# Patient Record
Sex: Male | Born: 1965 | Race: Black or African American | Hispanic: No | Marital: Married | Smoking: Never smoker
Health system: Southern US, Community
[De-identification: ages and names within clinical notes are randomized; demographics above are authoritative.]

## PROBLEM LIST (undated history)

## (undated) DIAGNOSIS — R0602 Shortness of breath: Secondary | ICD-10-CM

## (undated) DIAGNOSIS — I4430 Unspecified atrioventricular block: Secondary | ICD-10-CM

## (undated) DIAGNOSIS — M1990 Unspecified osteoarthritis, unspecified site: Secondary | ICD-10-CM

## (undated) DIAGNOSIS — Z87442 Personal history of urinary calculi: Secondary | ICD-10-CM

## (undated) DIAGNOSIS — G4733 Obstructive sleep apnea (adult) (pediatric): Secondary | ICD-10-CM

## (undated) DIAGNOSIS — K219 Gastro-esophageal reflux disease without esophagitis: Secondary | ICD-10-CM

## (undated) DIAGNOSIS — Z9989 Dependence on other enabling machines and devices: Secondary | ICD-10-CM

## (undated) DIAGNOSIS — I480 Paroxysmal atrial fibrillation: Secondary | ICD-10-CM

## (undated) DIAGNOSIS — E119 Type 2 diabetes mellitus without complications: Secondary | ICD-10-CM

## (undated) DIAGNOSIS — I10 Essential (primary) hypertension: Secondary | ICD-10-CM

## (undated) DIAGNOSIS — R079 Chest pain, unspecified: Secondary | ICD-10-CM

## (undated) DIAGNOSIS — Z8739 Personal history of other diseases of the musculoskeletal system and connective tissue: Secondary | ICD-10-CM

## (undated) DIAGNOSIS — N529 Male erectile dysfunction, unspecified: Secondary | ICD-10-CM

## (undated) DIAGNOSIS — I2510 Atherosclerotic heart disease of native coronary artery without angina pectoris: Secondary | ICD-10-CM

## (undated) DIAGNOSIS — E669 Obesity, unspecified: Secondary | ICD-10-CM

## (undated) DIAGNOSIS — E785 Hyperlipidemia, unspecified: Secondary | ICD-10-CM

## (undated) DIAGNOSIS — I214 Non-ST elevation (NSTEMI) myocardial infarction: Secondary | ICD-10-CM

## (undated) HISTORY — DX: Male erectile dysfunction, unspecified: N52.9

## (undated) HISTORY — PX: INGUINAL HERNIA REPAIR: SUR1180

## (undated) HISTORY — DX: Type 2 diabetes mellitus without complications: E11.9

## (undated) HISTORY — DX: Atherosclerotic heart disease of native coronary artery without angina pectoris: I25.10

## (undated) HISTORY — DX: Hyperlipidemia, unspecified: E78.5

## (undated) HISTORY — DX: Unspecified atrioventricular block: I44.30

## (undated) HISTORY — DX: Non-ST elevation (NSTEMI) myocardial infarction: I21.4

## (undated) HISTORY — DX: Chest pain, unspecified: R07.9

## (undated) HISTORY — PX: KNEE ARTHROSCOPY: SHX127

## (undated) HISTORY — DX: Essential (primary) hypertension: I10

## (undated) HISTORY — DX: Obesity, unspecified: E66.9

---
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
## 1998-10-28 ENCOUNTER — Emergency Department (HOSPITAL_COMMUNITY): Admission: EM | Admit: 1998-10-28 | Discharge: 1998-10-28 | Payer: Self-pay | Admitting: Emergency Medicine

## 1998-10-28 ENCOUNTER — Encounter: Payer: Self-pay | Admitting: Emergency Medicine

## 1998-10-31 ENCOUNTER — Ambulatory Visit (HOSPITAL_COMMUNITY): Admission: RE | Admit: 1998-10-31 | Discharge: 1998-10-31 | Payer: Self-pay | Admitting: Emergency Medicine

## 1998-10-31 ENCOUNTER — Encounter: Payer: Self-pay | Admitting: Emergency Medicine

## 1998-12-03 ENCOUNTER — Encounter: Payer: Self-pay | Admitting: Family Medicine

## 1998-12-03 ENCOUNTER — Ambulatory Visit (HOSPITAL_COMMUNITY): Admission: RE | Admit: 1998-12-03 | Discharge: 1998-12-03 | Payer: Self-pay | Admitting: Family Medicine

## 2000-04-13 ENCOUNTER — Encounter: Payer: Self-pay | Admitting: Family Medicine

## 2000-04-13 ENCOUNTER — Ambulatory Visit (HOSPITAL_COMMUNITY): Admission: RE | Admit: 2000-04-13 | Discharge: 2000-04-13 | Payer: Self-pay | Admitting: Family Medicine

## 2001-05-12 ENCOUNTER — Observation Stay (HOSPITAL_COMMUNITY): Admission: EM | Admit: 2001-05-12 | Discharge: 2001-05-13 | Payer: Self-pay | Admitting: Emergency Medicine

## 2001-05-12 ENCOUNTER — Encounter: Payer: Self-pay | Admitting: Emergency Medicine

## 2001-07-01 ENCOUNTER — Emergency Department (HOSPITAL_COMMUNITY): Admission: EM | Admit: 2001-07-01 | Discharge: 2001-07-01 | Payer: Self-pay | Admitting: *Deleted

## 2003-01-16 ENCOUNTER — Ambulatory Visit (HOSPITAL_BASED_OUTPATIENT_CLINIC_OR_DEPARTMENT_OTHER): Admission: RE | Admit: 2003-01-16 | Discharge: 2003-01-16 | Payer: Self-pay | Admitting: Pulmonary Disease

## 2003-10-27 ENCOUNTER — Ambulatory Visit (HOSPITAL_COMMUNITY): Admission: RE | Admit: 2003-10-27 | Discharge: 2003-10-27 | Payer: Self-pay | Admitting: Family Medicine

## 2003-12-23 ENCOUNTER — Observation Stay (HOSPITAL_COMMUNITY): Admission: RE | Admit: 2003-12-23 | Discharge: 2003-12-24 | Payer: Self-pay | Admitting: Orthopedic Surgery

## 2004-10-28 ENCOUNTER — Ambulatory Visit: Payer: Self-pay | Admitting: Family Medicine

## 2004-10-28 ENCOUNTER — Ambulatory Visit: Payer: Self-pay | Admitting: Cardiology

## 2004-10-29 ENCOUNTER — Inpatient Hospital Stay (HOSPITAL_COMMUNITY): Admission: EM | Admit: 2004-10-29 | Discharge: 2004-10-29 | Payer: Self-pay | Admitting: Emergency Medicine

## 2004-11-01 ENCOUNTER — Ambulatory Visit: Payer: Self-pay | Admitting: Cardiology

## 2004-11-10 ENCOUNTER — Ambulatory Visit: Payer: Self-pay | Admitting: Internal Medicine

## 2004-11-15 ENCOUNTER — Ambulatory Visit: Payer: Self-pay

## 2004-11-15 ENCOUNTER — Ambulatory Visit: Payer: Self-pay | Admitting: Cardiology

## 2004-11-17 ENCOUNTER — Ambulatory Visit: Payer: Self-pay | Admitting: Internal Medicine

## 2004-11-24 ENCOUNTER — Ambulatory Visit: Payer: Self-pay | Admitting: Internal Medicine

## 2004-11-29 ENCOUNTER — Ambulatory Visit: Payer: Self-pay | Admitting: Cardiology

## 2004-12-05 ENCOUNTER — Ambulatory Visit: Payer: Self-pay | Admitting: Cardiology

## 2005-02-15 ENCOUNTER — Ambulatory Visit: Payer: Self-pay | Admitting: Cardiology

## 2005-03-09 ENCOUNTER — Ambulatory Visit: Payer: Self-pay | Admitting: Internal Medicine

## 2005-05-09 ENCOUNTER — Ambulatory Visit: Payer: Self-pay | Admitting: Family Medicine

## 2005-07-13 ENCOUNTER — Ambulatory Visit: Payer: Self-pay | Admitting: Family Medicine

## 2005-08-15 ENCOUNTER — Ambulatory Visit: Payer: Self-pay | Admitting: Cardiology

## 2005-09-03 ENCOUNTER — Emergency Department (HOSPITAL_COMMUNITY): Admission: EM | Admit: 2005-09-03 | Discharge: 2005-09-03 | Payer: Self-pay | Admitting: Family Medicine

## 2005-10-03 ENCOUNTER — Ambulatory Visit: Payer: Self-pay | Admitting: Family Medicine

## 2006-10-17 ENCOUNTER — Ambulatory Visit: Payer: Self-pay | Admitting: Family Medicine

## 2006-10-17 DIAGNOSIS — E785 Hyperlipidemia, unspecified: Secondary | ICD-10-CM

## 2006-10-17 DIAGNOSIS — E119 Type 2 diabetes mellitus without complications: Secondary | ICD-10-CM

## 2006-10-17 DIAGNOSIS — I10 Essential (primary) hypertension: Secondary | ICD-10-CM

## 2006-10-17 DIAGNOSIS — E669 Obesity, unspecified: Secondary | ICD-10-CM

## 2006-10-17 HISTORY — DX: Essential (primary) hypertension: I10

## 2006-10-17 HISTORY — DX: Obesity, unspecified: E66.9

## 2006-10-17 HISTORY — DX: Hyperlipidemia, unspecified: E78.5

## 2006-10-17 HISTORY — DX: Type 2 diabetes mellitus without complications: E11.9

## 2006-10-17 LAB — CONVERTED CEMR LAB
ALT: 24 units/L (ref 0–53)
AST: 22 units/L (ref 0–37)
Albumin: 4.2 g/dL (ref 3.5–5.2)
Alkaline Phosphatase: 67 units/L (ref 39–117)
BUN: 13 mg/dL (ref 6–23)
Basophils Absolute: 0 10*3/uL (ref 0.0–0.1)
Basophils Relative: 0.5 % (ref 0.0–1.0)
Bilirubin, Direct: 0.1 mg/dL (ref 0.0–0.3)
CO2: 31 meq/L (ref 19–32)
Calcium: 9.2 mg/dL (ref 8.4–10.5)
Chloride: 102 meq/L (ref 96–112)
Cholesterol: 182 mg/dL (ref 0–200)
Creatinine, Ser: 0.8 mg/dL (ref 0.4–1.5)
Eosinophils Absolute: 0.3 10*3/uL (ref 0.0–0.6)
Eosinophils Relative: 4.5 % (ref 0.0–5.0)
GFR calc Af Amer: 138 mL/min
GFR calc non Af Amer: 114 mL/min
Glucose, Bld: 138 mg/dL — ABNORMAL HIGH (ref 70–99)
HCT: 40.5 % (ref 39.0–52.0)
HDL: 33.8 mg/dL — ABNORMAL LOW (ref >39.0)
Hemoglobin: 13.7 g/dL (ref 13.0–17.0)
Hgb A1c MFr Bld: 7.3 % — ABNORMAL HIGH (ref 4.6–6.0)
LDL Cholesterol: 125 mg/dL — ABNORMAL HIGH (ref 0–99)
Lymphocytes Relative: 38 % (ref 12.0–46.0)
MCHC: 33.8 g/dL (ref 30.0–36.0)
MCV: 80.2 fL (ref 78.0–100.0)
Monocytes Absolute: 0.7 10*3/uL (ref 0.2–0.7)
Monocytes Relative: 10.2 % (ref 3.0–11.0)
Neutro Abs: 3.1 10*3/uL (ref 1.4–7.7)
Neutrophils Relative %: 46.8 % (ref 43.0–77.0)
Platelets: 272 10*3/uL (ref 150–400)
Potassium: 3.7 meq/L (ref 3.5–5.1)
RBC: 5.05 M/uL (ref 4.22–5.81)
RDW: 14.3 % (ref 11.5–14.6)
Sodium: 140 meq/L (ref 135–145)
TSH: 1.25 microintl units/mL (ref 0.35–5.50)
Total Bilirubin: 0.6 mg/dL (ref 0.3–1.2)
Total CHOL/HDL Ratio: 5.4
Total Protein: 7.4 g/dL (ref 6.0–8.3)
Triglycerides: 117 mg/dL (ref 0–149)
VLDL: 23 mg/dL (ref 0–40)
WBC: 6.6 10*3/uL (ref 4.5–10.5)

## 2006-11-27 ENCOUNTER — Ambulatory Visit: Payer: Self-pay | Admitting: Family Medicine

## 2007-07-25 ENCOUNTER — Emergency Department (HOSPITAL_COMMUNITY): Admission: EM | Admit: 2007-07-25 | Discharge: 2007-07-25 | Payer: Self-pay | Admitting: Emergency Medicine

## 2007-11-04 ENCOUNTER — Telehealth: Payer: Self-pay | Admitting: Family Medicine

## 2007-11-26 ENCOUNTER — Ambulatory Visit: Payer: Self-pay | Admitting: Family Medicine

## 2007-11-26 LAB — CONVERTED CEMR LAB
ALT: 29 units/L (ref 0–53)
AST: 23 units/L (ref 0–37)
Albumin: 4.1 g/dL (ref 3.5–5.2)
Alkaline Phosphatase: 65 units/L (ref 39–117)
BUN: 10 mg/dL (ref 6–23)
Basophils Absolute: 0.1 10*3/uL (ref 0.0–0.1)
Basophils Relative: 1.4 % (ref 0.0–3.0)
Bilirubin, Direct: 0.1 mg/dL (ref 0.0–0.3)
CO2: 31 meq/L (ref 19–32)
Calcium: 9 mg/dL (ref 8.4–10.5)
Chloride: 106 meq/L (ref 96–112)
Creatinine, Ser: 0.7 mg/dL (ref 0.4–1.5)
Eosinophils Absolute: 0.2 10*3/uL (ref 0.0–0.7)
Eosinophils Relative: 4 % (ref 0.0–5.0)
GFR calc Af Amer: 161 mL/min
GFR calc non Af Amer: 133 mL/min
Glucose, Bld: 153 mg/dL — ABNORMAL HIGH (ref 70–99)
HCT: 39 % (ref 39.0–52.0)
Hemoglobin: 13.3 g/dL (ref 13.0–17.0)
Hgb A1c MFr Bld: 8.8 % — ABNORMAL HIGH (ref 4.6–6.0)
Lymphocytes Relative: 31.4 % (ref 12.0–46.0)
MCHC: 34.1 g/dL (ref 30.0–36.0)
MCV: 82.2 fL (ref 78.0–100.0)
Monocytes Absolute: 0.5 10*3/uL (ref 0.1–1.0)
Monocytes Relative: 8.2 % (ref 3.0–12.0)
Neutro Abs: 3.1 10*3/uL (ref 1.4–7.7)
Neutrophils Relative %: 55 % (ref 43.0–77.0)
Platelets: 280 10*3/uL (ref 150–400)
Potassium: 3.5 meq/L (ref 3.5–5.1)
RBC: 4.74 M/uL (ref 4.22–5.81)
RDW: 14 % (ref 11.5–14.6)
Sodium: 142 meq/L (ref 135–145)
TSH: 1.39 microintl units/mL (ref 0.35–5.50)
Total Bilirubin: 0.7 mg/dL (ref 0.3–1.2)
Total Protein: 7.3 g/dL (ref 6.0–8.3)
WBC: 5.7 10*3/uL (ref 4.5–10.5)

## 2007-11-29 ENCOUNTER — Telehealth: Payer: Self-pay | Admitting: Family Medicine

## 2007-12-06 ENCOUNTER — Ambulatory Visit: Payer: Self-pay | Admitting: Family Medicine

## 2008-01-03 ENCOUNTER — Ambulatory Visit: Payer: Self-pay | Admitting: Family Medicine

## 2008-01-03 LAB — CONVERTED CEMR LAB
BUN: 16 mg/dL (ref 6–23)
CO2: 31 meq/L (ref 19–32)
Calcium: 9.1 mg/dL (ref 8.4–10.5)
Chloride: 104 meq/L (ref 96–112)
Creatinine, Ser: 0.9 mg/dL (ref 0.4–1.5)
Creatinine,U: 61.5 mg/dL
GFR calc Af Amer: 120 mL/min
GFR calc non Af Amer: 99 mL/min
Glucose, Bld: 111 mg/dL — ABNORMAL HIGH (ref 70–99)
Hgb A1c MFr Bld: 8.2 % — ABNORMAL HIGH (ref 4.6–6.0)
Microalb Creat Ratio: 27.6 mg/g (ref 0.0–30.0)
Microalb, Ur: 1.7 mg/dL (ref 0.0–1.9)
Potassium: 3.8 meq/L (ref 3.5–5.1)
Sodium: 143 meq/L (ref 135–145)

## 2008-01-07 ENCOUNTER — Telehealth: Payer: Self-pay | Admitting: *Deleted

## 2008-01-13 ENCOUNTER — Telehealth: Payer: Self-pay | Admitting: Family Medicine

## 2008-02-10 ENCOUNTER — Telehealth: Payer: Self-pay | Admitting: Family Medicine

## 2008-03-12 DIAGNOSIS — R071 Chest pain on breathing: Secondary | ICD-10-CM | POA: Insufficient documentation

## 2008-03-26 ENCOUNTER — Ambulatory Visit: Payer: Self-pay | Admitting: Family Medicine

## 2008-03-26 LAB — CONVERTED CEMR LAB: Hgb A1c MFr Bld: 7.5 % — ABNORMAL HIGH (ref 4.6–6.0)

## 2008-03-27 ENCOUNTER — Telehealth: Payer: Self-pay | Admitting: Family Medicine

## 2008-04-01 DIAGNOSIS — R197 Diarrhea, unspecified: Secondary | ICD-10-CM | POA: Insufficient documentation

## 2008-04-07 ENCOUNTER — Ambulatory Visit: Payer: Self-pay | Admitting: Family Medicine

## 2008-04-13 ENCOUNTER — Telehealth: Payer: Self-pay | Admitting: Family Medicine

## 2008-05-11 ENCOUNTER — Telehealth: Payer: Self-pay | Admitting: Family Medicine

## 2008-08-05 ENCOUNTER — Telehealth: Payer: Self-pay | Admitting: Family Medicine

## 2008-10-05 ENCOUNTER — Encounter: Payer: Self-pay | Admitting: Family Medicine

## 2008-12-13 LAB — HM DIABETES FOOT EXAM: HM Diabetic Foot Exam: NORMAL

## 2008-12-17 ENCOUNTER — Ambulatory Visit: Payer: Self-pay | Admitting: Family Medicine

## 2008-12-17 LAB — CONVERTED CEMR LAB
ALT: 21 units/L (ref 0–53)
AST: 22 units/L (ref 0–37)
Albumin: 4.4 g/dL (ref 3.5–5.2)
Alkaline Phosphatase: 58 units/L (ref 39–117)
BUN: 13 mg/dL (ref 6–23)
Basophils Absolute: 0.1 10*3/uL (ref 0.0–0.1)
Basophils Relative: 1.1 % (ref 0.0–3.0)
Bilirubin, Direct: 0.1 mg/dL (ref 0.0–0.3)
CO2: 22 meq/L (ref 19–32)
Calcium: 9.5 mg/dL (ref 8.4–10.5)
Chloride: 100 meq/L (ref 96–112)
Cholesterol: 161 mg/dL (ref 0–200)
Creatinine, Ser: 0.9 mg/dL (ref 0.4–1.5)
Creatinine,U: 318.1 mg/dL
Eosinophils Absolute: 0.3 10*3/uL (ref 0.0–0.7)
Eosinophils Relative: 5.8 % — ABNORMAL HIGH (ref 0.0–5.0)
GFR calc non Af Amer: 119.07 mL/min (ref >60)
Glucose, Bld: 87 mg/dL (ref 70–99)
HCT: 40.9 % (ref 39.0–52.0)
HDL: 28.9 mg/dL — ABNORMAL LOW (ref >39.00)
Hemoglobin: 13.8 g/dL (ref 13.0–17.0)
Hgb A1c MFr Bld: 6.7 % — ABNORMAL HIGH (ref 4.6–6.5)
LDL Cholesterol: 108 mg/dL — ABNORMAL HIGH (ref 0–99)
Lymphocytes Relative: 35.6 % (ref 12.0–46.0)
Lymphs Abs: 2 10*3/uL (ref 0.7–4.0)
MCHC: 33.7 g/dL (ref 30.0–36.0)
MCV: 83.3 fL (ref 78.0–100.0)
Microalb Creat Ratio: 18.2 mg/g (ref 0.0–30.0)
Microalb, Ur: 5.8 mg/dL — ABNORMAL HIGH (ref 0.0–1.9)
Monocytes Absolute: 0.5 10*3/uL (ref 0.1–1.0)
Monocytes Relative: 9.5 % (ref 3.0–12.0)
Neutro Abs: 2.6 10*3/uL (ref 1.4–7.7)
Neutrophils Relative %: 48 % (ref 43.0–77.0)
Platelets: 275 10*3/uL (ref 150.0–400.0)
Potassium: 4.5 meq/L (ref 3.5–5.1)
RBC: 4.91 M/uL (ref 4.22–5.81)
RDW: 14 % (ref 11.5–14.6)
Sodium: 137 meq/L (ref 135–145)
TSH: 1.18 microintl units/mL (ref 0.35–5.50)
Total Bilirubin: 0.5 mg/dL (ref 0.3–1.2)
Total CHOL/HDL Ratio: 6
Total Protein: 7 g/dL (ref 6.0–8.3)
Triglycerides: 123 mg/dL (ref 0.0–149.0)
VLDL: 24.6 mg/dL (ref 0.0–40.0)
WBC: 5.5 10*3/uL (ref 4.5–10.5)

## 2008-12-23 ENCOUNTER — Telehealth: Payer: Self-pay | Admitting: Family Medicine

## 2009-01-21 DIAGNOSIS — R059 Cough, unspecified: Secondary | ICD-10-CM | POA: Insufficient documentation

## 2009-01-21 DIAGNOSIS — R05 Cough: Secondary | ICD-10-CM

## 2009-03-04 ENCOUNTER — Emergency Department (HOSPITAL_COMMUNITY): Admission: EM | Admit: 2009-03-04 | Discharge: 2009-03-04 | Payer: Self-pay | Admitting: Emergency Medicine

## 2009-03-10 ENCOUNTER — Telehealth: Payer: Self-pay | Admitting: Family Medicine

## 2009-10-11 ENCOUNTER — Ambulatory Visit: Payer: Self-pay | Admitting: Family Medicine

## 2009-10-11 DIAGNOSIS — J309 Allergic rhinitis, unspecified: Secondary | ICD-10-CM | POA: Insufficient documentation

## 2009-10-11 DIAGNOSIS — J019 Acute sinusitis, unspecified: Secondary | ICD-10-CM | POA: Insufficient documentation

## 2009-12-08 ENCOUNTER — Ambulatory Visit: Payer: Self-pay | Admitting: Family Medicine

## 2009-12-08 LAB — CONVERTED CEMR LAB
ALT: 20 units/L (ref 0–53)
AST: 20 units/L (ref 0–37)
Albumin: 4.1 g/dL (ref 3.5–5.2)
Alkaline Phosphatase: 57 units/L (ref 39–117)
BUN: 15 mg/dL (ref 6–23)
Basophils Absolute: 0 10*3/uL (ref 0.0–0.1)
Basophils Relative: 0.7 % (ref 0.0–3.0)
Bilirubin Urine: NEGATIVE
Bilirubin, Direct: 0.1 mg/dL (ref 0.0–0.3)
CO2: 28 meq/L (ref 19–32)
Calcium: 9.2 mg/dL (ref 8.4–10.5)
Chloride: 105 meq/L (ref 96–112)
Cholesterol: 183 mg/dL (ref 0–200)
Creatinine, Ser: 0.8 mg/dL (ref 0.4–1.5)
Creatinine,U: 193.2 mg/dL
Eosinophils Absolute: 0.3 10*3/uL (ref 0.0–0.7)
Eosinophils Relative: 5.9 % — ABNORMAL HIGH (ref 0.0–5.0)
GFR calc non Af Amer: 130.13 mL/min (ref >60)
Glucose, Bld: 109 mg/dL — ABNORMAL HIGH (ref 70–99)
Glucose, Urine, Semiquant: NEGATIVE
HCT: 41 % (ref 39.0–52.0)
HDL: 39.3 mg/dL (ref >39.00)
Hemoglobin: 13.7 g/dL (ref 13.0–17.0)
Hgb A1c MFr Bld: 6.6 % — ABNORMAL HIGH (ref 4.6–6.5)
Ketones, urine, test strip: NEGATIVE
LDL Cholesterol: 130 mg/dL — ABNORMAL HIGH (ref 0–99)
Lymphocytes Relative: 37.8 % (ref 12.0–46.0)
Lymphs Abs: 2 10*3/uL (ref 0.7–4.0)
MCHC: 33.5 g/dL (ref 30.0–36.0)
MCV: 82.5 fL (ref 78.0–100.0)
Microalb Creat Ratio: 1 mg/g (ref 0.0–30.0)
Microalb, Ur: 1.9 mg/dL (ref 0.0–1.9)
Monocytes Absolute: 0.5 10*3/uL (ref 0.1–1.0)
Monocytes Relative: 10.2 % (ref 3.0–12.0)
Neutro Abs: 2.4 10*3/uL (ref 1.4–7.7)
Neutrophils Relative %: 45.4 % (ref 43.0–77.0)
Nitrite: NEGATIVE
PSA: 0.19 ng/mL (ref 0.10–4.00)
Platelets: 276 10*3/uL (ref 150.0–400.0)
Potassium: 4.2 meq/L (ref 3.5–5.1)
Protein, U semiquant: NEGATIVE
RBC: 4.97 M/uL (ref 4.22–5.81)
RDW: 15 % — ABNORMAL HIGH (ref 11.5–14.6)
Sodium: 140 meq/L (ref 135–145)
Specific Gravity, Urine: 1.02
TSH: 1.53 microintl units/mL (ref 0.35–5.50)
Total Bilirubin: 0.3 mg/dL (ref 0.3–1.2)
Total CHOL/HDL Ratio: 5
Total Protein: 7.4 g/dL (ref 6.0–8.3)
Triglycerides: 69 mg/dL (ref 0.0–149.0)
Urobilinogen, UA: 0.2
VLDL: 13.8 mg/dL (ref 0.0–40.0)
WBC Urine, dipstick: NEGATIVE
WBC: 5.3 10*3/uL (ref 4.5–10.5)
pH: 7

## 2009-12-11 LAB — HM DIABETES EYE EXAM

## 2009-12-13 ENCOUNTER — Ambulatory Visit: Payer: Self-pay | Admitting: Family Medicine

## 2009-12-13 ENCOUNTER — Encounter: Payer: Self-pay | Admitting: Family Medicine

## 2009-12-27 ENCOUNTER — Ambulatory Visit: Payer: Self-pay | Admitting: Family Medicine

## 2009-12-27 DIAGNOSIS — M25569 Pain in unspecified knee: Secondary | ICD-10-CM

## 2010-03-19 ENCOUNTER — Ambulatory Visit
Admission: RE | Admit: 2010-03-19 | Discharge: 2010-03-19 | Payer: Self-pay | Source: Home / Self Care | Attending: Family Medicine | Admitting: Family Medicine

## 2010-04-12 NOTE — Progress Notes (Signed)
  Phone Note Outgoing Call   Call placed by: jat Call placed to: Patient Summary of Call: called Raymond Lutz advised in his hemoglobin A1c is coming down, but not normal yet.  Continue the intensive diet, exercise program return in 3 months for nonfasting hemoglobin A1C Initial call taken by: Roderick Pee MD,  March 27, 2008 5:36 PM

## 2010-04-12 NOTE — Progress Notes (Signed)
Summary: simvastatin  Phone Note Refill Request   Refills Requested: Medication #1:  SIMVASTATIN 80 MG  TABS one daily Initial call taken by: Kern Reap CMA,  Aug 05, 2008 12:04 PM      Prescriptions: SIMVASTATIN 80 MG  TABS (SIMVASTATIN) one daily  #100 x 1   Entered by:   Kern Reap CMA   Authorized by:   Roderick Pee MD   Signed by:   Kern Reap CMA on 08/05/2008   Method used:   Electronically to        [facility]  [phone]* (retail)       [address]       [place], [place]  [address]       Ph: [phone] or [phone]       Fax: [phone]   RxID:   [id number]

## 2010-04-12 NOTE — Progress Notes (Signed)
Summary: diltiazem refill  Phone Note Refill Request Message from:  Fax from Pharmacy  Refills Requested: Medication #1:  DILTIAZEM HCL ER BEADS 420 MG  CP24 Take one by mouth daily Initial call taken by: Kern Reap CMA,  February 10, 2008 2:11 PM      Prescriptions: DILTIAZEM HCL ER BEADS 420 MG  CP24 (DILTIAZEM HCL ER BEADS) Take one by mouth daily  #100 x 0   Entered by:   Kern Reap CMA   Authorized by:   Roderick Pee MD   Signed by:   Kern Reap CMA on 02/10/2008   Method used:   Electronically to        [facility]  [phone]* (retail)       [address]       [place], [place]  [address]       Ph: [phone] or [phone]       Fax: [phone]   RxID:   [id number]

## 2010-04-12 NOTE — Assessment & Plan Note (Signed)
Summary: KNOT IN CHEST/JLS   Vital Signs:  Patient Profile:   45 Years Old Male Height:     74 inches (187.96 cm) Temp:     98.2 degrees F oral Pulse rate:   66 / minute Pulse rhythm:   regular BP sitting:   160 / 88  (left arm) Cuff size:   large  Vitals Entered By: Kern Reap CMA (March 26, 2008 2:36 PM) Weight > 350 lbs. Yes                 Chief Complaint:  lump on chest.  History of Present Illness: Raymond Lutz is a 45 year old male, who comes in today for evaluation of tenderness in his xiphoid process.  Two weeks ago he noticed some tenderness there no trauma.  Is otherwise been well.  He reports his weight is now under 400 pounds.  He continues to follow his diet and exercise program.  Blood pressure from 137/80.  Blood sugar fasting 115    Prior Medication List:  FUROSEMIDE 40 MG  TABS (FUROSEMIDE) 2 by mouth qam SIMVASTATIN 80 MG  TABS (SIMVASTATIN) one daily POTASSIUM CHLORIDE CRYS CR 20 MEQ  TBCR (POTASSIUM CHLORIDE CRYS CR) 2 by mouth qam DILTIAZEM HCL ER BEADS 420 MG  CP24 (DILTIAZEM HCL ER BEADS) Take one by mouth daily CIALIS 20 MG  TABS (TADALAFIL) prn VENTOLIN HFA 108 (90 BASE) MCG/ACT  AERS (ALBUTEROL SULFATE) 2 ps three times a day prn GLUCOPHAGE 500 MG TABS (METFORMIN HCL) Take 1 tablet by mouth two times a day ACCU-CHEK COMPACT TEST DRUM  STRP (GLUCOSE BLOOD) test 3 times a week   Current Allergies: No known allergies   Past Medical History:    Reviewed history from 01/03/2008 and no changes required:       Diabetes mellitus, type II       Hyperlipidemia       Hypertension       ASTHMA, CHILDHOOD (ICD-493.00)       OBESITY (ICD-278.00)       Peripheral vascular disease       ED   Social History:    Reviewed history from 12/06/2007 and no changes required:       Occupation:       Married       Never Smoked       Alcohol use-no       Drug use-no       Regular exercise-yes    Review of Systems      See HPI   Physical  Exam  General:     Well-developed,well-nourished,in no acute distress; alert,appropriate and cooperative throughout examination Chest Wall:     tender xiphoid process    Impression & Recommendations:  Problem # 1:  CHEST WALL PAIN, ACUTE (JAS-505.39) Assessment: New  Complete Medication List: 1)  Furosemide 40 Mg Tabs (Furosemide) .... 2 by mouth qam 2)  Simvastatin 80 Mg Tabs (Simvastatin) .... One daily 3)  Potassium Chloride Crys Cr 20 Meq Tbcr (Potassium chloride crys cr) .... 2 by mouth qam 4)  Diltiazem Hcl Er Beads 420 Mg Cp24 (Diltiazem hcl er beads) .... Take one by mouth daily 5)  Cialis 20 Mg Tabs (Tadalafil) .... Prn 6)  Ventolin Hfa 108 (90 Base) Mcg/act Aers (Albuterol sulfate) .... 2 ps three times a day prn 7)  Glucophage 500 Mg Tabs (Metformin hcl) .... Take 1 tablet by mouth two times a day 8)  Accu-chek Compact Test Drum Strp (Glucose blood) .... Test  3 times a week  Other Orders: Venipuncture ([address]) TLB-A1C / Hgb A1C (Glycohemoglobin) (83036-A1C)   Patient Instructions: 1)  take 600 mg of Motrin up to 3 times a day with food as needed for the chest wall pain.  Return p.r.n.

## 2010-04-12 NOTE — Assessment & Plan Note (Signed)
Summary: cpx//ccm   Vital Signs:  Patient profile:   45 year old male Height:      72 inches Weight:      365 pounds BMI:     49.68 Temp:     98.4 degrees F oral BP sitting:   130 / 84  (left arm) Cuff size:   large  Vitals Entered By: Kern Reap CMA Duncan Dull) (December 13, 2009 2:36 PM)  CC: cpx Is Patient Diabetic? Yes Did you bring your meter with you today? No Pain Assessment Patient in pain? no        CC:  cpx.  History of Present Illness: Raymond Lutz is a 45 year old, married male, nonsmoker, who comes in today for evaluation of hypertension, hyperlipidemia, diabetes, occasional asthma, and erectile dysfunction.  His hypertension is treated with Cardizem 420 mg daily, Lasix, 80 mg q.a.m., BP 130/84.  His hyperlipidemia, history of Lipitor 20 mg nightly, Lopressor ago.  His diabetes is to use Glucophage 500 mg b.i.d. blood sugar 109.  A1c6 .6%.  His erectile dysfunction is treated with Cialis 20 mg p.r.n. with good results.  He only used an occasional Ventolin as needed and he takes a potassium supplement two tabs daily potassium level normal 4.2.  He gets routine eye care, dental care, tetanus, 2004, Pneumovax 2005.  Pneumothorax in seasonal flu shot today.  He is not an ACE inhibitor because he developed a cough  Allergies: No Known Drug Allergies  Past History:  Past medical, surgical, family and social histories (including risk factors) reviewed, and no changes noted (except as noted below).  Past Medical History: Reviewed history from 01/03/2008 and no changes required. Diabetes mellitus, type II Hyperlipidemia Hypertension ASTHMA, CHILDHOOD (ICD-493.00) OBESITY (ICD-278.00) Peripheral vascular disease ED  Past Surgical History: Reviewed history from 10/17/2006 and no changes required. Hernia Repair  Family History: Reviewed history from 12/06/2007 and no changes required. Family History Diabetes 1st degree relative Family History  Hypertension  Social History: Reviewed history from 12/06/2007 and no changes required. Occupation: Married Never Smoked Alcohol use-no Drug use-no Regular exercise-yes  Review of Systems      See HPI  Physical Exam  General:  Well-developed,well-nourished,in no acute distress; alert,appropriate and cooperative throughout examination Head:  Normocephalic and atraumatic without obvious abnormalities. No apparent alopecia or balding. Eyes:  exam normal, except for bilateral cataracts Ears:  External ear exam shows no significant lesions or deformities.  Otoscopic examination reveals clear canals, tympanic membranes are intact bilaterally without bulging, retraction, inflammation or discharge. Hearing is grossly normal bilaterally. Nose:  External nasal examination shows no deformity or inflammation. Nasal mucosa are pink and moist without lesions or exudates. Mouth:  Oral mucosa and oropharynx without lesions or exudates.  Teeth in good repair. Neck:  No deformities, masses, or tenderness noted. Chest Wall:  No deformities, masses, tenderness or gynecomastia noted. Breasts:  No masses or gynecomastia noted Lungs:  Normal respiratory effort, chest expands symmetrically. Lungs are clear to auscultation, no crackles or wheezes. Heart:  Normal rate and regular rhythm. S1 and S2 normal without gallop, murmur, click, rub or other extra sounds. Msk:  No deformity or scoliosis noted of thoracic or lumbar spine.   Pulses:  R and L carotid,radial,femoral,dorsalis pedis and posterior tibial pulses are full and equal bilaterally Extremities:  No clubbing, cyanosis, edema, or deformity noted with normal full range of motion of all joints.   Neurologic:  No cranial nerve deficits noted. Station and gait are normal. Plantar reflexes are down-going bilaterally.  DTRs are symmetrical throughout. Sensory, motor and coordinative functions appear intact. Skin:  Intact without suspicious lesions or  rashes Cervical Nodes:  No lymphadenopathy noted Axillary Nodes:  No palpable lymphadenopathy Inguinal Nodes:  No significant adenopathy Psych:  Cognition and judgment appear intact. Alert and cooperative with normal attention span and concentration. No apparent delusions, illusions, hallucinations  Diabetes Management Exam:    Foot Exam (with socks and/or shoes not present):       Sensory-Pinprick/Light touch:          Left medial foot (L-4): normal          Left dorsal foot (L-5): normal          Left lateral foot (S-1): normal          Right medial foot (L-4): normal          Right dorsal foot (L-5): normal          Right lateral foot (S-1): normal       Sensory-Monofilament:          Left foot: normal          Right foot: normal       Inspection:          Left foot: normal          Right foot: normal       Nails:          Left foot: normal          Right foot: normal    Eye Exam:       Eye Exam done elsewhere          Date: 12/11/2008          Results: normal          Done by: [facility]   Impression & Recommendations:  Problem # 1:  OBESITY (ICD-278.00) Assessment Unchanged  Orders: Prescription Created Electronically [phone])  Problem # 2:  HYPERTENSION (ICD-401.9) Assessment: Improved  His updated medication list for this problem includes:    Furosemide 40 Mg Tabs (Furosemide) .Marland Kitchen... 2 by mouth qam    Diltiazem Hcl Er Beads 420 Mg Cp24 (Diltiazem hcl er beads) .Marland Kitchen... Take one by mouth daily  Orders: Prescription Created Electronically [phone]) EKG w/ Interpretation (93000)  Problem # 3:  HYPERLIPIDEMIA (ICD-272.4) Assessment: Improved  His updated medication list for this problem includes:    Lipitor 20 Mg Tabs (Atorvastatin calcium) .Marland Kitchen... 1 tab @ bedtime  Orders: Prescription Created Electronically [id number]) EKG w/ Interpretation ([address])  Problem # 4:  DIABETES MELLITUS, TYPE II (ICD-250.00) Assessment: Improved  His updated medication list for  this problem includes:    Glucophage 500 Mg Tabs (Metformin hcl) .Marland Kitchen... Take 1 tablet by mouth two times a day  Orders: Prescription Created Electronically [phone])  Problem # 5:  Preventive Health Care (ICD-V70.0) Assessment: Unchanged  Complete Medication List: 1)  Furosemide 40 Mg Tabs (Furosemide) .... 2 by mouth qam 2)  Potassium Chloride Crys Cr 20 Meq Tbcr (Potassium chloride crys cr) .... 2 by mouth qam 3)  Diltiazem Hcl Er Beads 420 Mg Cp24 (Diltiazem hcl er beads) .... Take one by mouth daily 4)  Cialis 20 Mg Tabs (Tadalafil) .... Prn 5)  Ventolin Hfa 108 (90 Base) Mcg/act Aers (Albuterol sulfate) .... 2 ps three times a day prn 6)  Glucophage 500 Mg Tabs (Metformin hcl) .... Take 1 tablet by mouth two times a day 7)  Accu-chek Compact Test Drum Strp (Glucose blood) .Marland KitchenMarland KitchenMarland Kitchen  Test 3 times a week 8)  Hydromet 5-1.5 Mg/38ml Syrp (Hydrocodone-homatropine) .Marland Kitchen.. 1 or 2 tsp at bedtime as needed 9)  Lipitor 20 Mg Tabs (Atorvastatin calcium) .Marland Kitchen.. 1 tab @ bedtime  Other Orders: Flu Vaccine 64yrs + (14782) Admin 1st Vaccine (95621) Pneumococcal Vaccine (30865) Admin of Any Addtl Vaccine (78469)  Patient Instructions: 1)  see Dr. Mia Creek, ophthalmologist for your next exam. 2)  It is important that you exercise regularly at least 20 minutes 5 times a week. If you develop chest pain, have severe difficulty breathing, or feel very tired , stop exercising immediately and seek medical attention. 3)  You need to lose weight. Consider a lower calorie diet and regular exercise.  4)  Take an Aspirin every day. 5)  Check your blood sugars regularly. If your readings are usually above : or below 70 you should contact our office. 6)  It is important that your Diabetic A1c level is checked every 3 months. 7)  See your eye doctor yearly to check for diabetic eye damage. 8)  Check your feet each night for sore areas, calluses or signs of infection. 9)  Check your Blood Pressure regularly. If it is  above: you should make an appointment. 10)  Please schedule a follow-up appointment in 6 months.,250.00 11)  BMP prior to visit, ICD-9: 12)  HbgA1C prior to visit, ICD-9: Prescriptions: LIPITOR 20 MG TABS (ATORVASTATIN CALCIUM) 1 tab @ bedtime  #100 x 3   Entered and Authorized by:   Roderick Pee MD   Signed by:   Roderick Pee MD on 12/13/2009   Method used:   Electronically to        [facility]  [phone]* (retail)       [address]       [place], [place]  [address]       Ph: [phone] or [phone]       Fax: [phone]   RxID:   [id number] ACCU-CHEK COMPACT TEST DRUM  STRP (GLUCOSE BLOOD) test 3 times a week  #90 x 3   Entered and Authorized by:   Roderick Pee MD   Signed by:   Roderick Pee MD on 12/13/2009   Method used:   Electronically to        [facility]  [phone]* (retail)       [address]       [place], [place]  [address]       Ph: [phone] or [phone]       Fax: [phone]   RxID:   [id number] GLUCOPHAGE 500 MG TABS (METFORMIN HCL) Take 1 tablet by mouth two times a day  #200 Each x 3   Entered and Authorized by:   Roderick Pee MD   Signed by:   Roderick Pee MD on 12/13/2009   Method used:   Electronically to        [facility]  [phone]* (retail)       [address]       [place], [place]  [address]       Ph: [phone] or [phone]       Fax: [phone]   RxID:   [id number] VENTOLIN HFA 108 (90 BASE) MCG/ACT  AERS (ALBUTEROL SULFATE) 2 ps three times a day prn  #1 x 1  Entered and Authorized by:   Roderick Pee MD   Signed by:   Roderick Pee MD on 12/13/2009   Method used:   Electronically to        [facility]  [phone]* (retail)       [address]       [place], [place]  [address]       Ph: [phone] or [phone]       Fax: [phone]   RxID:    [id number] CIALIS 20 MG  TABS (TADALAFIL) prn  #6 Each x 11   Entered and Authorized by:   Roderick Pee MD   Signed by:   Roderick Pee MD on 12/13/2009   Method used:   Electronically to        [facility]  [phone]* (retail)       [address]       [place], [place]  [address]       Ph: [phone] or [phone]       Fax: [phone]   RxID:   [id number] DILTIAZEM HCL ER BEADS 420 MG  CP24 (DILTIAZEM HCL ER BEADS) Take one by mouth daily  #100 x 3   Entered and Authorized by:   Roderick Pee MD   Signed by:   Roderick Pee MD on 12/13/2009   Method used:   Electronically to        [facility]  [phone]* (retail)       [address]       [place], [place]  [address]       Ph: [phone] or [phone]       Fax: [phone]   RxID:   [id number] POTASSIUM CHLORIDE CRYS CR 20 MEQ  TBCR (POTASSIUM CHLORIDE CRYS CR) 2 by mouth qam  #200 Each x 3   Entered and Authorized by:   Roderick Pee MD   Signed by:   Roderick Pee MD on 12/13/2009   Method used:   Electronically to        [facility]  [phone]* (retail)       [address]       [place], [place]  [address]       Ph: [phone] or [phone]       Fax: [phone]   RxID:   [id number] FUROSEMIDE 40 MG  TABS (FUROSEMIDE) 2 by mouth qam  #200 Each x 3   Entered and Authorized by:   Roderick Pee MD   Signed by:   Roderick Pee MD on 12/13/2009   Method used:   Electronically to        [facility]  [phone]* (retail)       [address]       [place], [place]  [address]       Ph: [phone] or [phone]       Fax: [phone]   RxID:   [id number]    Immunizations Administered:  Influenza Vaccine # 1:    Vaccine Type: Fluvax 3+    Site: left deltoid    Mfr: Merck    Dose: 0.5 ml    Route: IM    Given by: Kern Reap CMA  (  AAMA)    Exp. Date: 09/10/2010    Lot #: [id number]    VIS given: 10/05/09 version given December 13, 2009.    Physician counseled: yes  Pneumonia Vaccine:    Vaccine Type: Pneumovax    Site: right deltoid    Mfr: Merck    Dose: 0.5 ml    Route: IM    Given by: Kern Reap CMA (AAMA)    Exp. Date: 05/30/2011    Lot #: [id number]    Physician counseled: yes

## 2010-04-12 NOTE — Assessment & Plan Note (Signed)
Summary: knee pain/njr   Vital Signs:  Patient profile:   45 year old male Temp:     98.0 degrees F oral BP sitting:   130 / 80  (left arm) Cuff size:   large  Vitals Entered By: Kern Reap CMA Duncan Dull) (December 27, 2009 10:22 AM) CC: left knee pain Is Patient Diabetic? Yes Did you bring your meter with you today? No Pain Assessment Patient in pain? yes     Location: knee Intensity: 5 Type: sharp Onset of pain  Sudden   CC:  left knee pain.  History of Present Illness: Raymond Lutz is a 45 y/o male  whoslipped at work last thursday........11/13.......and twisted his left knee.It was  sore,but the next day was extremely swollen too.  now it is very swollen and painful.  He cannot bear weight.  I made an appointment at 9 a.m. Tuesday morning to see Dr. Madelon Lips,  Allergies: No Known Drug Allergies  Past History:  Past medical, surgical, family and social histories (including risk factors) reviewed for relevance to current acute and chronic problems.  Past Medical History: Reviewed history from 01/03/2008 and no changes required. Diabetes mellitus, type II Hyperlipidemia Hypertension ASTHMA, CHILDHOOD (ICD-493.00) OBESITY (ICD-278.00) Peripheral vascular disease ED  Past Surgical History: Reviewed history from 10/17/2006 and no changes required. Hernia Repair  Family History: Reviewed history from 12/06/2007 and no changes required. Family History Diabetes 1st degree relative Family History Hypertension  Social History: Reviewed history from 12/06/2007 and no changes required. Occupation: Married Never Smoked Alcohol use-no Drug use-no Regular exercise-yes  Review of Systems      See HPI  Physical Exam  General:  Well-developed,well-nourished,in no acute distress; alert,appropriate and cooperative throughout examination Msk:  the left knee is swollen with tenderness medially and laterally.   Impression & Recommendations:  Problem # 1:  KNEE PAIN,  LEFT, ACUTE (ICD-719.46) Assessment New  His updated medication list for this problem includes:    Vicodin Es 7.5-750 Mg Tabs (Hydrocodone-acetaminophen) .Marland Kitchen... Take 1 tablet by mouth four times a day as needed pain  Complete Medication List: 1)  Potassium Chloride Crys Cr 20 Meq Tbcr (Potassium chloride crys cr) .... 2 by mouth qam 2)  Diltiazem Hcl Er Beads 420 Mg Cp24 (Diltiazem hcl er beads) .... Take one by mouth daily 3)  Cialis 20 Mg Tabs (Tadalafil) .... Prn 4)  Ventolin Hfa 108 (90 Base) Mcg/act Aers (Albuterol sulfate) .... 2 ps three times a day prn 5)  Glucophage 500 Mg Tabs (Metformin hcl) .... Take 1 tablet by mouth two times a day 6)  Accu-chek Compact Test Drum Strp (Glucose blood) .... Test 3 times a week 7)  Lipitor 20 Mg Tabs (Atorvastatin calcium) .Marland Kitchen.. 1 tab @ bedtime 8)  Vicodin Es 7.5-750 Mg Tabs (Hydrocodone-acetaminophen) .... Take 1 tablet by mouth four times a day as needed pain  Patient Instructions: 1)  stay at bed rest at home today see Dr. Madelon Lips tomorrow morning at 9 a.m. at [facility] ortho on [address]. 2)  Call their  office today at [phone] give them are your insurance information Prescriptions: VICODIN ES 7.5-750 MG TABS (HYDROCODONE-ACETAMINOPHEN) Take 1 tablet by mouth four times a day as needed pain  #20 x 0   Entered and Authorized by:   Roderick Pee MD   Signed by:   Roderick Pee MD on 12/27/2009   Method used:   Print then Give to Patient   RxID:   [id number]    Orders Added:  1)  Est. Patient Level III [91478]

## 2010-04-12 NOTE — Assessment & Plan Note (Signed)
Summary: upset stomach/mhf   Vital Signs:  Patient Profile:   45 Years Old Male Height:     74 inches (187.96 cm) Temp:     98.7 degrees F oral BP sitting:   140 / 78  (left arm) Cuff size:   large  Vitals Entered By: Kern Reap CMA (April 07, 2008 11:26 AM) Weight > 350 lbs. Yes                 Chief Complaint:  gi upset .  History of Present Illness: Raymond Lutz is a 45 year old male, who comes in today with a 6-day history of nausea and diarrhea.  His symptoms started last Wednesday January the 20th with fever, chills, nausea, and diarrhea.  He is having occasional vomiting.  Today he feels much better.  He only had one loose bowel movement today.  No fever, chills, or vomiting.  He still feels very nauseated.  He states his daughter had the same type viral syndrome.  That lasted for a couple days.  His blood sugar would today was 113.  Blood pressure 140/78.  He continued taking his other medications.    Prior Medication List:  FUROSEMIDE 40 MG  TABS (FUROSEMIDE) 2 by mouth qam SIMVASTATIN 80 MG  TABS (SIMVASTATIN) one daily POTASSIUM CHLORIDE CRYS CR 20 MEQ  TBCR (POTASSIUM CHLORIDE CRYS CR) 2 by mouth qam DILTIAZEM HCL ER BEADS 420 MG  CP24 (DILTIAZEM HCL ER BEADS) Take one by mouth daily CIALIS 20 MG  TABS (TADALAFIL) prn VENTOLIN HFA 108 (90 BASE) MCG/ACT  AERS (ALBUTEROL SULFATE) 2 ps three times a day prn GLUCOPHAGE 500 MG TABS (METFORMIN HCL) Take 1 tablet by mouth two times a day ACCU-CHEK COMPACT TEST DRUM  STRP (GLUCOSE BLOOD) test 3 times a week   Current Allergies: No known allergies   Past Medical History:    Reviewed history from 01/03/2008 and no changes required:       Diabetes mellitus, type II       Hyperlipidemia       Hypertension       ASTHMA, CHILDHOOD (ICD-493.00)       OBESITY (ICD-278.00)       Peripheral vascular disease       ED   Social History:    Reviewed history from 12/06/2007 and no changes required:       Occupation:       Married       Never Smoked       Alcohol use-no       Drug use-no       Regular exercise-yes    Review of Systems      See HPI   Physical Exam  General:     Well-developed,well-nourished,in no acute distress; alert,appropriate and cooperative throughout examination Abdomen:     the abdomen is very obese.  Bowel sounds are normal.  There is diffuse abdominal tenderness.  No rebound    Impression & Recommendations:  Problem # 1:  DIARRHEA (ICD-787.91) Assessment: New  Complete Medication List: 1)  Furosemide 40 Mg Tabs (Furosemide) .... 2 by mouth qam 2)  Simvastatin 80 Mg Tabs (Simvastatin) .... One daily 3)  Potassium Chloride Crys Cr 20 Meq Tbcr (Potassium chloride crys cr) .... 2 by mouth qam 4)  Diltiazem Hcl Er Beads 420 Mg Cp24 (Diltiazem hcl er beads) .... Take one by mouth daily 5)  Cialis 20 Mg Tabs (Tadalafil) .... Prn 6)  Ventolin Hfa 108 (90 Base) Mcg/act Aers (Albuterol sulfate) .Marland KitchenMarland KitchenMarland Kitchen  2 ps three times a day prn 7)  Glucophage 500 Mg Tabs (Metformin hcl) .... Take 1 tablet by mouth two times a day 8)  Accu-chek Compact Test Drum Strp (Glucose blood) .... Test 3 times a week 9)  Promethazine Hcl 12.5 Mg Tabs (Promethazine hcl) .... Take 1 tablet by mouth three times a day as needed   Patient Instructions: 1)  stay on clear liquids if you have a soda assured.  Sugar-free.  Hold the Lasix, Zocor, potassium until the diarrhea has stopped for 3 or 4 days.  Then resume those medications.  Continue the doubt ties and and Glucophage.  Return to work on Thursday   Prescriptions: PROMETHAZINE HCL 12.5 MG TABS (PROMETHAZINE HCL) Take 1 tablet by mouth three times a day as needed  #20 x 1   Entered and Authorized by:   Roderick Pee MD   Signed by:   Roderick Pee MD on 04/07/2008   Method used:   Electronically to        [facility]  [phone]* (retail)       [address]       [place], [place]  [address]       Ph:  [phone] or [phone]       Fax: [phone]   RxID:   [id number]

## 2010-04-12 NOTE — Progress Notes (Signed)
Summary: lab results  Phone Note Outgoing Call   Call placed by: Kern Reap CMA,  January 07, 2008 12:33 PM Details for Reason: lab results Summary of Call: left message on machine for pt to return call.  patient's a1c was 8.8 and now it is 8.3. the goal is to be below 7.  please continue treatment and follow up in 3 months  Follow-up for Phone Call        patient is aware Follow-up by: Kern Reap CMA,  January 07, 2008 12:43 PM

## 2010-04-12 NOTE — Assessment & Plan Note (Signed)
Summary: sinuses--ok per rachel//ccm   Vital Signs:  Patient profile:   45 year old male Height:      70 inches Weight:      360 pounds BMI:     51.84 Temp:     98.3 degrees F oral BP sitting:   140 / 90  (left arm) Cuff size:   large  Vitals Entered By: Kern Reap CMA Duncan Dull) (October 11, 2009 3:30 PM)  Contraindications/Deferment of Procedures/Staging:    Test/Procedure: Weight Refused    Reason for deferment: patient declined-cannot calculate BMI   History of Present Illness: Raymond Lutz is a 45 year old, married male, nonsmoker, who comes in today with a 6-day history of a flare of his allergic rhinitis.  He said, sneezing, runny nose, head congestion, postnasal drip.  No cough for the last 6 days.  He does not recall specific trigger pain usually is allergy problems.  Only bother him in the spring.  Also, he is on simvastatin 80 mg daily that was pulled.  Will change to Lipitor.  He also takes Cialis 20 mg p.r.n. for ED wants to discuss other options.  Blood sugar normal 1032 days ago.  Last physical exam was in October a year ago,  He's lost weight he stented 360  Allergies: No Known Drug Allergies  Past History:  Past medical, surgical, family and social histories (including risk factors) reviewed for relevance to current acute and chronic problems.  Past Medical History: Reviewed history from 01/03/2008 and no changes required. Diabetes mellitus, type II Hyperlipidemia Hypertension ASTHMA, CHILDHOOD (ICD-493.00) OBESITY (ICD-278.00) Peripheral vascular disease ED  Past Surgical History: Reviewed history from 10/17/2006 and no changes required. Hernia Repair  Family History: Reviewed history from 12/06/2007 and no changes required. Family History Diabetes 1st degree relative Family History Hypertension  Social History: Reviewed history from 12/06/2007 and no changes required. Occupation: Married Never Smoked Alcohol use-no Drug use-no Regular  exercise-yes  Review of Systems      See HPI  Physical Exam  General:  Well-developed,well-nourished,in no acute distress; alert,appropriate and cooperative throughout examination Head:  Normocephalic and atraumatic without obvious abnormalities. No apparent alopecia or balding. Eyes:  No corneal or conjunctival inflammation noted. EOMI. Perrla. Funduscopic exam benign, without hemorrhages, exudates or papilledema. Vision grossly normal. Ears:  External ear exam shows no significant lesions or deformities.  Otoscopic examination reveals clear canals, tympanic membranes are intact bilaterally without bulging, retraction, inflammation or discharge. Hearing is grossly normal bilaterally. Nose:  External nasal examination shows no deformity or inflammation. Nasal mucosa are pink and moist without lesions or exudates. Mouth:  Oral mucosa and oropharynx without lesions or exudates.  Teeth in good repair. Neck:  No deformities, masses, or tenderness noted. Lungs:  Normal respiratory effort, chest expands symmetrically. Lungs are clear to auscultation, no crackles or wheezes.   Problems:  Medical Problems Added: 1)  Dx of Rhinitis  (ICD-477.9) 2)  Dx of Sinusitis- Acute-nos  (ICD-461.9)  Impression & Recommendations:  Problem # 1:  HYPERLIPIDEMIA (ICD-272.4) Assessment Unchanged  The following medications were removed from the medication list:    Simvastatin 80 Mg Tabs (Simvastatin) ..... One daily His updated medication list for this problem includes:    Lipitor 20 Mg Tabs (Atorvastatin calcium) .Marland Kitchen... 1 tab @ bedtime  Problem # 2:  RHINITIS (ICD-477.9) Assessment: New  Orders: Prescription Created Electronically [id number]  Complete Medication List: 1)  Furosemide 40 Mg Tabs (Furosemide) .... 2 by mouth qam 2)  Potassium Chloride Crys Cr 20 Meq  Tbcr (Potassium chloride crys cr) .... 2 by mouth qam 3)  Diltiazem Hcl Er Beads 420 Mg Cp24 (Diltiazem hcl er beads) .... Take one by mouth  daily 4)  Cialis 20 Mg Tabs (Tadalafil) .... Prn 5)  Ventolin Hfa 108 (90 Base) Mcg/act Aers (Albuterol sulfate) .... 2 ps three times a day prn 6)  Glucophage 500 Mg Tabs (Metformin hcl) .... Take 1 tablet by mouth two times a day 7)  Accu-chek Compact Test Drum Strp (Glucose blood) .... Test 3 times a week 8)  Hydromet 5-1.5 Mg/2ml Syrp (Hydrocodone-homatropine) .Marland Kitchen.. 1 or 2 tsp at bedtime as needed 9)  Lipitor 20 Mg Tabs (Atorvastatin calcium) .Marland Kitchen.. 1 tab @ bedtime  Patient Instructions: 1)  stop the Zocor, and began the Lipitor at 20 mg nightly 2)  Take plain Claritin in the morning daily for your allergy symptoms. 3)  Set up a time, the first week in October for a complete exam 4)  BMP prior to visit, ICD-9:...........250.00 5)  Hepatic Panel prior to visit, ICD-9: 6)  Lipid Panel prior to visit, ICD-9: 7)  TSH prior to visit, ICD-9: 8)  CBC w/ Diff prior to visit, ICD-9: 9)  Urine-dip prior to visit, ICD-9: 10)  PSA prior to visit, ICD-9: 11)  HbgA1C prior to visit, ICD-9: 12)  Urine Microalbumin prior to visit, ICD-9: Prescriptions: CIALIS 20 MG  TABS (TADALAFIL) prn  #6 Each x 11   Entered and Authorized by:   Roderick Pee MD   Signed by:   Roderick Pee MD on 10/11/2009   Method used:   Electronically to        [facility]  [phone]* (retail)       [address]       [place], [place]  [address]       Ph: [phone] or [phone]       Fax: [phone]   RxID:   [id number] LIPITOR 20 MG TABS (ATORVASTATIN CALCIUM) 1 tab @ bedtime  #100 x 1   Entered and Authorized by:   Roderick Pee MD   Signed by:   Roderick Pee MD on 10/11/2009   Method used:   Electronically to        [facility]  [phone]* (retail)       [address]       [place], [place]  [address]       Ph: [phone] or [phone]       Fax: [phone]   RxID:   [id number]

## 2010-04-12 NOTE — Progress Notes (Signed)
Summary: simvastatin  Phone Note Refill Request Message from:  Fax from Pharmacy on April 13, 2008 1:38 PM  Refills Requested: Medication #1:  SIMVASTATIN 80 MG  TABS one daily Initial call taken by: Kern Reap CMA,  April 13, 2008 1:38 PM      Prescriptions: SIMVASTATIN 80 MG  TABS (SIMVASTATIN) one daily  #100 x 0   Entered by:   Kern Reap CMA   Authorized by:   Roderick Pee MD   Signed by:   Kern Reap CMA on 04/13/2008   Method used:   Electronically to        [facility]  [phone]* (retail)       [address]       [place], [place]  [address]       Ph: [phone] or [phone]       Fax: [phone]   RxID:   [id number]

## 2010-04-12 NOTE — Progress Notes (Signed)
Summary: refill  Phone Note Call from Patient Call back at Home Phone [phone]   Caller: pt live Call For: Raymond Lutz Summary of Call: please call in refills for simvastatin 80 mg diltiazem hcl er 420 mg furosemide 40 mg potassium chloride 20 meg His CPX is scheduled in October  Initial call taken by: Roselle Locus,  November 04, 2007 12:29 PM  Follow-up for Phone Call        rx sent Follow-up by: Kern Reap CMA,  November 04, 2007 12:33 PM      Prescriptions: FUROSEMIDE 40 MG  TABS (FUROSEMIDE) Take one by mouth every morning  #100 x 0   Entered by:   Kern Reap CMA   Authorized by:   Roderick Pee MD   Signed by:   Kern Reap CMA on 11/04/2007   Method used:   Electronically to        [facility]  [phone]* (retail)       [address]       [place], [place]  [address]       Ph: [phone] or [phone]       Fax: [phone]   RxID:   [id number] DILTIAZEM HCL ER BEADS 420 MG  CP24 (DILTIAZEM HCL ER BEADS) Take one by mouth daily  #100 x 0   Entered by:   Kern Reap CMA   Authorized by:   Roderick Pee MD   Signed by:   Kern Reap CMA on 11/04/2007   Method used:   Electronically to        [facility]  [phone]* (retail)       [address]       [place], [place]  [address]       Ph: [phone] or [phone]       Fax: [phone]   RxID:   [id number] POTASSIUM CHLORIDE CRYS CR 20 MEQ  TBCR (POTASSIUM CHLORIDE CRYS CR) Take one tablet by mouth daily  #100 x 0   Entered by:   Kern Reap CMA   Authorized by:   Roderick Pee MD   Signed by:   Kern Reap CMA on 11/04/2007   Method used:   Electronically to        [facility]  [phone]* (retail)       [address]       [place], [place]  [address]       Ph: [phone] or [phone]       Fax: [phone]   RxID:   [id number] SIMVASTATIN 80 MG  TABS (SIMVASTATIN)  one daily  #100 x 0   Entered by:   Kern Reap CMA   Authorized by:   Roderick Pee MD   Signed by:   Kern Reap CMA on 11/04/2007   Method used:   Electronically to        [facility]  [phone]* (retail)       [address]       [place], [place]  [address]       Ph: [phone] or [phone]       Fax: [phone]   RxID:   [id number]

## 2010-04-12 NOTE — Assessment & Plan Note (Signed)
Summary: stuffy head/chest congestion/sore throat/cjr   Vital Signs:  Patient profile:   45 year old male Height:      74 inches Weight:      370 pounds BMI:     47.68 Temp:     98.4 degrees F oral BP sitting:   130 / 90  (left arm) Cuff size:   large  Vitals Entered By: Kern Reap CMA Duncan Dull) (December 17, 2008 3:43 PM)  Reason for Visit head congestion  History of Present Illness: Raymond Lutz is a 45 year old male, who comes in today for evaluation of two problems.  For the past 5 days, head congestion, runny nose, and cough.  He's had no fever, chills, earache, sore throat, nausea, vomiting, or diarrhea.  He has had a history of underlying asthma.  He has Ventolin at home, but does not need to use it.  He also has underlying diabetes and is on metformin 500 mg b.i.d. fasting blood sugar today 101.  Last A1c7.5 done 9 months ago.  Head routine eye exam by his ophthalmologist in July, which was normal  Allergies (verified): No Known Drug Allergies  Past History:  Past medical, surgical, family and social histories (including risk factors) reviewed for relevance to current acute and chronic problems.  Past Medical History: Reviewed history from 01/03/2008 and no changes required. Diabetes mellitus, type II Hyperlipidemia Hypertension ASTHMA, CHILDHOOD (ICD-493.00) OBESITY (ICD-278.00) Peripheral vascular disease ED  Past Surgical History: Reviewed history from 10/17/2006 and no changes required. Hernia Repair  Family History: Reviewed history from 12/06/2007 and no changes required. Family History Diabetes 1st degree relative Family History Hypertension  Social History: Reviewed history from 12/06/2007 and no changes required. Occupation: Married Never Smoked Alcohol use-no Drug use-no Regular exercise-yes  Review of Systems      See HPI  Physical Exam  General:  Well-developed,well-nourished,in no acute distress; alert,appropriate and cooperative  throughout examination Head:  Normocephalic and atraumatic without obvious abnormalities. No apparent alopecia or balding. Eyes:  No corneal or conjunctival inflammation noted. EOMI. Perrla. Funduscopic exam benign, without hemorrhages, exudates or papilledema. Vision grossly normal. Ears:  External ear exam shows no significant lesions or deformities.  Otoscopic examination reveals clear canals, tympanic membranes are intact bilaterally without bulging, retraction, inflammation or discharge. Hearing is grossly normal bilaterally. Nose:  External nasal examination shows no deformity or inflammation. Nasal mucosa are pink and moist without lesions or exudates. Mouth:  Oral mucosa and oropharynx without lesions or exudates.  Teeth in good repair.  Diabetes Management Exam:    Foot Exam (with socks and/or shoes not present):       Sensory-Pinprick/Light touch:          Left medial foot (L-4): normal          Left dorsal foot (L-5): normal          Left lateral foot (S-1): normal       Sensory-Monofilament:          Left foot: normal       Inspection:          Left foot: normal       Nails:          Left foot: normal    Eye Exam:       Eye Exam done elsewhere          Date: 09/29/2008          Results: normal          Done by: Minda Meo  Complete Medication List: 1)  Furosemide 40 Mg Tabs (Furosemide) .... 2 by mouth qam 2)  Simvastatin 80 Mg Tabs (Simvastatin) .... One daily 3)  Potassium Chloride Crys Cr 20 Meq Tbcr (Potassium chloride crys cr) .... 2 by mouth qam 4)  Diltiazem Hcl Er Beads 420 Mg Cp24 (Diltiazem hcl er beads) .... Take one by mouth daily 5)  Cialis 20 Mg Tabs (Tadalafil) .... Prn 6)  Ventolin Hfa 108 (90 Base) Mcg/act Aers (Albuterol sulfate) .... 2 ps three times a day prn 7)  Glucophage 500 Mg Tabs (Metformin hcl) .... Take 1 tablet by mouth two times a day 8)  Accu-chek Compact Test Drum Strp (Glucose blood) .... Test 3 times a week 9)  Hydromet 5-1.5 Mg/57ml Syrp  (Hydrocodone-homatropine) .Marland Kitchen.. 1 or 2 tsp at bedtime as needed  Other Orders: Venipuncture (38756) TLB-Lipid Panel (80061-LIPID) TLB-BMP (Basic Metabolic Panel-BMET) (80048-METABOL) TLB-CBC Platelet - w/Differential (85025-CBCD) TLB-Hepatic/Liver Function Pnl (80076-HEPATIC) TLB-TSH (Thyroid Stimulating Hormone) (84443-TSH) TLB-A1C / Hgb A1C (Glycohemoglobin) (83036-A1C) TLB-Microalbumin/Creat Ratio, Urine (82043-MALB)  Patient Instructions: 1)  Get plenty of rest, drink lots of clear liquids, and use Tylenol or Ibuprofen for fever and comfort. Return in 7-10 days if you're not better:sooner if you're feeling worse. 2)  Take 650-1000mg  of Tylenol every 4-6 hours as needed for relief of pain or comfort of fever AVOID taking more than 4000mg   in a 24 hour period (can cause liver damage in higher doses). 3)  you may take Hytrin at one or 2 teaspoons at bedtime as needed for nighttime cough. 4)  I will call you when I get your lab work back Prescriptions: HYDROMET 5-1.5 MG/5ML SYRP (HYDROCODONE-HOMATROPINE) 1 or 2 tsp at bedtime as needed  #8oz x 1   Entered and Authorized by:   Roderick Pee MD   Signed by:   Roderick Pee MD on 12/17/2008   Method used:   Print then Give to Patient   RxID:   [id number]   Appended Document: stuffy head/chest congestion/sore throat/cjr     Allergies: No Known Drug Allergies   Impression & Recommendations:  Problem # 1:  DIABETES MELLITUS, TYPE II (ICD-250.00) Assessment Deteriorated  His updated medication list for this problem includes:    Glucophage 500 Mg Tabs (Metformin hcl) .Marland Kitchen... Take 1 tablet by mouth two times a day  Problem # 2:  COUGH (ICD-786.2) Assessment: New  Complete Medication List: 1)  Furosemide 40 Mg Tabs (Furosemide) .... 2 by mouth qam 2)  Simvastatin 80 Mg Tabs (Simvastatin) .... One daily 3)  Potassium Chloride Crys Cr 20 Meq Tbcr (Potassium chloride crys cr) .... 2 by mouth qam 4)  Diltiazem Hcl Er Beads 420  Mg Cp24 (Diltiazem hcl er beads) .... Take one by mouth daily 5)  Cialis 20 Mg Tabs (Tadalafil) .... Prn 6)  Ventolin Hfa 108 (90 Base) Mcg/act Aers (Albuterol sulfate) .... 2 ps three times a day prn 7)  Glucophage 500 Mg Tabs (Metformin hcl) .... Take 1 tablet by mouth two times a day 8)  Accu-chek Compact Test Drum Strp (Glucose blood) .... Test 3 times a week 9)  Hydromet 5-1.5 Mg/51ml Syrp (Hydrocodone-homatropine) .Marland Kitchen.. 1 or 2 tsp at bedtime as needed  Patient Instructions: 1)  Get plenty of rest, drink lots of clear liquids, and use Tylenol or Ibuprofen for fever and comfort. Return in 7-10 days if you're not better:sooner if you're feeling worse. 2)  Take 650-1000mg  of Tylenol every 4-6 hours as needed for relief of  pain or comfort of fever AVOID taking more than 4000mg   in a 24 hour period (can cause liver damage in higher doses). 3)  continue the treatment program for your diabetes follow-up as outlined

## 2010-04-12 NOTE — Assessment & Plan Note (Signed)
Summary: blood pressure/mhf   Vital Signs:  Patient Profile:   45 Years Old Male Height:     74 inches (187.96 cm) Temp:     98.4 degrees F oral Pulse rate:   68 / minute Pulse rhythm:   regular BP sitting:   150 / 100  (right arm) Cuff size:   large  Vitals Entered By: Kern Reap CMA (November 26, 2007 12:17 PM) Weight > 350 lbs. Yes                 Chief Complaint:  leg swelling and tired.  History of Present Illness: Raymond Lutz is a 45 year old male with a long-term problem with morbid obesity.  Weight not able to be obtained on office scales, who comes in today with a one to two week history of fatigue and swelling of his legs.  He states is compliant with 40 mg of Lasix a day every feed and swelling more than normal.  He also feels tired, and no energy.  He said no fever or aches, sore throat, cough, nausea, vomiting, diarrhea.  He, states he's otherwise been well.  He states his blood pressure home runs 130/80.    Prior Medication List:  FUROSEMIDE 40 MG  TABS (FUROSEMIDE) Take one by mouth every morning SIMVASTATIN 80 MG  TABS (SIMVASTATIN) one daily POTASSIUM CHLORIDE CRYS CR 20 MEQ  TBCR (POTASSIUM CHLORIDE CRYS CR) Take one tablet by mouth daily DILTIAZEM HCL ER BEADS 420 MG  CP24 (DILTIAZEM HCL ER BEADS) Take one by mouth daily CIALIS 20 MG  TABS (TADALAFIL) prn VENTOLIN HFA 108 (90 BASE) MCG/ACT  AERS (ALBUTEROL SULFATE) 2 ps three times a day prn   Current Allergies: No known allergies   Past Medical History:    Reviewed history from 10/17/2006 and no changes required:       Diabetes mellitus, type II       Hyperlipidemia       Hypertension       ASTHMA, CHILDHOOD (ICD-493.00)       OBESITY (ICD-278.00)       Peripheral vascular disease     Review of Systems      See HPI   Physical Exam  General:     Well-developed,well-nourished,in no acute distress; alert,appropriate and cooperative throughout examination Head:     Normocephalic and  atraumatic without obvious abnormalities. No apparent alopecia or balding. Eyes:     No corneal or conjunctival inflammation noted. EOMI. Perrla. Funduscopic exam benign, without hemorrhages, exudates or papilledema. Vision grossly normal. Ears:     External ear exam shows no significant lesions or deformities.  Otoscopic examination reveals clear canals, tympanic membranes are intact bilaterally without bulging, retraction, inflammation or discharge. Hearing is grossly normal bilaterally. Nose:     External nasal examination shows no deformity or inflammation. Nasal mucosa are pink and moist without lesions or exudates. Mouth:     Oral mucosa and oropharynx without lesions or exudates.  Teeth in good repair. Neck:     No deformities, masses, or tenderness noted. Lungs:     Normal respiratory effort, chest expands symmetrically. Lungs are clear to auscultation, no crackles or wheezes. Heart:     Normal rate and regular rhythm. S1 and S2 normal without gallop, murmur, click, rub or other extra sounds. Extremities:     1+ left pedal edema and 1+ right pedal edema.      Impression & Recommendations:  Problem # 1:  PERIPHERAL VASCULAR DISEASE (ICD-443.9) Assessment: Deteriorated  Orders: Venipuncture ([address]) TLB-BMP (Basic Metabolic Panel-BMET) (80048-METABOL) TLB-CBC Platelet - w/Differential (85025-CBCD) TLB-Hepatic/Liver Function Pnl (80076-HEPATIC) TLB-TSH (Thyroid Stimulating Hormone) (84443-TSH) TLB-A1C / Hgb A1C (Glycohemoglobin) (83036-A1C)   Problem # 2:  OBESITY (ICD-278.00) Assessment: Unchanged  Orders: Venipuncture ([address]) TLB-BMP (Basic Metabolic Panel-BMET) (80048-METABOL) TLB-CBC Platelet - w/Differential (85025-CBCD) TLB-Hepatic/Liver Function Pnl (80076-HEPATIC) TLB-TSH (Thyroid Stimulating Hormone) (84443-TSH) TLB-A1C / Hgb A1C (Glycohemoglobin) (83036-A1C)   Complete Medication List: 1)  Furosemide 40 Mg Tabs (Furosemide) .... 2 by mouth qam 2)   Simvastatin 80 Mg Tabs (Simvastatin) .... One daily 3)  Potassium Chloride Crys Cr 20 Meq Tbcr (Potassium chloride crys cr) .... 2 by mouth qam 4)  Diltiazem Hcl Er Beads 420 Mg Cp24 (Diltiazem hcl er beads) .... Take one by mouth daily 5)  Cialis 20 Mg Tabs (Tadalafil) .... Prn 6)  Ventolin Hfa 108 (90 Base) Mcg/act Aers (Albuterol sulfate) .... 2 ps three times a day prn   Patient Instructions: 1)  take 40 mg of Lasix twice a day, take two, potassium tablets daily, [place] [facility].  We will get some lab work today and I will call you to discuss the results   Prescriptions: FUROSEMIDE 40 MG  TABS (FUROSEMIDE) 2 by mouth qam  #200 x 3   Entered and Authorized by:   Roderick Pee MD   Signed by:   Roderick Pee MD on 11/26/2007   Method used:   Electronically to        [facility]  [phone]* (retail)       [address]       [place], [place]  [address]       Ph: [phone] or [phone]       Fax: [phone]   RxID:   [id number] POTASSIUM CHLORIDE CRYS CR 20 MEQ  TBCR (POTASSIUM CHLORIDE CRYS CR) 2 by mouth qam  #200 x 3   Entered and Authorized by:   Roderick Pee MD   Signed by:   Roderick Pee MD on 11/26/2007   Method used:   Electronically to        [facility]  [phone]* (retail)       [address]       [place], [place]  [address]       Ph: [phone] or [phone]       Fax: [phone]   RxID:   [id number]  ]

## 2010-04-12 NOTE — Assessment & Plan Note (Signed)
   Diabetes Management History:      He says that he is exercising.    Allergies: No Known Drug Allergies  Diabetes Management Exam:    Eye Exam:       Eye Exam done elsewhere          Date: 09/11/2008          Results: normal          Done by: [facility]   Complete Medication List: 1)  Furosemide 40 Mg Tabs (Furosemide) .... 2 by mouth qam 2)  Simvastatin 80 Mg Tabs (Simvastatin) .... One daily 3)  Potassium Chloride Crys Cr 20 Meq Tbcr (Potassium chloride crys cr) .... 2 by mouth qam 4)  Diltiazem Hcl Er Beads 420 Mg Cp24 (Diltiazem hcl er beads) .... Take one by mouth daily 5)  Cialis 20 Mg Tabs (Tadalafil) .... Prn 6)  Ventolin Hfa 108 (90 Base) Mcg/act Aers (Albuterol sulfate) .... 2 ps three times a day prn 7)  Glucophage 500 Mg Tabs (Metformin hcl) .... Take 1 tablet by mouth two times a day 8)  Accu-chek Compact Test Drum Strp (Glucose blood) .... Test 3 times a week 9)  Promethazine Hcl 12.5 Mg Tabs (Promethazine hcl) .... Take 1 tablet by mouth three times a day as needed

## 2010-04-12 NOTE — Assessment & Plan Note (Signed)
Summary: fu per pt/njr   Vital Signs:  Patient Profile:   45 Years Old Male Height:     74 inches (187.96 cm) Temp:     98.7 degrees F oral Pulse rate:   68 / minute Pulse rhythm:   regular BP sitting:   134 / 84  (left arm) Cuff size:   large  Vitals Entered By: Kern Reap CMA (December 06, 2007 9:10 AM) Weight > 350 lbs. Yes                 Chief Complaint:  follow up with lab results.  History of Present Illness: Raymond Lutz is a 45 year old male with underlying hypertension, obesity, hyperlipidemia, and now, diabetes, who comes in today for evaluation.  His fasting blood sugar is in the 150 range.  Here and at home.  His been using his wife's glucometer.  His hemoglobin A1c is 8.8%, which represents a marked elevation of blood sugar in the 250 to 350 range.  He [place] himself [place] the [facility] market on [place] the 15th and weighed 404 pounds.  Since that, time.  He's been following a diet is not sure if he lost weight.  We discussed various options, including diet, exercise, weight loss, and surgical intervention.  However, patient declines to have consult would like to try the diet and exercise program.  First.    Prior Medication List:  FUROSEMIDE 40 MG  TABS (FUROSEMIDE) 2 by mouth qam SIMVASTATIN 80 MG  TABS (SIMVASTATIN) one daily POTASSIUM CHLORIDE CRYS CR 20 MEQ  TBCR (POTASSIUM CHLORIDE CRYS CR) 2 by mouth qam DILTIAZEM HCL ER BEADS 420 MG  CP24 (DILTIAZEM HCL ER BEADS) Take one by mouth daily CIALIS 20 MG  TABS (TADALAFIL) prn VENTOLIN HFA 108 (90 BASE) MCG/ACT  AERS (ALBUTEROL SULFATE) 2 ps three times a day prn   Current Allergies: No known allergies   Past Medical History:    Reviewed history from 11/26/2007 and no changes required:       Diabetes mellitus, type II       Hyperlipidemia       Hypertension       ASTHMA, CHILDHOOD (ICD-493.00)       OBESITY (ICD-278.00)       Peripheral vascular disease   Family History:    Reviewed history and no  changes required:       Family History Diabetes 1st degree relative       Family History Hypertension  Social History:    Reviewed history and no changes required:       Occupation:       Married       Never Smoked       Alcohol use-no       Drug use-no       Regular exercise-yes   Risk Factors:  Tobacco use:  never Drug use:  no Alcohol use:  no Exercise:  yes   Review of Systems      See HPI   Physical Exam  General:     Well-developed,well-nourished,in no acute distress; alert,appropriate and cooperative throughout examination    Impression & Recommendations:  Problem # 1:  DIABETES MELLITUS, TYPE II (ICD-250.00) Assessment: Deteriorated  His updated medication list for this problem includes:    Glucophage 500 Mg Tabs (Metformin hcl) .Marland Kitchen... Take 1 tablet by mouth two times a day Flu Vaccine Consent Questions     Do you have a history of severe allergic reactions to this vaccine? no  Any prior history of allergic reactions to egg and/or gelatin? no    Do you have a sensitivity to the preservative Thimersol? no    Do you have a past history of Guillan-Barre Syndrome? no    Do you currently have an acute febrile illness? no    Have you ever had a severe reaction to latex? no    Vaccine information given and explained to patient? yes    Are you currently pregnant? no    Lot Number:[id number]   Site Given  right  Deltoid IM   Complete Medication List: 1)  Furosemide 40 Mg Tabs (Furosemide) .... 2 by mouth qam 2)  Simvastatin 80 Mg Tabs (Simvastatin) .... One daily 3)  Potassium Chloride Crys Cr 20 Meq Tbcr (Potassium chloride crys cr) .... 2 by mouth qam 4)  Diltiazem Hcl Er Beads 420 Mg Cp24 (Diltiazem hcl er beads) .... Take one by mouth daily 5)  Cialis 20 Mg Tabs (Tadalafil) .... Prn 6)  Ventolin Hfa 108 (90 Base) Mcg/act Aers (Albuterol sulfate) .... 2 ps three times a day prn 7)  Glucophage 500 Mg Tabs (Metformin hcl) .... Take 1 tablet by mouth two  times a day  Other Orders: Admin 1st Vaccine (38756) Flu Vaccine 57yrs + ([address])   Patient Instructions: 1)  continue to limit your caloric intake.  Drinks 50 ounces of water daily.  Walk 20 minutes a day.  Begin Glucophage 500 mg twice a day, weigh yourself weekly at the fresh market, check a fasting blood sugar, Monday, Wednesday, Friday.  Return in 4 weeks for follow-up   Prescriptions: GLUCOPHAGE 500 MG TABS (METFORMIN HCL) Take 1 tablet by mouth two times a day  #200 x 3   Entered and Authorized by:   Roderick Pee MD   Signed by:   Roderick Pee MD on 12/06/2007   Method used:   Electronically to        [facility]  [phone]* (retail)       [address]       [place], [place]  [address]       Ph: [phone] or [phone]       Fax: [phone]   RxID:   [id number]  ]

## 2010-04-12 NOTE — Assessment & Plan Note (Signed)
Summary: RENEW MEDS/CCM   Vital Signs:  Patient Profile:   45 Years Old Male Temp:     98.4 degrees F oral Pulse rate:   82 / minute Pulse rhythm:   regular Resp:     16 per minute BP sitting:   156 / 88  Vitals Entered By: Lynann Beaver CMA (October 17, 2006 9:18 AM)               Chief Complaint:  re check.  History of Present Illness: Raymond Lutz is here for evaluation of 3 problems.  Number one his hypertension.  He is on diltiazem 420 mg a day, along with potassium and Lasix 40 mg a day.  His potassium supplements, K-Dur, 20 mEq.  He says his blood pressure home is running 140/75.  He checks it daily.  Second issue is his weight is up to 395.  The third issue is erectile dysfunction.  His last physical was in July of 07.  At that time.  He also had glucose intolerance with mild blood sugar elevation, but an A1c is 6.4.  Acute Visit History:      He denies abdominal pain, chest pain, constipation, cough, diarrhea, earache, eye symptoms, fever, genitourinary symptoms, headache, musculoskeletal symptoms, nasal discharge, nausea, rash, sinus problems, sore throat, and vomiting.         Current Allergies: No known allergies   Past Medical History:    Reviewed history from 10/17/2006 and no changes required:       Diabetes mellitus, type II       Hyperlipidemia       Hypertension       ASTHMA, CHILDHOOD (ICD-493.00)       OBESITY (ICD-278.00)   Social History:    Reviewed history and no changes required:    Review of Systems      See HPI   Physical Exam  General:     Well-developed,well-nourished,in no acute distress; alert,appropriate and cooperative throughout examination    Impression & Recommendations:  Problem # 1:  HYPERTENSION (ICD-401.9) Assessment: Unchanged  His updated medication list for this problem includes:    Furosemide 40 Mg Tabs (Furosemide) .Marland Kitchen... Take one by mouth every morning    Diltiazem Hcl Er Beads 420 Mg Cp24 (Diltiazem hcl er beads)  .Marland Kitchen... Take one by mouth daily   Problem # 2:  OBESITY (ICD-278.00) Assessment: Unchanged  Complete Medication List: 1)  Furosemide 40 Mg Tabs (Furosemide) .... Take one by mouth every morning 2)  Simvastatin 80 Mg Tabs (Simvastatin) .... One daily 3)  Potassium Chloride Crys Cr 20 Meq Tbcr (Potassium chloride crys cr) .... Take one tablet by mouth daily 4)  Diltiazem Hcl Er Beads 420 Mg Cp24 (Diltiazem hcl er beads) .... Take one by mouth daily 5)  Cialis 20 Mg Tabs (Tadalafil) .... Prn  Other Orders: Venipuncture ([address]) TLB-BMP (Basic Metabolic Panel-BMET) (80048-METABOL) TLB-Lipid Panel (80061-LIPID) TLB-CBC Platelet - w/Differential (85025-CBCD) TLB-Hepatic/Liver Function Pnl (80076-HEPATIC) TLB-TSH (Thyroid Stimulating Hormone) (84443-TSH) TLB-A1C / Hgb A1C (Glycohemoglobin) (83036-A1C) UA Dipstick W/ Micro ([address])   Patient Instructions: 1)  Raymond Lutz was advised to make an attempt to die and exercise and weight loss.  Will renew his medicines at the [facility] on bowel ground.  Will get basic labs today and see him back in September for a physical exam. 2)  Please schedule a follow-up appointment in 1 month. 3)  It is important that you exercise regularly at least 20 minutes 5 times a week. If you  develop chest pain, have severe difficulty breathing, or feel very tired , stop exercising immediately and seek medical attention. 4)  Take an Aspirin every day.    Prescriptions: CIALIS 20 MG  TABS (TADALAFIL) prn  #6 x 11   Entered and Authorized by:   Roderick Pee MD   Signed by:   Roderick Pee MD on 10/17/2006   Method used:   Electronically sent to ...       [facility] [place]       [address]       [place], [place]  [address]       Ph: [phone] or [phone]       Fax: [phone]   RxID:   [id number] DILTIAZEM HCL ER BEADS 420 MG  CP24 (DILTIAZEM HCL ER BEADS) Take one by mouth daily  #100 x 3   Entered and  Authorized by:   Roderick Pee MD   Signed by:   Roderick Pee MD on 10/17/2006   Method used:   Electronically sent to ...       [facility] [place]*       [address]       [place], [place]  [address]       Ph: [phone] or [phone]       Fax: [phone]   RxID:   [id number] POTASSIUM CHLORIDE CRYS CR 20 MEQ  TBCR (POTASSIUM CHLORIDE CRYS CR) Take one tablet by mouth daily  #100 x 3   Entered and Authorized by:   Roderick Pee MD   Signed by:   Roderick Pee MD on 10/17/2006   Method used:   Electronically sent to ...       [facility] [place]*       [address]       [place], [place]  [address]       Ph: [phone] or [phone]       Fax: [phone]   RxID:   [id number] SIMVASTATIN 80 MG  TABS (SIMVASTATIN) one daily  #100 x 3   Entered and Authorized by:   Roderick Pee MD   Signed by:   Roderick Pee MD on 10/17/2006   Method used:   Electronically sent to ...       [facility] [place]*       [address]       [place], [place]  [address]       Ph: [phone] or [phone]       Fax: [phone]   RxID:   [id number] FUROSEMIDE 40 MG  TABS (FUROSEMIDE) Take one by mouth every morning  #100 x 3   Entered and Authorized by:   Roderick Pee MD   Signed by:   Roderick Pee MD on 10/17/2006   Method used:   Electronically sent to ...       [facility] [place]*       [address]       [place], [place]  [address]       Ph: [phone] or [phone]       Fax: [phone]   RxID:   [id number]        Appended Document: Orders Update  Clinical Lists Changes  Orders: Added new Test order of TLB-Lipid Panel (80061-LIPID) - Signed Added new Test order of TLB-BMP (Basic Metabolic Panel-BMET) (80048-METABOL) - Signed Added new Test order of TLB-CBC Platelet -  w/Differential (85025-CBCD) - Signed Added new Test order of TLB-Hepatic/Liver Function Pnl (80076-HEPATIC) - Signed Added new Test order of TLB-TSH (Thyroid Stimulating Hormone) (84443-TSH) - Signed Added new Test order of TLB-A1C / Hgb A1C (Glycohemoglobin) (83036-A1C) - Signed Observations: Added new observation of COMMENTS: ..................................................................Marland KitchenWynona Canes, CMA  October 17, 2006 11:18 AM  (10/17/2006 10:02) Added new observation of PH URINE: 7.0  (10/17/2006 10:02) Added new observation of SPEC GR URIN: 1.015  (10/17/2006 10:02) Added new observation of APPEARANCE U: Clear  (10/17/2006 10:02) Added new observation of UA COLOR: yellow  (10/17/2006 10:02) Added new observation of WBC DIPSTK U: negative  (10/17/2006 10:02) Added new observation of NITRITE URN: negative  (10/17/2006 10:02) Added new observation of UROBILINOGEN: 0.2  (10/17/2006 10:02) Added new observation of PROTEIN, URN: negative  (10/17/2006 10:02) Added new observation of BLOOD UR DIP: 1+  (10/17/2006 10:02) Added new observation of KETONES URN: negative  (10/17/2006 10:02) Added new observation of BILIRUBIN UR: negative  (10/17/2006 10:02) Added new observation of GLUCOSE, URN: negative  (10/17/2006 10:02)     Laboratory Results   Urine Tests   Date/Time Reported: October 17, 2006 11:18 AM   Routine Urinalysis   Color: yellow Appearance: Clear Glucose: negative   (Normal Range: Negative) Bilirubin: negative   (Normal Range: Negative) Ketone: negative   (Normal Range: Negative) Spec. Gravity: 1.015   (Normal Range: 1.003-1.035) Blood: 1+   (Normal Range: Negative) pH: 7.0   (Normal Range: 5.0-8.0) Protein: negative   (Normal Range: Negative) Urobilinogen: 0.2   (Normal Range: 0-1) Nitrite: negative   (Normal Range: Negative) Leukocyte Esterace: negative   (Normal Range: Negative)    Comments:  ..................................................................Marland KitchenWynona Canes, CMA  October 17, 2006 11:18 AM

## 2010-04-12 NOTE — Progress Notes (Signed)
Summary: simvastatin   Phone Note Refill Request Message from:  Fax from Pharmacy on March 10, 2009 3:16 PM  Refills Requested: Medication #1:  SIMVASTATIN 80 MG  TABS one daily Initial call taken by: Kern Reap CMA Duncan Dull),  March 10, 2009 3:16 PM    Prescriptions: SIMVASTATIN 80 MG  TABS (SIMVASTATIN) one daily  #100 x 1   Entered by:   Kern Reap CMA (AAMA)   Authorized by:   Roderick Pee MD   Signed by:   Kern Reap CMA (AAMA) on 03/10/2009   Method used:   Electronically to        [facility]  [phone]* (retail)       [address]       [place], [place]  [address]       Ph: [phone] or [phone]       Fax: [phone]   RxID:   [id number]

## 2010-04-12 NOTE — Letter (Signed)
Summary: Out of Work  Adult nurse at [employer]  [address]   [place], [place] [address]   Phone: [phone]  Fax: [phone]    April 07, 2008   Employee:  RUDELL ORTMAN    To Whom It May Concern:   For Medical reasons, please excuse the above named employee from work for the following dates:  Start:   April 07, 2008  End:    If you need additional information, please feel free to contact our office.         Sincerely,    Kelle Darting, MD

## 2010-04-12 NOTE — Progress Notes (Signed)
Summary: diltiazem refill  Phone Note Refill Request Message from:  Fax from Pharmacy on May 11, 2008 11:33 AM  Refills Requested: Medication #1:  DILTIAZEM HCL ER BEADS 420 MG  CP24 Take one by mouth daily Initial call taken by: Kern Reap CMA,  May 11, 2008 11:33 AM      Prescriptions: DILTIAZEM HCL ER BEADS 420 MG  CP24 (DILTIAZEM HCL ER BEADS) Take one by mouth daily  #90 x 1   Entered by:   Kern Reap CMA   Authorized by:   Roderick Pee MD   Signed by:   Kern Reap CMA on 05/11/2008   Method used:   Electronically to        [facility]  [phone]* (retail)       [address]       [place], [place]  [address]       Ph: [phone] or [phone]       Fax: [phone]   RxID:   [id number]

## 2010-04-12 NOTE — Progress Notes (Signed)
Summary: pt req lab results  Phone Note Call from Patient Call back at [phone] cell   Caller: Patient Summary of Call: Pt called req lab results.  Initial call taken by: Lucy Antigua,  December 23, 2008 12:14 PM  Follow-up for Phone Call        labs ok......Marland Kitchenwillrev. when he comes in for cpx. Follow-up by: Roderick Pee MD,  December 24, 2008 8:17 AM  Additional Follow-up for Phone Call Additional follow up Details #1::        left message on machine for patient  Additional Follow-up by: Kern Reap CMA Duncan Dull),  December 24, 2008 5:12 PM

## 2010-04-12 NOTE — Assessment & Plan Note (Signed)
Summary: 1 month rov/njr   Vital Signs:  Patient Profile:   45 Years Old Male Height:     74 inches (187.96 cm) Temp:     98.5 degrees F oral Pulse rate:   64 / minute Pulse rhythm:   regular BP sitting:   140 / 96  (left arm) Cuff size:   large  Vitals Entered By: Kern Reap CMA (January 03, 2008 10:33 AM) Weight > 350 lbs. Yes                 Chief Complaint:  follow up  blood pressure.  History of Present Illness: Laval is a 45 year old male, who comes in today for evaluation of hypertension, diabetes, obesity, and erectile dysfunction.  His fasting blood sugars are all now 100.  He takes Glucophage 500 mg b.i.d.  Blood pressure at home 130/80.  He is compliant with his medication.  His weight is down to 394 a month ago.  He was 405.  He continues to follow his diet and exercise program.  He was to discuss other options for ED besides cialis    Prior Medication List:  FUROSEMIDE 40 MG  TABS (FUROSEMIDE) 2 by mouth qam SIMVASTATIN 80 MG  TABS (SIMVASTATIN) one daily POTASSIUM CHLORIDE CRYS CR 20 MEQ  TBCR (POTASSIUM CHLORIDE CRYS CR) 2 by mouth qam DILTIAZEM HCL ER BEADS 420 MG  CP24 (DILTIAZEM HCL ER BEADS) Take one by mouth daily CIALIS 20 MG  TABS (TADALAFIL) prn VENTOLIN HFA 108 (90 BASE) MCG/ACT  AERS (ALBUTEROL SULFATE) 2 ps three times a day prn GLUCOPHAGE 500 MG TABS (METFORMIN HCL) Take 1 tablet by mouth two times a day   Current Allergies: No known allergies   Past Medical History:    Reviewed history from 11/26/2007 and no changes required:       Diabetes mellitus, type II       Hyperlipidemia       Hypertension       ASTHMA, CHILDHOOD (ICD-493.00)       OBESITY (ICD-278.00)       Peripheral vascular disease       ED   Social History:    Reviewed history from 12/06/2007 and no changes required:       Occupation:       Married       Never Smoked       Alcohol use-no       Drug use-no       Regular exercise-yes    Review of  Systems      See HPI   Physical Exam  General:     Well-developed,well-nourished,in no acute distress; alert,appropriate and cooperative throughout examination    Impression & Recommendations:  Problem # 1:  OBESITY (ICD-278.00) Assessment: Improved  Problem # 2:  HYPERTENSION (ICD-401.9) Assessment: Improved  His updated medication list for this problem includes:    Furosemide 40 Mg Tabs (Furosemide) .Marland Kitchen... 2 by mouth qam    Diltiazem Hcl Er Beads 420 Mg Cp24 (Diltiazem hcl er beads) .Marland Kitchen... Take one by mouth daily   Problem # 3:  DIABETES MELLITUS, TYPE II (ICD-250.00) Assessment: Improved  His updated medication list for this problem includes:    Glucophage 500 Mg Tabs (Metformin hcl) .Marland Kitchen... Take 1 tablet by mouth two times a day  Orders: Venipuncture (14782) TLB-BMP (Basic Metabolic Panel-BMET) (80048-METABOL) TLB-A1C / Hgb A1C (Glycohemoglobin) (83036-A1C) TLB-Microalbumin/Creat Ratio, Urine (82043-MALB)   Complete Medication List: 1)  Furosemide 40 Mg Tabs (Furosemide) .Marland KitchenMarland KitchenMarland Kitchen  2 by mouth qam 2)  Simvastatin 80 Mg Tabs (Simvastatin) .... One daily 3)  Potassium Chloride Crys Cr 20 Meq Tbcr (Potassium chloride crys cr) .... 2 by mouth qam 4)  Diltiazem Hcl Er Beads 420 Mg Cp24 (Diltiazem hcl er beads) .... Take one by mouth daily 5)  Cialis 20 Mg Tabs (Tadalafil) .... Prn 6)  Ventolin Hfa 108 (90 Base) Mcg/act Aers (Albuterol sulfate) .... 2 ps three times a day prn 7)  Glucophage 500 Mg Tabs (Metformin hcl) .... Take 1 tablet by mouth two times a day   Patient Instructions: 1)   continue current program.  Return 3 months for follow-up   ]

## 2010-04-12 NOTE — Progress Notes (Signed)
  Phone Note Outgoing Call   Call placed by: jat Call placed to: Patient Summary of Call: call patient blood sugar elevated A1c over 8, consistent with 8 average blood sugar of over 300.  Recommend follow-up office visit to discuss treatment of new diabetes Initial call taken by: Roderick Pee MD,  November 29, 2007 8:35 AM

## 2010-04-12 NOTE — Assessment & Plan Note (Signed)
Summary: moved pt to 11:30 due to double booking//db   Vital Signs:  Patient Profile:   45 Years Old Male Height:     74 inches (187.96 cm) Temp:     98.3 degrees F (36.83 degrees C) oral BP sitting:   130 / 70  (left arm)  Pt. in pain?   no  Vitals Entered By: Arcola Jansky, RN (November 27, 2006 11:58 AM) Weight > 350 lbs. Yes                  Chief Complaint:  cpx , labs done, and denies problems.  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  He also has erectile dysfunction.  He is on his way to [place] to do some volunteer work.  He did well and got his weight below 4 and apparently stuck around 385 .  We reviewed his past medical history of systems, social history, family history vaccination history at all.  Has been unchanged.  His big issue of course is again motivated to get the weight off, which affects all his other problems.  Current Allergies (reviewed today): No known allergies   Past Medical History:    Reviewed history from 10/17/2006 and no changes required:       Diabetes mellitus, type II       Hyperlipidemia       Hypertension       ASTHMA, CHILDHOOD (ICD-493.00)       OBESITY (ICD-278.00)   Family History:    Reviewed history and no changes required:  Social History:    Reviewed history and no changes required:   Risk Factors:  Alcohol use:  no Exercise:  no   Review of Systems      See HPI   Physical Exam  General:     Well-developed,well-nourished,in no acute distress; alert,appropriate and cooperative throughout examination obese Head:     Normocephalic and atraumatic without obvious abnormalities. No apparent alopecia or balding. Eyes:     No corneal or conjunctival inflammation noted. EOMI. Perrla. Funduscopic exam  benign, without hemorrhages, exudates or papilledema. Vision grossly normal. Ears:     External ear exam shows no significant lesions or deformities.  Otoscopic examination reveals clear canals, tympanic membranes are intact bilaterally without bulging, retraction, inflammation or discharge. Hearing is grossly normal bilaterally. Nose:     External nasal examination shows no deformity or inflammation. Nasal mucosa are pink and moist without lesions or exudates. Mouth:     Oral mucosa and oropharynx without lesions or exudates.  Teeth in good repair. Neck:     No deformities, masses, or tenderness noted. Chest Wall:     No deformities, masses, tenderness or gynecomastia noted. Lungs:     Normal respiratory effort, chest expands symmetrically. Lungs are clear to auscultation, no crackles or wheezes. Heart:     Normal rate and regular rhythm. S1 and S2 normal without gallop, murmur, click, rub or other extra sounds. Abdomen:     Bowel sounds positive,abdomen soft and non-tender without masses, organomegaly or hernias noted. Genitalia:     Testes bilaterally descended without nodularity, tenderness or masses. No scrotal masses or lesions. No penis lesions or urethral discharge. Msk:     No deformity or scoliosis noted of thoracic or lumbar spine.   Pulses:     R and  L carotid,radial,femoral,dorsalis pedis and posterior tibial pulses are full and equal bilaterally Extremities:     1+ left pedal edema and 1+ right pedal edema.   Neurologic:     No cranial nerve deficits noted. Station and gait are normal. Plantar reflexes are down-going bilaterally. DTRs are symmetrical throughout. Sensory, motor and coordinative functions appear intact. Skin:     Intact without suspicious lesions or rashes Cervical Nodes:     No lymphadenopathy noted Axillary Nodes:     No palpable lymphadenopathy Inguinal Nodes:     No significant adenopathy Psych:     Cognition and judgment appear intact. Alert and  cooperative with normal attention span and concentration. No apparent delusions, illusions, hallucinations    Impression & Recommendations:  Problem # 1:  HYPERTENSION (ICD-401.9) Assessment: Unchanged  His updated medication list for this problem includes:    Furosemide 40 Mg Tabs (Furosemide) .Marland Kitchen... Take one by mouth every morning    Diltiazem Hcl Er Beads 420 Mg Cp24 (Diltiazem hcl er beads) .Marland Kitchen... Take one by mouth daily  Orders: EKG w/ Interpretation (93000)   Problem # 2:  ASTHMA, CHILDHOOD (ICD-493.00) Assessment: Unchanged  His updated medication list for this problem includes:    Ventolin Hfa 108 (90 Base) Mcg/act Aers (Albuterol sulfate) .Marland Kitchen... 2 ps three times a day prn   Problem # 3:  OBESITY (ICD-278.00) Assessment: Unchanged  Problem # 4:  HYPERLIPIDEMIA (ICD-272.4) Assessment: Unchanged  His updated medication list for this problem includes:    Simvastatin 80 Mg Tabs (Simvastatin) ..... One daily   Problem # 5:  DIABETES MELLITUS, TYPE II (ICD-250.00) Assessment: Unchanged  Complete Medication List: 1)  Furosemide 40 Mg Tabs (Furosemide) .... Take one by mouth every morning 2)  Simvastatin 80 Mg Tabs (Simvastatin) .... One daily 3)  Potassium Chloride Crys Cr 20 Meq Tbcr (Potassium chloride crys cr) .... Take one tablet by mouth daily 4)  Diltiazem Hcl Er Beads 420 Mg Cp24 (Diltiazem hcl er beads) .... Take one by mouth daily 5)  Cialis 20 Mg Tabs (Tadalafil) .... Prn 6)  Ventolin Hfa 108 (90 Base) Mcg/act Aers (Albuterol sulfate) .... 2 ps three times a day prn   Patient Instructions: 1)  It is important that you exercise regularly at least 20 minutes 5 times a week. If you develop chest pain, have severe difficulty breathing, or feel very tired , stop exercising immediately and seek medical attention. 2)  You need to lose weight. Consider a lower calorie diet and regular exercise.  3)  Take an Aspirin every day. 4)  See your eye doctor yearly to check  for diabetic eye damage. 5)  Check your feet each night for sore areas, calluses or signs of infection. 6)  Check your Blood Pressure regularly. If it is above: you should make an appointment. 7)  I would also consider a consult with the general surgeon, Dr. Wenda Low.  He can sit or surgical treatments for obesity.    Prescriptions: CIALIS 20 MG  TABS (TADALAFIL) prn  #6 x 11   Entered and Authorized by:   Roderick Pee MD   Signed by:   Roderick Pee MD on 11/27/2006   Method used:   Electronically sent to ...       [facility]  [address]  [phone]       [address]       [place], [place]  [address]  Ph: [phone] or [phone]       Fax: [phone]   RxID:   [id number] DILTIAZEM HCL ER BEADS 420 MG  CP24 (DILTIAZEM HCL ER BEADS) Take one by mouth daily  #100 x 3   Entered and Authorized by:   Roderick Pee MD   Signed by:   Roderick Pee MD on 11/27/2006   Method used:   Electronically sent to ...       [facility]  [address]  [phone]       [address]       [place], [place]  [address]       Ph: [phone] or [phone]       Fax: [phone]   RxID:   [id number] POTASSIUM CHLORIDE CRYS CR 20 MEQ  TBCR (POTASSIUM CHLORIDE CRYS CR) Take one tablet by mouth daily  #100 x 3   Entered and Authorized by:   Roderick Pee MD   Signed by:   Roderick Pee MD on 11/27/2006   Method used:   Electronically sent to ...       [facility]  [address]  [phone]       [address]       [place], [place]  [address]       Ph: [phone] or [phone]       Fax: [phone]   RxID:   [id number] SIMVASTATIN 80 MG  TABS (SIMVASTATIN) one daily  #100 x 3   Entered and Authorized by:   Roderick Pee MD   Signed by:   Roderick Pee MD on 11/27/2006   Method used:   Electronically sent to ...       [facility]  [address]  [phone]       [address]       [place], [place]  [address]       Ph: [phone] or [phone]       Fax: [phone]   RxID:   [id number] FUROSEMIDE 40 MG  TABS (FUROSEMIDE) Take one by mouth every morning  #100 x 3   Entered and Authorized by:   Roderick Pee MD   Signed by:   Roderick Pee MD on 11/27/2006   Method used:   Electronically sent to ...       [facility]  [address]  [phone]       [address]       [place], [place]  [address]       Ph: [phone] or [phone]       Fax: [phone]   RxID:   [id number] VENTOLIN HFA 108 (90 BASE) MCG/ACT  AERS (ALBUTEROL SULFATE) 2 ps three times a day prn  #2 x 2   Entered and Authorized by:   Roderick Pee MD   Signed by:   Roderick Pee MD on 11/27/2006   Method used:   Electronically sent to ...       [facility]  [address]  [phone]       [address]       [place], [place]  [address]       Ph: [phone] or [phone]       Fax: [phone]  [phone]   RxID:   [id number]  ]

## 2010-04-12 NOTE — Progress Notes (Signed)
Summary: refill  Phone Note Call from Patient Call back at [phone]   Caller: pt live Call For: Tawanna Cooler Summary of Call: wants rx for glucose test strips.  He has accucheck compact machine.  [employer] [address]  Initial call taken by: Roselle Locus,  January 13, 2008 9:23 AM    New/Updated Medications: ACCU-CHEK COMPACT TEST DRUM  STRP (GLUCOSE BLOOD) test 3 times a week   Prescriptions: ACCU-CHEK COMPACT TEST DRUM  STRP (GLUCOSE BLOOD) test 3 times a week  #90 x 3   Entered by:   Kern Reap CMA   Authorized by:   Roderick Pee MD   Signed by:   Kern Reap CMA on 01/13/2008   Method used:   Electronically to        [facility]  [phone]* (retail)       [address]       [place], [place]  [address]       Ph: [phone] or [phone]       Fax: [phone]   RxID:   [id number]

## 2010-04-14 NOTE — Assessment & Plan Note (Signed)
Summary: eye issues//ccm   Vital Signs:  Patient profile:   45 year old male Height:      72 inches Weight:      407 pounds BMI:     55.40 O2 Sat:      97 % on Room air Temp:     98.0 degrees F oral Pulse rate:   86 / minute BP sitting:   160 / 106  (left arm) Cuff size:   large  Vitals Entered By: Payton Spark CMA (March 19, 2010 9:44 AM)  O2 Flow:  Room air CC: B eye drainage and irritation x 2 days   CC:  B eye drainage and irritation x 2 days.  History of Present Illness: 45 y/o m m ns w 3 days h/o of yellow d/c from r/l eyes.....kids are sick w same.....ros neg  Allergies: No Known Drug Allergies  Past History:  Past medical, surgical, family and social histories (including risk factors) reviewed for relevance to current acute and chronic problems.  Past Medical History: Reviewed history from 01/03/2008 and no changes required. Diabetes mellitus, type II Hyperlipidemia Hypertension ASTHMA, CHILDHOOD (ICD-493.00) OBESITY (ICD-278.00) Peripheral vascular disease ED  Past Surgical History: Reviewed history from 10/17/2006 and no changes required. Hernia Repair  Family History: Reviewed history from 12/06/2007 and no changes required. Family History Diabetes 1st degree relative Family History Hypertension  Social History: Reviewed history from 12/06/2007 and no changes required. Occupation: Married Never Smoked Alcohol use-no Drug use-no Regular exercise-yes  Review of Systems      See HPI  Physical Exam  General:  Well-developed,well-nourished,in no acute distress; alert,appropriate and cooperative throughout examination Head:  Normocephalic and atraumatic without obvious abnormalities. No apparent alopecia or balding. Eyes:  No corneal or conjunctival inflammation noted. EOMI. Perrla. Funduscopic exam benign, without hemorrhages, exudates or papilledema. Vision grossly normal. Ears:  External ear exam shows no significant lesions or  deformities.  Otoscopic examination reveals clear canals, tympanic membranes are intact bilaterally without bulging, retraction, inflammation or discharge. Hearing is grossly normal bilaterally. Nose:  External nasal examination shows no deformity or inflammation. Nasal mucosa are pink and moist without lesions or exudates. Mouth:  Oral mucosa and oropharynx without lesions or exudates.  Teeth in good repair.   Problems:  Medical Problems Added: 1)  Dx of Conjunctivitis  (ICD-372.30)  Impression & Recommendations:  Problem # 1:  CONJUNCTIVITIS (ICD-372.30) Assessment New  His updated medication list for this problem includes:    Genoptic 0.3 % Soln (Gentamicin sulfate) ..... One drop each eye 3 x day until clear  Complete Medication List: 1)  Potassium Chloride Crys Cr 20 Meq Tbcr (Potassium chloride crys cr) .... 2 by mouth qam 2)  Diltiazem Hcl Er Beads 420 Mg Cp24 (Diltiazem hcl er beads) .... Take one by mouth daily 3)  Cialis 20 Mg Tabs (Tadalafil) .... Prn 4)  Ventolin Hfa 108 (90 Base) Mcg/act Aers (Albuterol sulfate) .... 2 ps three times a day prn 5)  Glucophage 500 Mg Tabs (Metformin hcl) .... Take 1 tablet by mouth two times a day 6)  Accu-chek Compact Test Drum Strp (Glucose blood) .... Test 3 times a week 7)  Lipitor 20 Mg Tabs (Atorvastatin calcium) .Marland Kitchen.. 1 tab @ bedtime 8)  Vicodin Es 7.5-750 Mg Tabs (Hydrocodone-acetaminophen) .... Take 1 tablet by mouth four times a day as needed pain 9)  Genoptic 0.3 % Soln (Gentamicin sulfate) .... One drop each eye 3 x day until clear  Patient Instructions: 1)  gentamycin eye drops.one drop 3 x days until clear Prescriptions: GENOPTIC 0.3 % SOLN (GENTAMICIN SULFATE) one drop each eye 3 x day until clear  #10 cc x 1   Entered and Authorized by:   Roderick Pee MD   Signed by:   Roderick Pee MD on 03/19/2010   Method used:   Electronically to        [facility]  [phone]* (retail)       [address]        [place], [place]  [address]       Ph: [phone] or [phone]       Fax: [phone]   RxID:   [id number]    Orders Added: 1)  Est. Patient Level III [36644]

## 2010-04-20 ENCOUNTER — Ambulatory Visit (INDEPENDENT_AMBULATORY_CARE_PROVIDER_SITE_OTHER): Payer: BC Managed Care – PPO | Admitting: Family Medicine

## 2010-04-20 ENCOUNTER — Encounter: Payer: Self-pay | Admitting: Family Medicine

## 2010-04-20 DIAGNOSIS — N451 Epididymitis: Secondary | ICD-10-CM

## 2010-04-20 DIAGNOSIS — N509 Disorder of male genital organs, unspecified: Secondary | ICD-10-CM

## 2010-04-20 DIAGNOSIS — IMO0002 Reserved for concepts with insufficient information to code with codable children: Secondary | ICD-10-CM

## 2010-04-20 LAB — POCT URINALYSIS DIPSTICK
Bilirubin, UA: NEGATIVE
Glucose, UA: NEGATIVE
Ketones, UA: NEGATIVE
Leukocytes, UA: NEGATIVE
Nitrite, UA: NEGATIVE
Protein, UA: NEGATIVE
Spec Grav, UA: 1.015
Urobilinogen, UA: 0.2
pH, UA: 7

## 2010-04-20 MED ORDER — DOXYCYCLINE HYCLATE 100 MG PO CAPS: 100.0000 mg | ORAL_CAPSULE | Freq: Two times a day (BID) | ORAL | Status: AC

## 2010-04-20 NOTE — Progress Notes (Signed)
  Subjective:    Patient ID: Raymond Lutz, male    DOB: 01-11-1966, 45 y.o.   MRN: [id number]  HPI Pt seen with onset last night of L inguinal pain radiating to  Left scrotal region. Patient relates quality pain is achy to sharp. Intermittent pain. Severe at times. He has not noted any fever, dysuria, rash or any visible swelling. Remote history of right inguinal hernia repair in childhood. Denies abdominal pain or back pain. No history of kidney stones. No gross hematuria.   Review of Systems     Objective:   Physical Exam     No fever or chills. Patient alert and in no distress  Chest clear to auscultation  Heart regular rhythm and rate with no murmur  abdomen soft and nontender  Genitourinary exam reveals tenderness left testicle epididymis but no swelling. Right testicle is nontender. No inguinal mass noted. Skin no rash.   Assessment & Plan:   probable left epididymitis  urine small blood otherwise negative Doxycycline 100 mg po bid for 10 days.  Follow up with Dr Tawanna Cooler and repeat UA then.

## 2010-04-20 NOTE — Patient Instructions (Signed)
Follow up with Dr Tawanna Cooler as scheduled and sooner as needed if pain not improving. Follow up sooner for any blood in urine, fever, or worsening pain

## 2010-06-06 ENCOUNTER — Other Ambulatory Visit (INDEPENDENT_AMBULATORY_CARE_PROVIDER_SITE_OTHER): Payer: BC Managed Care – PPO | Admitting: Family Medicine

## 2010-06-06 DIAGNOSIS — E119 Type 2 diabetes mellitus without complications: Secondary | ICD-10-CM

## 2010-06-06 LAB — BASIC METABOLIC PANEL
BUN: 15 mg/dL (ref 6–23)
CO2: 27 mEq/L (ref 19–32)
Calcium: 9.3 mg/dL (ref 8.4–10.5)
Chloride: 104 mEq/L (ref 96–112)
Creatinine, Ser: 0.7 mg/dL (ref 0.4–1.5)
GFR: 152.98 mL/min (ref >60.00)
Glucose, Bld: 122 mg/dL — ABNORMAL HIGH (ref 70–99)
Potassium: 4.4 mEq/L (ref 3.5–5.1)
Sodium: 138 mEq/L (ref 135–145)

## 2010-06-06 LAB — HEMOGLOBIN A1C: Hgb A1c MFr Bld: 7 % — ABNORMAL HIGH (ref 4.6–6.5)

## 2010-06-10 ENCOUNTER — Encounter: Payer: Self-pay | Admitting: Family Medicine

## 2010-06-13 ENCOUNTER — Encounter: Payer: Self-pay | Admitting: Family Medicine

## 2010-06-13 ENCOUNTER — Ambulatory Visit (INDEPENDENT_AMBULATORY_CARE_PROVIDER_SITE_OTHER): Payer: BC Managed Care – PPO | Admitting: Family Medicine

## 2010-06-13 DIAGNOSIS — I10 Essential (primary) hypertension: Secondary | ICD-10-CM

## 2010-06-13 DIAGNOSIS — E119 Type 2 diabetes mellitus without complications: Secondary | ICD-10-CM

## 2010-06-13 MED ORDER — METFORMIN HCL 1000 MG PO TABS: ORAL_TABLET | ORAL | Status: DC

## 2010-06-13 NOTE — Progress Notes (Signed)
  Subjective:    Patient ID: Raymond Lutz, male    DOB: 21-Apr-1965, 45 y.o.   MRN: [id number]  HPIPatrick is a 45 year old male, nonsmoker, who comes in today for follow-up of diabetes and hypertension.  Recently, his blood sugars when going up.  His fasting was 122, A1c7 .0%.  A1 C6 months ago, was 6.6.  He admits to not taking his medication as prescribed.  He is only taking thousand milligrams prior to breakfast and he drinks sodas and sweet tea every day.  We talked about the long-term negative impact of high blood sugar on his vascular tree, including heart attack, stroke, renal failure, et Karie Soda.    Review of Systems      General cardiovascular review of systems otherwise negative Objective:   Physical Exam    Well-developed well-nourished, male, obese, no acute distress    Assessment & Plan:  Diabetes type 2, under poor control,,,,,,,,,, diet exercise, weight loss.  No sodas, metformin 1000 mg in the morning, 500 prior to evening meal.  Follow-up A1c in two months.  Blood pressure also not under good control,,,,,,,,,, continue current blood pressure medication daily blood pressure and blood sugar checks.  Follow-up in two months

## 2010-06-13 NOTE — Patient Instructions (Signed)
Take 1000 mg of metformin prior to breakfast and 500 mg prior to evening meal.  Cut out all the sugar.  Walk 15 minutes daily.  Check a blood sugar and blood pressure daily.  Return in two months for follow-up,,,,,,,,,,, fasting lab work one week prior
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
## 2010-07-29 NOTE — Op Note (Signed)
Raymond Lutz, Raymond Lutz              ACCOUNT NO.:  [id number]   MEDICAL RECORD NO.:  [id number]          PATIENT TYPE:  AMB   LOCATION:  DAY                          FACILITY:  [facility]   PHYSICIAN:  Georges Lynch. Gioffre, M.D.DATE OF BIRTH:  1966-02-22   DATE OF PROCEDURE:  12/23/2003  DATE OF DISCHARGE:                                 OPERATIVE REPORT   PREOPERATIVE DIAGNOSES:  1.  Degenerative arthritis, left knee.  2.  Torn medial meniscus posterior horn, left knee.   POSTOPERATIVE DIAGNOSIS:  1.  Degenerative arthritis, left knee.  2.  Torn medial meniscus posterior horn, left knee.   PROCEDURE:  1.  Diagnostic laparoscopy, left knee.  2.  Partial medial meniscectomy, left knee.  3.  Synovectomy, medial joint space, left knee.   SURGEON:  Georges Lynch. Gioffre, M.D.   DESCRIPTION OF PROCEDURE:  Under general anesthesia, a routine orthopedic  prep and drape in the left lower extremity was carried out.  The patient had  1 g of IV Ancef.  Sterile prep and drape was carried out of his left knee.  Following this, a small incision was made in the suprapatellar pouch and  the flow cannula was inserted.  The knee was distended with saline.  Following that, another small incision was made in the lateral joint space  and the arthroscope was entered.  A complete diagnostic arthroscopy was  carried out.  I made another small incision in the medial joint side, and we  went ahead and inserted the shaver suction device and did a synovectomy in  the medial joint space and also excised a small bucket handle tear of the  posterior aspect of the medial meniscus.  We thoroughly irrigated out the  debris, and the remaining part of the knee was fine except for some  degenerative arthritic changes in the medial femoral condyle and also the  lateral tibial plateau.  The patient weighs approximately 400 pounds.  I  thoroughly irrigated out his knee, removed the fluid and closed all of the  incisions with 3-0  nylon suture.  I injected 35 mL of 0.5% Marcaine and  epinephrine in the knee joint and sterile Neosporin.  Bundle dressing was  applied.  I am going to keep him overnight because of his sleep apnea.  This  was recommended strongly by the anesthesiologist.      RAG/MEDQ  D:  12/23/2003  T:  12/23/2003  Job:  [id number]

## 2010-07-29 NOTE — H&P (Signed)
NAMEYANDIEL, BERGUM NO.:  [id number]   MEDICAL RECORD NO.:  [id number]          PATIENT TYPE:  EMS   LOCATION:  MAJO                         FACILITY:  [facility]   PHYSICIAN:  Thomas C. Wall, M.D.   DATE OF BIRTH:  12/18/1965   DATE OF ADMISSION:  10/28/2004  DATE OF DISCHARGE:                                HISTORY & PHYSICAL   CHIEF COMPLAINT:  Palpitations and chest discomfort.   HISTORY OF PRESENT ILLNESS:  Mr. Sako is a 45 year old married black male  originally from [place], [place], [place] with a long history  of hypertension and hyperlipidemia and obesity.   He presented, he woke up at 4 a.m. this morning with the above symptoms.  He  went to Dr. Nelida Meuse office, found to be in atrial fibrillation with a rapid  rate of 115-130 beats per minute.  There were no acute ST segment changes.   Here in the emergency room, he has been given IV Diltiazem by me 20 mg, then  followed by another 10 mg and then p.o. 360.  He slowed to about 80-90 and  had some sinus beats.  He is still however, staying in atrial fibrillation  the most of the time.  He is being admitted for anticoagulation and further  evaluation.   PAST MEDICAL HISTORY:  He has a history of hypertension, hyperlipidemia,  obesity.   He has no known drug allergies.   SOCIAL HISTORY:  He is married and his wife is here Quarry manager.  He has two  children, both here in the emergency room and very well behaved, I will add.  He is a Production designer, theatre/television/film at [employer].   He does not smoke or drink.   FAMILY HISTORY:  No premature history of coronary disease.  There is a  history of hypertension and hyperlipidemia.   REVIEW OF SYSTEMS:  There is no history of bleeding or ulcer disease.   The rest of his review of systems is negative.   PHYSICAL EXAMINATION:  VITAL SIGNS:  His blood pressure is 150/84, his pulse  is now 80 and irregular.  He is in atrial fibrillation.   Respiratory rate is  16.  O2 saturation is 98% on room air.  Temperature is 97.8.  GENERAL:  He is in no acute distress.  SKIN:  Warm and dry.  HEENT:  PERRLA.  Extraocular movements intact.  Sclerae are clear.  Dentition is normal.  NECK:  Carotid strokes are equal bilaterally without bruits.  No JVD.  Thyroid is not enlarged.  Trachea is midline.  HEART:  Reveals an irregular rate and rhythm without murmurs, rubs or  gallops.  ABDOMEN:  Good bowel sounds.  Organomegaly could not be assessed because of  morbid obesity.  EXTREMITIES:  No cyanosis, clubbing or edema.  Pulses are intact.  NEUROLOGICAL:  Exam is intact.   LABORATORY DATA:  At this point shows a potassium of 4.1, normal BUN and  creatinine, hemoglobin of 13.8.  Point of care markers are negative x1.   ASSESSMENT:  Paroxysmal atrial fibrillation, new onset with  a rapid  ventricular response and symptomatic.  He persists despite intravenous  Diltiazem and p.o. Diltiazem in the emergency room.  His underlying risk  factors are hypertension and his obesity.   PLAN:  1.  Admit for IV Cardizem and attempted conversion to sinus rhythm over the      next 24-48 hours.  2.  Anticoagulation with heparin and begin Coumadin per pharmacy.  3.  Continue other medications.  4.  Check TSH, PT and PTT.  5.  Chest x-ray for baseline.   If he converts to sinus rhythm over the weekend, he can be discharged home  with follow up in the office.  He will need a 2D echocardiogram at some  point.   I have discussed this with the patient and his wife and they understand and  agree with the plan.      Thomas C. Wall, M.D.  Electronically Signed     TCW/MEDQ  D:  10/28/2004  T:  10/29/2004  Job:  [id number]   cc:   Tinnie Gens A. Tawanna Cooler, M.D. [facility]  [address] [place]  [place] [address]

## 2010-07-29 NOTE — Discharge Summary (Signed)
NAMEJOSHAUA, EPPLE NO.:  [id number]   MEDICAL RECORD NO.:  [id number]           PATIENT TYPE:   LOCATION:  1831                         FACILITY:  [facility]   PHYSICIAN:  Dorian Pod, NP    DATE OF BIRTH:  1965/08/12   DATE OF ADMISSION:  10/28/2004  DATE OF DISCHARGE:  10/29/2004                                 DISCHARGE SUMMARY   FAMILY PHYSICIAN:  Tinnie Gens A. Tawanna Cooler, MD, [facility].   DISCHARGE DIAGNOSES:  1.  New onset paroxysmal atrial fibrillation with rapid ventricular      response.  2.  New initiation of anticoagulation therapy with Coumadin.   PAST MEDICAL HISTORY:  1.  Hypertension.  2.  Hyperlipidemia.  3.  Morbid obesity.   HISTORY OF PRESENT ILLNESS:  Mr. Raymond Lutz is a 45 year old, African-American  gentleman with a long history of hypertension, hyperlipidemia, and obesity,  who awoke around 4 a.m. on August 18th with palpitations and chest  discomfort.  He was initially found to be in atrial-fib with a rapid rate of  115 to 130 without acute ST-segment changes on EKG.  In the emergency room,  he was given IV Diltiazem 20 mg followed by another 10 mg, and then 360 mg  p.o.  The patient slowed to a heart rate of 80 to 90, however, continued to  stay in atrial fibrillation.  He was evaluated by Pharmacy for IV heparin  and a drip was initiated.  It was decided to observe patient and attempts to  return to normal sinus rhythm with chemical conversion.   EMERGENCY DEPARTMENT COURSE:  In the meantime, baseline lab work was  checked; initial CBC showing a white blood cell count of 6.2, hemoglobin  13.8, with hematocrit 41, platelet count 329,000.  Sodium 136, potassium  4.1, glucose 99, BUN 9 with a creatinine of 1.0.  Cardiac markers:  Troponin  less than 0.05 x2, baseline PT 14.3 with an INR of 1.1.  TSH pending.  Chest  x-ray showing no acute disease.  Lungs were clear.  Patient kept in  emergency room for observation and eventually converted to normal  sinus  rhythm on Diltiazem 360 mg p.o.  Dr. Angelina Sheriff in to see patient.   DISCHARGE DISPOSITION:  Patient to be discharged home with Diltiazem 360 mg  p.o. a day.  Zestril on hold.  Will monitor blood pressure outpatient.  We  will continue aspirin 325 mg daily, Lasix 40 mg daily, Zocor 40 mg daily, K-  Dur 20 mEq daily.  Patient has been given a prescription for his Lasix,  potassium, Cardizem, and Warfarin.  I have instructed him to take 5 mg of  Warfarin today and 5 mg on Sunday.  He will take 2.5 mg on Monday, and  hopefully our office will call him early Monday and schedule an initial  Coumadin Clinic appointment.  I have also left a message with our office to  call patient to schedule a followup appointment with Dr. Daleen Squibb in 1 to 2  weeks and also to schedule the patient for an echocardiogram in our office  next week.  Coumadin education will be initiated via  Shelby Dubin.  Patient has been instructed to hold his Zestril until blood  pressure reevaluated in the office.  Mr. Pinder is being discharged home  from the emergency room with discharge instructions as stated above  including medications, followup appointments, and dosing on his Coumadin.      Dorian Pod, NP     MB/MEDQ  D:  10/29/2004  T:  10/29/2004  Job:  [id number]   cc:   Tinnie Gens A. Tawanna Cooler, M.D. [facility]  [address] [place]  [place] [address]

## 2010-07-29 NOTE — Discharge Summary (Signed)
. [facility]  Patient:    Raymond Lutz, Raymond Lutz Visit Number: [id number] MRN: [id number]          Service Type: MED Location: [phone] Attending Physician:  Junious Silk Dictated by:   Rozell Searing, P.A. Admit Date:  05/12/2001 Discharge Date: 05/13/2001   CC:         Evette Georges, M.D. [facility]   Discharge Summary  PROCEDURE: Exercise stress Cardiolite, May 13, 3001.  REASON FOR ADMISSION: Please refer to dictated admission note.  LABORATORY DATA: CBC normal.  D-dimer less than 0.22.  Sodium 137, potassium 3.3, glucose 103, BUN 12, creatinine 1.0.  Cardiac enzymes - peak CPK 944/6.6 (0.7%), troponin I 0.01, less than 0.01.  Drug screen negative.  HOSPITAL COURSE: The patient was admitted for further evaluation of chest pain, which was felt to be atypical for ischemic heart disease.  Serial cardiac enzymes were negative for myocardial infarction.  The patient did have elevated total CPK, but normal MB fraction as well as troponin I markers.  He was thus referred for exercise stress Cardiolite testing.  The patient underwent routine treadmill testing as per Bruce protocol, achieving maximum heart rate of 160 (86% of PMI, 9.2 METS).  He had an exaggerated blood pressure response with peak blood pressure of 210/90, which returned to 160/68 at the end of the test.  He did report some chest tightness early during the procedure which developed into chest heaviness in recovery. However, no significant ST abnormalities were noted.  Subsequent perfusion imaging revealed no evidence of ischemia, with question of inferior wall attenuation, calculated ejection fraction 47%.  Given the decreased ejection fraction by Cardiolite imaging, recommendation is to have a follow-up outpatient 2D echocardiogram.  We will otherwise continue previous home medication regimen, with the addition of low-dose potassium supplement for the noted hypokalemia on  presentation.  DISCHARGE MEDICATIONS:  1. Enteric-coated aspirin 81 mg q.d.  2. Lasix 20 mg q.d.  3. Lisinopril 20 mg q.d.  4. K-Dur 10 mEq q.d.  5. Albuterol inhaler as directed.  FOLLOW-UP: The patient is scheduled for a 2D echocardiogram at [facility] on May 20, 2001 at 2 p.m.  He is scheduled to follow up with me on May 22, 2001 at 2 p.m.  He will also have a follow-up metabolic profile checked at that time.  He will then return to me for follow-up on May 22, 2001 at 3 p.m.  The patient is also instructed to schedule a follow-up appointment with his primary care physician, Dr. Kelle Darting, in the ensuing 1-2 weeks.  We will have him follow up with Dr. Jens Som on an as needed basis.  DISCHARGE DIAGNOSES:  1. Nonischemic chest pain.     a. Negative serial cardiac enzymes.     b. Nonischemic adequate exercise stress Cardiolite, May 13, 2001.  2. Mild hypokalemia.  3. Hypertension.  4. Asthma. Dictated by:   Rozell Searing, P.A. Attending Physician:  Junious Silk DD:  05/13/01 TD:  05/14/01 Job: [id number]

## 2010-08-15 ENCOUNTER — Other Ambulatory Visit (INDEPENDENT_AMBULATORY_CARE_PROVIDER_SITE_OTHER): Payer: BC Managed Care – PPO

## 2010-08-15 DIAGNOSIS — E119 Type 2 diabetes mellitus without complications: Secondary | ICD-10-CM

## 2010-08-15 LAB — BASIC METABOLIC PANEL
BUN: 12 mg/dL (ref 6–23)
CO2: 28 mEq/L (ref 19–32)
Calcium: 9.1 mg/dL (ref 8.4–10.5)
Chloride: 102 mEq/L (ref 96–112)
Creatinine, Ser: 0.8 mg/dL (ref 0.4–1.5)
GFR: 137.32 mL/min (ref >60.00)
Glucose, Bld: 136 mg/dL — ABNORMAL HIGH (ref 70–99)
Potassium: 4 mEq/L (ref 3.5–5.1)
Sodium: 139 mEq/L (ref 135–145)

## 2010-08-15 LAB — HEMOGLOBIN A1C: Hgb A1c MFr Bld: 7.1 % — ABNORMAL HIGH (ref 4.6–6.5)

## 2010-08-29 ENCOUNTER — Encounter: Payer: Self-pay | Admitting: Family Medicine

## 2010-08-29 ENCOUNTER — Ambulatory Visit (INDEPENDENT_AMBULATORY_CARE_PROVIDER_SITE_OTHER): Payer: BC Managed Care – PPO | Admitting: Family Medicine

## 2010-08-29 VITALS — BP 142/92 | Temp 98.1°F

## 2010-08-29 DIAGNOSIS — E119 Type 2 diabetes mellitus without complications: Secondary | ICD-10-CM

## 2010-08-29 MED ORDER — METFORMIN HCL 1000 MG PO TABS: ORAL_TABLET | ORAL | Status: DC

## 2010-08-29 NOTE — Patient Instructions (Signed)
Increasing metformin 1000 mg twice daily.  Walk 15 minutes daily.  Resume diabetic diet.  Follow-up in 3 months.  A1c1 week prior

## 2010-08-29 NOTE — Progress Notes (Signed)
  Subjective:    Patient ID: Raymond Lutz, male    DOB: Aug 03, 1965, 45 y.o.   MRN: [id number]  HPIPatrick is a delightful, 45 year old male, who comes in today for evaluation of two issues.  He has type 2 diabetes and is on metformin 1000 mg per to breakfast, 500 mg prior to his evening meal.  In the past.  His blood sugar has been fairly normal however, now his blood sugars are up in the 136 range and his hemoglobin A1c is now 7.1%.  His brother has early leukemia and he would like blood drawn to be evaluated as a potential donor    Review of Systems General and metabolic review of systems otherwise negative    Objective:   Physical Exam     nourished, male in no acute distress    Assessment & Plan:  Diabetes type II, not at goal,,,,,,,, increase metformin 1000 b.i.d., walk daily, follow diet.  Return in 3 months for follow-up.  A1c

## 2010-12-02 ENCOUNTER — Other Ambulatory Visit: Payer: BC Managed Care – PPO

## 2010-12-08 ENCOUNTER — Ambulatory Visit: Payer: BC Managed Care – PPO | Admitting: Family Medicine

## 2010-12-19 ENCOUNTER — Other Ambulatory Visit: Payer: Self-pay | Admitting: Family Medicine

## 2011-01-09 ENCOUNTER — Other Ambulatory Visit: Payer: Self-pay | Admitting: Family Medicine

## 2011-05-02 ENCOUNTER — Ambulatory Visit (INDEPENDENT_AMBULATORY_CARE_PROVIDER_SITE_OTHER): Payer: BC Managed Care – PPO | Admitting: Family Medicine

## 2011-05-02 ENCOUNTER — Other Ambulatory Visit: Payer: Self-pay

## 2011-05-02 ENCOUNTER — Inpatient Hospital Stay (HOSPITAL_COMMUNITY)
Admission: EM | Admit: 2011-05-02 | Discharge: 2011-05-04 | DRG: 124 | Disposition: A | Discharge status: Home / Self Care | Payer: BC Managed Care – PPO | Source: Ambulatory Visit | Attending: Internal Medicine | Admitting: Internal Medicine

## 2011-05-02 ENCOUNTER — Encounter (HOSPITAL_COMMUNITY): Payer: Self-pay

## 2011-05-02 ENCOUNTER — Emergency Department (HOSPITAL_COMMUNITY): Payer: BC Managed Care – PPO

## 2011-05-02 ENCOUNTER — Encounter: Payer: Self-pay | Admitting: Family Medicine

## 2011-05-02 DIAGNOSIS — Z833 Family history of diabetes mellitus: Secondary | ICD-10-CM

## 2011-05-02 DIAGNOSIS — Z79899 Other long term (current) drug therapy: Secondary | ICD-10-CM

## 2011-05-02 DIAGNOSIS — Z8249 Family history of ischemic heart disease and other diseases of the circulatory system: Secondary | ICD-10-CM

## 2011-05-02 DIAGNOSIS — I10 Essential (primary) hypertension: Secondary | ICD-10-CM

## 2011-05-02 DIAGNOSIS — I200 Unstable angina: Secondary | ICD-10-CM | POA: Diagnosis present

## 2011-05-02 DIAGNOSIS — E119 Type 2 diabetes mellitus without complications: Secondary | ICD-10-CM

## 2011-05-02 DIAGNOSIS — I739 Peripheral vascular disease, unspecified: Secondary | ICD-10-CM | POA: Diagnosis present

## 2011-05-02 DIAGNOSIS — I2510 Atherosclerotic heart disease of native coronary artery without angina pectoris: Secondary | ICD-10-CM

## 2011-05-02 DIAGNOSIS — G4733 Obstructive sleep apnea (adult) (pediatric): Secondary | ICD-10-CM | POA: Diagnosis present

## 2011-05-02 DIAGNOSIS — R079 Chest pain, unspecified: Secondary | ICD-10-CM

## 2011-05-02 DIAGNOSIS — K219 Gastro-esophageal reflux disease without esophagitis: Secondary | ICD-10-CM | POA: Diagnosis present

## 2011-05-02 DIAGNOSIS — Z7982 Long term (current) use of aspirin: Secondary | ICD-10-CM

## 2011-05-02 DIAGNOSIS — E785 Hyperlipidemia, unspecified: Secondary | ICD-10-CM | POA: Insufficient documentation

## 2011-05-02 DIAGNOSIS — Z801 Family history of malignant neoplasm of trachea, bronchus and lung: Secondary | ICD-10-CM

## 2011-05-02 DIAGNOSIS — I4430 Unspecified atrioventricular block: Secondary | ICD-10-CM | POA: Clinically undetermined

## 2011-05-02 DIAGNOSIS — R071 Chest pain on breathing: Secondary | ICD-10-CM

## 2011-05-02 DIAGNOSIS — I440 Atrioventricular block, first degree: Secondary | ICD-10-CM | POA: Diagnosis present

## 2011-05-02 HISTORY — DX: Paroxysmal atrial fibrillation: I48.0

## 2011-05-02 HISTORY — DX: Gastro-esophageal reflux disease without esophagitis: K21.9

## 2011-05-02 HISTORY — DX: Unspecified osteoarthritis, unspecified site: M19.90

## 2011-05-02 HISTORY — DX: Shortness of breath: R06.02

## 2011-05-02 LAB — CBC
HCT: 41.8 % (ref 39.0–52.0)
Hemoglobin: 14.3 g/dL (ref 13.0–17.0)
MCH: 27.8 pg (ref 26.0–34.0)
MCHC: 34.2 g/dL (ref 30.0–36.0)
MCV: 81.3 fL (ref 78.0–100.0)
Platelets: 317 10*3/uL (ref 150–400)
RBC: 5.14 MIL/uL (ref 4.22–5.81)
RDW: 14.4 % (ref 11.5–15.5)
WBC: 6.2 10*3/uL (ref 4.0–10.5)

## 2011-05-02 LAB — BASIC METABOLIC PANEL
BUN: 13 mg/dL (ref 6–23)
CO2: 27 mEq/L (ref 19–32)
Calcium: 10.1 mg/dL (ref 8.4–10.5)
Chloride: 103 mEq/L (ref 96–112)
Creatinine, Ser: 0.72 mg/dL (ref 0.50–1.35)
GFR calc Af Amer: >90 mL/min (ref >90)
GFR calc non Af Amer: >90 mL/min (ref >90)
Glucose, Bld: 119 mg/dL — ABNORMAL HIGH (ref 70–99)
Potassium: 3.7 mEq/L (ref 3.5–5.1)
Sodium: 140 mEq/L (ref 135–145)

## 2011-05-02 LAB — CARDIAC PANEL(CRET KIN+CKTOT+MB+TROPI)
CK, MB: 2.9 ng/mL (ref 0.3–4.0)
CK, MB: 2.6 ng/mL (ref 0.3–4.0)
Relative Index: 1 (ref 0.0–2.5)
Relative Index: 1.1 (ref 0.0–2.5)
Total CK: 278 U/L — ABNORMAL HIGH (ref 7–232)
Total CK: 227 U/L (ref 7–232)
Troponin I: <0.3 ng/mL (ref <0.30)
Troponin I: <0.3 ng/mL (ref <0.30)

## 2011-05-02 LAB — GLUCOSE, CAPILLARY: Glucose-Capillary: 134 mg/dL — ABNORMAL HIGH (ref 70–99)

## 2011-05-02 LAB — PROTIME-INR
INR: 1 (ref 0.00–1.49)
Prothrombin Time: 13.4 seconds (ref 11.6–15.2)

## 2011-05-02 LAB — MAGNESIUM: Magnesium: 1.9 mg/dL (ref 1.5–2.5)

## 2011-05-02 MED ORDER — ASPIRIN EC 81 MG PO TBEC
81.0000 mg | DELAYED_RELEASE_TABLET | Freq: Every day | ORAL | Status: DC
  Administered 2011-05-04: 81 mg via ORAL
  Filled 2011-05-02 (×2): qty 1

## 2011-05-02 MED ORDER — INSULIN ASPART 100 UNIT/ML ~~LOC~~ SOLN: 0.0000 [IU] | Freq: Every day | SUBCUTANEOUS | Status: DC

## 2011-05-02 MED ORDER — ASPIRIN 81 MG PO CHEW
324.0000 mg | CHEWABLE_TABLET | ORAL | Status: AC
  Administered 2011-05-03: 324 mg via ORAL
  Filled 2011-05-02: qty 4

## 2011-05-02 MED ORDER — ZOLPIDEM TARTRATE 5 MG PO TABS: 10.0000 mg | ORAL_TABLET | Freq: Every evening | ORAL | Status: DC | PRN

## 2011-05-02 MED ORDER — SODIUM CHLORIDE 0.9 % IJ SOLN
3.0000 mL | Freq: Two times a day (BID) | INTRAMUSCULAR | Status: DC
  Administered 2011-05-02 – 2011-05-04 (×3): 3 mL via INTRAVENOUS

## 2011-05-02 MED ORDER — SODIUM CHLORIDE 0.9 % IV SOLN: 250.0000 mL | INTRAVENOUS | Status: DC | PRN

## 2011-05-02 MED ORDER — HEPARIN SOD (PORCINE) IN D5W 100 UNIT/ML IV SOLN
2200.0000 [IU]/h | INTRAVENOUS | Status: DC
  Administered 2011-05-02: 1700 [IU]/h via INTRAVENOUS
  Administered 2011-05-03: 2200 [IU]/h via INTRAVENOUS
  Filled 2011-05-02 (×4): qty 250

## 2011-05-02 MED ORDER — ASPIRIN 81 MG PO CHEW
324.0000 mg | CHEWABLE_TABLET | ORAL | Status: AC
  Administered 2011-05-02: 324 mg via ORAL
  Filled 2011-05-02: qty 4

## 2011-05-02 MED ORDER — NITROGLYCERIN 2 % TD OINT
1.0000 [in_us] | TOPICAL_OINTMENT | Freq: Four times a day (QID) | TRANSDERMAL | Status: DC
  Administered 2011-05-02: 1 [in_us] via TOPICAL
  Filled 2011-05-02: qty 1

## 2011-05-02 MED ORDER — ALPRAZOLAM 0.25 MG PO TABS: 0.2500 mg | ORAL_TABLET | Freq: Two times a day (BID) | ORAL | Status: DC | PRN

## 2011-05-02 MED ORDER — NITROGLYCERIN 0.4 MG SL SUBL: 0.4000 mg | SUBLINGUAL_TABLET | SUBLINGUAL | Status: DC | PRN

## 2011-05-02 MED ORDER — INSULIN ASPART 100 UNIT/ML ~~LOC~~ SOLN
0.0000 [IU] | Freq: Three times a day (TID) | SUBCUTANEOUS | Status: DC
  Administered 2011-05-03: 2 [IU] via SUBCUTANEOUS
  Administered 2011-05-03 (×2): 3 [IU] via SUBCUTANEOUS
  Administered 2011-05-04: 2 [IU] via SUBCUTANEOUS
  Filled 2011-05-02: qty 3

## 2011-05-02 MED ORDER — ONDANSETRON HCL 4 MG/2ML IJ SOLN: 4.0000 mg | Freq: Four times a day (QID) | INTRAMUSCULAR | Status: DC | PRN

## 2011-05-02 MED ORDER — SODIUM CHLORIDE 0.9 % IJ SOLN
3.0000 mL | Freq: Two times a day (BID) | INTRAMUSCULAR | Status: DC
  Administered 2011-05-03: 3 mL via INTRAVENOUS

## 2011-05-02 MED ORDER — SODIUM CHLORIDE 0.9 % IV SOLN
INTRAVENOUS | Status: DC
  Administered 2011-05-03: 04:00:00 via INTRAVENOUS

## 2011-05-02 MED ORDER — ASPIRIN 300 MG RE SUPP: 300.0000 mg | RECTAL | Status: AC

## 2011-05-02 MED ORDER — CARVEDILOL 3.125 MG PO TABS
3.1250 mg | ORAL_TABLET | Freq: Two times a day (BID) | ORAL | Status: DC
  Administered 2011-05-02 – 2011-05-03 (×3): 3.125 mg via ORAL
  Filled 2011-05-02 (×6): qty 1

## 2011-05-02 MED ORDER — ACETAMINOPHEN 325 MG PO TABS: 650.0000 mg | ORAL_TABLET | ORAL | Status: DC | PRN

## 2011-05-02 MED ORDER — SODIUM CHLORIDE 0.9 % IJ SOLN: 3.0000 mL | INTRAMUSCULAR | Status: DC | PRN

## 2011-05-02 MED ORDER — ALBUTEROL SULFATE HFA 108 (90 BASE) MCG/ACT IN AERS
2.0000 | INHALATION_SPRAY | Freq: Four times a day (QID) | RESPIRATORY_TRACT | Status: DC | PRN
  Filled 2011-05-02: qty 6.7

## 2011-05-02 MED ORDER — HEPARIN BOLUS VIA INFUSION
5000.0000 [IU] | Freq: Once | INTRAVENOUS | Status: AC
  Administered 2011-05-02: 5000 [IU] via INTRAVENOUS

## 2011-05-02 MED ORDER — DILTIAZEM HCL ER BEADS 300 MG PO CP24
420.0000 mg | ORAL_CAPSULE | Freq: Every day | ORAL | Status: DC
  Administered 2011-05-02 – 2011-05-03 (×2): 420 mg via ORAL
  Filled 2011-05-02 (×3): qty 1

## 2011-05-02 MED ORDER — ROSUVASTATIN CALCIUM 40 MG PO TABS
40.0000 mg | ORAL_TABLET | Freq: Every day | ORAL | Status: DC
  Administered 2011-05-03: 40 mg via ORAL
  Filled 2011-05-02 (×3): qty 1

## 2011-05-02 NOTE — H&P (Signed)
Patient ID: Raymond Lutz MRN: [id number], DOB/AGE: Apr 17, 1965   Admit date: 05/02/2011   Primary Physician: Evette Georges, MD, MD Primary Cardiologist: B. Crenshaw (2003)  Pt. Profile:  46 y/o male with prior h/o chest pain and low-risk CL in 2003, who presents w/ a 1 wk h/o [place].  Problem List  Past Medical History  Diagnosis Date  . DIABETES MELLITUS, TYPE II 10/17/2006  . HYPERLIPIDEMIA 10/17/2006  . OBESITY 10/17/2006  . HYPERTENSION 10/17/2006  . PERIPHERAL VASCULAR DISEASE 11/26/2007  . Asthma   . ED (erectile dysfunction)   . Chest pain     a. 05/2001 - Cardiolite: EF 47%, Diaphragmatic Attenuation.  Marland Kitchen GERD (gastroesophageal reflux disease)   . Paroxysmal atrial fibrillation     2006 - was on coumadin - took himself off 6 mos after initiation.  . Sleep apnea     not using CPAP regularly    Past Surgical History  Procedure Date  . Hernia repair   . Hernia repair      Allergies  No Known Allergies  HPI  46 y/o male with the above problem list.  He has a h/o chest pain dating back to 2003 at which time he was admitted, ruled out, and had a low-risk cardiolite.  In 2006, pt was admitted with PAF.  He apparently broke spont and was placed on Coumadin, which he self d/c'd after about 6 mos.  He has not had cardiology follow-up.  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  No n, v, diaphoresis.  He sat and rested and Ss resolved after about 30 mins.  He had no recurrence of Ss until yesterday, both occurring at rest.  The first episode occurred while sitting @ work, lasted 15 mins and resolved spont.  The second episode occurred after dinner, while sitting, with radiation to his left shoulder, lasted 30-40 mins, and resolved spontaneously.  Because of Ss, he saw his PCP today who performed an ECG (NL), and advised ER eval.  Here, he is pain free and his first set of CE are wnl.  Of note, pt says that  he had tachypalpitations for the first time in years about a week ago while lying in bed.  He's not sure how long his palpitations lasted but says that they resolved spont.  Home Medications  Prior to Admission medications   Medication Sig Start Date End Date Taking? Authorizing Provider  albuterol (VENTOLIN HFA) 108 (90 BASE) MCG/ACT inhaler Inhale 2 puffs into the lungs every 6 (six) hours as needed.     Yes Historical Provider, MD  aspirin EC 325 MG tablet Take 325 mg by mouth daily.   Yes Historical Provider, MD  atorvastatin (LIPITOR) 20 MG tablet Take 20 mg by mouth at bedtime.   Yes Historical Provider, MD  diltiazem (TIAZAC) 420 MG 24 hr capsule Take 420 mg by mouth daily.   Yes Historical Provider, MD  furosemide (LASIX) 40 MG tablet Take 80 mg by mouth daily.   Yes Historical Provider, MD  metFORMIN (GLUCOPHAGE) 1000 MG tablet Take 1,000 mg by mouth 2 (two) times daily with a meal.   Yes Historical Provider, MD  metFORMIN (GLUCOPHAGE) 500 MG tablet Take 500 mg by mouth 2 (two) times daily with meals.     Yes Historical Provider, MD  potassium chloride SA (K-DUR,KLOR-CON) 20 MEQ tablet Take 40 mEq by mouth daily.   Yes Historical  Provider, MD  tadalafil (CIALIS) 20 MG tablet Take 20 mg by mouth daily as needed. For erectile dysfuntion   Yes Historical Provider, MD  glucose blood test strip 1 each by Other route. Use as instructed     Historical Provider, MD    Family History  Problem Relation Age of Onset  . Diabetes Maternal Grandmother   . Hypertension Brother   . Heart attack Sister     died mid 31's  . Heart attack Brother     died early 4's  . Hypertension Sister   . Cerebral aneurysm Mother     died mid 33's  . Lung cancer Father     died @ 46    History   Social History  . Marital Status: Married    Spouse Name: N/A    Number of Children: N/A  . Years of Education: N/A   Occupational History  . Not on file.   Social History Main Topics  . Smoking status:  Never Smoker   . Smokeless tobacco: Never Used  . Alcohol Use: No  . Drug Use: No  . Sexually Active: Yes   Other Topics Concern  . 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  Lives in [place] with wife - 2 children - 13, 10.     Review of Systems General:  No chills, fever, night sweats or weight changes.  Cardiovascular:  +++ chest pain & dyspnea as outlined above.  No edema, orthopnea, palpitations, paroxysmal nocturnal dyspnea. Dermatological: No rash, lesions/masses Respiratory: No cough, dyspnea Urologic: No hematuria, dysuria Abdominal:   No nausea, vomiting, diarrhea, bright red blood per rectum, melena, or hematemesis Neurologic:  No visual changes, wkns, changes in mental status. All other systems reviewed and are otherwise negative except as noted above.  Physical Exam  Blood pressure 149/83, pulse 76, temperature 98.3 F (36.8 C), temperature source Oral, resp. rate 12, height 6\' 2"  (1.88 m), weight 390 lb (176.903 kg), SpO2 98.00%.  General: Pleasant, NAD.  Obese. Psych: Normal affect. Neuro: Alert and oriented X 3. Moves all extremities spontaneously. HEENT: Normal  Neck: Supple, obese, no bruits.  Difficult to eval JVP 2/2 girth. Lungs:  Resp regular and unlabored, CTA. Heart: RRR no s3, s4, or murmurs. Abdomen: Soft, obese, non-tender, non-distended, BS + x 4.  Extremities: No clubbing, cyanosis or edema. DP/PT/Radials 2+ and equal bilaterally.  Labs   Basename 05/02/11 1346  CKTOTAL 278*  CKMB 2.9  TROPONINI <0.30   Lab Results  Component Value Date   WBC 6.2 05/02/2011   HGB 14.3 05/02/2011   HCT 41.8 05/02/2011   MCV 81.3 05/02/2011   PLT 317 05/02/2011     Lab 05/02/11 1346  NA 140  K 3.7  CL 103  CO2 27  BUN 13  CREATININE 0.72  CALCIUM 10.1  PROT --  BILITOT --  ALKPHOS --  ALT --  AST --  GLUCOSE 119*     Radiology/Studies  Dg Chest 2 View  05/02/2011  *RADIOLOGY REPORT*  Clinical Data: 1-week history  of intermittent stabbing chest pain. History of hypertension and diabetes.  CHEST - 2 VIEW 05/02/2011:  Comparison: Two-view chest x-ray 07/25/2007 [facility], 12/23/2003 [facility].  Findings: Cardiomediastinal silhouette unremarkable.  Suboptimal inspiration due to body habitus which accounts for crowded bronchovascular markings at the bases and accentuates the cardiac silhouette.  Taking this into account, lungs clear. Bronchovascular markings normal.  No pleural  effusions. Degenerative changes involving the mid thoracic spine.  No significant interval change.  IMPRESSION: Suboptimal inspiration.  No acute cardiopulmonary disease.  Original Report Authenticated By: Arnell Sieving, M.D.    ECG  RSR, no acute ST/T changes  ASSESSMENT AND PLAN  1.  [place]:  46 y/o male with multiple RF for CAD including FH, HTN, HL, DM, Obesity.  He's had exertional chest pressure as well as Ss at rest assoc w/ radiation to his left arm.  He is currently pain free.  ECG is non-acute and CE negative so far.  Plan to admit, cycle CE.  As he's had Ss @ rest, I'll add heparin and NTP.  Cont ASA & statin.  BP is up - add coreg.  Discussed cath to r/o obstructive CAD and pt is agreeable.  Radial approach would be best given his size.  2.  HTN:  BP up in ED.  Cont home meds.  Add carvedilol as above.    3.  HL:  Check lipids/lft's.  Cont statin.  4.  DM:  Hold metformin.  Add SSI.  5.  H/O PAF:  Had palpitations a week ago.  Cont home dose of dilt.  Follow rhythm on monitor.  His CHADS2 = 2.  Consider outpt event monitor to assess for afib burden - if any.  6.  OSA:  Has CPAP @ home but doesn't routinely use.  Will order.   Signed, Nicolasa Ducking, NP 05/02/2011, 3:52 PM  Cardiology Attending  Patient seen and examined with Mr. Brion Aliment. I have reviewed his documentation of the history, PE, assessment and plan. He has multiple cardiac risk factors and a worrisome family history. I have  recommended left heart cath. Continue meds as above.   Lewayne Bunting, M.D.

## 2011-05-02 NOTE — ED Notes (Signed)
Pt presents with 1 week h/o L sided chest pain.  Pt reports pain has been intermittent and radiates into L arm.  -shortness of breath.  Pt seen at Dr. Nelida Meuse office this morning and referred here for admission by [place].  Pt denies any intake of cialis recently.

## 2011-05-02 NOTE — ED Notes (Signed)
2007-01 Ready 

## 2011-05-02 NOTE — Progress Notes (Signed)
  Subjective:    Patient ID: Raymond Lutz, male    DOB: 01/24/1966, 46 y.o.   MRN: [id number]  HPI Sparrow is a 46-year-old married male nonsmoker who comes in today for evaluation of chest pain  He states last Tuesday he was at work loading a truck and he had left upper anterior chest pain. He describes it as a pressure type sensation. No shortness of breath nausea etc. The discomfort lasted about 30 minutes and went away. He describes it as a 5 on a scale of 1-10.  He then had another episode yesterday at work while he was sitting. Same type of discomfort same area left upper chest pressure type sensation lasts about 30 minutes somewhat wide.  He also awoke last Friday night in the middle the night with symptoms of reflux esophagitis which he describes as a burning sensation in the middle of his chest. He had to sit up he took some Pepto-Bismol it helped but he was unable to sleep at night. He's had a history of reflux esophagitis in the past and has been on Nexium. He states that these 2 discomforts are different  He is obese with a weight of 390 pounds, long-term diabetic, hypertensive,    Review of Systems General and cardiac and pulmonary and GI review of systems otherwise negative    Objective:   Physical Exam  Obese male in no acute distress cardiopulmonary exam normal EKG normal BP today 160/120      Assessment & Plan:  Left upper anterior chest pain episodes x2 worrisome for coronary disease because of underlying risk factors. Discussed with Dr. Katz. He recommended admission for cardiac event  Reflux esophagitis treat symptomatically 

## 2011-05-02 NOTE — Patient Instructions (Signed)
Go directly to tone the emergency room and ask for the labauer cardiologist

## 2011-05-02 NOTE — ED Provider Notes (Signed)
History     CSN: [id number]  Arrival date & time 05/02/11  1204   First MD Initiated Contact with Patient 05/02/11 1337      Chief Complaint  Patient presents with  . Chest Pain    (Consider location/radiation/quality/duration/timing/severity/associated sxs/prior treatment) The history is provided by the patient.   Patient with history of diabetes, hyperlipidemia, hypertension, obesity, peripheral vascular disease presents with one week of intermittent left-sided chest pain. This is episodic in nature, with episodes lasting approximately 30 minutes. The pain is stabbing in nature. Is not associated with shortness of breath or diaphoresis. He has not had any nausea or vomiting. The pain does not seem to be positional or pleuritic and does not get worse with movement. The initial episode was associated with activity; however, he's also had episodes at rest. He has also noted that his blood pressure has been elevated more than usual over the past several days; his BP normally is about 135 systolic at home, but he has had readings of up to 180. He has had paroxysmal atrial fibrillation with rapid ventricular rate in the past, but denies palpitations at this time.  He went to his primary care doctor, Dr. Nelida Meuse, office this morning; an EKG performed there was unremarkable. Dr. Tawanna Cooler discussed with Dr. Myrtis Ser of [facility] cardiology; it was recommended that he come to the ED for further evaluation.  Past Medical History  Diagnosis Date  . DIABETES MELLITUS, TYPE II 10/17/2006  . HYPERLIPIDEMIA 10/17/2006  . OBESITY 10/17/2006  . HYPERTENSION 10/17/2006  . PERIPHERAL VASCULAR DISEASE 11/26/2007  . Asthma   . ED (erectile dysfunction)     Past Surgical History  Procedure Date  . Hernia repair   . Hernia repair     Family History  Problem Relation Age of Onset  . Diabetes Other   . Hypertension Other     History  Substance Use Topics  . Smoking status: Never Smoker   . Smokeless tobacco: Never  Used  . Alcohol Use: No      Review of Systems  Constitutional: Negative for fever, chills, activity change and appetite change.  HENT: Negative for congestion, sore throat, rhinorrhea and neck pain.   Respiratory: Negative for chest tightness and shortness of breath.   Cardiovascular: Positive for chest pain.  Gastrointestinal: Negative for nausea, vomiting and abdominal pain.  Musculoskeletal: Negative for myalgias.  Skin: Negative for rash.  Neurological: Negative for dizziness and weakness.    Allergies  Review of patient's allergies indicates no known allergies.  Home Medications   Current Outpatient Rx  Name Route Sig Dispense Refill  . ALBUTEROL SULFATE HFA 108 (90 BASE) MCG/ACT IN AERS Inhalation Inhale 2 puffs into the lungs every 6 (six) hours as needed.      . ASPIRIN EC 325 MG PO TBEC Oral Take 325 mg by mouth daily.    . ATORVASTATIN CALCIUM 20 MG PO TABS Oral Take 20 mg by mouth at bedtime.    Marland Kitchen DILTIAZEM HCL ER BEADS 420 MG PO CP24 Oral Take 420 mg by mouth daily.    . FUROSEMIDE 40 MG PO TABS Oral Take 80 mg by mouth daily.    Marland Kitchen METFORMIN HCL 1000 MG PO TABS Oral Take 1,000 mg by mouth 2 (two) times daily with a meal.    . METFORMIN HCL 500 MG PO TABS Oral Take 500 mg by mouth 2 (two) times daily with meals.      Marland Kitchen POTASSIUM CHLORIDE CRYS ER 20 MEQ  PO TBCR Oral Take 40 mEq by mouth daily.    Marland Kitchen TADALAFIL 20 MG PO TABS Oral Take 20 mg by mouth daily as needed. For erectile dysfuntion    . GLUCOSE BLOOD VI STRP Other 1 each by Other route. Use as instructed       BP 208/99  Pulse 86  Temp(Src) 98.3 F (36.8 C) (Oral)  Resp 20  Ht 6\' 2"  (1.88 m)  Wt 390 lb (176.903 kg)  BMI 50.07 kg/m2  SpO2 98%  Physical Exam  Nursing note and vitals reviewed. Constitutional: He is oriented to person, place, and time. He appears well-developed and well-nourished. No distress.  HENT:  Head: Normocephalic and atraumatic.  Eyes: Pupils are equal, round, and reactive to  light.  Neck: Normal range of motion.  Cardiovascular: Normal rate, regular rhythm and normal heart sounds.  Exam reveals no gallop and no friction rub.   No murmur heard. Pulmonary/Chest: Effort normal and breath sounds normal. He exhibits no tenderness.  Abdominal: Soft. Bowel sounds are normal. There is no tenderness. There is no rebound and no guarding.  Neurological: He is alert and oriented to person, place, and time.  Skin: Skin is warm and dry. No rash noted. He is not diaphoretic.  Psychiatric: He has a normal mood and affect.    ED Course  Procedures (including critical care time)   Date: 05/02/2011  Rate: 87  Rhythm: normal sinus rhythm  QRS Axis: normal  Intervals: normal  ST/T Wave abnormalities: normal  Conduction Disutrbances:incomplete RBBB  Narrative Interpretation:   Old EKG Reviewed: Aug 2006 ECG shows 1st deg AV block, incomplete RBBB    Labs Reviewed  CARDIAC PANEL(CRET KIN+CKTOT+MB+TROPI) - Abnormal; Notable for the following:    Total CK 278 (*)    All other components within normal limits  BASIC METABOLIC PANEL - Abnormal; Notable for the following:    Glucose, Bld 119 (*)    All other components within normal limits  CBC   Dg Chest 2 View  05/02/2011  *RADIOLOGY REPORT*  Clinical Data: 1-week history of intermittent stabbing chest pain. History of hypertension and diabetes.  CHEST - 2 VIEW 05/02/2011:  Comparison: Two-view chest x-ray 07/25/2007 [facility], 12/23/2003 [facility].  Findings: Cardiomediastinal silhouette unremarkable.  Suboptimal inspiration due to body habitus which accounts for crowded bronchovascular markings at the bases and accentuates the cardiac silhouette.  Taking this into account, lungs clear. Bronchovascular markings normal.  No pleural effusions. Degenerative changes involving the mid thoracic spine.  No significant interval change.  IMPRESSION: Suboptimal inspiration.  No acute cardiopulmonary disease.   Original Report Authenticated By: Arnell Sieving, M.D.     1. Unstable angina       MDM  Patient's initial cardiac markers are within normal limits. CXR and ECG with no gross changes from previous. Cardiology has consulted and will admit for possible unstable angina, given his numerous risk factors.        Grant Fontana, [place] 05/02/11 1657  Grant Fontana, [place] 05/02/11 [phone]

## 2011-05-02 NOTE — Progress Notes (Signed)
ANTICOAGULATION CONSULT NOTE - Initial Consult  Pharmacy Consult for UFH Indication: USAP  No Known Allergies  Patient Measurements: Height: 6\' 2"  (188 cm) Weight: 390 lb (176.903 kg) IBW/kg (Calculated) : 82.2   Vital Signs: Temp: 98.3 F (36.8 C) (02/19 1206) Temp src: Oral (02/19 1206) BP: 160/103 mmHg (02/19 1653) Pulse Rate: 76  (02/19 1653)  Labs:  Basename 05/02/11 1346  HGB 14.3  HCT 41.8  PLT 317  APTT --  LABPROT --  INR --  HEPARINUNFRC --  CREATININE 0.72  CKTOTAL 278*  CKMB 2.9  TROPONINI <0.30   Estimated Creatinine Clearance: 200.2 ml/min (by C-G formula based on Cr of 0.72).  Medical History: Past Medical History  Diagnosis Date  . DIABETES MELLITUS, TYPE II 10/17/2006  . HYPERLIPIDEMIA 10/17/2006  . OBESITY 10/17/2006  . HYPERTENSION 10/17/2006  . PERIPHERAL VASCULAR DISEASE 11/26/2007  . Asthma   . ED (erectile dysfunction)   . Chest pain     a. 05/2001 - Cardiolite: EF 47%, Diaphragmatic Attenuation.  Marland Kitchen GERD (gastroesophageal reflux disease)   . Paroxysmal atrial fibrillation     2006 - was on coumadin - took himself off 6 mos after initiation.  . Sleep apnea     not using CPAP regularly    Medications:   (Not in a hospital admission)  Assessment: 46 y/o male patient admitted with chest pain requiring anticoagulation for r/o MI. CE neg x1, no EKG changes.  Goal of Therapy:  Heparin level 0.3-0.7 units/ml   Plan:  Heparin 5000 unit IV bolus followed by infusion at 1700 units/hr. Check 6 hour heparin level with daily cbc and heparin level.  Verlene Mayer, PharmD, BCPS Pager [phone] 05/02/2011,5:11 PM

## 2011-05-03 ENCOUNTER — Other Ambulatory Visit: Payer: Self-pay

## 2011-05-03 ENCOUNTER — Encounter (HOSPITAL_COMMUNITY)
Admission: EM | Disposition: A | Discharge status: Home / Self Care | Payer: Self-pay | Source: Ambulatory Visit | Attending: Internal Medicine

## 2011-05-03 DIAGNOSIS — I10 Essential (primary) hypertension: Secondary | ICD-10-CM

## 2011-05-03 DIAGNOSIS — I200 Unstable angina: Secondary | ICD-10-CM

## 2011-05-03 DIAGNOSIS — I2510 Atherosclerotic heart disease of native coronary artery without angina pectoris: Secondary | ICD-10-CM

## 2011-05-03 HISTORY — PX: LEFT HEART CATHETERIZATION WITH CORONARY ANGIOGRAM: SHX5451

## 2011-05-03 LAB — LIPID PANEL
Cholesterol: 193 mg/dL (ref 0–200)
HDL: 35 mg/dL — ABNORMAL LOW (ref >39)
LDL Cholesterol: 115 mg/dL — ABNORMAL HIGH (ref 0–99)
Total CHOL/HDL Ratio: 5.5 RATIO
Triglycerides: 215 mg/dL — ABNORMAL HIGH (ref <150)
VLDL: 43 mg/dL — ABNORMAL HIGH (ref 0–40)

## 2011-05-03 LAB — CBC
HCT: 39.4 % (ref 39.0–52.0)
Hemoglobin: 13 g/dL (ref 13.0–17.0)
MCH: 26.9 pg (ref 26.0–34.0)
MCHC: 33 g/dL (ref 30.0–36.0)
MCV: 81.6 fL (ref 78.0–100.0)
Platelets: 297 10*3/uL (ref 150–400)
RBC: 4.83 MIL/uL (ref 4.22–5.81)
RDW: 14.7 % (ref 11.5–15.5)
WBC: 6 10*3/uL (ref 4.0–10.5)

## 2011-05-03 LAB — BASIC METABOLIC PANEL
BUN: 14 mg/dL (ref 6–23)
CO2: 25 mEq/L (ref 19–32)
Calcium: 9.1 mg/dL (ref 8.4–10.5)
Chloride: 103 mEq/L (ref 96–112)
Creatinine, Ser: 0.76 mg/dL (ref 0.50–1.35)
GFR calc Af Amer: >90 mL/min (ref >90)
GFR calc non Af Amer: >90 mL/min (ref >90)
Glucose, Bld: 152 mg/dL — ABNORMAL HIGH (ref 70–99)
Potassium: 3.4 mEq/L — ABNORMAL LOW (ref 3.5–5.1)
Sodium: 139 mEq/L (ref 135–145)

## 2011-05-03 LAB — GLUCOSE, CAPILLARY
Glucose-Capillary: 159 mg/dL — ABNORMAL HIGH (ref 70–99)
Glucose-Capillary: 133 mg/dL — ABNORMAL HIGH (ref 70–99)
Glucose-Capillary: 107 mg/dL — ABNORMAL HIGH (ref 70–99)
Glucose-Capillary: 175 mg/dL — ABNORMAL HIGH (ref 70–99)
Glucose-Capillary: 93 mg/dL (ref 70–99)

## 2011-05-03 LAB — TSH: TSH: 1.796 u[IU]/mL (ref 0.350–4.500)

## 2011-05-03 LAB — POCT ACTIVATED CLOTTING TIME
Activated Clotting Time: 177 seconds
Activated Clotting Time: 210 seconds
Activated Clotting Time: 215 seconds

## 2011-05-03 LAB — CARDIAC PANEL(CRET KIN+CKTOT+MB+TROPI)
CK, MB: 2.5 ng/mL (ref 0.3–4.0)
CK, MB: 2.4 ng/mL (ref 0.3–4.0)
Relative Index: 1.2 (ref 0.0–2.5)
Relative Index: 1.2 (ref 0.0–2.5)
Total CK: 211 U/L (ref 7–232)
Total CK: 203 U/L (ref 7–232)
Troponin I: <0.3 ng/mL (ref <0.30)
Troponin I: <0.3 ng/mL (ref <0.30)

## 2011-05-03 LAB — HEPARIN LEVEL (UNFRACTIONATED)
Heparin Unfractionated: 0.16 IU/mL — ABNORMAL LOW (ref 0.30–0.70)
Heparin Unfractionated: 0.46 IU/mL (ref 0.30–0.70)

## 2011-05-03 SURGERY — LEFT HEART CATHETERIZATION WITH CORONARY ANGIOGRAM
Anesthesia: LOCAL

## 2011-05-03 MED ORDER — HEPARIN BOLUS VIA INFUSION
3500.0000 [IU] | Freq: Once | INTRAVENOUS | Status: AC
  Administered 2011-05-03: 3500 [IU] via INTRAVENOUS
  Filled 2011-05-03: qty 3500

## 2011-05-03 MED ORDER — FENTANYL CITRATE 0.05 MG/ML IJ SOLN
INTRAMUSCULAR | Status: AC
  Filled 2011-05-03: qty 2

## 2011-05-03 MED ORDER — VERAPAMIL HCL 2.5 MG/ML IV SOLN
INTRAVENOUS | Status: AC
  Filled 2011-05-03: qty 2

## 2011-05-03 MED ORDER — POTASSIUM CHLORIDE CRYS ER 20 MEQ PO TBCR
40.0000 meq | EXTENDED_RELEASE_TABLET | Freq: Once | ORAL | Status: AC
  Administered 2011-05-03: 40 meq via ORAL
  Filled 2011-05-03: qty 2

## 2011-05-03 MED ORDER — MIDAZOLAM HCL 2 MG/2ML IJ SOLN
INTRAMUSCULAR | Status: AC
  Filled 2011-05-03: qty 2

## 2011-05-03 MED ORDER — SODIUM CHLORIDE 0.45 % IV SOLN
INTRAVENOUS | Status: AC
  Administered 2011-05-03: 16:00:00 via INTRAVENOUS

## 2011-05-03 MED ORDER — ADENOSINE (DIAGNOSTIC) 3 MG/ML IV SOLN
20.0000 mL | INTRAVENOUS | Status: DC
  Filled 2011-05-03: qty 20

## 2011-05-03 MED ORDER — HEPARIN SODIUM (PORCINE) 1000 UNIT/ML IJ SOLN
INTRAMUSCULAR | Status: AC
  Filled 2011-05-03: qty 1

## 2011-05-03 MED ORDER — LISINOPRIL 10 MG PO TABS
10.0000 mg | ORAL_TABLET | Freq: Every day | ORAL | Status: DC
  Administered 2011-05-03 – 2011-05-04 (×2): 10 mg via ORAL
  Filled 2011-05-03 (×2): qty 1

## 2011-05-03 MED ORDER — LIDOCAINE HCL (PF) 1 % IJ SOLN
INTRAMUSCULAR | Status: AC
  Filled 2011-05-03: qty 30

## 2011-05-03 MED ORDER — HEPARIN (PORCINE) IN NACL 2-0.9 UNIT/ML-% IJ SOLN
INTRAMUSCULAR | Status: AC
  Filled 2011-05-03: qty 2000

## 2011-05-03 MED ORDER — NITROGLYCERIN 0.2 MG/ML ON CALL CATH LAB
INTRAVENOUS | Status: AC
  Filled 2011-05-03: qty 1

## 2011-05-03 NOTE — Progress Notes (Signed)
ANTICOAGULATION CONSULT NOTE - Follow Up  Pharmacy Consult for Heparin Indication: USAP  No Known Allergies  Patient Measurements: Height: 6\' 2"  (188 cm) Weight: 408 lb 11.7 oz (185.4 kg) IBW/kg (Calculated) : 82.2  Heparin dosing weight: 128 kg   Vital Signs: Temp: 98.5 F (36.9 C) (02/20 0454) Temp src: Oral (02/19 2206) BP: 160/85 mmHg (02/20 0454) Pulse Rate: 76  (02/20 0454)  Labs:  Basename 05/03/11 0626 05/03/11 0117 05/02/11 2340 05/02/11 1953 05/02/11 1346  HGB 13.0 -- -- -- 14.3  HCT 39.4 -- -- -- 41.8  PLT 297 -- -- -- 317  APTT -- -- -- -- --  LABPROT -- -- -- 13.4 --  INR -- -- -- 1.00 --  HEPARINUNFRC 0.46 -- 0.16* -- --  CREATININE 0.76 -- -- -- 0.72  CKTOTAL 203 211 -- 227 --  CKMB 2.4 2.5 -- 2.6 --  TROPONINI <0.30 <0.30 -- <0.30 --   Estimated Creatinine Clearance: 205.8 ml/min (by C-G formula based on Cr of 0.76).  Medical History: Past Medical History  Diagnosis Date  . DIABETES MELLITUS, TYPE II 10/17/2006  . HYPERLIPIDEMIA 10/17/2006  . OBESITY 10/17/2006  . HYPERTENSION 10/17/2006  . PERIPHERAL VASCULAR DISEASE 11/26/2007  . Asthma   . Chest pain     a. 05/2001 - Cardiolite: EF 47%, Diaphragmatic Attenuation.  Marland Kitchen GERD (gastroesophageal reflux disease)   . Paroxysmal atrial fibrillation     2006 - was on coumadin - took himself off 6 mos after initiation.  . Angina   . Shortness of breath   . Sleep apnea     not using CPAP regularly  . ED (erectile dysfunction)   . Arthritis     Medications:  Prescriptions prior to admission  Medication Sig Dispense Refill  . albuterol (VENTOLIN HFA) 108 (90 BASE) MCG/ACT inhaler Inhale 2 puffs into the lungs every 6 (six) hours as needed.        Marland Kitchen aspirin EC 325 MG tablet Take 325 mg by mouth daily.      Marland Kitchen atorvastatin (LIPITOR) 20 MG tablet Take 20 mg by mouth at bedtime.      Marland Kitchen diltiazem (TIAZAC) 420 MG 24 hr capsule Take 420 mg by mouth daily.      . furosemide (LASIX) 40 MG tablet Take 80 mg by mouth  daily.      . metFORMIN (GLUCOPHAGE) 1000 MG tablet Take 1,000 mg by mouth 2 (two) times daily with a meal.      . metFORMIN (GLUCOPHAGE) 500 MG tablet Take 500 mg by mouth 2 (two) times daily with meals.        . potassium chloride SA (K-DUR,KLOR-CON) 20 MEQ tablet Take 40 mEq by mouth daily.      . tadalafil (CIALIS) 20 MG tablet Take 20 mg by mouth daily as needed. For erectile dysfuntion      . glucose blood test strip 1 each by Other route. Use as instructed         Assessment: 46 yo male patient admitted with r/o ACS. Enzymes negative. Heparin level 0.46 after 3500 unit bolus and  rate increased to 2200 units/hr. No bleeding reported. For cath today Goal of Therapy:  Heparin level 0.3-0.7 units/ml   Plan:  1. Continue heparin at  2200 units/hr.  2. F/u after cath for plan Len Childs, PharmD 05/03/2011,9:46 AM

## 2011-05-03 NOTE — ED Provider Notes (Signed)
Medical screening examination/treatment/procedure(s) were conducted as a shared visit with non-physician practitioner(s) and myself.  I personally evaluated the patient during the encounter  On my exam patient's pain is controlled and not c/o sob. RRR. Found to have pulmonary embolism with likely infarction (v/s infection). Admitted to hospitalist.  Forbes Cellar, MD 05/03/11 1153

## 2011-05-03 NOTE — Op Note (Signed)
Cardiac Catheterization Procedure Note  Name: Raymond Lutz MRN: [id number] DOB: January 18, 1966  Procedure: Left Heart Cath, Selective Coronary Angiography, LV angiography, pressure wire interrogation of the left circumflex.  Indication: Unstable angina.  Medications:  Sedation:  3 mg IV Versed, 75 mcg IV Fentanyl  Contrast:  185 mL Omnipaque  Procedural Details: The right wrist was prepped, draped, and anesthetized with 1% lidocaine. Using the modified Seldinger technique, a 5 French sheath was introduced into the right radial artery. 3 mg of verapamil was administered through the sheath, weight-based unfractionated heparin was administered intravenously. A standard Judkins catheters were used for selective coronary angiography. A pigtail catheter was used for left ventriculography. Catheter exchanges were performed over an exchange length guidewire. There were no immediate procedural complications.  Procedural Findings:  Hemodynamics: AO:  141/86   mmHg LV:  141/8    mmHg LVEDP: 18  mmHg  Coronary angiography: Coronary dominance: Right   Left Main:  Normal in size and free of significant disease.  Left Anterior Descending (LAD):  Normal in size with minor luminal irregularities. There is a mild 20-30% lesion in the distal LAD close to the apex.  1st diagonal (D1):  Normal in size with minor irregularities.  2nd diagonal (D2):  Small in size.  3rd diagonal (D3):  Normal size and free of significant disease.  Circumflex (LCx):  Normal in size and nondominant. The mid segment is ectatic followed by a 50% stenosis after OM2. There is another 50% lesion just before on 3.  1st obtuse marginal:  Small in size and free of significant disease.  2nd obtuse marginal:  Medium in size and free of significant disease.  3rd obtuse marginal:  Normal in size and free of significant disease.   Right Coronary Artery: Normal in size and dominant. There is mild atherosclerosis in the mid  segment. Distally there is a 30-40% lesion.  posterior descending artery: Normal in size with minor irregularities.  posterior lateral branch:  There are 2 medium-sized posterolateral branches which are free of significant disease.   Left ventriculography: Left ventricular systolic function is normal , LVEF is estimated at 55 %.   PCI Note:  Following the diagnostic procedure, the decision was made to proceed with pressure wire interrogation of the left circumflex.. Additional heparin was given for anticoagulation. ACT was 210 and thus an additional 2000 units of heparin was given. Once a therapeutic ACT was achieved, a 5 [place] EBU 3.5 guide catheter was inserted. There was difficulty engaging the left main. I tried using an EBU 4.0 but did not fit very well. I switched back to an EBU 3.5.  A volcano pressure  guidewire was used in the standard fashion after zeroing and normalizing.  Intravenous adenosine was used at the dose of 135 mcg per kilogram per minute. Due to his weight we could not use the 145 mcg per kilogram per minute. There was no significant resting gradient. FFR ratio was 0.93 after more than 2 minutes of intravenous adenosine which caused significant AV block.    Final Conclusions:  1. moderate left circumflex stenosis estimated to be 50% at 2 separate mid locations. FFR ratio is 0.93 which indicates nonobstructive physiology. The patient otherwise has mild to moderate coronary artery disease. 2. Normal LV systolic function. 3. Mildly elevated left ventricular end-diastolic pressure likely due to diastolic dysfunction.  Recommendations:  I recommend aggressive medical therapy of his risk factors. His treatment of sleep apnea needs to be optimized. He likely can be  discharged home tomorrow if he remains stable.  Lorine Bears MD, [facility] 05/03/2011, 3:49 PM

## 2011-05-03 NOTE — Progress Notes (Signed)
UR Completed. Simmons, Jordain Radin F [phone]  

## 2011-05-03 NOTE — Progress Notes (Signed)
ANTICOAGULATION CONSULT NOTE - Follow Up  Pharmacy Consult for Heparin Indication: USAP  No Known Allergies  Patient Measurements: Height: 6\' 2"  (188 cm) Weight: 408 lb 11.7 oz (185.4 kg) IBW/kg (Calculated) : 82.2  Heparin dosing weight: 128 kg   Vital Signs: Temp: 97.6 F (36.4 C) (02/19 2206) Temp src: Oral (02/19 2206) BP: 151/90 mmHg (02/19 2206) Pulse Rate: 70  (02/19 2206)  Labs:  Basename 05/02/11 2340 05/02/11 1953 05/02/11 1346  HGB -- -- 14.3  HCT -- -- 41.8  PLT -- -- 317  APTT -- -- --  LABPROT -- 13.4 --  INR -- 1.00 --  HEPARINUNFRC 0.16* -- --  CREATININE -- -- 0.72  CKTOTAL -- 227 278*  CKMB -- 2.6 2.9  TROPONINI -- <0.30 <0.30   Estimated Creatinine Clearance: 205.8 ml/min (by C-G formula based on Cr of 0.72).  Medical History: Past Medical History  Diagnosis Date  . DIABETES MELLITUS, TYPE II 10/17/2006  . HYPERLIPIDEMIA 10/17/2006  . OBESITY 10/17/2006  . HYPERTENSION 10/17/2006  . PERIPHERAL VASCULAR DISEASE 11/26/2007  . Asthma   . Chest pain     a. 05/2001 - Cardiolite: EF 47%, Diaphragmatic Attenuation.  Marland Kitchen GERD (gastroesophageal reflux disease)   . Paroxysmal atrial fibrillation     2006 - was on coumadin - took himself off 6 mos after initiation.  . Angina   . Shortness of breath   . Sleep apnea     not using CPAP regularly  . ED (erectile dysfunction)   . Arthritis     Medications:  Prescriptions prior to admission  Medication Sig Dispense Refill  . albuterol (VENTOLIN HFA) 108 (90 BASE) MCG/ACT inhaler Inhale 2 puffs into the lungs every 6 (six) hours as needed.        Marland Kitchen aspirin EC 325 MG tablet Take 325 mg by mouth daily.      Marland Kitchen atorvastatin (LIPITOR) 20 MG tablet Take 20 mg by mouth at bedtime.      Marland Kitchen diltiazem (TIAZAC) 420 MG 24 hr capsule Take 420 mg by mouth daily.      . furosemide (LASIX) 40 MG tablet Take 80 mg by mouth daily.      . metFORMIN (GLUCOPHAGE) 1000 MG tablet Take 1,000 mg by mouth 2 (two) times daily with a meal.       . metFORMIN (GLUCOPHAGE) 500 MG tablet Take 500 mg by mouth 2 (two) times daily with meals.        . potassium chloride SA (K-DUR,KLOR-CON) 20 MEQ tablet Take 40 mEq by mouth daily.      . tadalafil (CIALIS) 20 MG tablet Take 20 mg by mouth daily as needed. For erectile dysfuntion      . glucose blood test strip 1 each by Other route. Use as instructed         Assessment: 46 yo male patient admitted with r/o ACS. Heparin level (0.16) is below-goal on heparin 1700 units/hr. No problem with line per RN.   Goal of Therapy:  Heparin level 0.3-0.7 units/ml   Plan:  1. Heparin IV bolus of 3500 units x 1, then increase infusion to 2200 units/hr.  2. Heparin level in 6 hours.   Lorre Munroe, PharmD 05/03/2011,12:49 AM

## 2011-05-03 NOTE — Interval H&P Note (Signed)
History and Physical Interval Note:  05/03/2011 2:10 PM  Raymond Lutz  has presented today for surgery, with the diagnosis of cp  The various methods of treatment have been discussed with the patient. After consideration of risks, benefits and other options for treatment, the patient has consented to  Procedure(s) (LRB): LEFT HEART CATHETERIZATION WITH CORONARY ANGIOGRAM (N/A) as a surgical intervention .  The patients' history has been reviewed, patient examined, no change in status, stable for surgery.  I have reviewed the patients' chart and labs.  Questions were answered to the patient's satisfaction.     Lorine Bears

## 2011-05-03 NOTE — H&P (View-Only) (Signed)
  Subjective:    Patient ID: Raymond Lutz, male    DOB: 05/19/1965, 46 y.o.   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 He describes it as a pressure type sensation. No shortness of breath nausea etc. The discomfort lasted about 30 minutes and went away. He describes it as a 5 on a scale of 1-10.  He then had another episode yesterday at work while he was sitting. Same type of discomfort same area left upper chest pressure type sensation lasts about 30 minutes somewhat wide.  He also awoke last Friday night in the middle the night with symptoms of reflux esophagitis which he describes as a burning sensation in the middle of his chest. He had to sit up he took some Pepto-Bismol it helped but he was unable to sleep at night. He's had a history of reflux esophagitis in the past and has been on Nexium. He states that these 2 discomforts are different  He is obese with a weight of 390 pounds, long-term diabetic, hypertensive,    Review of Systems General and cardiac and pulmonary and GI review of systems otherwise negative    Objective:   Physical Exam  Obese male in no acute distress cardiopulmonary exam normal EKG normal BP today 160/120      Assessment & Plan:  Left upper anterior chest pain episodes x2 worrisome for coronary disease because of underlying risk factors. Discussed with Dr. Myrtis Ser. He recommended admission for cardiac event  Reflux esophagitis treat symptomatically

## 2011-05-03 NOTE — Progress Notes (Signed)
@   Subjective:  Denies CP or dyspnea   Objective:  Filed Vitals:   05/02/11 1653 05/02/11 1854 05/02/11 2206 05/03/11 0454  BP: 160/103 177/103 151/90 160/85  Pulse: 76 79 70 76  Temp:  98.1 F (36.7 C) 97.6 F (36.4 C) 98.5 F (36.9 C)  TempSrc:  Oral Oral   Resp: 20 22 20    Height:  6\' 2"  (1.88 m)    Weight:  408 lb 11.7 oz (185.4 kg)    SpO2: 97% 99% 92% 99%    Intake/Output from previous day:  Intake/Output Summary (Last 24 hours) at 05/03/11 9604 Last data filed at 05/02/11 1900  Gross per 24 hour  Intake    240 ml  Output      0 ml  Net    240 ml    Physical Exam: Physical exam: Well-developed obese in no acute distress.  Skin is warm and dry.  HEENT is normal.  Neck is supple. No thyromegaly.  Chest is clear to auscultation with normal expansion.  Cardiovascular exam is regular rate and rhythm. 2/6 systolic murmur Abdominal exam nontender or distended. No masses palpated. Extremities show no edema. neuro grossly intact    Lab Results: Basic Metabolic Panel:  Basename 05/03/11 0626 05/02/11 1953 05/02/11 1346  NA 139 -- 140  K 3.4* -- 3.7  CL 103 -- 103  CO2 25 -- 27  GLUCOSE 152* -- 119*  BUN 14 -- 13  CREATININE 0.76 -- 0.72  CALCIUM 9.1 -- 10.1  MG -- 1.9 --  PHOS -- -- --   CBC:  Basename 05/03/11 0626 05/02/11 1346  WBC 6.0 6.2  NEUTROABS -- --  HGB 13.0 14.3  HCT 39.4 41.8  MCV 81.6 81.3  PLT 297 317   Cardiac Enzymes:  Basename 05/03/11 0626 05/03/11 0117 05/02/11 1953  CKTOTAL 203 211 227  CKMB 2.4 2.5 2.6  CKMBINDEX -- -- --  TROPONINI <0.30 <0.30 <0.30     Assessment/Plan:  1) Chest pain - enzymes neg; for cath today (risks and benefits discussed and patient agrees to proceed). Continue ASA, coreg, heparin and statin. 2) BP elevated - add lisinopril 3) hyperlipidemia - continue statin. 4) DM - FU with primary care. 5) H/O afib - patient previously dced coumadin on his own. Presently sinus. Continue lopressor.  Outpatient cardionet. 6) murmur - Outpatient echo  Olga Millers 05/03/2011, 8:23 AM

## 2011-05-04 ENCOUNTER — Other Ambulatory Visit: Payer: Self-pay

## 2011-05-04 DIAGNOSIS — I4430 Unspecified atrioventricular block: Secondary | ICD-10-CM

## 2011-05-04 DIAGNOSIS — I2510 Atherosclerotic heart disease of native coronary artery without angina pectoris: Secondary | ICD-10-CM

## 2011-05-04 HISTORY — DX: Unspecified atrioventricular block: I44.30

## 2011-05-04 LAB — CBC
HCT: 39 % (ref 39.0–52.0)
Hemoglobin: 12.8 g/dL — ABNORMAL LOW (ref 13.0–17.0)
MCH: 26.8 pg (ref 26.0–34.0)
MCHC: 32.8 g/dL (ref 30.0–36.0)
MCV: 81.8 fL (ref 78.0–100.0)
Platelets: 334 10*3/uL (ref 150–400)
RBC: 4.77 MIL/uL (ref 4.22–5.81)
RDW: 14.8 % (ref 11.5–15.5)
WBC: 6.6 10*3/uL (ref 4.0–10.5)

## 2011-05-04 LAB — BASIC METABOLIC PANEL
BUN: 11 mg/dL (ref 6–23)
CO2: 25 mEq/L (ref 19–32)
Calcium: 9.5 mg/dL (ref 8.4–10.5)
Chloride: 105 mEq/L (ref 96–112)
Creatinine, Ser: 0.77 mg/dL (ref 0.50–1.35)
GFR calc Af Amer: >90 mL/min (ref >90)
GFR calc non Af Amer: >90 mL/min (ref >90)
Glucose, Bld: 118 mg/dL — ABNORMAL HIGH (ref 70–99)
Potassium: 4 mEq/L (ref 3.5–5.1)
Sodium: 139 mEq/L (ref 135–145)

## 2011-05-04 LAB — GLUCOSE, CAPILLARY
Glucose-Capillary: 130 mg/dL — ABNORMAL HIGH (ref 70–99)
Glucose-Capillary: 103 mg/dL — ABNORMAL HIGH (ref 70–99)

## 2011-05-04 MED ORDER — METFORMIN HCL 500 MG PO TABS: 500.0000 mg | ORAL_TABLET | Freq: Two times a day (BID) | ORAL | Status: DC

## 2011-05-04 MED ORDER — DILTIAZEM HCL ER BEADS 240 MG PO CP24: 240.0000 mg | ORAL_CAPSULE | Freq: Every day | ORAL | Status: DC

## 2011-05-04 MED ORDER — METFORMIN HCL 1000 MG PO TABS: 1000.0000 mg | ORAL_TABLET | Freq: Two times a day (BID) | ORAL | Status: DC

## 2011-05-04 MED ORDER — DILTIAZEM HCL ER COATED BEADS 240 MG PO CP24
240.0000 mg | ORAL_CAPSULE | Freq: Every day | ORAL | Status: DC
  Administered 2011-05-04: 240 mg via ORAL
  Filled 2011-05-04: qty 1

## 2011-05-04 MED ORDER — RAMIPRIL 5 MG PO CAPS: 5.0000 mg | ORAL_CAPSULE | Freq: Every day | ORAL | Status: DC

## 2011-05-04 NOTE — Progress Notes (Signed)
Patient ID: Raymond Lutz, male   DOB: [birth year], 46 y.o.   MRN: [id number] SUBJECTIVE:  Patient is feeling well today. He underwent catheterization through the right radial yesterday. He has mild disease but did not require PCI. He is stable go home. He was noted on his monitor this morning that he had sinus bradycardia. In addition he had some 2-1 AV block. Most of it looked like his betaWenkebach. There is no history of syncope or presyncope.He was on high-dose diltiazem when he came in the hospital. Low-dose beta blocker was also added. For this morning I put his diltiazem on hold and stop his carvedilol. He has diabetes. Renal function is good. It seems most appropriate to not use a beta blocker on ongoing basis. We'll decrease his diltiazem dose and start him on low dose of an ACE inhibitor and arrange for early followup with his primary physician.   Filed Vitals:   05/04/11 0453 05/04/11 0456 05/04/11 0600 05/04/11 0801  BP: 118/62  144/61 135/74  Pulse: 67  72 68  Temp: 98 F (36.7 C)   98.1 F (36.7 C)  TempSrc: Axillary   Oral  Resp: 13   14  Height:      Weight:  410 lb 11.5 oz (186.3 kg)    SpO2: 98%   100%    Intake/Output Summary (Last 24 hours) at 05/04/11 0900 Last data filed at 05/04/11 0457  Gross per 24 hour  Intake 2409.29 ml  Output   1125 ml  Net 1284.29 ml    LABS: Basic Metabolic Panel:  Basename 05/03/11 0626 05/02/11 1953 05/02/11 1346  NA 139 -- 140  K 3.4* -- 3.7  CL 103 -- 103  CO2 25 -- 27  GLUCOSE 152* -- 119*  BUN 14 -- 13  CREATININE 0.76 -- 0.72  CALCIUM 9.1 -- 10.1  MG -- 1.9 --  PHOS -- -- --   Liver Function Tests: No results found for this basename: AST:2,ALT:2,ALKPHOS:2,BILITOT:2,PROT:2,ALBUMIN:2 in the last 72 hours No results found for this basename: LIPASE:2,AMYLASE:2 in the last 72 hours CBC:  Basename 05/04/11 0435 05/03/11 0626  WBC 6.6 6.0  NEUTROABS -- --  HGB 12.8* 13.0  HCT 39.0 39.4  MCV 81.8 81.6  PLT 334 297    Cardiac Enzymes:  Basename 05/03/11 0626 05/03/11 0117 05/02/11 1953  CKTOTAL 203 211 227  CKMB 2.4 2.5 2.6  CKMBINDEX -- -- --  TROPONINI <0.30 <0.30 <0.30   BNP: No components found with this basename: POCBNP:3 D-Dimer: No results found for this basename: DDIMER:2 in the last 72 hours Hemoglobin A1C: No results found for this basename: HGBA1C in the last 72 hours Fasting Lipid Panel:  Basename 05/03/11 0626  CHOL 193  HDL 35*  LDLCALC 115*  TRIG 215*  CHOLHDL 5.5  LDLDIRECT --   Thyroid Function Tests:  Basename 05/02/11 1953  TSH 1.796  T4TOTAL --  T3FREE --  THYROIDAB --    RADIOLOGY: Dg Chest 2 View  05/02/2011  *RADIOLOGY REPORT*  Clinical Data: 1-week history of intermittent stabbing chest pain. History of hypertension and diabetes.  CHEST - 2 VIEW 05/02/2011:  Comparison: Two-view chest x-ray 07/25/2007 [facility] 12/23/2003 [facility].  Findings: Cardiomediastinal silhouette unremarkable.  Suboptimal inspiration due to body habitus which accounts for crowded bronchovascular markings at the bases and accentuates the cardiac silhouette.  Taking this into account, lungs clear. Bronchovascular markings normal.  No pleural effusions. Degenerative changes involving the mid thoracic spine.  No  significant interval change.  IMPRESSION: Suboptimal inspiration.  No acute cardiopulmonary disease.  Original Report Authenticated By: Raymond Lutz, M.D.    PHYSICAL EXAM  Patient is oriented to person time and place. Affect is normal. He is overweight. Head is atraumatic. There is no jugulovenous distention. Lungs are clear. Respiratory effort is nonlabored. Cardiac exam reveals S1 and S2. There no clicks or significant murmurs. There is no significant peripheral edema.  TELEMETRY: I have personally reviewed telemetry. Currently he has sinus rhythm. He did have some sinus bradycardia with Raymond Lutz in the middle the night.   ASSESSMENT AND PLAN:   DIABETES MELLITUS, TYPE II  HYPERLIPIDEMIA  HYPERTENSION  PERIPHERAL VASCULAR DISEASE  GERD (gastroesophageal reflux disease)   CAD (coronary artery disease)     See the cath report. The patient is to be treated with medical therapy.   AV block   Patient had some 2:1 AV block that was mostly Wenckebach during the night. We will not continue her beta blocker. We will decrease his diltiazem dose and start him on an ACE inhibitor. He needs early followup with his primary physician. He will have a followup appointment also with Raymond Lutz to be sure that there is no ongoing issue related to his AV block. I have not arrange for an event recorder at this time.   [name] 05/04/2011 9:00 AM

## 2011-05-04 NOTE — Discharge Instructions (Signed)
NO HEAVY LIFTING OR SEXUAL ACTIVITY X 7 DAYS. NO DRIVING X 2 DAYS. NO SOAKING BATHS, HOT TUBS, POOLS, ETC., X 5 DAYS.  

## 2011-05-04 NOTE — Progress Notes (Signed)
Patient heart rate brady down to 37.Patient asymptomatic blood pressure 144/61 . Denies chest pain or shortness of breath.Patient heart rhythm was sinus brady sinus rhythm  with first degree heart block .Rhythm now showing run of second degree heart block type 1.Call placed to PA Dunn.No new orders received at present time .Will continue to monitor patient and telemetry strips posted on chart. me

## 2011-05-04 NOTE — Discharge Summary (Signed)
CARDIOLOGY DISCHARGE SUMMARY   Patient ID: Raymond Lutz MRN: [id number] DOB/AGE: 45-Feb-1967 45 y.o.  Admit date: 05/02/2011 Discharge date: 05/04/2011  Primary Discharge Diagnosis:   . Unstable angina   Secondary Discharge Diagnosis:  Patient Active Problem List  Diagnoses  . DIABETES MELLITUS, TYPE II  . HYPERLIPIDEMIA  . OBESITY - morbid  . HYPERTENSION  . PERIPHERAL VASCULAR DISEASE  . ASTHMA, CHILDHOOD  . KNEE PAIN, LEFT, ACUTE  . CHEST WALL PAIN, ACUTE     . GERD (gastroesophageal reflux disease)  . CAD (coronary artery disease)  . AV block    Procedures: Left Heart Cath, Selective Coronary Angiography, LV angiography, pressure wire interrogation of the left circumflex. CXR  Hospital Course: Mr Tungate is a 46 year old male with no history of  Coronary artery disease. He had chest pain and was seen by Dr. Tawanna Cooler. He was sent to the hospital where he was admitted for further evaluation and treatment.  He was hypertensive so a beta blocker was added to his medication regimen. His cardiac enzymes were negative for MI. He was placed on heparin and nitrates and was pain-free. He was also on an aspirin a statin. He was taken to the cath lab on 05/03/2011.   The cardiac cath results and Dr Arida's recommendations are: Left Main: Normal in size and free of significant disease.  Left Anterior Descending (LAD): Normal in size with minor luminal irregularities. There is a mild 20-30% lesion in the distal LAD close to the apex.  1st diagonal (D1): Normal in size with minor irregularities.  2nd diagonal (D2): Small in size.  3rd diagonal (D3): Normal size and free of significant disease.  Circumflex (LCx): Normal in size and nondominant. The mid segment is ectatic followed by a 50% stenosis after OM2. There is another 50% lesion just before on 3.  1st obtuse marginal: Small in size and free of significant disease.  2nd obtuse marginal: Medium in size and free of significant  disease.  3rd obtuse marginal: Normal in size and free of significant disease.  Right Coronary Artery: Normal in size and dominant. There is mild atherosclerosis in the mid segment. Distally there is a 30-40% lesion.  posterior descending artery: Normal in size with minor irregularities.  posterior lateral branch: There are 2 medium-sized posterolateral branches which are free of significant disease.  Left ventriculography: Left ventricular systolic function is normal , LVEF is estimated at 55 %.  Final Conclusions:  1. moderate left circumflex stenosis estimated to be 50% at 2 separate mid locations. FFR ratio is 0.93 which indicates nonobstructive physiology. The patient otherwise has mild to moderate coronary artery disease.  2. Normal LV systolic function.  3. Mildly elevated left ventricular end-diastolic pressure likely due to diastolic dysfunction.  Recommendations:  I recommend aggressive medical therapy of his risk factors. His treatment of sleep apnea needs to be optimized. He likely can be discharged home tomorrow if he remains stable.  Mr. Scipio potassium was 3.4 and was supplemented. Recheck is pending. He was noted by the nursing staff to be bradycardic, with heart rates dropping into the 30s at times while asleep. Dr. Myrtis Ser reviewed the telemetry and he was going into second degree AV block, Mobitz 1. He was noted to be on high-dose diltiazem and had been started on a beta blocker. The beta blocker was discontinued and the diltiazem dose was decreased. This can be followed as an outpatient. Currently, no event monitor is ordered. He had been on  CPAP prior to admission and was encouraged to be compliant with this. He is also encouraged to stick to a low sodium diabetic diet.  On 05/04/2011, Mr. Bukhari was evaluated by Dr. Myrtis Ser. He was ambulating without chest pain or shortness of breath and considered stable for discharge, to followup as an outpatient.    Labs:   Lab Results    Component Value Date   WBC 6.6 05/04/2011   HGB 12.8* 05/04/2011   HCT 39.0 05/04/2011   MCV 81.8 05/04/2011   PLT 334 05/04/2011    Lab 05/03/11 0626  NA 139  K 3.4*  CL 103  CO2 25  BUN 14  CREATININE 0.76  CALCIUM 9.1  PROT --  BILITOT --  ALKPHOS --  ALT --  AST --  GLUCOSE 152*    Basename 05/03/11 0626 05/03/11 0117 05/02/11 1953  CKTOTAL 203 211 227  CKMB 2.4 2.5 2.6  CKMBINDEX -- -- --  TROPONINI <0.30 <0.30 <0.30   Lipid Panel     Component Value Date/Time   CHOL 193 05/03/2011 0626   TRIG 215* 05/03/2011 0626   HDL 35* 05/03/2011 0626   CHOLHDL 5.5 05/03/2011 0626   VLDL 43* 05/03/2011 0626   LDLCALC 115* 05/03/2011 0626    Basename 05/02/11 1953  INR 1.00       Radiology: Dg Chest 2 View  05/02/2011  *RADIOLOGY REPORT*  Clinical Data: 1-week history of intermittent stabbing chest pain. History of hypertension and diabetes.  CHEST - 2 VIEW 05/02/2011:  Comparison: Two-view chest x-ray 07/25/2007 [facility], 12/23/2003 [facility].  Findings: Cardiomediastinal silhouette unremarkable.  Suboptimal inspiration due to body habitus which accounts for crowded bronchovascular markings at the bases and accentuates the cardiac silhouette.  Taking this into account, lungs clear. Bronchovascular markings normal.  No pleural effusions. Degenerative changes involving the mid thoracic spine.  No significant interval change.  IMPRESSION: Suboptimal inspiration.  No acute cardiopulmonary disease.  Original Report Authenticated By: Arnell Sieving, M.D.    EKG: 04-May-2011 04:53:11  Sinus rhythm with 1st degree A-V block, rate 64. Incomplete right bundle branch block Voltage for LVH Since previous tracing 05/03/11 IRBBB seen  FOLLOW UP PLANS AND APPOINTMENTS Discharge Orders    Future Appointments: Provider: Department: Dept Phone: Center:   05/11/2011 9:30 AM Jacolyn Reedy, PA [facility] [phone] [facility]     No Known  Allergies Medication List  As of 05/04/2011 11:23 AM   TAKE these medications         aspirin EC 325 MG tablet   Take 325 mg by mouth daily.      atorvastatin 20 MG tablet   Commonly known as: LIPITOR   Take 20 mg by mouth at bedtime.      diltiazem 240 MG 24 hr capsule   Commonly known as: TIAZAC   Take 1 capsule (240 mg total) by mouth daily.      furosemide 40 MG tablet   Commonly known as: LASIX   Take 80 mg by mouth daily.      glucose blood test strip   1 each by Other route. Use as instructed      metFORMIN 1000 MG tablet   Commonly known as: GLUCOPHAGE   Take 1 tablet (1,000 mg total) by mouth 2 (two) times daily with a meal.      metFORMIN 500 MG tablet   Commonly known as: GLUCOPHAGE   Take 1 tablet (500 mg total) by mouth 2 (two)  times daily with a meal.      potassium chloride SA 20 MEQ tablet   Commonly known as: K-DUR,KLOR-CON   Take 40 mEq by mouth daily.      ramipril 5 MG capsule   Commonly known as: ALTACE   Take 1 capsule (5 mg total) by mouth daily.      tadalafil 20 MG tablet   Commonly known as: CIALIS   Take 20 mg by mouth daily as needed. For erectile dysfuntion      VENTOLIN HFA 108 (90 BASE) MCG/ACT inhaler   Generic drug: albuterol   Inhale 2 puffs into the lungs every 6 (six) hours as needed.           Follow-up Information    Follow up with TODD,Mardene Lessig ALLEN, MD in 2 weeks. (As needed)       Follow up with Jacolyn Reedy, PA. (February 28th at 9:30 am.)    Contact information:   [address], [address] [place] [address] [phone]          BRING ALL MEDICATIONS WITH YOU TO FOLLOW UP APPOINTMENTS  Time spent with patient to include physician time: 41 min Signed: Theodore Demark 05/04/2011, 11:23 AM  Patient seen and examined. I agree with the assessment and plan as detailed above. See also my additional thoughts below.   See my progress note from today. Meds adjusted for  bradycardia and AV block. Plan f/u with Dr Tawanna Cooler and Dr. Christene Slates, MD, [facility] 05/04/2011 1:10 PM

## 2011-05-11 ENCOUNTER — Ambulatory Visit: Payer: BC Managed Care – PPO | Admitting: Physician Assistant

## 2011-05-17 ENCOUNTER — Encounter: Payer: Self-pay | Admitting: Physician Assistant

## 2011-05-17 ENCOUNTER — Ambulatory Visit (INDEPENDENT_AMBULATORY_CARE_PROVIDER_SITE_OTHER): Payer: BC Managed Care – PPO | Admitting: Physician Assistant

## 2011-05-17 VITALS — BP 142/70 | HR 82 | Resp 18 | Ht 74.0 in | Wt >= 6400 oz

## 2011-05-17 DIAGNOSIS — I10 Essential (primary) hypertension: Secondary | ICD-10-CM

## 2011-05-17 DIAGNOSIS — I4430 Unspecified atrioventricular block: Secondary | ICD-10-CM

## 2011-05-17 DIAGNOSIS — I2510 Atherosclerotic heart disease of native coronary artery without angina pectoris: Secondary | ICD-10-CM

## 2011-05-17 DIAGNOSIS — R0989 Other specified symptoms and signs involving the circulatory and respiratory systems: Secondary | ICD-10-CM | POA: Insufficient documentation

## 2011-05-17 DIAGNOSIS — E669 Obesity, unspecified: Secondary | ICD-10-CM

## 2011-05-17 NOTE — Assessment & Plan Note (Signed)
Blood pressure stable on lower dose Tiazac. Recommend continued weight loss and 2 g sodium diet. Avoid beta blockers with bradycardia and AV block in the hospital

## 2011-05-17 NOTE — Progress Notes (Signed)
HPI:  This is a 46 year old African American male patient who recently underwent cardiac catheterization for chest pain. This revealed nonobstructive coronary disease with 50% left circumflex had 2 separate locations. He had normal LV function and mildly elevated left ventricular end-diastolic pressure likely due to diastolic dysfunction. Medical therapy was recommended.  The patient is doing well since his been home. He denies any further chest pain. He did have some bradycardia in the 30s and Mobitz one AV Block  in the hospital and his Tiazac was decreased and beta blocker stopped. The patient denies any further chest pain,palpitations, dyspnea, dyspnea on exertion, dizziness, or presyncope. He has lost 9 pounds since discharge.  No Known Allergies  Current Outpatient Prescriptions on File Prior to Visit  Medication Sig Dispense Refill  . albuterol (VENTOLIN HFA) 108 (90 BASE) MCG/ACT inhaler Inhale 2 puffs into the lungs every 6 (six) hours as needed.        Marland Kitchen aspirin EC 325 MG tablet Take 325 mg by mouth daily.      Marland Kitchen atorvastatin (LIPITOR) 20 MG tablet Take 20 mg by mouth at bedtime.      Marland Kitchen diltiazem (TIAZAC) 240 MG 24 hr capsule Take 1 capsule (240 mg total) by mouth daily.  30 capsule  11  . furosemide (LASIX) 40 MG tablet Take 80 mg by mouth daily.      Marland Kitchen glucose blood test strip 1 each by Other route. Use as instructed       . metFORMIN (GLUCOPHAGE) 1000 MG tablet Take 1 tablet (1,000 mg total) by mouth 2 (two) times daily with a meal.      . potassium chloride SA (K-DUR,KLOR-CON) 20 MEQ tablet Take 40 mEq by mouth daily.      . ramipril (ALTACE) 5 MG capsule Take 1 capsule (5 mg total) by mouth daily.  30 capsule  11  . tadalafil (CIALIS) 20 MG tablet Take 20 mg by mouth daily as needed. For erectile dysfuntion      . DISCONTD: metFORMIN (GLUCOPHAGE) 500 MG tablet Take 1 tablet (500 mg total) by mouth 2 (two) times daily with a meal.        Past Medical History  Diagnosis Date  .  DIABETES MELLITUS, TYPE II 10/17/2006  . HYPERLIPIDEMIA 10/17/2006  . OBESITY 10/17/2006  . HYPERTENSION 10/17/2006  . PERIPHERAL VASCULAR DISEASE 11/26/2007  . Asthma   . Chest pain     a. 05/2001 - Cardiolite: EF 47%, Diaphragmatic Attenuation.  Marland Kitchen GERD (gastroesophageal reflux disease)   . Paroxysmal atrial fibrillation     2006 - was on coumadin - took himself off 6 mos after initiation.  . Angina   . Shortness of breath   . Sleep apnea     not using CPAP regularly  . ED (erectile dysfunction)   . Arthritis     Past Surgical History  Procedure Date  . Hernia repair   . Hernia repair   . Skin grafts   . Arthroscopic knee     Family History  Problem Relation Age of Onset  . Diabetes Maternal Grandmother   . Hypertension Brother   . Heart attack Sister     died mid 32's  . Heart attack Brother     died early 81's  . Hypertension Sister   . Cerebral aneurysm Mother     died mid 80's  . Lung cancer Father     died @ 33    History   Social History  .  Marital Status: Married    Spouse Name: N/A    Number of Children: N/A  . Years of Education: N/A   Occupational History  . Not on file.   Social History Main Topics  . Smoking status: Never Smoker   . Smokeless tobacco: Never Used  . Alcohol Use: No  . Drug Use: No  . Sexually Active: Yes   Other Topics Concern  . 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  Lives in [place] with wife - 2 children - 13, 10.    ROS:see history of present illness otherwise negative   PHYSICAL EXAM: Obese, in no acute distress. Neck: Bilateral carotid bruits,No JVD, HJR, or thyroid enlargement Lungs: No tachypnea, clear without wheezing, rales, or rhonchi Cardiovascular: RRR, PMI not displaced, heart sounds normal, no murmurs, gallops, bruit, thrill, or heave. Abdomen: BS normal. Soft without organomegaly, masses, lesions or tenderness. Extremities:Right radial artery without hematoma or hemorrhage, good  brachial pulse, lower extremities without cyanosis, clubbing or edema. Good distal pulses bilateral SKin: Warm, no lesions or rashes  Musculoskeletal: No deformities Neuro: no focal signs  BP 142/70  Pulse 82  Resp 18  Ht 6\' 2"  (1.88 m)  Wt 401 lb 12.8 oz (182.255 kg)  BMI 51.59 kg/m2

## 2011-05-17 NOTE — Assessment & Plan Note (Signed)
Patient has lost 9 pounds since discharge from the hospital. Recommend continued weight loss program.

## 2011-05-17 NOTE — Patient Instructions (Signed)
Your physician has requested that you have a carotid duplex. This test is an ultrasound of the carotid arteries in your neck. It looks at blood flow through these arteries that supply the brain with blood. Allow one hour for this exam. There are no restrictions or special instructions.  Your physician recommends that you schedule a follow-up appointment in: 2-3 months with Dr. Lewayne Bunting.

## 2011-05-17 NOTE — Assessment & Plan Note (Signed)
Patient has bilateral carotid bruits. We'll check carotid Dopplers.

## 2011-05-17 NOTE — Assessment & Plan Note (Signed)
Resolved in the hospital off beta blocker and on lower dose Tiazac

## 2011-07-25 ENCOUNTER — Ambulatory Visit: Payer: BC Managed Care – PPO | Admitting: Internal Medicine

## 2011-08-18 ENCOUNTER — Ambulatory Visit: Payer: BC Managed Care – PPO | Admitting: Internal Medicine

## 2011-09-06 ENCOUNTER — Encounter: Payer: Self-pay | Admitting: Internal Medicine

## 2011-10-25 ENCOUNTER — Other Ambulatory Visit: Payer: Self-pay | Admitting: Family Medicine

## 2011-10-26 ENCOUNTER — Other Ambulatory Visit: Payer: Self-pay | Admitting: Family Medicine

## 2012-02-05 ENCOUNTER — Other Ambulatory Visit: Payer: Self-pay | Admitting: Family Medicine

## 2012-03-04 ENCOUNTER — Ambulatory Visit (INDEPENDENT_AMBULATORY_CARE_PROVIDER_SITE_OTHER): Payer: BC Managed Care – PPO | Admitting: Family Medicine

## 2012-03-04 ENCOUNTER — Encounter: Payer: Self-pay | Admitting: Family Medicine

## 2012-03-04 VITALS — BP 150/90 | HR 86 | Temp 98.7°F | Wt >= 6400 oz

## 2012-03-04 DIAGNOSIS — N50812 Left testicular pain: Secondary | ICD-10-CM

## 2012-03-04 DIAGNOSIS — N509 Disorder of male genital organs, unspecified: Secondary | ICD-10-CM

## 2012-03-04 MED ORDER — CEFTRIAXONE SODIUM 500 MG IJ SOLR
250.0000 mg | Freq: Once | INTRAMUSCULAR | Status: AC
  Administered 2012-03-04: 250 mg via INTRAMUSCULAR

## 2012-03-04 MED ORDER — DOXYCYCLINE HYCLATE 100 MG PO TABS: 100.0000 mg | ORAL_TABLET | Freq: Two times a day (BID) | ORAL | Status: DC

## 2012-03-04 NOTE — Patient Instructions (Addendum)
-  As we discussed, we have prescribed a new medication(doxycycline) for you at this appointment. We discussed the common and serious potential adverse effects of this medication and you can review these and more with the pharmacist when you pick up your medication.  Please follow the instructions for use carefully and notify us immediately if you have any problems taking this medication.  -please see a doctor immediately if fevers, vomiting or worsening pain  -follow up in 2-3 days unless better

## 2012-03-04 NOTE — Addendum Note (Signed)
Addended by: Earle Gell C on: 03/04/2012 10:00 AM   Modules accepted: Orders

## 2012-03-04 NOTE — Progress Notes (Signed)
Chief Complaint  Patient presents with  . pain in groin area    HPI:  Acute visit for Pain in Groin: Started: 3 days nand gradually has worsened Characterized as: L testicular pain Other symptoms: none Denies: fevers, burning with urination, swelling or redness, nausea, vomiting, change in bowels Hx of hernia on R  ROS: See pertinent positives and negatives per HPI.  Past Medical History  Diagnosis Date  . DIABETES MELLITUS, TYPE II 10/17/2006  . HYPERLIPIDEMIA 10/17/2006  . OBESITY 10/17/2006  . HYPERTENSION 10/17/2006  . PERIPHERAL VASCULAR DISEASE 11/26/2007  . Asthma   . Chest pain     a. 05/2001 - Cardiolite: EF 47%, Diaphragmatic Attenuation.  Marland Kitchen GERD (gastroesophageal reflux disease)   . Paroxysmal atrial fibrillation     2006 - was on coumadin - took himself off 6 mos after initiation.  . Angina   . Shortness of breath   . Sleep apnea     not using CPAP regularly  . ED (erectile dysfunction)   . Arthritis     Family History  Problem Relation Age of Onset  . Diabetes Maternal Grandmother   . Hypertension Brother   . Heart attack Sister     died mid 38's  . Heart attack Brother     died early 104's  . Hypertension Sister   . Cerebral aneurysm Mother     died mid 12's  . Lung cancer Father     died @ 62    History   Social History  . Marital Status: Married    Spouse Name: N/A    Number of Children: N/A  . Years of Education: N/A   Social History Main Topics  . Smoking status: Never Smoker   . Smokeless tobacco: Never Used  . Alcohol Use: No  . Drug Use: No  . Sexually Active: Yes   Other Topics Concern  . None   Social History Narrative   Works at [employer] engineer.  Lives in [place] with wife - 2 children - 13, 10.    Current outpatient prescriptions:albuterol (VENTOLIN HFA) 108 (90 BASE) MCG/ACT inhaler, Inhale 2 puffs into the lungs every 6 (six) hours as needed.  , Disp: , Rfl: ;  aspirin EC 325 MG tablet, Take 325 mg by mouth daily., Disp: , Rfl:  ;  atorvastatin (LIPITOR) 20 MG tablet, Take 20 mg by mouth at bedtime., Disp: , Rfl: ;  atorvastatin (LIPITOR) 20 MG tablet, TAKE ONE TABLET BY MOUTH AT BEDTIME, Disp: 90 tablet, Rfl: 1 diltiazem (TIAZAC) 240 MG 24 hr capsule, Take 1 capsule (240 mg total) by mouth daily., Disp: 30 capsule, Rfl: 11;  furosemide (LASIX) 40 MG tablet, Take 80 mg by mouth daily., Disp: , Rfl: ;  furosemide (LASIX) 40 MG tablet, TAKE TWO TABLETS BY MOUTH IN THE MORNING, Disp: 180 tablet, Rfl: 3;  furosemide (LASIX) 40 MG tablet, TAKE TWO TABLETS BY MOUTH IN THE MORNING, Disp: 180 tablet, Rfl: 0 glucose blood test strip, 1 each by Other route. Use as instructed , Disp: , Rfl: ;  KLOR-CON M20 20 MEQ tablet, TAKE TWO TABLETS BY MOUTH IN THE MORNING, Disp: 90 each, Rfl: 1;  metFORMIN (GLUCOPHAGE) 1000 MG tablet, Take 1 tablet (1,000 mg total) by mouth 2 (two) times daily with a meal., Disp: , Rfl: ;  metFORMIN (GLUCOPHAGE) 1000 MG tablet, TAKE ONE TABLET BY MOUTH TWICE DAILY, Disp: 180 tablet, Rfl: 1 potassium chloride SA (K-DUR,KLOR-CON) 20 MEQ tablet, Take 40 mEq by mouth  daily., Disp: , Rfl: ;  ramipril (ALTACE) 5 MG capsule, Take 1 capsule (5 mg total) by mouth daily., Disp: 30 capsule, Rfl: 11;  tadalafil (CIALIS) 20 MG tablet, Take 20 mg by mouth daily as needed. For erectile dysfuntion, Disp: , Rfl: ;  doxycycline (VIBRA-TABS) 100 MG tablet, Take 1 tablet (100 mg total) by mouth 2 (two) times daily., Disp: 20 tablet, Rfl: 0  EXAM:  Filed Vitals:   03/04/12 0910  BP: 150/90  Pulse: 86  Temp: 98.7 F (37.1 C)    There is no height on file to calculate BMI.  GENERAL: vitals reviewed and listed above, alert, oriented, appears well hydrated and in no acute distress  HEENT: atraumatic, conjunttiva clear, no obvious abnormalities on inspection of external nose and ears  NECK: no obvious masses on inspection  LUNGS: clear to auscultation bilaterally, no wheezes, rales or rhonchi, good air movement  CV: HRRR, no  peripheral edema  GU: cremasteric reflex normal, normal appearance of testicles and penis, TTP L epididymis, no swelling or erythema, no inguinal hernia appreciated  MS: moves all extremities without noticeable abnormality  PSYCH: pleasant and cooperative, no obvious depression or anxiety  ASSESSMENT AND PLAN:  Discussed the following assessment and plan:  1. Testicular pain, left  doxycycline (VIBRA-TABS) 100 MG tablet   -discussed that most likely dx epididymitis - advised [place] to r/o other possible etiologies and offered referral to urology, pt refused these today and would like to hold off on these and tx empirically - while unlikely, he understands risks and possibility of other etiologies such as torsion, and more severe infections. -rocephin 250mg  IM and doxy 100mg  bid for 10 days given -UA and urine culture pending -Patient advised to return or notify a doctor immediately if symptoms worsen or persist or new concerns arise.  Patient Instructions  -As we discussed, we have prescribed a new medication(doxycycline) for you at this appointment. We discussed the common and serious potential adverse effects of this medication and you can review these and more with the pharmacist when you pick up your medication.  Please follow the instructions for use carefully and notify us immediately if you have any problems taking this medication.  -please see a doctor immediately if fevers, vomiting or worsening pain  -follow up in 2-3 days unless better      KIM, HANNAH R.

## 2012-03-04 NOTE — Addendum Note (Signed)
Addended by: Terressa Koyanagi on: 03/04/2012 05:21 PM   Modules accepted: Orders

## 2012-03-06 ENCOUNTER — Telehealth: Payer: Self-pay | Admitting: Family Medicine

## 2012-03-06 LAB — GC/CHLAMYDIA PROBE AMP, URINE
Chlamydia, Swab/Urine, PCR: NEGATIVE
GC Probe Amp, Urine: NEGATIVE

## 2012-03-06 LAB — URINE CULTURE
Colony Count: NO GROWTH
Organism ID, Bacteria: NO GROWTH

## 2012-03-06 NOTE — Telephone Encounter (Signed)
Please call and see how [name] is doing. I hope he is feeling better.If he is still sick we will need to get him to see urology. His urine tests were normal.

## 2012-03-07 NOTE — Telephone Encounter (Signed)
Left message on machine for patient to return our call regarding how he is feeling.

## 2012-03-08 NOTE — Telephone Encounter (Signed)
Left message on machine for patient to return our call 

## 2012-03-11 NOTE — Telephone Encounter (Signed)
Left a message for pt at cell phone number and home number.

## 2012-03-14 NOTE — Telephone Encounter (Signed)
Left a message for pt to return call 

## 2012-03-23 ENCOUNTER — Other Ambulatory Visit: Payer: Self-pay | Admitting: Family Medicine

## 2012-05-05 ENCOUNTER — Other Ambulatory Visit (HOSPITAL_COMMUNITY): Payer: Self-pay | Admitting: Physician Assistant

## 2012-05-07 ENCOUNTER — Other Ambulatory Visit: Payer: Self-pay | Admitting: *Deleted

## 2012-05-07 MED ORDER — POTASSIUM CHLORIDE CRYS ER 20 MEQ PO TBCR: 40.0000 meq | EXTENDED_RELEASE_TABLET | Freq: Every day | ORAL | Status: DC

## 2012-05-08 ENCOUNTER — Telehealth: Payer: Self-pay | Admitting: Family Medicine

## 2012-05-08 DIAGNOSIS — N50812 Left testicular pain: Secondary | ICD-10-CM

## 2012-05-08 NOTE — Telephone Encounter (Signed)
Please refill patient's meds as his appt was bumped and he is completely out.

## 2012-05-08 NOTE — Telephone Encounter (Signed)
I can't even see what needs refilled. I only saw the patient once, so will not refill the Rx. Either Bjorn Loser, Dr. Ladona Ridgel, or Dr. Tawanna Cooler should refill Rx

## 2012-05-09 ENCOUNTER — Other Ambulatory Visit: Payer: Self-pay | Admitting: Cardiology

## 2012-05-09 ENCOUNTER — Ambulatory Visit: Payer: BC Managed Care – PPO | Admitting: Family Medicine

## 2012-05-09 MED ORDER — RAMIPRIL 5 MG PO CAPS: 5.0000 mg | ORAL_CAPSULE | Freq: Every day | ORAL | Status: DC

## 2012-05-09 MED ORDER — DILTIAZEM HCL ER BEADS 240 MG PO CP24: 240.0000 mg | ORAL_CAPSULE | Freq: Every day | ORAL | Status: DC

## 2012-05-09 NOTE — Telephone Encounter (Signed)
Left message on machine on machine for patient to return our call with names of medication and where he would like his refills to go

## 2012-05-10 MED ORDER — RAMIPRIL 5 MG PO CAPS: 5.0000 mg | ORAL_CAPSULE | Freq: Every day | ORAL | Status: DC

## 2012-05-10 MED ORDER — DILTIAZEM HCL ER 240 MG PO CP24: 240.0000 mg | ORAL_CAPSULE | Freq: Every day | ORAL | Status: DC

## 2012-06-03 ENCOUNTER — Ambulatory Visit: Payer: BC Managed Care – PPO | Admitting: Family Medicine

## 2012-06-24 ENCOUNTER — Encounter: Payer: Self-pay | Admitting: Family Medicine

## 2012-06-24 ENCOUNTER — Ambulatory Visit (INDEPENDENT_AMBULATORY_CARE_PROVIDER_SITE_OTHER): Payer: BC Managed Care – PPO | Admitting: Family Medicine

## 2012-06-24 VITALS — BP 130/90 | Temp 98.3°F | Wt >= 6400 oz

## 2012-06-24 DIAGNOSIS — E785 Hyperlipidemia, unspecified: Secondary | ICD-10-CM

## 2012-06-24 DIAGNOSIS — N50812 Left testicular pain: Secondary | ICD-10-CM

## 2012-06-24 DIAGNOSIS — N509 Disorder of male genital organs, unspecified: Secondary | ICD-10-CM

## 2012-06-24 DIAGNOSIS — E669 Obesity, unspecified: Secondary | ICD-10-CM

## 2012-06-24 DIAGNOSIS — E119 Type 2 diabetes mellitus without complications: Secondary | ICD-10-CM

## 2012-06-24 DIAGNOSIS — I10 Essential (primary) hypertension: Secondary | ICD-10-CM

## 2012-06-24 LAB — BASIC METABOLIC PANEL
BUN: 12 mg/dL (ref 6–23)
CO2: 31 mEq/L (ref 19–32)
Calcium: 9.3 mg/dL (ref 8.4–10.5)
Chloride: 99 mEq/L (ref 96–112)
Creatinine, Ser: 0.8 mg/dL (ref 0.4–1.5)
GFR: 132.29 mL/min (ref >60.00)
Glucose, Bld: 180 mg/dL — ABNORMAL HIGH (ref 70–99)
Potassium: 4.1 mEq/L (ref 3.5–5.1)
Sodium: 138 mEq/L (ref 135–145)

## 2012-06-24 LAB — CBC WITH DIFFERENTIAL/PLATELET
Basophils Absolute: 0 10*3/uL (ref 0.0–0.1)
Basophils Relative: 0.7 % (ref 0.0–3.0)
Eosinophils Absolute: 0.3 10*3/uL (ref 0.0–0.7)
Eosinophils Relative: 5.9 % — ABNORMAL HIGH (ref 0.0–5.0)
HCT: 42 % (ref 39.0–52.0)
Hemoglobin: 13.8 g/dL (ref 13.0–17.0)
Lymphocytes Relative: 33.4 % (ref 12.0–46.0)
Lymphs Abs: 1.7 10*3/uL (ref 0.7–4.0)
MCHC: 32.9 g/dL (ref 30.0–36.0)
MCV: 81.7 fl (ref 78.0–100.0)
Monocytes Absolute: 0.5 10*3/uL (ref 0.1–1.0)
Monocytes Relative: 9.9 % (ref 3.0–12.0)
Neutro Abs: 2.5 10*3/uL (ref 1.4–7.7)
Neutrophils Relative %: 50.1 % (ref 43.0–77.0)
Platelets: 295 10*3/uL (ref 150.0–400.0)
RBC: 5.14 Mil/uL (ref 4.22–5.81)
RDW: 14.6 % (ref 11.5–14.6)
WBC: 5 10*3/uL (ref 4.5–10.5)

## 2012-06-24 LAB — HEPATIC FUNCTION PANEL
ALT: 27 U/L (ref 0–53)
AST: 19 U/L (ref 0–37)
Albumin: 4.3 g/dL (ref 3.5–5.2)
Alkaline Phosphatase: 66 U/L (ref 39–117)
Bilirubin, Direct: 0 mg/dL (ref 0.0–0.3)
Total Bilirubin: 0.7 mg/dL (ref 0.3–1.2)
Total Protein: 8.2 g/dL (ref 6.0–8.3)

## 2012-06-24 LAB — POCT URINALYSIS DIPSTICK
Bilirubin, UA: NEGATIVE
Glucose, UA: NEGATIVE
Ketones, UA: NEGATIVE
Leukocytes, UA: NEGATIVE
Nitrite, UA: NEGATIVE
Protein, UA: NEGATIVE
Spec Grav, UA: 1.015
Urobilinogen, UA: 0.2
pH, UA: 7.5

## 2012-06-24 LAB — TSH: TSH: 0.52 u[IU]/mL (ref 0.35–5.50)

## 2012-06-24 LAB — LIPID PANEL
Cholesterol: 222 mg/dL — ABNORMAL HIGH (ref 0–200)
HDL: 35.1 mg/dL — ABNORMAL LOW (ref >39.00)
Total CHOL/HDL Ratio: 6
Triglycerides: 149 mg/dL (ref 0.0–149.0)
VLDL: 29.8 mg/dL (ref 0.0–40.0)

## 2012-06-24 LAB — MICROALBUMIN / CREATININE URINE RATIO
Creatinine,U: 48.2 mg/dL
Microalb Creat Ratio: 1 mg/g (ref 0.0–30.0)
Microalb, Ur: 0.5 mg/dL (ref 0.0–1.9)

## 2012-06-24 LAB — HEMOGLOBIN A1C: Hgb A1c MFr Bld: 7.8 % — ABNORMAL HIGH (ref 4.6–6.5)

## 2012-06-24 LAB — LDL CHOLESTEROL, DIRECT: Direct LDL: 171 mg/dL

## 2012-06-24 MED ORDER — DOXYCYCLINE HYCLATE 100 MG PO TABS: 100.0000 mg | ORAL_TABLET | Freq: Two times a day (BID) | ORAL | Status: DC

## 2012-06-24 MED ORDER — TADALAFIL 20 MG PO TABS: 20.0000 mg | ORAL_TABLET | Freq: Every day | ORAL | Status: DC | PRN

## 2012-06-24 MED ORDER — FUROSEMIDE 40 MG PO TABS: ORAL_TABLET | ORAL | Status: DC

## 2012-06-24 MED ORDER — POTASSIUM CHLORIDE CRYS ER 20 MEQ PO TBCR: EXTENDED_RELEASE_TABLET | ORAL | Status: DC

## 2012-06-24 MED ORDER — METFORMIN HCL 1000 MG PO TABS: 1000.0000 mg | ORAL_TABLET | Freq: Two times a day (BID) | ORAL | Status: DC

## 2012-06-24 MED ORDER — RAMIPRIL 5 MG PO CAPS: 5.0000 mg | ORAL_CAPSULE | Freq: Every day | ORAL | Status: DC

## 2012-06-24 MED ORDER — ATORVASTATIN CALCIUM 20 MG PO TABS: ORAL_TABLET | ORAL | Status: DC

## 2012-06-24 MED ORDER — DILTIAZEM HCL ER BEADS 240 MG PO CP24: 240.0000 mg | ORAL_CAPSULE | Freq: Every day | ORAL | Status: DC

## 2012-06-24 NOTE — Patient Instructions (Signed)
Continue your current medications  Restart the Vibramycin 1 tab daily for 3 weeks for the epididymitis  We will set you up a nutrition consult  Return in 4 weeks for followup

## 2012-06-24 NOTE — Progress Notes (Signed)
  Subjective:    Patient ID: Raymond Lutz, male    DOB: 06-28-1965, 47 y.o.   MRN: [id number]  HPI Marky is a 47 year old married male nonsmoker who comes in today because his medications are almost gone. He has the classic metabolic syndrome with hypertension diabetes type 2 hyperlipidemia and obesity. His weight is up to 418 pounds. His blood pressure normal 130/80. He does not check his blood sugar. Last time was 3 months ago. He was supposed to come back for an A1c but never returned.     Review of Systems Review of systems otherwise negative    Objective:   Physical Exam  Well-developed overweight male no acute distress BP right arm sitting position 130/90 weight 418 pounds      Assessment & Plan:  Metabolic syndrome with nonadherence to diet exercise medication and medical program plan,,,,,,,,,, renew medications, nutrition consult, labs today, careful followup to try to increase compliance

## 2012-07-19 ENCOUNTER — Encounter: Payer: Self-pay | Admitting: Family Medicine

## 2012-07-22 ENCOUNTER — Ambulatory Visit (INDEPENDENT_AMBULATORY_CARE_PROVIDER_SITE_OTHER): Payer: BC Managed Care – PPO | Admitting: Family Medicine

## 2012-07-22 ENCOUNTER — Encounter: Payer: Self-pay | Admitting: Family Medicine

## 2012-07-22 VITALS — BP 180/100 | Temp 98.6°F | Wt >= 6400 oz

## 2012-07-22 DIAGNOSIS — E119 Type 2 diabetes mellitus without complications: Secondary | ICD-10-CM

## 2012-07-22 DIAGNOSIS — I10 Essential (primary) hypertension: Secondary | ICD-10-CM

## 2012-07-22 MED ORDER — GLIPIZIDE 5 MG PO TABS: 5.0000 mg | ORAL_TABLET | Freq: Two times a day (BID) | ORAL | Status: DC

## 2012-07-22 NOTE — Patient Instructions (Signed)
Continue your current blood pressure medication,,,,,,,,,, check your blood pressure daily in the morning  For your diabetes............... Walk 30 minutes daily..........Marland Kitchen Decrease the metformin to 500 mg twice daily and add glipizide 5 mg twice daily  Check a fasting blood sugar daily in the morning  Return in 3 weeks with all your blood sugar  and blood pressure readings

## 2012-07-22 NOTE — Progress Notes (Signed)
  Subjective:    Patient ID: Raymond Lutz, male    DOB: 05-24-1965, 47 y.o.   MRN: [id number]  HPI  Jenkins is a 47 year old male nonsmoker who comes in today for evaluation of hypertension and diabetes  His weight is now 405 pounds  His blood pressure today is 180/100. He states there is a lot of stress at work and is requesting a couple days off. I explained we cannot give him a medical leave under those circumstances  He states his blood pressure at home averages 140/90  He does not check his blood sugar on a regular basis. Recently he's not taking his metformin because his having GI side effects. Recent A1c up to 7.8%  Review of Systems    review of systems otherwise negative except for stress at work Objective:   Physical Exam Overweight male in no acute distress examination of feet shows normal pulses normal skin normal neurologic exam some fungal infection in his toenails advised to soak and file weekly       Assessment & Plan:  Hypertension at goal?????????? BP check daily followup in 3 weeks  Diabetes not at goal decrease metformin to 500 twice a day, add glipizide 5 mg twice a day, followup in 3 weeks  Obesity again diet exercise and weight loss

## 2012-07-23 ENCOUNTER — Encounter: Payer: Self-pay | Admitting: *Deleted

## 2012-07-23 ENCOUNTER — Encounter: Payer: Self-pay | Admitting: Family Medicine

## 2012-07-24 ENCOUNTER — Encounter: Payer: Self-pay | Admitting: Family Medicine

## 2012-07-24 ENCOUNTER — Ambulatory Visit (INDEPENDENT_AMBULATORY_CARE_PROVIDER_SITE_OTHER): Payer: BC Managed Care – PPO | Admitting: Family Medicine

## 2012-07-24 VITALS — BP 178/98 | Temp 98.7°F | Wt >= 6400 oz

## 2012-07-24 DIAGNOSIS — N50812 Left testicular pain: Secondary | ICD-10-CM

## 2012-07-24 DIAGNOSIS — E785 Hyperlipidemia, unspecified: Secondary | ICD-10-CM

## 2012-07-24 DIAGNOSIS — E119 Type 2 diabetes mellitus without complications: Secondary | ICD-10-CM

## 2012-07-24 DIAGNOSIS — R0989 Other specified symptoms and signs involving the circulatory and respiratory systems: Secondary | ICD-10-CM

## 2012-07-24 DIAGNOSIS — I2510 Atherosclerotic heart disease of native coronary artery without angina pectoris: Secondary | ICD-10-CM

## 2012-07-24 DIAGNOSIS — N509 Disorder of male genital organs, unspecified: Secondary | ICD-10-CM

## 2012-07-24 DIAGNOSIS — E669 Obesity, unspecified: Secondary | ICD-10-CM

## 2012-07-24 DIAGNOSIS — I10 Essential (primary) hypertension: Secondary | ICD-10-CM

## 2012-07-24 MED ORDER — POTASSIUM CHLORIDE CRYS ER 20 MEQ PO TBCR: EXTENDED_RELEASE_TABLET | ORAL | Status: DC

## 2012-07-24 MED ORDER — LISINOPRIL 40 MG PO TABS: 40.0000 mg | ORAL_TABLET | Freq: Every day | ORAL | Status: DC

## 2012-07-24 NOTE — Patient Instructions (Addendum)
Rest at home  Lisinopril 40 mg daily,,,,,,,,,,,,,stop the Altace  Lasix 80 mg daily  Diltiazem 240 mg daily  Potassium 2 tabs daily in the morning  Check your blood pressure 4 times daily  No salt  Return tomorrow afternoon for followup with a record of all your blood pressure readings

## 2012-07-24 NOTE — Progress Notes (Signed)
  Subjective:    Patient ID: Raymond Lutz, male    DOB: 1965/07/21, 47 y.o.   MRN: [id number]  HPI  Raymond Lutz is a 47 year old male who comes in today for evaluation of hypertension  His blood pressure yesterday shot up to 180/100. He called and we kept him at bedrest and increased his Altase Lasix. He comes in today for followup. BP today was 180/100 right arm sitting position when he came in. He was otherwise asymptomatic. No chest pain.  He was given clonidine 0.2 and Benicar 20 mg at 9:15 AM. At 9:30 his blood pressure dropped to 130/80.  Review of Systems    review of systems otherwise negative Objective:   Physical Exam Well-developed well-nourished overweight male no acute distress BP on admission was 180/100 prior to leaving it was 130/80       Assessment & Plan:  Hypertension............ Normalized with clonidine and Benicar,,,,,,,, rest at home Ultram medication as outlined followup tomorrow afternoon

## 2012-07-25 ENCOUNTER — Ambulatory Visit (INDEPENDENT_AMBULATORY_CARE_PROVIDER_SITE_OTHER): Payer: BC Managed Care – PPO | Admitting: Family Medicine

## 2012-07-25 ENCOUNTER — Encounter: Payer: Self-pay | Admitting: Family Medicine

## 2012-07-25 VITALS — BP 120/70 | Temp 98.0°F | Wt >= 6400 oz

## 2012-07-25 DIAGNOSIS — I10 Essential (primary) hypertension: Secondary | ICD-10-CM

## 2012-07-25 NOTE — Patient Instructions (Signed)
Continue your current medications  Check your blood pressure daily in the morning  Return in 3 weeks for followup,,,,,,,, bring a record of volume blood pressure readings and the device with you

## 2012-07-25 NOTE — Progress Notes (Signed)
  Subjective:    Patient ID: Raymond Lutz, male    DOB: 01/22/1966, 47 y.o.   MRN: [id number]  HPI Raymond Lutz is a 47 year old male who comes in today for followup of hypertension  Is had initially been seeing him every 24 hours because his blood pressure was markedly elevated. He's been at bedrest at home. Blood pressure today 120/70.   Review of Systems Review of systems negative    Objective:   Physical Exam  Well-developed well-nourished male no acute distress BP right arm sitting position 120/70      Assessment & Plan:  Hypertension at goal,,,,,,,,,,,,, continue current therapy okay to ambulate BP check daily followup in 3 weeks

## 2012-08-12 ENCOUNTER — Ambulatory Visit: Payer: BC Managed Care – PPO | Admitting: Family Medicine

## 2012-08-16 ENCOUNTER — Ambulatory Visit: Payer: BC Managed Care – PPO | Admitting: *Deleted

## 2012-09-03 ENCOUNTER — Encounter: Payer: Self-pay | Admitting: Family Medicine

## 2012-09-03 DIAGNOSIS — E119 Type 2 diabetes mellitus without complications: Secondary | ICD-10-CM

## 2012-09-03 DIAGNOSIS — I10 Essential (primary) hypertension: Secondary | ICD-10-CM

## 2012-09-03 DIAGNOSIS — E669 Obesity, unspecified: Secondary | ICD-10-CM

## 2012-09-03 DIAGNOSIS — E785 Hyperlipidemia, unspecified: Secondary | ICD-10-CM

## 2012-09-03 DIAGNOSIS — N50812 Left testicular pain: Secondary | ICD-10-CM

## 2012-09-04 MED ORDER — TADALAFIL 20 MG PO TABS: 20.0000 mg | ORAL_TABLET | Freq: Every day | ORAL | Status: DC | PRN

## 2012-12-25 ENCOUNTER — Other Ambulatory Visit: Payer: Self-pay | Admitting: Family Medicine

## 2012-12-25 DIAGNOSIS — E669 Obesity, unspecified: Secondary | ICD-10-CM

## 2012-12-25 DIAGNOSIS — N50812 Left testicular pain: Secondary | ICD-10-CM

## 2012-12-25 DIAGNOSIS — I10 Essential (primary) hypertension: Secondary | ICD-10-CM

## 2012-12-25 DIAGNOSIS — E119 Type 2 diabetes mellitus without complications: Secondary | ICD-10-CM

## 2012-12-25 DIAGNOSIS — E785 Hyperlipidemia, unspecified: Secondary | ICD-10-CM

## 2012-12-25 MED ORDER — TADALAFIL 20 MG PO TABS: 20.0000 mg | ORAL_TABLET | Freq: Every day | ORAL | Status: DC | PRN

## 2012-12-26 LAB — HM DIABETES FOOT EXAM

## 2012-12-26 MED ORDER — TADALAFIL 2.5 MG PO TABS: 1.0000 | ORAL_TABLET | Freq: Every day | ORAL | Status: DC

## 2012-12-26 NOTE — Addendum Note (Signed)
Addended by: Kern Reap B on: 12/26/2012 12:32 PM   Modules accepted: Orders

## 2013-01-03 ENCOUNTER — Encounter: Payer: Self-pay | Admitting: Family Medicine

## 2013-01-16 ENCOUNTER — Other Ambulatory Visit: Payer: Self-pay

## 2013-06-11 ENCOUNTER — Encounter (HOSPITAL_COMMUNITY): Payer: Self-pay | Admitting: Emergency Medicine

## 2013-06-11 ENCOUNTER — Emergency Department (INDEPENDENT_AMBULATORY_CARE_PROVIDER_SITE_OTHER)
Admission: EM | Admit: 2013-06-11 | Discharge: 2013-06-11 | Disposition: A | Discharge status: Home / Self Care | Payer: 59 | Source: Home / Self Care | Attending: Family Medicine | Admitting: Family Medicine

## 2013-06-11 DIAGNOSIS — K529 Noninfective gastroenteritis and colitis, unspecified: Secondary | ICD-10-CM

## 2013-06-11 DIAGNOSIS — K5289 Other specified noninfective gastroenteritis and colitis: Secondary | ICD-10-CM

## 2013-06-11 MED ORDER — ONDANSETRON 4 MG PO TBDP
ORAL_TABLET | ORAL | Status: AC
  Filled 2013-06-11: qty 2

## 2013-06-11 MED ORDER — ONDANSETRON 4 MG PO TBDP
8.0000 mg | ORAL_TABLET | Freq: Once | ORAL | Status: AC
  Administered 2013-06-11: 8 mg via ORAL

## 2013-06-11 MED ORDER — ONDANSETRON HCL 4 MG PO TABS: 4.0000 mg | ORAL_TABLET | Freq: Four times a day (QID) | ORAL | Status: DC

## 2013-06-11 NOTE — ED Provider Notes (Signed)
CSN: [id number]     Arrival date & time 06/11/13  1855 History   First MD Initiated Contact with Patient 06/11/13 1946     No chief complaint on file.  (Consider location/radiation/quality/duration/timing/severity/associated sxs/prior Treatment) Patient is a 48 y.o. male presenting with vomiting.  Emesis Severity:  Moderate Duration:  3 days Timing:  Intermittent Quality:  Stomach contents Progression:  Unchanged Chronicity:  New Associated symptoms: diarrhea   Associated symptoms: no abdominal pain, no chills, no cough and no fever   Risk factors: sick contacts     Past Medical History  Diagnosis Date  . DIABETES MELLITUS, TYPE II 10/17/2006  . HYPERLIPIDEMIA 10/17/2006  . OBESITY 10/17/2006  . HYPERTENSION 10/17/2006  . PERIPHERAL VASCULAR DISEASE 11/26/2007  . Asthma   . Chest pain     a. 05/2001 - Cardiolite: EF 47%, Diaphragmatic Attenuation.  Marland Kitchen. GERD (gastroesophageal reflux disease)   . Paroxysmal atrial fibrillation     2006 - was on coumadin - took himself off 6 mos after initiation.  . Angina   . Shortness of breath   . Sleep apnea     not using CPAP regularly  . ED (erectile dysfunction)   . Arthritis    Past Surgical History  Procedure Laterality Date  . Hernia repair    . Hernia repair    . Skin grafts    . Arthroscopic knee     Family History  Problem Relation Age of Onset  . Diabetes Maternal Grandmother   . Hypertension Brother   . Heart attack Sister     died mid 6250's  . Heart attack Brother     died early 9150's  . Hypertension Sister   . Cerebral aneurysm Mother     died mid 5860's  . Lung cancer Father     died @ 2070   History  Substance Use Topics  . Smoking status: Never Smoker   . Smokeless tobacco: Never Used  . Alcohol Use: No    Review of Systems  Constitutional: Positive for appetite change and fatigue. Negative for fever and chills.  HENT: Negative.   Respiratory: Negative.   Cardiovascular: Negative.   Gastrointestinal: Positive for  nausea, vomiting and diarrhea. Negative for abdominal pain and blood in stool.  Skin: Negative.     Allergies  Review of patient's allergies indicates no known allergies.  Home Medications   Current Outpatient Rx  Name  Route  Sig  Dispense  Refill  . albuterol (VENTOLIN HFA) 108 (90 BASE) MCG/ACT inhaler   Inhalation   Inhale 2 puffs into the lungs every 6 (six) hours as needed.           Marland Kitchen. aspirin EC 325 MG tablet   Oral   Take 325 mg by mouth daily.         Marland Kitchen. atorvastatin (LIPITOR) 20 MG tablet      TAKE ONE TABLET BY MOUTH AT BEDTIME   100 tablet   3   . diltiazem (TIAZAC) 240 MG 24 hr capsule   Oral   Take 1 capsule (240 mg total) by mouth daily.   100 capsule   3   . furosemide (LASIX) 40 MG tablet      TAKE TWO TABLETS BY MOUTH IN THE MORNING   180 tablet   3     Office visit for more refills   . glipiZIDE (GLUCOTROL) 5 MG tablet   Oral   Take 1 tablet (5 mg total) by mouth 2 (  two) times daily before a meal.   200 tablet   3   . glucose blood test strip   Other   1 each by Other route. Use as instructed          . lisinopril (PRINIVIL,ZESTRIL) 40 MG tablet   Oral   Take 1 tablet (40 mg total) by mouth daily.   90 tablet   3   . metFORMIN (GLUCOPHAGE) 1000 MG tablet   Oral   Take 1 tablet (1,000 mg total) by mouth 2 (two) times daily with a meal.   200 tablet   3     HOLD for 2 days. Resume on 05/06/2011.   . ondansetron (ZOFRAN) 4 MG tablet   Oral   Take 1 tablet (4 mg total) by mouth every 6 (six) hours.   8 tablet   1   . potassium chloride SA (KLOR-CON M20) 20 MEQ tablet      TAKE TWO TABLETS BY MOUTH IN THE MORNING   200 tablet   3   . Tadalafil 2.5 MG TABS   Oral   Take 1 tablet (2.5 mg total) by mouth daily.   30 each   3    BP 170/96  Pulse 76  Temp(Src) 98.2 F (36.8 C) (Oral)  Resp 24  SpO2 95% Physical Exam  Nursing note and vitals reviewed. Constitutional: He is oriented to person, place, and time. He  appears well-developed and well-nourished.  HENT:  Head: Normocephalic.  Right Ear: External ear normal.  Left Ear: External ear normal.  Mouth/Throat: Oropharynx is clear and moist.  Neck: Normal range of motion. Neck supple.  Cardiovascular: Normal heart sounds.   Pulmonary/Chest: Breath sounds normal.  Abdominal: Soft. Bowel sounds are normal. He exhibits no distension and no mass. There is no tenderness. There is no rebound and no guarding.  Lymphadenopathy:    He has no cervical adenopathy.  Neurological: He is alert and oriented to person, place, and time.  Skin: Skin is warm and dry.    ED Course  Procedures (including critical care time) Labs Review Labs Reviewed - No data to display Imaging Review No results found.   MDM   1. Gastroenteritis, acute        Linna Hoff, MD 06/11/13 2007

## 2013-06-11 NOTE — ED Notes (Signed)
Diarrhea vomiting since Monday; MD eval only

## 2013-06-11 NOTE — Discharge Instructions (Signed)
Clear liquid diet tonight as tolerated, advance on thurs as improved, use medicine as needed, imodium for diarrhea, return or see your doctor if any problems.

## 2013-07-03 ENCOUNTER — Other Ambulatory Visit: Payer: Self-pay | Admitting: Family Medicine

## 2013-07-16 ENCOUNTER — Ambulatory Visit (INDEPENDENT_AMBULATORY_CARE_PROVIDER_SITE_OTHER): Payer: 59 | Admitting: Family Medicine

## 2013-07-16 ENCOUNTER — Encounter: Payer: Self-pay | Admitting: Family Medicine

## 2013-07-16 VITALS — BP 130/90 | Temp 98.1°F | Wt 399.0 lb

## 2013-07-16 DIAGNOSIS — M79672 Pain in left foot: Principal | ICD-10-CM

## 2013-07-16 DIAGNOSIS — M79671 Pain in right foot: Secondary | ICD-10-CM

## 2013-07-16 DIAGNOSIS — M79609 Pain in unspecified limb: Secondary | ICD-10-CM

## 2013-07-16 LAB — MICROALBUMIN / CREATININE URINE RATIO
Creatinine,U: 51.1 mg/dL
Microalb Creat Ratio: 0.4 mg/g (ref 0.0–30.0)
Microalb, Ur: 0.2 mg/dL (ref 0.0–1.9)

## 2013-07-16 LAB — BASIC METABOLIC PANEL
BUN: 15 mg/dL (ref 6–23)
CO2: 27 mEq/L (ref 19–32)
Calcium: 9.5 mg/dL (ref 8.4–10.5)
Chloride: 103 mEq/L (ref 96–112)
Creatinine, Ser: 0.9 mg/dL (ref 0.4–1.5)
GFR: 121.25 mL/min (ref >60.00)
Glucose, Bld: 86 mg/dL (ref 70–99)
Potassium: 3.9 mEq/L (ref 3.5–5.1)
Sodium: 138 mEq/L (ref 135–145)

## 2013-07-16 LAB — HEMOGLOBIN A1C: Hgb A1c MFr Bld: 7.1 % — ABNORMAL HIGH (ref 4.6–6.5)

## 2013-07-16 LAB — URIC ACID: Uric Acid, Serum: 7.8 mg/dL (ref 4.0–7.8)

## 2013-07-16 MED ORDER — INDOMETHACIN 25 MG PO CAPS: ORAL_CAPSULE | ORAL | Status: DC

## 2013-07-16 NOTE — Progress Notes (Signed)
Pre visit review using our clinic review tool, if applicable. No additional management support is needed unless otherwise documented below in the visit note. 

## 2013-07-16 NOTE — Patient Instructions (Signed)
Labs today  Did not take any over-the-counter medications  Indomethacin 25 mg............. one twice daily when necessary for foot pain

## 2013-07-16 NOTE — Progress Notes (Signed)
   Subjective:    Patient ID: Larena Glassmanatrick D Deniston, male    DOB: [birth year], 48 y.o.   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 No history of trauma. No history of gout although he has underlying diabetic and has hypertension and hyperlipidemia  His last blood sugar check was over a year ago hemoglobin A1c then was 7.8%  He does not check his blood sugar on a regular basis  Family history negative for gout  His weight went up to 450 pounds. He joined a gym and changed his diet. Again he has not been monitoring his blood sugar   Review of Systems    review of systems otherwise negative no history of trauma Objective:   Physical Exam  Well-developed and nourished male no acute distress vital signs stable he is afebrile weight is up to 399 pounds examination of feet shows tenderness of both proximal joints no redness or swelling      Assessment & Plan:  Bilateral toe pain rule out gout check labs begin low-dose indomethacin  Obesity improved with diet and exercise  Diabetes type 2,,,,,,, check labs

## 2013-08-05 ENCOUNTER — Other Ambulatory Visit: Payer: Self-pay | Admitting: Family Medicine

## 2013-09-29 ENCOUNTER — Telehealth: Payer: Self-pay | Admitting: *Deleted

## 2013-09-29 DIAGNOSIS — E785 Hyperlipidemia, unspecified: Secondary | ICD-10-CM

## 2013-09-29 DIAGNOSIS — E119 Type 2 diabetes mellitus without complications: Secondary | ICD-10-CM

## 2013-09-29 NOTE — Telephone Encounter (Signed)
Left message on machine for patient to schedule a lab appointment Lipid ordered Message will be sent to mychart Diabetic bundle

## 2013-10-01 ENCOUNTER — Telehealth: Payer: Self-pay | Admitting: Family Medicine

## 2013-10-01 MED ORDER — DILTIAZEM HCL ER BEADS 240 MG PO CP24: ORAL_CAPSULE | ORAL | Status: DC

## 2013-10-01 NOTE — Telephone Encounter (Signed)
[id number] - [place], [place] - [address] Is requesting re-fill on TAZTIA XT 240 MG 24 hr capsule

## 2013-11-15 ENCOUNTER — Other Ambulatory Visit: Payer: Self-pay | Admitting: Family Medicine

## 2013-11-18 ENCOUNTER — Other Ambulatory Visit: Payer: Self-pay | Admitting: Family Medicine

## 2013-11-18 MED ORDER — INDOMETHACIN 25 MG PO CAPS: ORAL_CAPSULE | ORAL | Status: DC

## 2013-11-18 MED ORDER — ATORVASTATIN CALCIUM 20 MG PO TABS: ORAL_TABLET | ORAL | Status: DC

## 2013-11-18 MED ORDER — FUROSEMIDE 40 MG PO TABS: ORAL_TABLET | ORAL | Status: DC

## 2014-01-05 ENCOUNTER — Other Ambulatory Visit: Payer: Self-pay | Admitting: Family Medicine

## 2014-02-07 ENCOUNTER — Observation Stay: Payer: Self-pay | Admitting: Internal Medicine

## 2014-02-07 LAB — BASIC METABOLIC PANEL
Anion Gap: 9 (ref 7–16)
BUN: 15 mg/dL (ref 7–18)
Calcium, Total: 9 mg/dL (ref 8.5–10.1)
Chloride: 105 mmol/L (ref 98–107)
Co2: 26 mmol/L (ref 21–32)
Creatinine: 0.82 mg/dL (ref 0.60–1.30)
EGFR (African American): >60
EGFR (Non-African Amer.): >60
Glucose: 99 mg/dL (ref 65–99)
Osmolality: 280 (ref 275–301)
Potassium: 3.6 mmol/L (ref 3.5–5.1)
Sodium: 140 mmol/L (ref 136–145)

## 2014-02-07 LAB — CBC
HCT: 43.4 % (ref 40.0–52.0)
HGB: 13.8 g/dL (ref 13.0–18.0)
MCH: 26.8 pg (ref 26.0–34.0)
MCHC: 31.9 g/dL — ABNORMAL LOW (ref 32.0–36.0)
MCV: 84 fL (ref 80–100)
Platelet: 285 10*3/uL (ref 150–440)
RBC: 5.17 10*6/uL (ref 4.40–5.90)
RDW: 14.8 % — ABNORMAL HIGH (ref 11.5–14.5)
WBC: 4.8 10*3/uL (ref 3.8–10.6)

## 2014-02-07 LAB — TROPONIN I
Troponin-I: <0.02 ng/mL
Troponin-I: <0.02 ng/mL
Troponin-I: <0.02 ng/mL

## 2014-02-07 LAB — CK-MB
CK-MB: 2 ng/mL (ref 0.5–3.6)
CK-MB: 1.8 ng/mL (ref 0.5–3.6)
CK-MB: 1.2 ng/mL (ref 0.5–3.6)

## 2014-02-08 LAB — LIPID PANEL
Cholesterol: 177 mg/dL (ref 0–200)
HDL Cholesterol: 30 mg/dL — ABNORMAL LOW (ref 40–60)
Ldl Cholesterol, Calc: 118 mg/dL — ABNORMAL HIGH (ref 0–100)
Triglycerides: 147 mg/dL (ref 0–200)
VLDL Cholesterol, Calc: 29 mg/dL (ref 5–40)

## 2014-02-08 LAB — HEMOGLOBIN A1C: Hemoglobin A1C: 6.7 % — ABNORMAL HIGH (ref 4.2–6.3)

## 2014-02-08 LAB — TSH: Thyroid Stimulating Horm: 1.91 u[IU]/mL

## 2014-02-09 ENCOUNTER — Encounter: Payer: Self-pay | Admitting: Family Medicine

## 2014-02-09 ENCOUNTER — Telehealth: Payer: Self-pay | Admitting: Family Medicine

## 2014-02-09 DIAGNOSIS — K219 Gastro-esophageal reflux disease without esophagitis: Secondary | ICD-10-CM

## 2014-02-09 DIAGNOSIS — E669 Obesity, unspecified: Secondary | ICD-10-CM

## 2014-02-09 DIAGNOSIS — I10 Essential (primary) hypertension: Secondary | ICD-10-CM

## 2014-02-09 DIAGNOSIS — E785 Hyperlipidemia, unspecified: Secondary | ICD-10-CM

## 2014-02-09 NOTE — Telephone Encounter (Signed)
Pt went to [facility] over the weekend for chest pains. Needs hosp fup. Pt needs am appt due to work. Will try to get off for the 3:45 tomorrow, but doubtful. pls advise on when to sche.

## 2014-02-10 NOTE — Telephone Encounter (Signed)
Patient should go to cardiology first per Dr Tawanna Coolerodd. Message sent to patient via mychart.  Referral placed.

## 2014-02-15 ENCOUNTER — Other Ambulatory Visit: Payer: Self-pay | Admitting: Family Medicine

## 2014-02-16 ENCOUNTER — Other Ambulatory Visit: Payer: Self-pay | Admitting: Family Medicine

## 2014-02-17 ENCOUNTER — Other Ambulatory Visit: Payer: Self-pay | Admitting: Family Medicine

## 2014-02-17 MED ORDER — DILTIAZEM HCL ER BEADS 240 MG PO CP24: 240.0000 mg | ORAL_CAPSULE | Freq: Every day | ORAL | Status: DC

## 2014-02-17 MED ORDER — FUROSEMIDE 40 MG PO TABS: ORAL_TABLET | ORAL | Status: DC

## 2014-02-19 ENCOUNTER — Encounter (HOSPITAL_COMMUNITY): Payer: Self-pay | Admitting: Cardiovascular Disease

## 2014-03-18 ENCOUNTER — Ambulatory Visit (INDEPENDENT_AMBULATORY_CARE_PROVIDER_SITE_OTHER): Payer: BLUE CROSS/BLUE SHIELD | Admitting: Cardiology

## 2014-03-18 ENCOUNTER — Encounter: Payer: Self-pay | Admitting: Cardiology

## 2014-03-18 VITALS — BP 130/60 | HR 70 | Ht 74.0 in | Wt 387.0 lb

## 2014-03-18 DIAGNOSIS — R079 Chest pain, unspecified: Secondary | ICD-10-CM

## 2014-03-18 DIAGNOSIS — E119 Type 2 diabetes mellitus without complications: Secondary | ICD-10-CM

## 2014-03-18 DIAGNOSIS — E785 Hyperlipidemia, unspecified: Secondary | ICD-10-CM

## 2014-03-18 DIAGNOSIS — I10 Essential (primary) hypertension: Secondary | ICD-10-CM

## 2014-03-18 DIAGNOSIS — I2510 Atherosclerotic heart disease of native coronary artery without angina pectoris: Secondary | ICD-10-CM

## 2014-03-18 DIAGNOSIS — R011 Cardiac murmur, unspecified: Secondary | ICD-10-CM

## 2014-03-18 DIAGNOSIS — I2583 Coronary atherosclerosis due to lipid rich plaque: Secondary | ICD-10-CM

## 2014-03-18 HISTORY — DX: Chest pain, unspecified: R07.9

## 2014-03-18 NOTE — Progress Notes (Signed)
[address] [place], [place]  [address] Phone: [phone] Fax:  [phone]  Date:  03/18/2014   ID:  Raymond Lutz, DOB [birth year], MRN [id number]  PCP:  Evette GeorgesDD,JEFFREY ALLEN, MD  Cardiologist:  Armanda Magicraci Cristobal Advani, MD    History of Present Illness: Raymond Lutz is a 49 y.o. male with a history of type II DM, dyslipidemia, HTN, PVD, PAF, nonobstructive ASCAD with 50% OM, 20-30% dLAD, 30-40%dRCA at cath 2013 and GERD who presents today for evaluation of chest pain.  He says that he had an episode of chest pain last month while driving to work and felt like a pressure sitting on his chest.  He became diaphoretic but denied any nausea.  He became SOB as well.  He went to [facility] and was admitted and rule out for MI.  He was sent home and followed up with his PCP and is now referred to Cardiology.  He says that the pain radiated into his left arm and lasted about 1 hour.  It eventually subsided on its own.  He has not had CP since then.  He denies any DOE, palpitations, dizziness or syncope.  He occasionally has LE edema but is on his feet all day.     Wt Readings from Last 3 Encounters:  03/18/14 387 lb (175.542 kg)  07/16/13 399 lb (180.985 kg)  07/25/12 406 lb (184.16 kg)     Past Medical History  Diagnosis Date  . DIABETES MELLITUS, TYPE II 10/17/2006  . HYPERLIPIDEMIA 10/17/2006  . OBESITY 10/17/2006  . HYPERTENSION 10/17/2006  . Asthma   . Chest pain     a. 05/2001 - Cardiolite: EF 47%, Diaphragmatic Attenuation.  Marland Kitchen. GERD (gastroesophageal reflux disease)   . Paroxysmal atrial fibrillation     2006 - was on coumadin - took himself off 6 mos after initiation.  . Shortness of breath   . Sleep apnea     not using CPAP regularly  . ED (erectile dysfunction)   . Arthritis   . Coronary artery disease      nonobstructive ASCAD with 50% OM, 20-30% dLAD, 30-40%dRCA at cath 2013     Current Outpatient Prescriptions  Medication Sig Dispense Refill  . aspirin EC 325 MG  tablet Take 325 mg by mouth daily.    Marland Kitchen. atorvastatin (LIPITOR) 20 MG tablet TAKE ONE TABLET BY MOUTH AT BEDTIME 100 tablet 3  . diltiazem (TAZTIA XT) 240 MG 24 hr capsule Take 1 capsule (240 mg total) by mouth daily. 100 capsule 3  . furosemide (LASIX) 40 MG tablet TAKE TWO TABLETS BY MOUTH ONCE DAILY IN THE MORNING. 180 tablet 3  . glipiZIDE (GLUCOTROL) 5 MG tablet TAKE ONE TABLET BY MOUTH TWICE DAILY BEFORE A MEAL. 200 tablet 0  . glucose blood test strip 1 each by Other route. Use as instructed     . KLOR-CON M20 20 MEQ tablet TAKE TWO TABLETS BY MOUTH IN THE MORNING 200 tablet 2  . lisinopril (PRINIVIL,ZESTRIL) 40 MG tablet TAKE ONE TABLET BY MOUTH ONCE DAILY 90 tablet 2  . metFORMIN (GLUCOPHAGE) 1000 MG tablet Take 1 tablet (1,000 mg total) by mouth 2 (two) times daily with a meal. 200 tablet 3  . albuterol (VENTOLIN HFA) 108 (90 BASE) MCG/ACT inhaler Inhale 2 puffs into the lungs every 6 (six) hours as needed.      . indomethacin (INDOCIN) 25 MG capsule TAKE ONE CAPSULE BY MOUTH TWICE DAILY WITH FOOD FOR GOUT (Patient not  taking: Reported on 03/18/2014) 50 capsule 3   No current facility-administered medications for this visit.    Allergies:   No Known Allergies  Social History:  The patient  reports that he has never smoked. He has never used smokeless tobacco. He reports that he does not drink alcohol or use illicit drugs.   Family History:  The patient's family history includes Cerebral aneurysm in his mother; Diabetes in his maternal grandmother; Heart attack in his brother and sister; Hypertension in his brother; Lung cancer in his father.   ROS:  Please see the history of present illness.      All other systems reviewed and negative.   PHYSICAL EXAM: VS:  BP 130/60 mmHg  Pulse 70  Ht  (1.88 m)  Wt 387 lb (175.542 kg)  BMI 49.67 kg/m2 Well nourished, well developed, in no acute distress HEENT: normal Neck: no JVD Cardiac:  normal S1, S2; RRR; 2/6 systolic murmur at LLSB  and apex Lungs:  clear to auscultation bilaterally, no wheezing, rhonchi or rales Abd: soft, nontender, no hepatomegaly Ext: no edema Skin: warm and dry Neuro:  CNs 2-12 intact, no focal abnormalities noted  EKG:  NSR with no ST changes     ASSESSMENT AND PLAN:  1. Chest pain worrisome for coronary ischemia.  He has known nonobstructive ASCAD by cath in 2013.  EKG is nonischemic.  He cannot walk due to torn cartilage in his knee so I will get a 2 day Lexiscan myoview to rule out ischemia. 2. Nonobstructive ASCAD by cath 2013 - continue ASA/statin 3. Heart Murmur - I will get a 2D echo 4. Type II DM - per PCP.  Continue metformin 5. HTN - well controlled.  Continue diltizem/Lisinopril 6.   Dyslipidemia - continue statin 7.   PAF with no reoccurrence - continue ASA/Diltiazem ( had been on coumadin and took himself off)  His afib was very remote.  If he has a reoccurrence I have recommended to him that he resume long term anticoagulation.  Followup with me in 6 months  Signed, Armanda Magic, MD [facility] 03/18/2014 9:50 AM

## 2014-03-18 NOTE — Patient Instructions (Addendum)
Your physician has requested that you have a 2 DAY lexiscan myoview. For further information please visit [email or web address]. Please follow instruction sheet, as given.  Your physician has requested that you have an echocardiogram. Echocardiography is a painless test that uses sound waves to create images of your heart. It provides your doctor with information about the size and shape of your heart and how well your heart's chambers and valves are working. This procedure takes approximately one hour. There are no restrictions for this procedure.  Your physician recommends that you continue on your current medications as directed. Please refer to the Current Medication list given to you today.  Your physician wants you to follow-up in: 6 months with Dr. Mayford Knifeurner. You will receive a reminder letter in the mail two months in advance. If you don't receive a letter, please call our office to schedule the follow-up appointment.

## 2014-03-25 ENCOUNTER — Ambulatory Visit (HOSPITAL_COMMUNITY): Payer: BLUE CROSS/BLUE SHIELD | Attending: Cardiovascular Disease | Admitting: Cardiology

## 2014-03-25 ENCOUNTER — Ambulatory Visit (HOSPITAL_BASED_OUTPATIENT_CLINIC_OR_DEPARTMENT_OTHER): Payer: BLUE CROSS/BLUE SHIELD | Admitting: Radiology

## 2014-03-25 DIAGNOSIS — R079 Chest pain, unspecified: Secondary | ICD-10-CM | POA: Diagnosis not present

## 2014-03-25 DIAGNOSIS — R011 Cardiac murmur, unspecified: Secondary | ICD-10-CM

## 2014-03-25 DIAGNOSIS — R0602 Shortness of breath: Secondary | ICD-10-CM | POA: Diagnosis not present

## 2014-03-25 DIAGNOSIS — I10 Essential (primary) hypertension: Secondary | ICD-10-CM | POA: Diagnosis not present

## 2014-03-25 DIAGNOSIS — I2510 Atherosclerotic heart disease of native coronary artery without angina pectoris: Secondary | ICD-10-CM

## 2014-03-25 DIAGNOSIS — I4891 Unspecified atrial fibrillation: Secondary | ICD-10-CM | POA: Insufficient documentation

## 2014-03-25 MED ORDER — TECHNETIUM TC 99M SESTAMIBI GENERIC - CARDIOLITE
30.0000 | Freq: Once | INTRAVENOUS | Status: AC | PRN
  Administered 2014-03-25: 30 via INTRAVENOUS

## 2014-03-25 MED ORDER — REGADENOSON 0.4 MG/5ML IV SOLN
0.4000 mg | Freq: Once | INTRAVENOUS | Status: AC
  Administered 2014-03-25: 0.4 mg via INTRAVENOUS

## 2014-03-25 NOTE — Progress Notes (Signed)
Echo performed. 

## 2014-03-25 NOTE — Progress Notes (Signed)
  [facility] SITE 3 NUCLEAR MED [address] [place], [place] [address] [phone]    Cardiology Nuclear Med Study  Raymond Glassmanatrick D Klute is a 49 y.o. male     MRN : [id number]     DOB: October 07, 1965  Procedure Date: 03/25/2014  Nuclear Med Background Indication for Stress Test:  Evaluation for Ischemia History:  Asthma and CAD-'03 MPI EF: 47% NL EAGLE, H/O AFIB, PVD Cardiac Risk Factors: Hypertension and Atrial Fib  Symptoms:  Chest Pain and SOB   Nuclear Pre-Procedure Caffeine/Decaff Intake:  None NPO After: 7:00pm   Lungs:  clear O2 Sat: 98% on room air. IV 0.9% NS with Angio Cath:  22g  IV Site: R Hand  IV Started by:  Cathlyn Parsonsynthia Hasspacher, RN  Chest Size (in):  56 Cup Size: n/a  Height: 6\' 1"  (1.854 m)  Weight:  387 lb (175.542 kg)  BMI:  Body mass index is 51.07 kg/(m^2). Tech Comments:  n/a    Nuclear Med Study 1 or 2 day study: 2 day  Stress Test Type:  Lexiscan  Reading MD: n/a  Order Authorizing Provider:  Gevena Cottonraci Turner,MD  Resting Radionuclide: Technetium 6141m Sestamibi  Resting Radionuclide Dose: 33.0 mCi on 03/26/14   Stress Radionuclide:  Technetium 6941m Sestamibi  Stress Radionuclide Dose: 33.0 mCi on 03/25/14           Stress Protocol Rest HR: 70 Stress HR: 82  Rest BP: 133/60 Stress BP: 134/41  Exercise Time (min): n/a METS: n/a   Predicted Max HR: 173 bpm % Max HR: 47.4 bpm Rate Pressure Product: [id number]   Dose of Adenosine (mg):  n/a Dose of Lexiscan: 0.4 mg  Dose of Atropine (mg): n/a Dose of Dobutamine: n/a mcg/kg/min (at max HR)  Stress Test Technologist: Milana NaSabrina Williams, EMT-P  Nuclear Technologist:  Kerby NoraElzbieta Kubak, CNMT     Rest Procedure:  Myocardial perfusion imaging was performed at rest 45 minutes following the intravenous administration of Technetium 6341m Sestamibi. Rest ECG: NSR - Normal EKG  Stress Procedure:  The patient received IV Lexiscan 0.4 mg over 15-seconds.  Technetium 5841m Sestamibi injected at 30-seconds.This patient had  pressure in his head and his chest with the Lexiscan injection.Quantitative spect images were obtained after a 45 minute delay. Stress ECG: No significant change from baseline ECG  QPS Raw Data Images:  Mild diaphragmatic attenuation.  Normal left ventricular size. Stress Images:  Normal homogeneous uptake in all areas of the myocardium. Rest Images:  Normal homogeneous uptake in all areas of the myocardium. Subtraction (SDS):  No evidence of ischemia. Transient Ischemic Dilatation (Normal <1.22):  0.95 Lung/Heart Ratio (Normal <0.45):  0.30  Quantitative Gated Spect Images QGS EDV:  234 ml QGS ESV:  126 ml  Impression Exercise Capacity:  Lexiscan with no exercise. BP Response:  Normal blood pressure response. Clinical Symptoms:  Mild chest pressure. ECG Impression:  No significant ST segment change suggestive of ischemia. Comparison with Prior Nuclear Study: No images to compare  Overall Impression:  Low risk stress nuclear study with mildly decreased overall LVEF. No prior infarct or ischemia. .  LV Ejection Fraction: 46%.  LV Wall Motion:  Mild diffuse hypokinesis.  This is unchanged from the prior study in 2003.   Lars MassonELSON, Anglea Gordner H 03/26/2014

## 2014-03-26 ENCOUNTER — Ambulatory Visit (HOSPITAL_COMMUNITY): Payer: BLUE CROSS/BLUE SHIELD | Attending: Family Medicine

## 2014-03-26 DIAGNOSIS — R0989 Other specified symptoms and signs involving the circulatory and respiratory systems: Secondary | ICD-10-CM

## 2014-03-26 MED ORDER — TECHNETIUM TC 99M SESTAMIBI GENERIC - CARDIOLITE
33.0000 | Freq: Once | INTRAVENOUS | Status: AC | PRN
  Administered 2014-03-26: 33 via INTRAVENOUS

## 2014-03-27 ENCOUNTER — Telehealth: Payer: Self-pay | Admitting: Cardiology

## 2014-03-27 NOTE — Telephone Encounter (Signed)
New Message  Pt returning [name]'s phone call; please call back and discuss.

## 2014-03-27 NOTE — Telephone Encounter (Signed)
New Msg         Pt returning call from today please call.

## 2014-03-27 NOTE — Telephone Encounter (Signed)
Left message for patient to check MyChart and call back if he has any further questions.

## 2014-03-27 NOTE — Telephone Encounter (Signed)
Left message on patient's VM to check results on MyChart and call with any questions he may have.

## 2014-05-26 ENCOUNTER — Other Ambulatory Visit: Payer: Self-pay | Admitting: Family Medicine

## 2014-07-01 ENCOUNTER — Encounter: Payer: Self-pay | Admitting: Cardiology

## 2014-07-04 NOTE — H&P (Signed)
PATIENT NAME:  Raymond Lutz, Raymond Lutz MR#:  [id number] DATE OF BIRTH:  1965-08-20  DATE OF ADMISSION:  02/07/2014  PRIMARY CARE PHYSICIAN: None.   REFERRING PHYSICIAN: Reita May, physician assistant in the Emergency Department.   CHIEF COMPLAINT: Chest pain.   HISTORY OF PRESENT ILLNESS: Raymond Lutz is a 49 year old morbidly obese, African American male with a history of hypertension, diabetes mellitus, who comes to the Emergency Department with complaints of chest pain on the right side of the chest. Started around 10 in the morning. The patient was sitting and watching TV started to experience chest pain in the right side of the chest, 10/10 in intensity associated with mild shortness of breath. The pain that resolved by itself about an hour later. Concerning this he came to the Emergency Department. Workup in the Emergency Department with EKG and cardiac enzymes was unremarkable. The patient states had similar pain in the past about 2 years back that required left heart catheterization which showed nonobstructive coronary artery disease. The patient at baseline walks at his work without having any chest pain or palpitations.   PAST MEDICAL HISTORY:  1. Hypertension.  2. Hyperlipidemia.  3. Diabetes mellitus.  4. Atrial fibrillation.   PAST SURGICAL HISTORY: None.   ALLERGIES: No known drug allergies.   HOME MEDICATIONS: None.   SOCIAL HISTORY: No history of smoking, drinking alcohol, or using illicit drugs. Lives with his wife.    FAMILY HISTORY: Brother and sister died in the 73s and 73s with acute coronary syndrome.   REVIEW OF SYSTEMS:  CONSTITUTIONAL: Experiencing generalized weakness.  EYES: No change in vision.  ENT: No change in hearing.  RESPIRATORY: No cough, shortness of breath.  CARDIOVASCULAR: No chest pain, palpations.  GASTROINTESTINAL: No nausea, vomiting, abdominal pain.  GENITOURINARY: No dysuria or hematuria.  HEMATOLOGIC: No easy bruising or bleeding.  SKIN: No  rash or lesions.  MUSCULOSKELETAL: No joint pains and aches.  NEUROLOGIC: No weakness or numbness in any part of the body.   PHYSICAL EXAMINATION:  GENERAL: This is a well-built, well-nourished morbidly obese male lying down in the bed, not in distress.  VITAL SIGNS: Temperature 98.1, pulse 67, blood pressure 160/80, respiratory rate of 13, oxygen saturation is 98% on room air.  HEENT: Head normocephalic, atraumatic. There is no scleral icterus. Conjunctivae are normal. Pupils equal and react to light. Mucous membranes moist. No pharyngeal erythema.  NECK: Supple. No lymphadenopathy. No JVD. No carotid bruit. No thyromegaly.  CHEST: Has no focal tenderness.  LUNGS: Bilaterally clear to auscultation.  HEART: S1, S2 regular. No murmurs are heard.  ABDOMEN: Bowel sounds present. Soft, nontender, nondistended. No hepatosplenomegaly.  EXTREMITIES: No pedal edema. Pulses 2+.  NEUROLOGIC: The patient is alert, oriented to place, person, and time. Cranial nerves II-XII intact. Motor 5/5 in upper and lower extremities.   LABORATORY DATA:  1. Troponins less than 0.002.  2. Chest x-ray PA and lateral no acute cardiopulmonary disease.  3. Urinalysis, CBC, and CMP are completely within normal limits.   ASSESSMENT AND PLAN: Raymond Lutz is a 49 year old morbidly obese male who comes with chest pain.  1. Chest pain: Risk factors of diabetes mellitus, hypertension, already has known history of cardiac disease with nonobstructive disease, morbid obesity  keep the patient at a risk.cycle cardiac enzymes x 3, which came back to be negative.  the patient could be discharged home with follow up with Dr. Daleen Squibb, his cardiologist in [place].  2. Diabetes mellitus: Continue with home medications.  3. Hypertension, currently  well controlled. Continue with home medications.  4. Keep the patient on deep vein thrombosis prophylaxis with Lovenox.   TIME SPENT: 50 minutes.    ____________________________ Susa GriffinsPadmaja  Arista Kettlewell, MD pv:bm D: 02/07/2014 23:42:07 ET T: 02/08/2014 00:10:54 ET JOB#: [id number]  cc: Susa GriffinsPadmaja Nazier Neyhart, MD, <Dictator> Susa GriffinsPADMAJA Taiden Raybourn MD ELECTRONICALLY SIGNED 02/20/2014 21:27

## 2014-07-04 NOTE — Discharge Summary (Signed)
PATIENT NAME:  Raymond Lutz, Raymond Lutz MR#:  [id number] DATE OF BIRTH:  May 18, 1965  DATE OF ADMISSION:  02/07/2014 DATE OF DISCHARGE:  02/08/2014  DISCHARGE DIAGNOSES:  1.  Musculoskeletal right-sided chest pain.  2.  Hypertension.  3.  Diabetes mellitus.   DISCHARGE MEDICATIONS:  1. Lisinopril 40 mg daily.  2. Lasix 40 mg daily.  3. Glipizide 5 mg once a day.  4. Metformin 1000 mg daily.  5. Potassium chloride 20 mEq daily.  6. Taztia XT 240 mg oral once a day.  7. Aspirin 325 mg oral once a day.  8. Atorvastatin 20 mg daily.    DISCHARGE INSTRUCTIONS:  Low fat, carbohydrate controlled diet. Activity as tolerated. [place] in 1[place]2 weeks.   IMAGING STUDIES:  Chest x-ray showed nothing acute, some stable endplate spurring in the lower thoracic spine.   ADMITTING HISTORY AND PHYSICAL AND HOSPITAL COURSE: Please see detailed H and P dictated by Dr. Heron NayVasireddy.  In brief a 49 year old morbidly obese African-American male patient with history of diabetes, hypertension, presented to the hospital complaining of right-sided chest pain. He was admitted to rule out acute coronary syndrome. Had 3 sets of cardiac enzymes, normal. Chest x-ray showed nothing acute. His pain was thought to be likely musculoskeletal, could be radiating pain from his low thoracic spine which showed some changes on the chest x-ray. Nothing acute. No focal neurological deficits. No shortness of breath. Chest pain has resolved and he is being discharged home in a stable condition.   Prior to discharge lungs sound clear. S1, S2 heard without any murmurs. The patient is alert, awake.   TIME SPENT ON DAY OF DISCHARGE IN DISCHARGE ACTIVITY: 35 minutes.     ____________________________ Molinda BailiffSrikar R. Joby Richart, MD srs:bu Lutz: 02/09/2014 16:47:26 ET T: 02/09/2014 18:53:54 ET JOB#: [id number]  cc: Wardell HeathSrikar R. Aysen Shieh, MD, <Dictator> Orie FishermanSRIKAR R Janeice Stegall MD ELECTRONICALLY SIGNED 02/26/2014 13:06

## 2014-08-05 ENCOUNTER — Other Ambulatory Visit: Payer: Self-pay | Admitting: Family Medicine

## 2014-09-04 ENCOUNTER — Encounter: Payer: Self-pay | Admitting: Internal Medicine

## 2014-09-04 ENCOUNTER — Ambulatory Visit (INDEPENDENT_AMBULATORY_CARE_PROVIDER_SITE_OTHER): Payer: BLUE CROSS/BLUE SHIELD | Admitting: Internal Medicine

## 2014-09-04 VITALS — BP 156/80 | HR 73 | Temp 98.2°F | Wt 395.5 lb

## 2014-09-04 DIAGNOSIS — B349 Viral infection, unspecified: Secondary | ICD-10-CM

## 2014-09-04 DIAGNOSIS — B9789 Other viral agents as the cause of diseases classified elsewhere: Secondary | ICD-10-CM

## 2014-09-04 DIAGNOSIS — J988 Other specified respiratory disorders: Principal | ICD-10-CM

## 2014-09-04 MED ORDER — HYDROCODONE-HOMATROPINE 5-1.5 MG/5ML PO SYRP: 5.0000 mL | ORAL_SOLUTION | ORAL | Status: DC | PRN

## 2014-09-04 NOTE — Progress Notes (Signed)
Pre visit review using our clinic review tool, if applicable. No additional management support is needed unless otherwise documented below in the visit note.   Chief Complaint  Patient presents with  . Fever    Ongoing for 2 days  . Cough  . Nasal Congestion    HPI: Patient Raymond Lutz  comes in today for SDA for  new problem evaluation. PCP NA Onset 2 days ago of upper respiratory congestion and now Coughing up green   This am   Earlier   2-3 day s.  Little fever yesterday.  Didn't take med . No fever today no shortness of breath or wheezing or hemoptysis. Denies history of chronic lung disease. Uncertain what to take for his symptoms. ROS: See pertinent positives and negatives per HPI. No chest pain shortness of breath hasn't checked his sugar and a while last week he was 100  Past Medical History  Diagnosis Date  . DIABETES MELLITUS, TYPE II 10/17/2006  . HYPERLIPIDEMIA 10/17/2006  . OBESITY 10/17/2006  . HYPERTENSION 10/17/2006  . Asthma   . Chest pain     a. 05/2001 - Cardiolite: EF 47%, Diaphragmatic Attenuation.  Marland Kitchen GERD (gastroesophageal reflux disease)   . Paroxysmal atrial fibrillation     2006 - was on coumadin - took himself off 6 mos after initiation.  . Shortness of breath   . Sleep apnea     not using CPAP regularly  . ED (erectile dysfunction)   . Arthritis   . Coronary artery disease      nonobstructive ASCAD with 50% OM, 20-30% dLAD, 30-40%dRCA at cath 2013     Family History  Problem Relation Age of Onset  . Diabetes Maternal Grandmother   . Hypertension Brother   . Heart attack Sister     died mid 61's  . Heart attack Brother     died early 85's  . Cerebral aneurysm Mother     died mid 65's  . Lung cancer Father     died @ 80    History   Social History  . Marital Status: Married    Spouse Name: N/A  . Number of Children: N/A  . Years of Education: N/A   Social History Main Topics  . Smoking status: Never Smoker   . Smokeless tobacco:  Never Used  . Alcohol Use: No  . Drug Use: No  . Sexual Activity: Yes   Other Topics Concern  . None   Social History Narrative   Works at [employer].  Lives in [place] with wife - 2 children - 13, 10.    Outpatient Prescriptions Prior to Visit  Medication Sig Dispense Refill  . albuterol (VENTOLIN HFA) 108 (90 BASE) MCG/ACT inhaler Inhale 2 puffs into the lungs every 6 (six) hours as needed.      Marland Kitchen aspirin EC 325 MG tablet Take 325 mg by mouth daily.    Marland Kitchen atorvastatin (LIPITOR) 20 MG tablet TAKE ONE TABLET BY MOUTH AT BEDTIME 100 tablet 3  . diltiazem (TAZTIA XT) 240 MG 24 hr capsule Take 1 capsule (240 mg total) by mouth daily. 100 capsule 3  . furosemide (LASIX) 40 MG tablet TAKE TWO TABLETS BY MOUTH ONCE DAILY IN THE MORNING. 180 tablet 3  . glipiZIDE (GLUCOTROL) 5 MG tablet TAKE ONE TABLET BY MOUTH TWICE DAILY BEFORE A MEAL. 200 tablet 0  . glucose blood test strip 1 each by Other route. Use as instructed     . lisinopril (  PRINIVIL,ZESTRIL) 40 MG tablet TAKE ONE TABLET BY MOUTH ONCE DAILY 90 tablet 0  . metFORMIN (GLUCOPHAGE) 1000 MG tablet Take 1 tablet (1,000 mg total) by mouth 2 (two) times daily with a meal. 200 tablet 3  . potassium chloride SA (K-DUR,KLOR-CON) 20 MEQ tablet TAKE TWO TABLETS BY MOUTH IN THE MORNING 200 tablet 0  . indomethacin (INDOCIN) 25 MG capsule TAKE ONE CAPSULE BY MOUTH TWICE DAILY WITH FOOD FOR GOUT (Patient not taking: Reported on 03/18/2014) 50 capsule 3   No facility-administered medications prior to visit.     EXAM:  BP 156/80 mmHg  Pulse 73  Temp(Src) 98.2 F (36.8 C) (Oral)  Wt 395 lb 8 oz (179.398 kg)  SpO2 98%  Body mass index is 52.19 kg/(m^2). WDWN in NAD  quiet respirations; mouth breathing and very nasally congested  somewhat hoarse. Non toxic . HEENT: Normocephalic ;atraumatic , Eyes;  PERRL, EOMs  Full, lids and conjunctiva clear,,Ears: no deformities, canals nl, TM landmarks normal, Nose: no deformity or discharge but +2  congested;face non- tender Mouth : OP clear without lesion or edema . Neck: Supple without adenopathy or masses or bruits Chest:  Clear to A&P without wheezes rales or rhonchi good air movement CV:  S1-S2 no gallops or murmurs peripheral perfusion is normal Skin :nl perfusion and no acute rashes y  ASSESSMENT AND PLAN:  Discussed the following assessment and plan:  Viral respiratory infection -  sig nasal congestion  comfort measures and expectant managment for alarm features   -Patient advised to return or notify health care team  if symptoms worsen ,persist or new concerns arise.  Patient Instructions  Your chest exam is normal today and no signs of pneumonia. This is most likely a viral respiratory infection that involved her sinuses and your bronchial tubes that should resolve on its own. The cough can last 2-3 weeks but she should feel a lot better next week after the weekend. If you get relapsing symptoms high fever or coughing up blood shortness of breath contact the health care team for recheck. Can take comfort measures. Saline nose spray to help unclog the nose and generic Afrin nose spray every night for 3 nights in a row as needed. Do not use regularly to avoid rebound congestion. Can also try plain Mucinex during the day to help loosen up the cough. Hydrocodone cough medicine at night for suppression see you can sleep.   Upper Respiratory Infection, Adult An upper respiratory infection (URI) is also sometimes known as the common cold. The upper respiratory tract includes the nose, sinuses, throat, trachea, and bronchi. Bronchi are the airways leading to the lungs. Most people improve within 1 week, but symptoms can last up to 2 weeks. A residual cough may last even longer.  CAUSES Many different viruses can infect the tissues lining the upper respiratory tract. The tissues become irritated and inflamed and often become very moist. Mucus production is also common. A cold is  contagious. You can easily spread the virus to others by oral contact. This includes kissing, sharing a glass, coughing, or sneezing. Touching your mouth or nose and then touching a surface, which is then touched by another person, can also spread the virus. SYMPTOMS  Symptoms typically develop 1 to 3 days after you come in contact with a cold virus. Symptoms vary from person to person. They may include:  Runny nose.  Sneezing.  Nasal congestion.  Sinus irritation.  Sore throat.  Loss of voice (laryngitis).  Cough.  Fatigue.  Muscle aches.  Loss of appetite.  Headache.  Low-grade fever. DIAGNOSIS  You might diagnose your own cold based on familiar symptoms, since most people get a cold 2 to 3 times a year. Your caregiver can confirm this based on your exam. Most importantly, your caregiver can check that your symptoms are not due to another disease such as strep throat, sinusitis, pneumonia, asthma, or epiglottitis. Blood tests, throat tests, and X-rays are not necessary to diagnose a common cold, but they may sometimes be helpful in excluding other more serious diseases. Your caregiver will decide if any further tests are required. RISKS AND COMPLICATIONS  You may be at risk for a more severe case of the common cold if you smoke cigarettes, have chronic heart disease (such as heart failure) or lung disease (such as asthma), or if you have a weakened immune system. The very young and very old are also at risk for more serious infections. Bacterial sinusitis, middle ear infections, and bacterial pneumonia can complicate the common cold. The common cold can worsen asthma and chronic obstructive pulmonary disease (COPD). Sometimes, these complications can require emergency medical care and may be life-threatening. PREVENTION  The best way to protect against getting a cold is to practice good hygiene. Avoid oral or hand contact with people with cold symptoms. Wash your hands often if  contact occurs. There is no clear evidence that vitamin C, vitamin E, echinacea, or exercise reduces the chance of developing a cold. However, it is always recommended to get plenty of rest and practice good nutrition. TREATMENT  Treatment is directed at relieving symptoms. There is no cure. Antibiotics are not effective, because the infection is caused by a virus, not by bacteria. Treatment may include:  Increased fluid intake. Sports drinks offer valuable electrolytes, sugars, and fluids.  Breathing heated mist or steam (vaporizer or shower).  Eating chicken soup or other clear broths, and maintaining good nutrition.  Getting plenty of rest.  Using gargles or lozenges for comfort.  Controlling fevers with ibuprofen or acetaminophen as directed by your caregiver.  Increasing usage of your inhaler if you have asthma. Zinc gel and zinc lozenges, taken in the first 24 hours of the common cold, can shorten the duration and lessen the severity of symptoms. Pain medicines may help with fever, muscle aches, and throat pain. A variety of non-prescription medicines are available to treat congestion and runny nose. Your caregiver can make recommendations and may suggest nasal or lung inhalers for other symptoms.  HOME CARE INSTRUCTIONS   Only take over-the-counter or prescription medicines for pain, discomfort, or fever as directed by your caregiver.  Use a warm mist humidifier or inhale steam from a shower to increase air moisture. This may keep secretions moist and make it easier to breathe.  Drink enough water and fluids to keep your urine clear or pale yellow.  Rest as needed.  Return to work when your temperature has returned to normal or as your caregiver advises. You may need to stay home longer to avoid infecting others. You can also use a face mask and careful hand washing to prevent spread of the virus. SEEK MEDICAL CARE IF:   After the first few days, you feel you are getting worse  rather than better.  You need your caregiver's advice about medicines to control symptoms.  You develop chills, worsening shortness of breath, or brown or red sputum. These may be signs of pneumonia.  You develop yellow or brown nasal discharge  or pain in the face, especially when you bend forward. These may be signs of sinusitis.  You develop a fever, swollen neck glands, pain with swallowing, or white areas in the back of your throat. These may be signs of strep throat. SEEK IMMEDIATE MEDICAL CARE IF:   You have a fever.  You develop severe or persistent headache, ear pain, sinus pain, or chest pain.  You develop wheezing, a prolonged cough, cough up blood, or have a change in your usual mucus (if you have chronic lung disease).  You develop sore muscles or a stiff neck. Document Released: 08/23/2000 Document Revised: 05/22/2011 Document Reviewed: 06/04/2013 [facility] Patient Information 2015 [place], [place]. This information is not intended to replace advice given to you by your health care provider. Make sure you discuss any questions you have with your health care provider.      Neta Mends. Panosh M.D.

## 2014-09-04 NOTE — Patient Instructions (Signed)
Your chest exam is normal today and no signs of pneumonia. This is most likely a viral respiratory infection that involved her sinuses and your bronchial tubes that should resolve on its own. The cough can last 2-3 weeks but she should feel a lot better next week after the weekend. If you get relapsing symptoms high fever or coughing up blood shortness of breath contact the health care team for recheck. Can take comfort measures. Saline nose spray to help unclog the nose and generic Afrin nose spray every night for 3 nights in a row as needed. Do not use regularly to avoid rebound congestion. Can also try plain Mucinex during the day to help loosen up the cough. Hydrocodone cough medicine at night for suppression see you can sleep.   Upper Respiratory Infection, Adult An upper respiratory infection (URI) is also sometimes known as the common cold. The upper respiratory tract includes the nose, sinuses, throat, trachea, and bronchi. Bronchi are the airways leading to the lungs. Most people improve within 1 week, but symptoms can last up to 2 weeks. A residual cough may last even longer.  CAUSES Many different viruses can infect the tissues lining the upper respiratory tract. The tissues become irritated and inflamed and often become very moist. Mucus production is also common. A cold is contagious. You can easily spread the virus to others by oral contact. This includes kissing, sharing a glass, coughing, or sneezing. Touching your mouth or nose and then touching a surface, which is then touched by another person, can also spread the virus. SYMPTOMS  Symptoms typically develop 1 to 3 days after you come in contact with a cold virus. Symptoms vary from person to person. They may include:  Runny nose.  Sneezing.  Nasal congestion.  Sinus irritation.  Sore throat.  Loss of voice (laryngitis).  Cough.  Fatigue.  Muscle aches.  Loss of appetite.  Headache.  Low-grade fever. DIAGNOSIS    You might diagnose your own cold based on familiar symptoms, since most people get a cold 2 to 3 times a year. Your caregiver can confirm this based on your exam. Most importantly, your caregiver can check that your symptoms are not due to another disease such as strep throat, sinusitis, pneumonia, asthma, or epiglottitis. Blood tests, throat tests, and X-rays are not necessary to diagnose a common cold, but they may sometimes be helpful in excluding other more serious diseases. Your caregiver will decide if any further tests are required. RISKS AND COMPLICATIONS  You may be at risk for a more severe case of the common cold if you smoke cigarettes, have chronic heart disease (such as heart failure) or lung disease (such as asthma), or if you have a weakened immune system. The very young and very old are also at risk for more serious infections. Bacterial sinusitis, middle ear infections, and bacterial pneumonia can complicate the common cold. The common cold can worsen asthma and chronic obstructive pulmonary disease (COPD). Sometimes, these complications can require emergency medical care and may be life-threatening. PREVENTION  The best way to protect against getting a cold is to practice good hygiene. Avoid oral or hand contact with people with cold symptoms. Wash your hands often if contact occurs. There is no clear evidence that vitamin C, vitamin E, echinacea, or exercise reduces the chance of developing a cold. However, it is always recommended to get plenty of rest and practice good nutrition. TREATMENT  Treatment is directed at relieving symptoms. There is no cure. Antibiotics  are not effective, because the infection is caused by a virus, not by bacteria. Treatment may include:  Increased fluid intake. Sports drinks offer valuable electrolytes, sugars, and fluids.  Breathing heated mist or steam (vaporizer or shower).  Eating chicken soup or other clear broths, and maintaining good  nutrition.  Getting plenty of rest.  Using gargles or lozenges for comfort.  Controlling fevers with ibuprofen or acetaminophen as directed by your caregiver.  Increasing usage of your inhaler if you have asthma. Zinc gel and zinc lozenges, taken in the first 24 hours of the common cold, can shorten the duration and lessen the severity of symptoms. Pain medicines may help with fever, muscle aches, and throat pain. A variety of non-prescription medicines are available to treat congestion and runny nose. Your caregiver can make recommendations and may suggest nasal or lung inhalers for other symptoms.  HOME CARE INSTRUCTIONS   Only take over-the-counter or prescription medicines for pain, discomfort, or fever as directed by your caregiver.  Use a warm mist humidifier or inhale steam from a shower to increase air moisture. This may keep secretions moist and make it easier to breathe.  Drink enough water and fluids to keep your urine clear or pale yellow.  Rest as needed.  Return to work when your temperature has returned to normal or as your caregiver advises. You may need to stay home longer to avoid infecting others. You can also use a face mask and careful hand washing to prevent spread of the virus. SEEK MEDICAL CARE IF:   After the first few days, you feel you are getting worse rather than better.  You need your caregiver's advice about medicines to control symptoms.  You develop chills, worsening shortness of breath, or brown or red sputum. These may be signs of pneumonia.  You develop yellow or brown nasal discharge or pain in the face, especially when you bend forward. These may be signs of sinusitis.  You develop a fever, swollen neck glands, pain with swallowing, or white areas in the back of your throat. These may be signs of strep throat. SEEK IMMEDIATE MEDICAL CARE IF:   You have a fever.  You develop severe or persistent headache, ear pain, sinus pain, or chest  pain.  You develop wheezing, a prolonged cough, cough up blood, or have a change in your usual mucus (if you have chronic lung disease).  You develop sore muscles or a stiff neck. Document Released: 08/23/2000 Document Revised: 05/22/2011 Document Reviewed: 06/04/2013 [facility] Patient Information 2015 [place], [place]. This information is not intended to replace advice given to you by your health care provider. Make sure you discuss any questions you have with your health care provider.

## 2014-09-07 ENCOUNTER — Other Ambulatory Visit: Payer: Self-pay

## 2014-09-09 ENCOUNTER — Telehealth: Payer: Self-pay | Admitting: Internal Medicine

## 2014-09-09 ENCOUNTER — Ambulatory Visit (INDEPENDENT_AMBULATORY_CARE_PROVIDER_SITE_OTHER)
Admission: RE | Admit: 2014-09-09 | Discharge: 2014-09-09 | Disposition: A | Discharge status: Home / Self Care | Payer: BLUE CROSS/BLUE SHIELD | Source: Ambulatory Visit | Attending: Internal Medicine | Admitting: Internal Medicine

## 2014-09-09 ENCOUNTER — Encounter: Payer: Self-pay | Admitting: Internal Medicine

## 2014-09-09 ENCOUNTER — Encounter: Payer: Self-pay | Admitting: Family Medicine

## 2014-09-09 ENCOUNTER — Ambulatory Visit (INDEPENDENT_AMBULATORY_CARE_PROVIDER_SITE_OTHER): Payer: BLUE CROSS/BLUE SHIELD | Admitting: Internal Medicine

## 2014-09-09 VITALS — BP 140/90 | HR 82 | Temp 98.5°F | Wt 395.0 lb

## 2014-09-09 DIAGNOSIS — J989 Respiratory disorder, unspecified: Secondary | ICD-10-CM

## 2014-09-09 DIAGNOSIS — R509 Fever, unspecified: Principal | ICD-10-CM

## 2014-09-09 MED ORDER — AZITHROMYCIN 250 MG PO TABS: 250.0000 mg | ORAL_TABLET | ORAL | Status: DC

## 2014-09-09 MED ORDER — ALBUTEROL SULFATE HFA 108 (90 BASE) MCG/ACT IN AERS: 2.0000 | INHALATION_SPRAY | Freq: Four times a day (QID) | RESPIRATORY_TRACT | Status: DC | PRN

## 2014-09-09 NOTE — Telephone Encounter (Signed)
Patient stated that he is getting worse he coughing up green stuff. Please advise

## 2014-09-09 NOTE — Progress Notes (Signed)
Pre visit review using our clinic review tool, if applicable. No additional management support is needed unless otherwise documented below in the visit note.   Chief Complaint  Patient presents with  . Cough  . Shortness of Breath  . Wheezing  . Fever    HPI: Patient Raymond Lutz  comes in today for SDA for  worsening problem evaluation. See his last visit. Felt to have a viral respiratory infection however Getting worse   Wheezing temp 99 in am and taking tyneool.   And coughing head is a super congested and he feels terrible body aches. No vomiting. He brings in his large baggy of a large amount of copious thick yellow green phlegm with no blood. No chest pain. He is running out of his albuterol. ROS: See pertinent positives and negatives per HPI.  Past Medical History  Diagnosis Date  . DIABETES MELLITUS, TYPE II 10/17/2006  . HYPERLIPIDEMIA 10/17/2006  . OBESITY 10/17/2006  . HYPERTENSION 10/17/2006  . Asthma   . Chest pain     a. 05/2001 - Cardiolite: EF 47%, Diaphragmatic Attenuation.  Marland Kitchen GERD (gastroesophageal reflux disease)   . Paroxysmal atrial fibrillation     2006 - was on coumadin - took himself off 6 mos after initiation.  . Shortness of breath   . Sleep apnea     not using CPAP regularly  . ED (erectile dysfunction)   . Arthritis   . Coronary artery disease      nonobstructive ASCAD with 50% OM, 20-30% dLAD, 30-40%dRCA at cath 2013     Family History  Problem Relation Age of Onset  . Diabetes Maternal Grandmother   . Hypertension Brother   . Heart attack Sister     died mid 84's  . Heart attack Brother     died early 22's  . Cerebral aneurysm Mother     died mid 71's  . Lung cancer Father     died @ 41    History   Social History  . Marital Status: Married    Spouse Name: N/A  . Number of Children: N/A  . Years of Education: N/A   Social History Main Topics  . Smoking status: Never Smoker   . Smokeless tobacco: Never Used  . Alcohol Use: No  .  Drug Use: No  . Sexual Activity: Yes   Other Topics Concern  . None   Social History Narrative   Works at [employer] engineer.  Lives in [place] with wife - 2 children - 13, 10.    Outpatient Prescriptions Prior to Visit  Medication Sig Dispense Refill  . aspirin EC 325 MG tablet Take 325 mg by mouth daily.    Marland Kitchen atorvastatin (LIPITOR) 20 MG tablet TAKE ONE TABLET BY MOUTH AT BEDTIME 100 tablet 3  . diltiazem (TAZTIA XT) 240 MG 24 hr capsule Take 1 capsule (240 mg total) by mouth daily. 100 capsule 3  . furosemide (LASIX) 40 MG tablet TAKE TWO TABLETS BY MOUTH ONCE DAILY IN THE MORNING. 180 tablet 3  . glipiZIDE (GLUCOTROL) 5 MG tablet TAKE ONE TABLET BY MOUTH TWICE DAILY BEFORE A MEAL. 200 tablet 0  . glucose blood test strip 1 each by Other route. Use as instructed     . HYDROcodone-homatropine (HYCODAN) 5-1.5 MG/5ML syrup Take 5 mLs by mouth every 4 (four) hours as needed for cough. 180 mL 0  . lisinopril (PRINIVIL,ZESTRIL) 40 MG tablet TAKE ONE TABLET BY MOUTH ONCE DAILY 90 tablet 0  .  metFORMIN (GLUCOPHAGE) 1000 MG tablet Take 1 tablet (1,000 mg total) by mouth 2 (two) times daily with a meal. 200 tablet 3  . potassium chloride SA (K-DUR,KLOR-CON) 20 MEQ tablet TAKE TWO TABLETS BY MOUTH IN THE MORNING 200 tablet 0  . albuterol (VENTOLIN HFA) 108 (90 BASE) MCG/ACT inhaler Inhale 2 puffs into the lungs every 6 (six) hours as needed.      . indomethacin (INDOCIN) 25 MG capsule TAKE ONE CAPSULE BY MOUTH TWICE DAILY WITH FOOD FOR GOUT (Patient not taking: Reported on 09/09/2014) 50 capsule 3   No facility-administered medications prior to visit.     EXAM:  BP 140/90 mmHg  Pulse 82  Temp(Src) 98.5 F (36.9 C) (Temporal)  Wt 395 lb (179.171 kg)  SpO2 98%  Body mass index is 52.13 kg/(m^2). Well-developed well-nourished in no acute distress but looks sick nontoxic wearing a mask intermittent deep wheezy cough very nasally congested. Normocephalic TMs intact no acute findings eyes clear  nares plus for obstruction mucus noted no maxillary pain borderline frontal pain. OP no acute findings neck supple without adenopathy chest no acute wheezing but large chest wall. No retractions noted. Question breath sounds on the right are not equal to the left. Color is good skin perfusion normal capillary refill PSYCH: pleasant and cooperative, no obvious depression or anxiety  ASSESSMENT AND PLAN:  Discussed the following assessment and plan:  Febrile respiratory illness - Worsening over the week concern about early pneumonia sinus involvement. Head and body ache get chest x-ray plan for follow-up note for work. - Plan: DG Chest 2 View  Respiratory illness with fever  -Patient advised to return or notify health care team  if symptoms worsen ,persist or new concerns arise.  Patient Instructions  Get x ray concern about early pneumonia  Or at leas a sinus infection. Rest albuterla and antibiotic  FU depending on x ray and how you are doing  Should be a lot better in 5 days   Antibiotic works for 10 days .      Neta MendsWanda K. Johannah Rozas M.D.

## 2014-09-09 NOTE — Patient Instructions (Signed)
Get x ray concern about early pneumonia  Or at leas a sinus infection. Rest albuterla and antibiotic  FU depending on x ray and how you are doing  Should be a lot better in 5 days   Antibiotic works for 10 days .

## 2014-09-09 NOTE — Telephone Encounter (Signed)
Cough is productive of a green mucus.  Started two days ago.  Having dizziness, Nasal congestion, chills, fever and some sob.  Due to much worsening sx, informed pt he needs to be seen.  He has made an appt for today at 4 PM.

## 2014-09-10 ENCOUNTER — Encounter: Payer: Self-pay | Admitting: Internal Medicine

## 2014-09-10 ENCOUNTER — Telehealth: Payer: Self-pay | Admitting: Family Medicine

## 2014-09-10 NOTE — Telephone Encounter (Signed)
Pt saw dr Fabian Sharppanosh yesterday and needs a work note to return to work on 09-12-14. Put will like to pick up tomorrow. Pt is aware md out of office this afternoon

## 2014-09-11 ENCOUNTER — Encounter: Payer: Self-pay | Admitting: Family Medicine

## 2014-09-11 NOTE — Telephone Encounter (Signed)
Pt would like to know if you can put the letter in "MYChart" Pt would like to return to work tomorrow, 7/2.

## 2014-09-11 NOTE — Telephone Encounter (Signed)
Responded to in MyChart through patient's other message.

## 2014-09-11 NOTE — Telephone Encounter (Signed)
Misty please get  Chest x ray results   I dont see them righ tnow. alos get him note for work he canbe excused for medical reasons .

## 2014-09-23 ENCOUNTER — Other Ambulatory Visit: Payer: Self-pay | Admitting: Family Medicine

## 2014-09-24 MED ORDER — LISINOPRIL 40 MG PO TABS: 40.0000 mg | ORAL_TABLET | Freq: Every day | ORAL | Status: DC

## 2014-09-24 NOTE — Addendum Note (Signed)
Addended by: Jimmye NormanPHANOS, Chiquita Heckert J on: 09/24/2014 10:34 AM   Modules accepted: Orders

## 2014-09-28 ENCOUNTER — Other Ambulatory Visit: Payer: Self-pay | Admitting: Family Medicine

## 2014-12-04 ENCOUNTER — Other Ambulatory Visit: Payer: Self-pay | Admitting: Family Medicine

## 2014-12-07 ENCOUNTER — Other Ambulatory Visit: Payer: Self-pay | Admitting: Family Medicine

## 2014-12-09 MED ORDER — GLIPIZIDE 5 MG PO TABS: 5.0000 mg | ORAL_TABLET | Freq: Two times a day (BID) | ORAL | Status: DC

## 2014-12-09 NOTE — Addendum Note (Signed)
Addended by: Kern Reap B on: 12/09/2014 01:37 PM   Modules accepted: Orders

## 2015-01-05 ENCOUNTER — Other Ambulatory Visit: Payer: Self-pay | Admitting: Family Medicine

## 2015-01-06 ENCOUNTER — Ambulatory Visit (INDEPENDENT_AMBULATORY_CARE_PROVIDER_SITE_OTHER): Payer: BLUE CROSS/BLUE SHIELD | Admitting: Family

## 2015-01-06 ENCOUNTER — Encounter: Payer: Self-pay | Admitting: Family

## 2015-01-06 VITALS — BP 160/82 | Temp 98.1°F | Wt 397.0 lb

## 2015-01-06 DIAGNOSIS — R197 Diarrhea, unspecified: Secondary | ICD-10-CM | POA: Diagnosis not present

## 2015-01-06 DIAGNOSIS — IMO0001 Reserved for inherently not codable concepts without codable children: Secondary | ICD-10-CM

## 2015-01-06 DIAGNOSIS — E1165 Type 2 diabetes mellitus with hyperglycemia: Secondary | ICD-10-CM

## 2015-01-06 DIAGNOSIS — R112 Nausea with vomiting, unspecified: Secondary | ICD-10-CM | POA: Diagnosis not present

## 2015-01-06 LAB — BASIC METABOLIC PANEL
BUN: 18 mg/dL (ref 6–23)
CO2: 32 mEq/L (ref 19–32)
Calcium: 10 mg/dL (ref 8.4–10.5)
Chloride: 103 mEq/L (ref 96–112)
Creatinine, Ser: 0.86 mg/dL (ref 0.40–1.50)
GFR: 122.1 mL/min (ref >60.00)
Glucose, Bld: 94 mg/dL (ref 70–99)
Potassium: 4.6 mEq/L (ref 3.5–5.1)
Sodium: 142 mEq/L (ref 135–145)

## 2015-01-06 LAB — CBC WITH DIFFERENTIAL/PLATELET
Basophils Absolute: 0 10*3/uL (ref 0.0–0.1)
Basophils Relative: 0.9 % (ref 0.0–3.0)
Eosinophils Absolute: 0.3 10*3/uL (ref 0.0–0.7)
Eosinophils Relative: 6.2 % — ABNORMAL HIGH (ref 0.0–5.0)
HCT: 41.9 % (ref 39.0–52.0)
Hemoglobin: 13.6 g/dL (ref 13.0–17.0)
Lymphocytes Relative: 37.6 % (ref 12.0–46.0)
Lymphs Abs: 2 10*3/uL (ref 0.7–4.0)
MCHC: 32.6 g/dL (ref 30.0–36.0)
MCV: 83.1 fl (ref 78.0–100.0)
Monocytes Absolute: 0.5 10*3/uL (ref 0.1–1.0)
Monocytes Relative: 9.3 % (ref 3.0–12.0)
Neutro Abs: 2.5 10*3/uL (ref 1.4–7.7)
Neutrophils Relative %: 46 % (ref 43.0–77.0)
Platelets: 278 10*3/uL (ref 150.0–400.0)
RBC: 5.04 Mil/uL (ref 4.22–5.81)
RDW: 14.7 % (ref 11.5–15.5)
WBC: 5.4 10*3/uL (ref 4.0–10.5)

## 2015-01-06 LAB — HEPATIC FUNCTION PANEL
ALT: 23 U/L (ref 0–53)
AST: 17 U/L (ref 0–37)
Albumin: 4.3 g/dL (ref 3.5–5.2)
Alkaline Phosphatase: 51 U/L (ref 39–117)
Bilirubin, Direct: 0.1 mg/dL (ref 0.0–0.3)
Total Bilirubin: 0.3 mg/dL (ref 0.2–1.2)
Total Protein: 7 g/dL (ref 6.0–8.3)

## 2015-01-06 LAB — HEMOGLOBIN A1C: Hgb A1c MFr Bld: 6.6 % — ABNORMAL HIGH (ref 4.6–6.5)

## 2015-01-06 MED ORDER — ONDANSETRON HCL 8 MG PO TABS: 8.0000 mg | ORAL_TABLET | Freq: Three times a day (TID) | ORAL | Status: DC | PRN

## 2015-01-06 NOTE — Patient Instructions (Signed)

## 2015-01-06 NOTE — Progress Notes (Signed)
Subjective:    Patient ID: Raymond Lutz, male    DOB: [birth year], 49 y.o.   MRN: [id number]  HPI 49 year old African-American male, nonsmoker with a history of type 2 diabetes, hyperlipidemia, hypertension, obesity, and coronary artery disease is in today with complaints of nausea, vomiting, diarrhea that began yesterday after eating at [employer]. Wife 8 as well which she is not sick. Describes a crampy abdominal pain that he rates a 4 out of 10. Better with defecation. Stools are loose and watery. Has not taken any medication for relief at this point. Has not had labs for diabetes in over a year.   Review of Systems  Constitutional: Negative.   HENT: Negative.   Respiratory: Negative.   Cardiovascular: Negative.   Gastrointestinal: Positive for nausea, vomiting, abdominal pain and diarrhea. Negative for constipation, blood in stool and abdominal distention.  Endocrine: Negative.   Genitourinary: Negative.   Musculoskeletal: Negative.   Skin: Negative.   Allergic/Immunologic: Negative.   Neurological: Negative.   Hematological: Negative.   Psychiatric/Behavioral: Negative.    Past Medical History  Diagnosis Date  . DIABETES MELLITUS, TYPE II 10/17/2006  . HYPERLIPIDEMIA 10/17/2006  . OBESITY 10/17/2006  . HYPERTENSION 10/17/2006  . Asthma   . Chest pain     a. 05/2001 - Cardiolite: EF 47%, Diaphragmatic Attenuation.  Marland Kitchen GERD (gastroesophageal reflux disease)   . Paroxysmal atrial fibrillation (HCC)     2006 - was on coumadin - took himself off 6 mos after initiation.  . Shortness of breath   . Sleep apnea     not using CPAP regularly  . ED (erectile dysfunction)   . Arthritis   . Coronary artery disease      nonobstructive ASCAD with 50% OM, 20-30% dLAD, 30-40%dRCA at cath 2013     Social History   Social History  . Marital Status: Married    Spouse Name: N/A  . Number of Children: N/A  . Years of Education: N/A   Occupational History  . Not on file.   Social  History Main Topics  . Smoking status: Never Smoker   . Smokeless tobacco: Never Used  . Alcohol Use: No  . Drug Use: No  . Sexual Activity: Yes   Other Topics Concern  . 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  Lives in [place] with wife - 2 children - 13, 10.    Past Surgical History  Procedure Laterality Date  . Hernia repair    . Hernia repair    . Skin grafts    . Arthroscopic knee    . Left heart catheterization with coronary angiogram N/A 05/03/2011    Procedure: LEFT HEART CATHETERIZATION WITH CORONARY ANGIOGRAM;  Surgeon: Iran Ouch, MD;  Location: [facility];  Service: Cardiovascular;  Laterality: N/A;  . Cardiac catheterization      Family History  Problem Relation Age of Onset  . Diabetes Maternal Grandmother   . Hypertension Brother   . Heart attack Sister     died mid 65's  . Heart attack Brother     died early 44's  . Cerebral aneurysm Mother     died mid 62's  . Lung cancer Father     died @ 83    No Known Allergies  Current Outpatient Prescriptions on File Prior to Visit  Medication Sig Dispense Refill  . aspirin EC 325 MG tablet Take 325 mg by mouth daily.    Marland Kitchen  atorvastatin (LIPITOR) 20 MG tablet TAKE ONE TABLET BY MOUTH AT BEDTIME 90 tablet 0  . diltiazem (TAZTIA XT) 240 MG 24 hr capsule Take 1 capsule (240 mg total) by mouth daily. 100 capsule 3  . furosemide (LASIX) 40 MG tablet TAKE TWO TABLETS BY MOUTH ONCE DAILY IN THE MORNING. 180 tablet 3  . glipiZIDE (GLUCOTROL) 5 MG tablet Take 1 tablet (5 mg total) by mouth 2 (two) times daily before a meal. 200 tablet 0  . glucose blood test strip 1 each by Other route. Use as instructed     . indomethacin (INDOCIN) 25 MG capsule TAKE ONE CAPSULE BY MOUTH TWICE DAILY WITH FOOD FOR GOUT 50 capsule 3  . lisinopril (PRINIVIL,ZESTRIL) 40 MG tablet Take 1 tablet (40 mg total) by mouth daily. 90 tablet 0  . metFORMIN (GLUCOPHAGE) 1000 MG tablet Take 1 tablet (1,000 mg total)  by mouth 2 (two) times daily with a meal. 200 tablet 3  . potassium chloride SA (K-DUR,KLOR-CON) 20 MEQ tablet TAKE TWO TABLETS BY MOUTH IN THE MORNING 200 tablet 0  . albuterol (VENTOLIN HFA) 108 (90 BASE) MCG/ACT inhaler Inhale 2 puffs into the lungs every 6 (six) hours as needed. (Patient not taking: Reported on 01/06/2015) 1 Inhaler 1   No current facility-administered medications on file prior to visit.    BP 160/82 mmHg  Temp(Src) 98.1 F (36.7 C)  Wt 397 lb (180.078 kg)chart    Objective:   Physical Exam  Constitutional: He is oriented to person, place, and time. He appears well-developed and well-nourished.  HENT:  Right Ear: External ear normal.  Left Ear: External ear normal.  Nose: Nose normal.  Mouth/Throat: Oropharynx is clear and moist.  Neck: Normal range of motion. Neck supple.  Cardiovascular: Normal rate, regular rhythm and normal heart sounds.   Pulmonary/Chest: Effort normal and breath sounds normal.  Abdominal: Soft.  Musculoskeletal: Normal range of motion.  Neurological: He is alert and oriented to person, place, and time.  Skin: Skin is warm and dry.          Assessment & Plan:  Diagnoses and all orders for this visit:  Nausea and vomiting, intractability of vomiting not specified, unspecified vomiting type -     CBC with Differential -     Basic Metabolic Panel -     Hepatic Function Panel -     Hemoglobin A1c  Diarrhea, unspecified type -     CBC with Differential -     Basic Metabolic Panel -     Hepatic Function Panel -     Hemoglobin A1c  Uncontrolled type 2 diabetes mellitus without complication, without long-term current use of insulin (HCC) -     CBC with Differential -     Basic Metabolic Panel -     Hepatic Function Panel -     Hemoglobin A1c  Other orders -     ondansetron (ZOFRAN) 8 MG tablet; Take 1 tablet (8 mg total) by mouth every 8 (eight) hours as needed for nausea or vomiting.   Clear liquids and advance as able to  tolerate it. Bland diet. Zofran as needed for nausea and vomiting. Labs obtained today will notify patient pending results. Follow-up with Dr. Tawanna Coolerodd in 3 months and sooner as needed.

## 2015-01-11 ENCOUNTER — Other Ambulatory Visit: Payer: Self-pay | Admitting: Family Medicine

## 2015-01-11 MED ORDER — POTASSIUM CHLORIDE CRYS ER 20 MEQ PO TBCR: 40.0000 meq | EXTENDED_RELEASE_TABLET | Freq: Every morning | ORAL | Status: DC

## 2015-03-08 ENCOUNTER — Other Ambulatory Visit: Payer: Self-pay | Admitting: Family Medicine

## 2015-03-24 ENCOUNTER — Other Ambulatory Visit: Payer: Self-pay | Admitting: Family Medicine

## 2015-03-24 MED ORDER — ATORVASTATIN CALCIUM 20 MG PO TABS
20.0000 mg | ORAL_TABLET | Freq: Every day | ORAL | Status: DC
Start: 2015-03-24 — End: 2015-07-08

## 2015-03-24 NOTE — Addendum Note (Signed)
Addended by: Kern ReapVEREEN, Myley Bahner B on: 03/24/2015 10:34 AM   Modules accepted: Orders

## 2015-04-05 ENCOUNTER — Other Ambulatory Visit: Payer: Self-pay | Admitting: Family Medicine

## 2015-04-06 ENCOUNTER — Other Ambulatory Visit: Payer: Self-pay | Admitting: Family Medicine

## 2015-04-06 MED ORDER — DILTIAZEM HCL ER BEADS 240 MG PO CP24: 240.0000 mg | ORAL_CAPSULE | Freq: Every day | ORAL | Status: DC

## 2015-05-17 ENCOUNTER — Other Ambulatory Visit: Payer: Self-pay | Admitting: *Deleted

## 2015-05-17 MED ORDER — POTASSIUM CHLORIDE CRYS ER 20 MEQ PO TBCR: 40.0000 meq | EXTENDED_RELEASE_TABLET | Freq: Every morning | ORAL | Status: DC

## 2015-07-08 ENCOUNTER — Other Ambulatory Visit: Payer: Self-pay | Admitting: Family Medicine

## 2015-07-27 ENCOUNTER — Other Ambulatory Visit: Payer: Self-pay | Admitting: *Deleted

## 2015-07-27 MED ORDER — GLIPIZIDE 5 MG PO TABS: 5.0000 mg | ORAL_TABLET | Freq: Two times a day (BID) | ORAL | Status: DC

## 2015-07-27 NOTE — Telephone Encounter (Signed)
Received fax refill request from [facility] for Glipizide

## 2015-08-13 ENCOUNTER — Encounter: Payer: Self-pay | Admitting: Adult Health

## 2015-08-13 ENCOUNTER — Ambulatory Visit (INDEPENDENT_AMBULATORY_CARE_PROVIDER_SITE_OTHER): Payer: BLUE CROSS/BLUE SHIELD | Admitting: Adult Health

## 2015-08-13 VITALS — BP 136/70 | Temp 98.5°F

## 2015-08-13 DIAGNOSIS — J351 Hypertrophy of tonsils: Secondary | ICD-10-CM | POA: Diagnosis not present

## 2015-08-13 DIAGNOSIS — J029 Acute pharyngitis, unspecified: Secondary | ICD-10-CM | POA: Diagnosis not present

## 2015-08-13 DIAGNOSIS — E119 Type 2 diabetes mellitus without complications: Secondary | ICD-10-CM | POA: Diagnosis not present

## 2015-08-13 LAB — POCT GLYCOSYLATED HEMOGLOBIN (HGB A1C): Hemoglobin A1C: 6.3

## 2015-08-13 MED ORDER — MAGIC MOUTHWASH W/LIDOCAINE: 5.0000 mL | Freq: Three times a day (TID) | ORAL | Status: DC | PRN

## 2015-08-13 MED ORDER — PENICILLIN G BENZATHINE 1200000 UNIT/2ML IM SUSP
1.2000 10*6.[IU] | Freq: Once | INTRAMUSCULAR | Status: AC
  Administered 2015-08-13: 1.2 10*6.[IU] via INTRAMUSCULAR

## 2015-08-13 MED ORDER — METHYLPREDNISOLONE ACETATE 80 MG/ML IJ SUSP
120.0000 mg | Freq: Once | INTRAMUSCULAR | Status: AC
  Administered 2015-08-13: 120 mg via INTRAMUSCULAR

## 2015-08-13 NOTE — Addendum Note (Signed)
Addended by: Bonnye FavaKWEI, NANA K on: 08/13/2015 11:52 AM   Modules accepted: Orders

## 2015-08-13 NOTE — Progress Notes (Signed)
Subjective:    Patient ID: Raymond Lutz, male    DOB: [birth year], 50 y.o.   MRN: [id number]  Sore Throat  This is a new problem. The current episode started in the past 7 days. The problem has been gradually worsening. The maximum temperature recorded prior to his arrival was 101 - 101.9 F. The fever has been present for 1 to 2 days. Associated symptoms include coughing, headaches, a hoarse voice, swollen glands and trouble swallowing. Pertinent negatives include no drooling, ear discharge, ear pain, plugged ear sensation, shortness of breath or stridor. He has had exposure to strep. He has had no exposure to mono. He has tried acetaminophen for the symptoms. The treatment provided no relief.   His wife was diagnosed with strep last week and multiple people at work have had strep as well.   Review of Systems  Constitutional: Positive for fever, activity change and fatigue.  HENT: Positive for hoarse voice, rhinorrhea, sinus pressure, sore throat, trouble swallowing and voice change. Negative for drooling, ear discharge and ear pain.   Eyes: Negative.   Respiratory: Positive for cough. Negative for shortness of breath and stridor.   Cardiovascular: Negative.   Neurological: Positive for headaches. Negative for dizziness.  Psychiatric/Behavioral: Negative.    Past Medical History  Diagnosis Date  . DIABETES MELLITUS, TYPE II 10/17/2006  . HYPERLIPIDEMIA 10/17/2006  . OBESITY 10/17/2006  . HYPERTENSION 10/17/2006  . Asthma   . Chest pain     a. 05/2001 - Cardiolite: EF 47%, Diaphragmatic Attenuation.  Marland Kitchen. GERD (gastroesophageal reflux disease)   . Paroxysmal atrial fibrillation (HCC)     2006 - was on coumadin - took himself off 6 mos after initiation.  . Shortness of breath   . Sleep apnea     not using CPAP regularly  . ED (erectile dysfunction)   . Arthritis   . Coronary artery disease      nonobstructive ASCAD with 50% OM, 20-30% dLAD, 30-40%dRCA at cath 2013     Social History    Social History  . Marital Status: Married    Spouse Name: N/A  . Number of Children: N/A  . Years of Education: N/A   Occupational History  . Not on file.   Social History Main Topics  . Smoking status: Never Smoker   . Smokeless tobacco: Never Used  . Alcohol Use: No  . Drug Use: No  . Sexual Activity: Yes   Other Topics Concern  . 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  Lives in [place] with wife - 2 children - 13, 10.    Past Surgical History  Procedure Laterality Date  . Hernia repair    . Hernia repair    . Skin grafts    . Arthroscopic knee    . Left heart catheterization with coronary angiogram N/A 05/03/2011    Procedure: LEFT HEART CATHETERIZATION WITH CORONARY ANGIOGRAM;  Surgeon: Iran OuchMuhammad A Arida, MD;  Location: [facility];  Service: Cardiovascular;  Laterality: N/A;  . Cardiac catheterization      Family History  Problem Relation Age of Onset  . Diabetes Maternal Grandmother   . Hypertension Brother   . Heart attack Sister     died mid 4650's  . Heart attack Brother     died early 1150's  . Cerebral aneurysm Mother     died mid 2760's  . Lung cancer Father     died @ 9470  No Known Allergies  Current Outpatient Prescriptions on File Prior to Visit  Medication Sig Dispense Refill  . aspirin EC 325 MG tablet Take 325 mg by mouth daily.    Marland Kitchen atorvastatin (LIPITOR) 20 MG tablet TAKE ONE TABLET BY MOUTH AT BEDTIME 90 tablet 0  . diltiazem (TAZTIA XT) 240 MG 24 hr capsule Take 1 capsule (240 mg total) by mouth daily. 90 capsule 0  . furosemide (LASIX) 40 MG tablet TAKE TWO TABLETS BY MOUTH ONCE DAILY IN THE MORNING. 180 tablet 3  . furosemide (LASIX) 40 MG tablet TAKE TWO TABLETS BY MOUTH ONCE DAILY IN THE MORNING *NEED APPOINTMENT* 180 tablet 0  . glipiZIDE (GLUCOTROL) 5 MG tablet Take 1 tablet (5 mg total) by mouth 2 (two) times daily before a meal. 60 tablet 0  . glucose blood test strip 1 each by Other route. Use as  instructed     . indomethacin (INDOCIN) 25 MG capsule TAKE ONE CAPSULE BY MOUTH TWICE DAILY WITH FOOD FOR GOUT 50 capsule 3  . lisinopril (PRINIVIL,ZESTRIL) 40 MG tablet Take 1 tablet (40 mg total) by mouth daily. 90 tablet 0  . lisinopril (PRINIVIL,ZESTRIL) 40 MG tablet TAKE ONE TABLET BY MOUTH ONCE DAILY 90 tablet 0  . metFORMIN (GLUCOPHAGE) 1000 MG tablet Take 1 tablet (1,000 mg total) by mouth 2 (two) times daily with a meal. 200 tablet 3  . ondansetron (ZOFRAN) 8 MG tablet Take 1 tablet (8 mg total) by mouth every 8 (eight) hours as needed for nausea or vomiting. 20 tablet 0  . potassium chloride SA (K-DUR,KLOR-CON) 20 MEQ tablet Take 2 tablets (40 mEq total) by mouth every morning. 200 tablet 0  . albuterol (VENTOLIN HFA) 108 (90 BASE) MCG/ACT inhaler Inhale 2 puffs into the lungs every 6 (six) hours as needed. (Patient not taking: Reported on 01/06/2015) 1 Inhaler 1   No current facility-administered medications on file prior to visit.    BP 136/70 mmHg  Temp(Src) 98.5 F (36.9 C) (Oral)       Objective:   Physical Exam  Constitutional: He is oriented to person, place, and time. He appears well-developed and well-nourished. No distress.  HENT:  Head: Normocephalic and atraumatic.  Right Ear: Hearing, tympanic membrane, external ear and ear canal normal.  Left Ear: Hearing, tympanic membrane, external ear and ear canal normal.  Nose: Mucosal edema present. No rhinorrhea or sinus tenderness. Right sinus exhibits maxillary sinus tenderness and frontal sinus tenderness. Left sinus exhibits maxillary sinus tenderness and frontal sinus tenderness.  Mouth/Throat: Uvula is midline and mucous membranes are normal. Posterior oropharyngeal edema (hypertrophic tonsils) and posterior oropharyngeal erythema present. No oropharyngeal exudate or tonsillar abscesses.  Eyes: Conjunctivae and EOM are normal. Pupils are equal, round, and reactive to light. Right eye exhibits no discharge.  Neck:  Normal range of motion. Neck supple.  Cardiovascular: Normal rate, regular rhythm, normal heart sounds and intact distal pulses.  Exam reveals no gallop and no friction rub.   No murmur heard. Pulmonary/Chest: Effort normal and breath sounds normal. No respiratory distress. He has no wheezes. He has no rales. He exhibits no tenderness.  Lymphadenopathy:       Head (right side): Submandibular and tonsillar adenopathy present.       Head (left side): Submandibular and tonsillar adenopathy present.    He has cervical adenopathy.       Right cervical: Deep cervical adenopathy present.       Left cervical: Deep cervical adenopathy present.  Neurological: He  is alert and oriented to person, place, and time.  Skin: Skin is warm and dry. No rash noted. He is not diaphoretic. No erythema. No pallor.  Psychiatric: He has a normal mood and affect. His behavior is normal. Judgment and thought content normal.  Nursing note and vitals reviewed.      Assessment & Plan:  1. Sore throat - Will treat due to exposure and symptoms.  - penicillin g benzathine (BICILLIN LA) 1200000 UNIT/2ML injection 1.2 Million Units; Inject 2 mLs (1.2 Million Units total) into the muscle once. - methylPREDNISolone acetate (DEPO-MEDROL) injection 120 mg; Inject 1.5 mLs (120 mg total) into the muscle once. - magic mouthwash w/lidocaine SOLN; Take 5 mLs by mouth 3 (three) times daily as needed.  Dispense: 180 mL; Refill: 0 - Follow up if no improvement - Can also use honey or warm salt water gargles - Nsaids for symptom relief.  2. Hypertrophy tonsils - methylPREDNISolone acetate (DEPO-MEDROL) injection 120 mg; Inject 1.5 mLs (120 mg total) into the muscle once. - He was advised to go to the ER if he felt as though his throat was closing or he was having trouble breathing.   3. Controlled type 2 diabetes mellitus without complication, without long-term current use of insulin (HCC) - POC HgB A1c 6.3  Shirline Frees, NP

## 2015-08-13 NOTE — Patient Instructions (Signed)
It was great meeting you today!  We have given you a shot of steroids in the office today as well as a shot of penicillin. This will cover you for strep as well as help with the swelling.   You can use the magic mouth wash as needed for throat pain. Gargle for 15 seconds and spit  Use ibuprofen to help with the pain and swelling  Follow up if no improvement.   If you have trouble breathing then please go to the ER.

## 2015-08-15 LAB — CULTURE, GROUP A STREP: Organism ID, Bacteria: NORMAL

## 2015-09-08 ENCOUNTER — Other Ambulatory Visit: Payer: Self-pay | Admitting: Family Medicine

## 2015-09-09 MED ORDER — FUROSEMIDE 40 MG PO TABS: 40.0000 mg | ORAL_TABLET | Freq: Two times a day (BID) | ORAL | Status: DC

## 2015-09-09 MED ORDER — LISINOPRIL 40 MG PO TABS: 40.0000 mg | ORAL_TABLET | Freq: Every day | ORAL | Status: DC

## 2015-09-09 MED ORDER — ATORVASTATIN CALCIUM 20 MG PO TABS: 20.0000 mg | ORAL_TABLET | Freq: Every day | ORAL | Status: DC

## 2015-09-09 MED ORDER — DILTIAZEM HCL ER BEADS 240 MG PO CP24: 240.0000 mg | ORAL_CAPSULE | Freq: Every day | ORAL | Status: DC

## 2015-09-09 MED ORDER — POTASSIUM CHLORIDE CRYS ER 20 MEQ PO TBCR: 40.0000 meq | EXTENDED_RELEASE_TABLET | Freq: Every morning | ORAL | Status: DC

## 2015-09-09 NOTE — Addendum Note (Signed)
Addended by: Janelle FloorHOMPSON, Hera Celaya B on: 09/09/2015 03:22 PM   Modules accepted: Orders

## 2015-10-21 ENCOUNTER — Other Ambulatory Visit: Payer: Self-pay | Admitting: Family Medicine

## 2015-10-22 ENCOUNTER — Other Ambulatory Visit: Payer: Self-pay | Admitting: *Deleted

## 2015-10-22 MED ORDER — GLIPIZIDE 5 MG PO TABS: 5.0000 mg | ORAL_TABLET | Freq: Two times a day (BID) | ORAL | 0 refills | Status: DC

## 2015-10-22 NOTE — Telephone Encounter (Signed)
Rx done. 

## 2015-12-05 ENCOUNTER — Other Ambulatory Visit: Payer: Self-pay | Admitting: Adult Health

## 2015-12-10 ENCOUNTER — Encounter: Payer: Self-pay | Admitting: Adult Health

## 2015-12-10 ENCOUNTER — Ambulatory Visit (INDEPENDENT_AMBULATORY_CARE_PROVIDER_SITE_OTHER): Payer: BLUE CROSS/BLUE SHIELD | Admitting: Adult Health

## 2015-12-10 VITALS — BP 180/82 | Temp 98.1°F

## 2015-12-10 DIAGNOSIS — E119 Type 2 diabetes mellitus without complications: Secondary | ICD-10-CM | POA: Diagnosis not present

## 2015-12-10 DIAGNOSIS — Z760 Encounter for issue of repeat prescription: Secondary | ICD-10-CM

## 2015-12-10 DIAGNOSIS — I10 Essential (primary) hypertension: Secondary | ICD-10-CM | POA: Diagnosis not present

## 2015-12-10 LAB — BASIC METABOLIC PANEL
BUN: 13 mg/dL (ref 6–23)
CO2: 32 mEq/L (ref 19–32)
Calcium: 9 mg/dL (ref 8.4–10.5)
Chloride: 104 mEq/L (ref 96–112)
Creatinine, Ser: 0.81 mg/dL (ref 0.40–1.50)
GFR: 130.33 mL/min (ref >60.00)
Glucose, Bld: 109 mg/dL — ABNORMAL HIGH (ref 70–99)
Potassium: 4 mEq/L (ref 3.5–5.1)
Sodium: 141 mEq/L (ref 135–145)

## 2015-12-10 LAB — POCT GLYCOSYLATED HEMOGLOBIN (HGB A1C): Hemoglobin A1C: 6.6

## 2015-12-10 MED ORDER — FUROSEMIDE 40 MG PO TABS: 40.0000 mg | ORAL_TABLET | Freq: Two times a day (BID) | ORAL | 11 refills | Status: DC

## 2015-12-10 MED ORDER — POTASSIUM CHLORIDE CRYS ER 20 MEQ PO TBCR: 40.0000 meq | EXTENDED_RELEASE_TABLET | Freq: Every morning | ORAL | 11 refills | Status: DC

## 2015-12-10 MED ORDER — ATORVASTATIN CALCIUM 20 MG PO TABS: 20.0000 mg | ORAL_TABLET | Freq: Every day | ORAL | 11 refills | Status: DC

## 2015-12-10 MED ORDER — GLIPIZIDE 5 MG PO TABS: 5.0000 mg | ORAL_TABLET | Freq: Two times a day (BID) | ORAL | 11 refills | Status: DC

## 2015-12-10 MED ORDER — DILTIAZEM HCL ER BEADS 240 MG PO CP24: 240.0000 mg | ORAL_CAPSULE | Freq: Every day | ORAL | 11 refills | Status: DC

## 2015-12-10 MED ORDER — METFORMIN HCL ER 750 MG PO TB24: 750.0000 mg | ORAL_TABLET | Freq: Every day | ORAL | 11 refills | Status: DC

## 2015-12-10 NOTE — Patient Instructions (Signed)
It was great seeing you today!  I have sent in all medications that you needed  I will follow up with you regarding the blood work.   Please follow up with Dr. Tawanna Coolerodd for your physical

## 2015-12-10 NOTE — Progress Notes (Addendum)
Subjective:    Patient ID: Raymond Lutz, male    DOB: [birth year], 50 y.o.   MRN: [id number]  HPI  50 year old male who presents to the office today for follow up for diabetes and hypertension.   He reports that he has been out of his blood pressure medications for about a week. He is not checking his blood pressures on a consistent basis. He was not able to get into the office any sooner [place]. Today in the office his blood pressure is elevated at 180/82. He denies any headaches, blurred vision or dizziness.   He also reports that he has not been taking Metformin due to it " ripping up my stomach." He has also not been checking his blood sugars at home on a regular basis. His diet has been poor, " I have been drinking a lot of soda" He is not exercising on a regular basis.    Review of Systems  Constitutional: Negative.   Respiratory: Negative.   Cardiovascular: Negative.   Neurological: Negative.   All other systems reviewed and are negative.  Past Medical History:  Diagnosis Date  . Arthritis   . Asthma   . Chest pain    a. 05/2001 - Cardiolite: EF 47%, Diaphragmatic Attenuation.  . Coronary artery disease     nonobstructive ASCAD with 50% OM, 20-30% dLAD, 30-40%dRCA at cath 2013   . DIABETES MELLITUS, TYPE II 10/17/2006  . ED (erectile dysfunction)   . GERD (gastroesophageal reflux disease)   . HYPERLIPIDEMIA 10/17/2006  . HYPERTENSION 10/17/2006  . OBESITY 10/17/2006  . Paroxysmal atrial fibrillation (HCC)    2006 - was on coumadin - took himself off 6 mos after initiation.  . Shortness of breath   . Sleep apnea    not using CPAP regularly    Social History   Social History  . Marital status: Married    Spouse name: N/A  . Number of children: N/A  . Years of education: N/A   Occupational History  . Not on file.   Social History Main Topics  . Smoking status: Never Smoker  . Smokeless tobacco: Never Used  . Alcohol use No    . Drug use: No  . Sexual activity: Yes   Other Topics Concern  . 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  Lives in [place] with wife - 2 children - 13, 10.    Past Surgical History:  Procedure Laterality Date  . arthroscopic knee    . CARDIAC CATHETERIZATION    . HERNIA REPAIR    . HERNIA REPAIR    . LEFT HEART CATHETERIZATION WITH CORONARY ANGIOGRAM N/A 05/03/2011   Procedure: LEFT HEART CATHETERIZATION WITH CORONARY ANGIOGRAM;  Surgeon: Iran Ouch, MD;  Location: [facility];  Service: Cardiovascular;  Laterality: N/A;  . skin grafts      Family History  Problem Relation Age of Onset  . Diabetes Maternal Grandmother   . Hypertension Brother   . Heart attack Sister     died mid 19's  . Heart attack Brother     died early 44's  . Cerebral aneurysm Mother     died mid 77's  . Lung cancer Father     died @ 39    No Known Allergies  Current Outpatient Prescriptions on File Prior to Visit  Medication Sig Dispense Refill  . albuterol (VENTOLIN HFA) 108 (  90 BASE) MCG/ACT inhaler Inhale 2 puffs into the lungs every 6 (six) hours as needed. 1 Inhaler 1  . aspirin EC 325 MG tablet Take 325 mg by mouth daily.    Marland Kitchen. glucose blood test strip 1 each by Other route. Use as instructed     . indomethacin (INDOCIN) 25 MG capsule TAKE ONE CAPSULE BY MOUTH TWICE DAILY WITH FOOD FOR GOUT 50 capsule 3  . lisinopril (PRINIVIL,ZESTRIL) 40 MG tablet Take 1 tablet (40 mg total) by mouth daily. 90 tablet 0   No current facility-administered medications on file prior to visit.     BP (!) 180/82   Temp 98.1 F (36.7 C) (Oral)       Objective:   Physical Exam  Constitutional: He is oriented to person, place, and time. He appears well-developed and well-nourished. No distress.  Morbidly obese  Cardiovascular: Normal rate, regular rhythm, normal heart sounds and intact distal pulses.  Exam reveals no gallop and no friction rub.   No murmur  heard. Pulmonary/Chest: Effort normal and breath sounds normal. No respiratory distress. He has no wheezes. He has no rales. He exhibits no tenderness.  Neurological: He is alert and oriented to person, place, and time.  Skin: Skin is warm and dry. No rash noted. He is not diaphoretic. No erythema.  Psychiatric: He has a normal mood and affect. His behavior is normal. Judgment and thought content normal.  Nursing note and vitals reviewed.      Assessment & Plan:  1. Controlled type 2 diabetes mellitus without complication, without long-term current use of insulin (HCC) - POC HgB A1c - 6.6. Has increased from 6.3, three months ago  - glipiZIDE (GLUCOTROL) 5 MG tablet; Take 1 tablet (5 mg total) by mouth 2 (two) times daily before a meal.  Dispense: 60 tablet; Refill: 11 - metFORMIN (GLUCOPHAGE-XR) 750 MG 24 hr tablet; Take 1 tablet (750 mg total) by mouth daily with breakfast.  Dispense: 30 tablet; Refill: 11 - Basic metabolic panel - Follow up with Dr. Tawanna Coolerodd for CPE 2. Essential hypertension - not at goal. Appears to be at goal with medications?   BP Readings from Last 3 Encounters:  12/10/15 (!) 180/82  08/13/15 136/70  01/06/15 (!) 160/82   - diltiazem (TAZTIA XT) 240 MG 24 hr capsule; Take 1 capsule (240 mg total) by mouth daily.  Dispense: 30 capsule; Refill: 11 - furosemide (LASIX) 40 MG tablet; Take 1 tablet (40 mg total) by mouth 2 (two) times daily.  Dispense: 60 tablet; Refill: 11 - potassium chloride SA (K-DUR,KLOR-CON) 20 MEQ tablet; Take 2 tablets (40 mEq total) by mouth every morning.  Dispense: 60 tablet; Refill: 11 - Basic metabolic panel - Follow up with Dr. Tawanna Coolerodd  3. Medication refill  - diltiazem (TAZTIA XT) 240 MG 24 hr capsule; Take 1 capsule (240 mg total) by mouth daily.  Dispense: 30 capsule; Refill: 11 - atorvastatin (LIPITOR) 20 MG tablet; Take 1 tablet (20 mg total) by mouth at bedtime.  Dispense: 30 tablet; Refill: 11 - furosemide (LASIX) 40 MG tablet; Take  1 tablet (40 mg total) by mouth 2 (two) times daily.  Dispense: 60 tablet; Refill: 11 - potassium chloride SA (K-DUR,KLOR-CON) 20 MEQ tablet; Take 2 tablets (40 mEq total) by mouth every morning.  Dispense: 60 tablet; Refill: 11 - glipiZIDE (GLUCOTROL) 5 MG tablet; Take 1 tablet (5 mg total) by mouth 2 (two) times daily before a meal.  Dispense: 60 tablet; Refill: 11 - metFORMIN (GLUCOPHAGE-XR) 750  MG 24 hr tablet; Take 1 tablet (750 mg total) by mouth daily with breakfast.  Dispense: 30 tablet; Refill: 11 - Basic metabolic panel  Shirline Frees, NP

## 2015-12-16 ENCOUNTER — Ambulatory Visit (INDEPENDENT_AMBULATORY_CARE_PROVIDER_SITE_OTHER): Payer: BLUE CROSS/BLUE SHIELD | Admitting: Family Medicine

## 2015-12-16 ENCOUNTER — Encounter: Payer: Self-pay | Admitting: Family Medicine

## 2015-12-16 ENCOUNTER — Inpatient Hospital Stay (HOSPITAL_COMMUNITY)
Admission: EM | Admit: 2015-12-16 | Discharge: 2015-12-18 | DRG: 247 | Disposition: A | Discharge status: Home / Self Care | Payer: BLUE CROSS/BLUE SHIELD | Attending: Internal Medicine | Admitting: Internal Medicine

## 2015-12-16 ENCOUNTER — Other Ambulatory Visit: Payer: Self-pay | Admitting: Adult Health

## 2015-12-16 ENCOUNTER — Encounter: Payer: Self-pay | Admitting: *Deleted

## 2015-12-16 ENCOUNTER — Emergency Department (HOSPITAL_COMMUNITY): Payer: BLUE CROSS/BLUE SHIELD

## 2015-12-16 ENCOUNTER — Encounter (HOSPITAL_COMMUNITY): Payer: Self-pay | Admitting: Emergency Medicine

## 2015-12-16 VITALS — BP 152/92 | HR 83 | Temp 98.2°F | Wt >= 6400 oz

## 2015-12-16 DIAGNOSIS — R079 Chest pain, unspecified: Secondary | ICD-10-CM

## 2015-12-16 DIAGNOSIS — R0789 Other chest pain: Secondary | ICD-10-CM | POA: Diagnosis not present

## 2015-12-16 DIAGNOSIS — I209 Angina pectoris, unspecified: Secondary | ICD-10-CM

## 2015-12-16 DIAGNOSIS — I2510 Atherosclerotic heart disease of native coronary artery without angina pectoris: Secondary | ICD-10-CM | POA: Diagnosis present

## 2015-12-16 DIAGNOSIS — Z7982 Long term (current) use of aspirin: Secondary | ICD-10-CM

## 2015-12-16 DIAGNOSIS — Z9119 Patient's noncompliance with other medical treatment and regimen: Secondary | ICD-10-CM

## 2015-12-16 DIAGNOSIS — E119 Type 2 diabetes mellitus without complications: Secondary | ICD-10-CM | POA: Diagnosis not present

## 2015-12-16 DIAGNOSIS — I10 Essential (primary) hypertension: Secondary | ICD-10-CM | POA: Diagnosis present

## 2015-12-16 DIAGNOSIS — R0602 Shortness of breath: Secondary | ICD-10-CM

## 2015-12-16 DIAGNOSIS — I160 Hypertensive urgency: Secondary | ICD-10-CM | POA: Diagnosis not present

## 2015-12-16 DIAGNOSIS — I214 Non-ST elevation (NSTEMI) myocardial infarction: Principal | ICD-10-CM | POA: Diagnosis present

## 2015-12-16 DIAGNOSIS — I25110 Atherosclerotic heart disease of native coronary artery with unstable angina pectoris: Secondary | ICD-10-CM | POA: Diagnosis present

## 2015-12-16 DIAGNOSIS — K219 Gastro-esophageal reflux disease without esophagitis: Secondary | ICD-10-CM

## 2015-12-16 DIAGNOSIS — E784 Other hyperlipidemia: Secondary | ICD-10-CM

## 2015-12-16 DIAGNOSIS — Z6841 Body Mass Index (BMI) 40.0 and over, adult: Secondary | ICD-10-CM

## 2015-12-16 DIAGNOSIS — I2583 Coronary atherosclerosis due to lipid rich plaque: Secondary | ICD-10-CM

## 2015-12-16 DIAGNOSIS — I249 Acute ischemic heart disease, unspecified: Secondary | ICD-10-CM

## 2015-12-16 DIAGNOSIS — G4733 Obstructive sleep apnea (adult) (pediatric): Secondary | ICD-10-CM

## 2015-12-16 DIAGNOSIS — Z760 Encounter for issue of repeat prescription: Secondary | ICD-10-CM

## 2015-12-16 DIAGNOSIS — E785 Hyperlipidemia, unspecified: Secondary | ICD-10-CM | POA: Diagnosis present

## 2015-12-16 DIAGNOSIS — I517 Cardiomegaly: Secondary | ICD-10-CM | POA: Diagnosis present

## 2015-12-16 DIAGNOSIS — Z23 Encounter for immunization: Secondary | ICD-10-CM

## 2015-12-16 DIAGNOSIS — Z8249 Family history of ischemic heart disease and other diseases of the circulatory system: Secondary | ICD-10-CM

## 2015-12-16 DIAGNOSIS — R600 Localized edema: Secondary | ICD-10-CM | POA: Diagnosis present

## 2015-12-16 DIAGNOSIS — Z7984 Long term (current) use of oral hypoglycemic drugs: Secondary | ICD-10-CM

## 2015-12-16 HISTORY — DX: Non-ST elevation (NSTEMI) myocardial infarction: I21.4

## 2015-12-16 LAB — I-STAT TROPONIN, ED: Troponin i, poc: 0.66 ng/mL (ref 0.00–0.08)

## 2015-12-16 LAB — PROTIME-INR
INR: 0.88
Prothrombin Time: 11.9 seconds (ref 11.4–15.2)

## 2015-12-16 LAB — URINE MICROSCOPIC-ADD ON
Bacteria, UA: NONE SEEN
Squamous Epithelial / LPF: NONE SEEN
WBC, UA: NONE SEEN WBC/hpf (ref 0–5)

## 2015-12-16 LAB — URINALYSIS, ROUTINE W REFLEX MICROSCOPIC
Bilirubin Urine: NEGATIVE
Glucose, UA: NEGATIVE mg/dL
Ketones, ur: NEGATIVE mg/dL
Leukocytes, UA: NEGATIVE
Nitrite: NEGATIVE
Protein, ur: NEGATIVE mg/dL
Specific Gravity, Urine: 1.01 (ref 1.005–1.030)
pH: 7 (ref 5.0–8.0)

## 2015-12-16 LAB — CBC
HCT: 43.6 % (ref 39.0–52.0)
Hemoglobin: 14 g/dL (ref 13.0–17.0)
MCH: 26.5 pg (ref 26.0–34.0)
MCHC: 32.1 g/dL (ref 30.0–36.0)
MCV: 82.4 fL (ref 78.0–100.0)
Platelets: 356 10*3/uL (ref 150–400)
RBC: 5.29 MIL/uL (ref 4.22–5.81)
RDW: 15 % (ref 11.5–15.5)
WBC: 6.5 10*3/uL (ref 4.0–10.5)

## 2015-12-16 LAB — TROPONIN I: Troponin I: 0.73 ng/mL (ref <0.03)

## 2015-12-16 LAB — BASIC METABOLIC PANEL
Anion gap: 9 (ref 5–15)
BUN: 13 mg/dL (ref 6–20)
CO2: 29 mmol/L (ref 22–32)
Calcium: 9.4 mg/dL (ref 8.9–10.3)
Chloride: 101 mmol/L (ref 101–111)
Creatinine, Ser: 0.83 mg/dL (ref 0.61–1.24)
GFR calc Af Amer: >60 mL/min (ref >60)
GFR calc non Af Amer: >60 mL/min (ref >60)
Glucose, Bld: 103 mg/dL — ABNORMAL HIGH (ref 65–99)
Potassium: 3.6 mmol/L (ref 3.5–5.1)
Sodium: 139 mmol/L (ref 135–145)

## 2015-12-16 LAB — GLUCOSE, CAPILLARY: Glucose-Capillary: 141 mg/dL — ABNORMAL HIGH (ref 65–99)

## 2015-12-16 MED ORDER — ASPIRIN EC 325 MG PO TBEC: 325.0000 mg | DELAYED_RELEASE_TABLET | Freq: Once | ORAL | Status: DC

## 2015-12-16 MED ORDER — HEPARIN (PORCINE) IN NACL 100-0.45 UNIT/ML-% IJ SOLN
1550.0000 [IU]/h | INTRAMUSCULAR | Status: DC
  Administered 2015-12-16: 1550 [IU]/h via INTRAVENOUS
  Filled 2015-12-16 (×2): qty 250

## 2015-12-16 MED ORDER — NITROGLYCERIN 0.4 MG SL SUBL
0.4000 mg | SUBLINGUAL_TABLET | SUBLINGUAL | Status: AC | PRN
  Administered 2015-12-16 (×3): 0.4 mg via SUBLINGUAL
  Filled 2015-12-16: qty 1

## 2015-12-16 MED ORDER — SODIUM CHLORIDE 0.9% FLUSH: 3.0000 mL | Freq: Two times a day (BID) | INTRAVENOUS | Status: DC

## 2015-12-16 MED ORDER — HEPARIN BOLUS VIA INFUSION
4000.0000 [IU] | Freq: Once | INTRAVENOUS | Status: AC
  Administered 2015-12-16: 4000 [IU] via INTRAVENOUS
  Filled 2015-12-16: qty 4000

## 2015-12-16 MED ORDER — ASPIRIN EC 325 MG PO TBEC: 325.0000 mg | DELAYED_RELEASE_TABLET | Freq: Every day | ORAL | Status: DC

## 2015-12-16 MED ORDER — ASPIRIN EC 325 MG PO TBEC
325.0000 mg | DELAYED_RELEASE_TABLET | Freq: Every day | ORAL | Status: DC
  Administered 2015-12-17: 325 mg via ORAL
  Filled 2015-12-16: qty 1

## 2015-12-16 MED ORDER — POTASSIUM CHLORIDE CRYS ER 20 MEQ PO TBCR
40.0000 meq | EXTENDED_RELEASE_TABLET | Freq: Every morning | ORAL | Status: DC
  Administered 2015-12-17 – 2015-12-18 (×2): 40 meq via ORAL
  Filled 2015-12-16 (×2): qty 2

## 2015-12-16 MED ORDER — ENOXAPARIN SODIUM 100 MG/ML ~~LOC~~ SOLN: 100.0000 mg | Freq: Once | SUBCUTANEOUS | Status: DC

## 2015-12-16 MED ORDER — ALBUTEROL SULFATE HFA 108 (90 BASE) MCG/ACT IN AERS: 2.0000 | INHALATION_SPRAY | Freq: Four times a day (QID) | RESPIRATORY_TRACT | Status: DC | PRN

## 2015-12-16 MED ORDER — SODIUM CHLORIDE 0.9 % IV SOLN
INTRAVENOUS | Status: DC
  Administered 2015-12-17 (×2): via INTRAVENOUS

## 2015-12-16 MED ORDER — ONDANSETRON HCL 4 MG/2ML IJ SOLN: 4.0000 mg | Freq: Four times a day (QID) | INTRAMUSCULAR | Status: DC | PRN

## 2015-12-16 MED ORDER — ATORVASTATIN CALCIUM 10 MG PO TABS: 20.0000 mg | ORAL_TABLET | Freq: Every day | ORAL | Status: DC

## 2015-12-16 MED ORDER — INSULIN ASPART 100 UNIT/ML ~~LOC~~ SOLN: 0.0000 [IU] | Freq: Three times a day (TID) | SUBCUTANEOUS | Status: DC

## 2015-12-16 MED ORDER — NITROGLYCERIN 0.4 MG SL SUBL
0.4000 mg | SUBLINGUAL_TABLET | SUBLINGUAL | Status: DC | PRN
  Filled 2015-12-16: qty 1

## 2015-12-16 MED ORDER — METOPROLOL TARTRATE 25 MG PO TABS
50.0000 mg | ORAL_TABLET | Freq: Two times a day (BID) | ORAL | Status: DC
  Administered 2015-12-16 – 2015-12-18 (×4): 50 mg via ORAL
  Filled 2015-12-16 (×2): qty 1
  Filled 2015-12-16 (×2): qty 2

## 2015-12-16 MED ORDER — ASPIRIN 81 MG PO CHEW
324.0000 mg | CHEWABLE_TABLET | Freq: Once | ORAL | Status: AC
  Administered 2015-12-16: 324 mg via ORAL
  Filled 2015-12-16: qty 4

## 2015-12-16 MED ORDER — LISINOPRIL 40 MG PO TABS: 40.0000 mg | ORAL_TABLET | Freq: Every day | ORAL | 0 refills | Status: DC

## 2015-12-16 MED ORDER — ALBUTEROL SULFATE (2.5 MG/3ML) 0.083% IN NEBU: 2.5000 mg | INHALATION_SOLUTION | Freq: Four times a day (QID) | RESPIRATORY_TRACT | Status: DC | PRN

## 2015-12-16 MED ORDER — ACETAMINOPHEN 325 MG PO TABS: 650.0000 mg | ORAL_TABLET | ORAL | Status: DC | PRN

## 2015-12-16 MED ORDER — SODIUM CHLORIDE 0.9% FLUSH: 3.0000 mL | INTRAVENOUS | Status: DC | PRN

## 2015-12-16 MED ORDER — FUROSEMIDE 40 MG PO TABS
40.0000 mg | ORAL_TABLET | Freq: Two times a day (BID) | ORAL | Status: DC
  Administered 2015-12-17 – 2015-12-18 (×3): 40 mg via ORAL
  Filled 2015-12-16 (×4): qty 1

## 2015-12-16 MED ORDER — LISINOPRIL 40 MG PO TABS
40.0000 mg | ORAL_TABLET | Freq: Every day | ORAL | Status: DC
  Administered 2015-12-17 – 2015-12-18 (×2): 40 mg via ORAL
  Filled 2015-12-16: qty 2
  Filled 2015-12-16: qty 1

## 2015-12-16 MED ORDER — GLIPIZIDE 5 MG PO TABS
5.0000 mg | ORAL_TABLET | Freq: Two times a day (BID) | ORAL | Status: DC
  Administered 2015-12-17 – 2015-12-18 (×2): 5 mg via ORAL
  Filled 2015-12-16 (×2): qty 1

## 2015-12-16 MED ORDER — INSULIN ASPART 100 UNIT/ML ~~LOC~~ SOLN: 0.0000 [IU] | Freq: Every day | SUBCUTANEOUS | Status: DC

## 2015-12-16 MED ORDER — SODIUM CHLORIDE 0.9 % IV SOLN: 250.0000 mL | INTRAVENOUS | Status: DC | PRN

## 2015-12-16 NOTE — Plan of Care (Signed)
RN paged abnormal troponin. Chart reviewed. Cardiology has consulted and placed pt on appropriate meds as troponin was elevated on admission. Pt is NPO after MN for cardiac cath in am.  [facility], NP [facility]

## 2015-12-16 NOTE — Progress Notes (Signed)
Subjective:    Patient ID: Raymond Lutz, male    DOB: 1965-04-12, 50 y.o.   MRN: [id number]  HPI  Raymond Lutz is a 50 year old male who presents today for follow up of his HTN and tightness in his chest. He notes this as a 5 and aching and this has been present for > 24 hours.  He denies fever, chills, sweats,  palpitations, DOE, dizziness, or syncope at this time, N/V, Left arm pain or jaw pain.  He states that this pain is present at rest and with activity but cannot articulate if this is worse with activity.  Associated symptom of eructation is present and reports occasional LE edema when he is up on his feet working all day. He is morbidly obese.  BP monitored at home today was elevated per patient report and noted to have a diastolic value of 100. Retake of BP today is 152/92. He reports taking his medications for BP at 10:00 am this morning.   EKG obtained and compared to prior finding which remains largely unchanged at this time. EKG is noted as NSR with rate of 78.  He has a history of unspecified chest pain, PVD, dyslipidemia, GERD, and nonobstructive ASCAD.  He has seen cardiology and had an ECHO and stress test in January 2016. Stress test found that systolic function was normal and ejection fraction was in the range of 50% to 60%.  EKG in January 2016 indicated NSR with no ST changes. These tests were ordered after evaluation and identification of a systolic murmur 2/6 at LLSB and apex. He was advised to follow up in 6 months which he has not done at this time.     Review of Systems  Constitutional: Negative for chills, fatigue and fever.  HENT: Negative for congestion, postnasal drip and rhinorrhea.   Eyes: Negative for visual disturbance.  Respiratory: Positive for cough. Negative for wheezing.   Cardiovascular: Negative for palpitations and leg swelling.       Chest tightness  Gastrointestinal: Negative for abdominal pain, diarrhea, nausea and vomiting.  Musculoskeletal:  Negative for arthralgias and myalgias.  Neurological: Negative for dizziness, tremors, syncope, weakness, light-headedness, numbness and headaches.   Past Medical History:  Diagnosis Date  . Arthritis   . Asthma   . Chest pain    a. 05/2001 - Cardiolite: EF 47%, Diaphragmatic Attenuation.  . Coronary artery disease     nonobstructive ASCAD with 50% OM, 20-30% dLAD, 30-40%dRCA at cath 2013   . DIABETES MELLITUS, TYPE II 10/17/2006  . ED (erectile dysfunction)   . GERD (gastroesophageal reflux disease)   . HYPERLIPIDEMIA 10/17/2006  . HYPERTENSION 10/17/2006  . OBESITY 10/17/2006  . Paroxysmal atrial fibrillation (HCC)    2006 - was on coumadin - took himself off 6 mos after initiation.  . Shortness of breath   . Sleep apnea    not using CPAP regularly     Social History   Social History  . Marital status: Married    Spouse name: N/A  . Number of children: N/A  . Years of education: N/A   Occupational History  . Not on file.   Social History Main Topics  . Smoking status: Never Smoker  . Smokeless tobacco: Never Used  . Alcohol use No  . Drug use: No  . Sexual activity: Yes   Other Topics Concern  . 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  Lives in  [place] with wife - 2 children - 13, 10.    Past Surgical History:  Procedure Laterality Date  . arthroscopic knee    . CARDIAC CATHETERIZATION    . HERNIA REPAIR    . HERNIA REPAIR    . LEFT HEART CATHETERIZATION WITH CORONARY ANGIOGRAM N/A 05/03/2011   Procedure: LEFT HEART CATHETERIZATION WITH CORONARY ANGIOGRAM;  Surgeon: Iran OuchMuhammad A Arida, MD;  Location: [facility]ATH [facility];  Service: Cardiovascular;  Laterality: N/A;  . skin grafts      Family History  Problem Relation Age of Onset  . Diabetes Maternal Grandmother   . Hypertension Brother   . Heart attack Sister     died mid 5350's  . Heart attack Brother     died early 6450's  . Cerebral aneurysm Mother     died mid 9360's  . Lung cancer Father       died @ 2570    No Known Allergies  Current Outpatient Prescriptions on File Prior to Visit  Medication Sig Dispense Refill  . albuterol (VENTOLIN HFA) 108 (90 BASE) MCG/ACT inhaler Inhale 2 puffs into the lungs every 6 (six) hours as needed. 1 Inhaler 1  . aspirin EC 325 MG tablet Take 325 mg by mouth daily.    Marland Kitchen. atorvastatin (LIPITOR) 20 MG tablet Take 1 tablet (20 mg total) by mouth at bedtime. 30 tablet 11  . diltiazem (TAZTIA XT) 240 MG 24 hr capsule Take 1 capsule (240 mg total) by mouth daily. 30 capsule 11  . furosemide (LASIX) 40 MG tablet Take 1 tablet (40 mg total) by mouth 2 (two) times daily. 60 tablet 11  . glipiZIDE (GLUCOTROL) 5 MG tablet Take 1 tablet (5 mg total) by mouth 2 (two) times daily before a meal. 60 tablet 11  . glucose blood test strip 1 each by Other route. Use as instructed     . indomethacin (INDOCIN) 25 MG capsule TAKE ONE CAPSULE BY MOUTH TWICE DAILY WITH FOOD FOR GOUT 50 capsule 3  . lisinopril (PRINIVIL,ZESTRIL) 40 MG tablet Take 1 tablet (40 mg total) by mouth daily. 90 tablet 0  . metFORMIN (GLUCOPHAGE-XR) 750 MG 24 hr tablet Take 1 tablet (750 mg total) by mouth daily with breakfast. 30 tablet 11  . potassium chloride SA (K-DUR,KLOR-CON) 20 MEQ tablet Take 2 tablets (40 mEq total) by mouth every morning. 60 tablet 11   No current facility-administered medications on file prior to visit.     BP (!) 152/92   Pulse 83   Temp 98.2 F (36.8 C) (Oral)   Wt (!) 407 lb 12.8 oz (185 kg)   SpO2 98%   BMI 53.80 kg/m        Objective:   Physical Exam  Constitutional: He is oriented to person, place, and time. He appears well-developed and well-nourished.  Morbidly obese  Eyes: Pupils are equal, round, and reactive to light. No scleral icterus.  Neck: Neck supple.  Cardiovascular: Normal rate, regular rhythm and intact distal pulses.   Murmur heard. Systolic murmur 2/6 which is unchanged from previous cardiac workup  Pulmonary/Chest: Effort normal  and breath sounds normal. He has no wheezes. He has no rales.  Abdominal: Soft. Bowel sounds are normal. There is no tenderness.  Musculoskeletal: He exhibits no edema.  Lymphadenopathy:    He has no cervical adenopathy.  Neurological: He is alert and oriented to person, place, and time. Coordination normal.  Skin: Skin is warm.       Assessment & Plan:  1. Chest pain, unspecified type EKG indicates NSR and remains largely unchanged since previous EKG in 2013. Prior cardiac studies in 03/2014 were normal. Patient was advised to follow up with cardiology in 6 months which he has not completed at this time. While patient states that he is not SOB and O2 sat is 98%, he does not wish to sit up and chooses to lie on exam table during exam. Concern for cardiac source of this active chest pain as he has a history of known nonobstructive ASCAD by cath in 2013; advised patient that further evaluation and monitoring is warranted today.  2. SOB (shortness of breath) SOB is not present during this visit. Patient states that this "comes and goes" which is a concerning finding based upon presentation today. - EKG 12-Lead  3. Essential hypertension Retake of BP is 152/92. Patient reports taking his medication at 10:00am this morning.    4. Gastroesophageal reflux disease, esophagitis presence not specified No particular diet is followed. Patient is morbidly obese. He is not currently taking any medications for this at this time.   Discussed recommendation of further evaluation of acute chest pain due to risk factors and presentation today with patient and wife. Advised transport via EMS for continuous monitoring however patient and wife declined to go via ambulance stating they would drive to the ED at [facility]. Patient and wife voiced understanding of recommendation and decided against medical advice for ambulance;  they will seek medical attention in the ED today via vehicle per patient.  Roddie Mc, FNP-C

## 2015-12-16 NOTE — ED Notes (Signed)
Cardiologist at bedside.  

## 2015-12-16 NOTE — ED Triage Notes (Signed)
Pt reports L side CP and SOB since yesterday evening. No lightheadedness/dizziness or nausea.

## 2015-12-16 NOTE — Progress Notes (Signed)
Pre visit review using our clinic review tool, if applicable. No additional management support is needed unless otherwise documented below in the visit note. 

## 2015-12-16 NOTE — Progress Notes (Signed)
ANTICOAGULATION CONSULT NOTE - Initial Consult  Pharmacy Consult for IV heparin Indication: chest pain/ACS  No Known Allergies  Patient Measurements: Height: 6\' 1"  (185.4 cm) Weight: (!) 407 lb (184.6 kg) IBW/kg (Calculated) : 79.9 Heparin Dosing Weight: 128.7 kg  Vital Signs: Temp: 98.1 F (36.7 C) (10/05 1857) Temp Source: Oral (10/05 1857) BP: 149/89 (10/05 1857) Pulse Rate: 75 (10/05 1857)  Labs:  Recent Labs  12/16/15 1635  HGB 14.0  HCT 43.6  PLT 356  LABPROT 11.9  INR 0.88  CREATININE 0.83    Estimated Creatinine Clearance: 187.5 mL/min (by C-G formula based on SCr of 0.83 mg/dL).   Medical History: Past Medical History:  Diagnosis Date  . Arthritis   . Asthma   . Chest pain    a. 05/2001 - Cardiolite: EF 47%, Diaphragmatic Attenuation.  . Coronary artery disease     nonobstructive ASCAD with 50% OM, 20-30% dLAD, 30-40%dRCA at cath 2013   . DIABETES MELLITUS, TYPE II 10/17/2006  . ED (erectile dysfunction)   . GERD (gastroesophageal reflux disease)   . HYPERLIPIDEMIA 10/17/2006  . HYPERTENSION 10/17/2006  . OBESITY 10/17/2006  . Paroxysmal atrial fibrillation (HCC)    2006 - was on coumadin - took himself off 6 mos after initiation.  . Shortness of breath   . Sleep apnea    not using CPAP regularly    Medications:  Scheduled:  . [START ON 12/17/2015] aspirin EC  325 mg Oral Daily  . [START ON 12/17/2015] atorvastatin  20 mg Oral QHS  . furosemide  40 mg Oral BID  . [START ON 12/17/2015] glipiZIDE  5 mg Oral BID AC  . [START ON 12/17/2015] insulin aspart  0-15 Units Subcutaneous TID WC  . insulin aspart  0-5 Units Subcutaneous QHS  . [START ON 12/17/2015] lisinopril  40 mg Oral Daily  . metoprolol tartrate  50 mg Oral BID  . [START ON 12/17/2015] potassium chloride SA  40 mEq Oral q morning - 10a  . sodium chloride flush  3 mL Intravenous Q12H    Assessment: Pharmacy is consulted to dose heparin in 50 yo male with ACS. Pt PMH is significant for history  of CAD, HTN, DM, morbid obesity and strong family history of premature CAD. No noted anticoagulation on admission.   Today, 12/16/15   Hgb 14, Plt 356  PT 11.9, INR 0.88  SCr 0.83, CrCl > 100 ml/min   Goal of Therapy:  Heparin level 0.3-0.7 units/ml Monitor platelets by anticoagulation protocol: Yes   Plan:   Heparin 4000 unit bolus, followed by heparin IV 1550 units/hr  Heparin level 6 hours after start.   Monitor for signs and symptoms of bleeding  CBC ordered with AM labs  Pt tentatively scheduled for cath tommorow  Adalberto ColeNikola Lacinda Curvin, PharmD, BCPS Pager [phone] 12/16/2015 8:21 PM

## 2015-12-16 NOTE — ED Notes (Signed)
Pt can go to floor at 18:28

## 2015-12-16 NOTE — ED Notes (Signed)
I stat trop=0.66, MD Haviland notified

## 2015-12-16 NOTE — Progress Notes (Signed)
CRITICAL VALUE ALERT  Critical value received:  Troponin 0.73  Date of notification:  12/16/15  Time of notification:  2044  Critical value read back:Yes.    Nurse who received alert:  Radene Kneehloe Shron Ozer  MD notified (1st page):  Craige CottaKirby   Time of first page:  2208  MD notified (2nd page):  Time of second page:  Responding MD:  Craige CottaKirby  Time MD responded: 2211

## 2015-12-16 NOTE — Consult Note (Signed)
CARDIOLOGY CONSULT NOTE     Patient ID: Raymond Lutz MRN: [id number] DOB/AGE: [birth year] 49 y.o.  Admit date: 12/16/2015 Referring Physician Dhungel, Theda Belfast, MD Primary Physician Evette Georges, MD Primary Cardiologist Armanda Magic MD Reason for Consultation chest pain  HPI:  50 yo BM with history of CAD, HTN, DM, morbid obesity and strong family history of premature CAD presents for evaluation of chest pain. 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 He states he was doing well until yesterday evening when he developed left precordial chest pain at rest. Pain was nonradiating and descried as pressure. No diaphoresis, nausea or SOB. Did not have Ntg to take. No worsening with exertion or meals. Pain would come and go. Seen by primary care today and referred to ED. In ED he did get relief of chest pain with Ntg. States [place] and did note SOB walking but thought he was just out of shape. He has a history of CAD by cardiac cath in 2013 by Dr. Kirke Corin. He had 50% stenosis in OM and milder nonobstructive disease in LAD and RCA. In January 2016 he had a low risk myoview stress test. Echo at that time was normal. He denies any recent unusual stress or physical activity. He is compliant with medication.   Past Medical History:  Diagnosis Date  . Arthritis   . Asthma   . Chest pain    a. 05/2001 - Cardiolite: EF 47%, Diaphragmatic Attenuation.  . Coronary artery disease     nonobstructive ASCAD with 50% OM, 20-30% dLAD, 30-40%dRCA at cath 2013   . DIABETES MELLITUS, TYPE II 10/17/2006  . ED (erectile dysfunction)   . GERD (gastroesophageal reflux disease)   . HYPERLIPIDEMIA 10/17/2006  . HYPERTENSION 10/17/2006  . OBESITY 10/17/2006  . Paroxysmal atrial fibrillation (HCC)    2006 - was on coumadin - took himself off 6 mos after initiation.  . Shortness of breath   . Sleep apnea    not using CPAP regularly    Family History  Problem Relation Age of Onset  .  Diabetes Maternal Grandmother   . Hypertension Brother   . Heart attack Sister     died mid 31's  . Heart attack Brother     died early 58's  . Cerebral aneurysm Mother     died mid 29's  . Lung cancer Father     died @ 22    Social History   Social History  . Marital status: Married    Spouse name: N/A  . Number of children: N/A  . Years of education: N/A   Occupational History  . Not on file.   Social History Main Topics  . Smoking status: Never Smoker  . Smokeless tobacco: Never Used  . Alcohol use No  . Drug use: No  . Sexual activity: Yes   Other Topics Concern  . 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  Lives in [place] with wife - 2 children - 13, 10.    Past Surgical History:  Procedure Laterality Date  . arthroscopic knee    . CARDIAC CATHETERIZATION    . HERNIA REPAIR    . HERNIA REPAIR    . LEFT HEART CATHETERIZATION WITH CORONARY ANGIOGRAM N/A 05/03/2011   Procedure: LEFT HEART CATHETERIZATION WITH CORONARY ANGIOGRAM;  Surgeon: Iran Ouch, MD;  Location: [facility] CATH [facility];  Service: Cardiovascular;  Laterality: N/A;  . skin grafts  Current Meds  Medication Sig  . aspirin EC 325 MG tablet Take 325 mg by mouth daily.  Marland Kitchen atorvastatin (LIPITOR) 20 MG tablet Take 1 tablet (20 mg total) by mouth at bedtime. (Patient taking differently: Take 20 mg by mouth daily. )  . diltiazem (TAZTIA XT) 240 MG 24 hr capsule Take 1 capsule (240 mg total) by mouth daily.  . furosemide (LASIX) 40 MG tablet Take 1 tablet (40 mg total) by mouth 2 (two) times daily.  Marland Kitchen glipiZIDE (GLUCOTROL) 5 MG tablet Take 1 tablet (5 mg total) by mouth 2 (two) times daily before a meal.  . lisinopril (PRINIVIL,ZESTRIL) 40 MG tablet Take 1 tablet (40 mg total) by mouth daily.  . metFORMIN (GLUCOPHAGE-XR) 750 MG 24 hr tablet Take 1 tablet (750 mg total) by mouth daily with breakfast.  . potassium chloride SA (K-DUR,KLOR-CON) 20 MEQ tablet Take 2 tablets (40 mEq  total) by mouth every morning.    ROS: As noted in HPI. All other systems are reviewed and are negative unless otherwise mentioned.   Physical Exam: Blood pressure 156/85, pulse 76, temperature 98.1 F (36.7 C), temperature source Oral, resp. rate 11, height 6\' 1"  (1.854 m), weight (!) 407 lb (184.6 kg), SpO2 98 %. Current Weight  12/16/15 (!) 407 lb (184.6 kg)  12/16/15 (!) 407 lb 12.8 oz (185 kg)  01/06/15 (!) 397 lb (180.1 kg)    GENERAL:  Well appearing, obese BM in NAD HEENT:  PERRL, EOMI, sclera are clear. Oropharynx is clear. NECK:  No jugular venous distention, carotid upstroke brisk and symmetric, no bruits, no thyromegaly or adenopathy LUNGS:  Clear to auscultation bilaterally CHEST:  Unremarkable HEART:  RRR,  PMI not displaced or sustained,S1 and S2 within normal limits, no S3, no S4: no clicks, no rubs, no murmurs ABD:  Morbidly obese, Soft, nontender. BS +, no masses or bruits. No hepatomegaly, no splenomegaly EXT:  2 + pulses throughout, no edema, no cyanosis no clubbing SKIN:  Warm and dry.  No rashes NEURO:  Alert and oriented x 3. Cranial nerves II through XII intact. PSYCH:  Cognitively intact    Labs:   Lab Results  Component Value Date   WBC 6.5 12/16/2015   HGB 14.0 12/16/2015   HCT 43.6 12/16/2015   MCV 82.4 12/16/2015   PLT 356 12/16/2015    Recent Labs Lab 12/16/15 1635  NA 139  K 3.6  CL 101  CO2 29  BUN 13  CREATININE 0.83  CALCIUM 9.4  GLUCOSE 103*   Lab Results  Component Value Date   CKTOTAL 203 05/03/2011   CKTOTAL 211 05/03/2011   CKTOTAL 227 05/02/2011   CKMB 1.2 02/07/2014   CKMB 1.8 02/07/2014   CKMB 2.0 02/07/2014   TROPONINI < 0.02 02/07/2014   TROPONINI < 0.02 02/07/2014   TROPONINI < 0.02 02/07/2014   Lab Results  Component Value Date   CHOL 177 02/08/2014   CHOL 222 (H) 06/24/2012   CHOL 193 05/03/2011   Lab Results  Component Value Date   HDL 30 (L) 02/08/2014   HDL 35.10 (L) 06/24/2012   HDL 35 (L)  05/03/2011   Lab Results  Component Value Date   LDLCALC 118 (H) 02/08/2014   LDLCALC 115 (H) 05/03/2011   LDLCALC 130 (H) 12/08/2009   Lab Results  Component Value Date   TRIG 147 02/08/2014   TRIG 149.0 06/24/2012   TRIG 215 (H) 05/03/2011   Lab Results  Component Value Date   CHOLHDL  6 06/24/2012   CHOLHDL 5.5 05/03/2011   CHOLHDL 5 12/08/2009   Lab Results  Component Value Date   LDLDIRECT 171.0 06/24/2012    No results found for: PROBNP Lab Results  Component Value Date   TSH 1.91 02/08/2014   Lab Results  Component Value Date   HGBA1C 6.6 12/10/2015    Radiology: Dg Chest 2 View  Result Date: 12/16/2015 CLINICAL DATA:  Left chest pain and shortness of breath EXAM: CHEST  2 VIEW COMPARISON:  Chest radiograph 09/09/2014 FINDINGS: Cardiomediastinal contours are normal. No pneumothorax or pleural effusion. No focal airspace consolidation or pulmonary edema. IMPRESSION: Clear lungs. Electronically Signed   By: Deatra RobinsonKevin  Herman M.D.   On: 12/16/2015 15:46    EKG: today shows NSR with no acute change.  ASSESSMENT AND PLAN:  1. Unstable angina with probable NSTEMI. Prolonged chest pain. Initial troponin abnormal. Known nonobstructive CAD from 2013. Recommend admission to telemetry. IV heparin. Add beta blocker. Nitrates for recurrent pain. Cycle cardiac enzymes and Ecg. If subsequent troponin assay elevated I would recommend cardiac cath with possible PCI tomorrow. Tentatively placed on cath schedule. The procedure and risks were reviewed including but not limited to death, myocardial infarction, stroke, arrythmias, bleeding, transfusion, emergency surgery, dye allergy, or renal dysfunction. The patient voices understanding and is agreeable to proceed. 2. DM type 2. Plan to hold metformin. SSI. On glipizide 3. HTN continue outpatient meds. Add beta blocker. 4. Morbid obesity and OSA. CPAP therapy 5. Hyperlipidemia. On statin. Check lipid panel.     Signed: Arlen Legendre Lutz,  MDFACC  12/16/2015, 6:27 PM

## 2015-12-16 NOTE — ED Notes (Signed)
Patient reports chest pressure a 1 after second NTG.

## 2015-12-16 NOTE — H&P (Signed)
TRH H&P   Patient Demographics:    Raymond Lutz, is a 50 y.o. male  MRN: [id number]   DOB - December 31, 1965  Admit Date - 12/16/2015  Outpatient Primary MD for the patient is TODD,JEFFREY Freida Busman, MD  Referring MD: Dr Particia Nearing  Outpatient Specialists:  Dr Mayford Knife   Patient coming from: Home  Chief Complaint  Patient presents with  . Chest Pain  . Shortness of Breath      HPI:    Raymond Lutz  is a 50 y.o. male, with history of coronary artery disease with cardiac cath in 2013 showing nonobstructive CAD. He had a low-risk nuclear stress test done in January 2016. On the medical history includes type 2 diabetes mellitus on oral hypoglycemics, hypertension, morbid obesity, OSA on CPAP (nonadherent), hyperlipidemia, paroxysmal A. fib (not on anticoagulation, GERD. Patient was in his usual state of health when he experienced sudden pressure-like pain on his left side of the chest while at work last evening (he works as a Production designer, theatre/television/film in [employer]). He reports pain worsened with minimal exertion, felt like something was sitting on his chest, 7/10 in severity and nonradiating. Pain was off and on, lasting about 10-15 minutes in maximum duration. He was able to sleep through the night but again started having similar pain this morning 8/10 and lasting for similar duration. He informs taking his medications this morning. He called his PCP and was told to come to the ED. He informs having similar pain in the past but not this severe. He denies any chest trauma or lifting heavy weights. [place] past 2 weeks. Denies any shortness of breath, palpitations, orthopnea, PND, headache, dizziness, nausea, vomiting, fevers, chills, abdominal pain, dysuria, bowel symptoms, weakness in his extremities. He has chronic trace pedal edema. In the ED vitals were stable.  Blood work was unremarkable except for i-STAT troponin elevated at 0.66. EKG showed normal sinus rhythm at 76 with no ST-T changes. Chest x-ray was unremarkable. Patient given 3 rounds of sublingual nitroglycerin with his pain improved from 5 down to 1.  At baseline he walks almost a couple of miles at work.  Cardiology consulted and recommended admission to hospitalist service and will evaluate patient this evening.    Review of systems:    In addition to the HPI above,  No Fever-chills, No Headache, No changes with Vision or hearing, No problems swallowing food or Liquids, Chest pain, Denies Cough or Shortness of Breath, No Abdominal pain, No Nausea or Vommitting, Bowel movements are regular, No Blood in stool or Urine, No dysuria, No new skin rashes or bruises, No new joints pains-aches,  No new weakness, tingling, numbness in any extremity, No recent weight gain or loss, No polyuria, polydypsia or polyphagia, No significant Mental Stressors.  A full 10 point Review of Systems was done, except  as stated above, all other Review of Systems were negative.   With Past History of the following :    Past Medical History:  Diagnosis Date  . Arthritis   . Asthma   . Chest pain    a. 05/2001 - Cardiolite: EF 47%, Diaphragmatic Attenuation.  . Coronary artery disease     nonobstructive ASCAD with 50% OM, 20-30% dLAD, 30-40%dRCA at cath 2013   . DIABETES MELLITUS, TYPE II 10/17/2006  . ED (erectile dysfunction)   . GERD (gastroesophageal reflux disease)   . HYPERLIPIDEMIA 10/17/2006  . HYPERTENSION 10/17/2006  . OBESITY 10/17/2006  . Paroxysmal atrial fibrillation (HCC)    2006 - was on coumadin - took himself off 6 mos after initiation.  . Shortness of breath   . Sleep apnea    not using CPAP regularly      Past Surgical History:  Procedure Laterality Date  . arthroscopic knee    . CARDIAC CATHETERIZATION    . HERNIA REPAIR    . HERNIA REPAIR    . LEFT HEART CATHETERIZATION  WITH CORONARY ANGIOGRAM N/A 05/03/2011   Procedure: LEFT HEART CATHETERIZATION WITH CORONARY ANGIOGRAM;  Surgeon: Iran OuchMuhammad A Arida, MD;  Location: [facility] CATH [facility];  Service: Cardiovascular;  Laterality: N/A;  . skin grafts        Social History:     Social History  Substance Use Topics  . Smoking status: Never Smoker  . Smokeless tobacco: Never Used  . Alcohol use No     Lives - At home with wife  Mobility - independent    Family History :     Family History  Problem Relation Age of Onset  . Diabetes Maternal Grandmother   . Hypertension Brother   . Heart attack Sister     died mid 4250's  . Heart attack Brother     died early 7050's  . Cerebral aneurysm Mother     died mid 6960's  . Lung cancer Father     died @ 7370      Home Medications:   Prior to Admission medications   Medication Sig Start Date End Date Taking? Authorizing Provider  aspirin EC 325 MG tablet Take 325 mg by mouth daily.   Yes Historical Provider, MD  atorvastatin (LIPITOR) 20 MG tablet Take 1 tablet (20 mg total) by mouth at bedtime. Patient taking differently: Take 20 mg by mouth daily.  12/10/15  Yes Shirline Freesory Nafziger, NP  diltiazem (TAZTIA XT) 240 MG 24 hr capsule Take 1 capsule (240 mg total) by mouth daily. 12/10/15  Yes Shirline Freesory Nafziger, NP  furosemide (LASIX) 40 MG tablet Take 1 tablet (40 mg total) by mouth 2 (two) times daily. 12/10/15  Yes Shirline Freesory Nafziger, NP  glipiZIDE (GLUCOTROL) 5 MG tablet Take 1 tablet (5 mg total) by mouth 2 (two) times daily before a meal. 12/10/15  Yes Shirline Freesory Nafziger, NP  lisinopril (PRINIVIL,ZESTRIL) 40 MG tablet Take 1 tablet (40 mg total) by mouth daily. 12/16/15  Yes Roderick PeeJeffrey A Todd, MD  metFORMIN (GLUCOPHAGE-XR) 750 MG 24 hr tablet Take 1 tablet (750 mg total) by mouth daily with breakfast. 12/10/15  Yes Shirline Freesory Nafziger, NP  potassium chloride SA (K-DUR,KLOR-CON) 20 MEQ tablet Take 2 tablets (40 mEq total) by mouth every morning. 12/10/15  Yes Shirline Freesory Nafziger, NP  albuterol (VENTOLIN HFA)  108 (90 BASE) MCG/ACT inhaler Inhale 2 puffs into the lungs every 6 (six) hours as needed. 09/09/14   Madelin HeadingsWanda K Panosh, MD  BOOSTRIX 5-2.5-18.5 injection  09/10/15   Historical Provider, MD  glucose blood test strip 1 each by Other route. Use as instructed     Historical Provider, MD  indomethacin (INDOCIN) 25 MG capsule TAKE ONE CAPSULE BY MOUTH TWICE DAILY WITH FOOD FOR GOUT Patient not taking: Reported on 12/16/2015 11/18/13   Roderick Pee, MD     Allergies:    No Known Allergies   Physical Exam:   Vitals  Blood pressure 156/85, pulse 76, temperature 98.1 F (36.7 C), temperature source Oral, resp. rate 11, height 6\' 1"  (1.854 m), weight (!) 184.6 kg (407 lb), SpO2 98 %.   General: Middle aged obese male lying in bed not in distress HEENT: No pallor, moist mucosa, supple neck, no JVD Chest: Clear to auscultation bilaterally, no added sounds CVS: Normal S1 and S2, no murmurs or gallop GI: Soft, nondistended, nontender, bowel sounds present Musculoskeletal: Warm, trace edema CNS: Alert and oriented     Data Review:    CBC  Recent Labs Lab 12/16/15 1635  WBC 6.5  HGB 14.0  HCT 43.6  PLT 356  MCV 82.4  MCH 26.5  MCHC 32.1  RDW 15.0   ------------------------------------------------------------------------------------------------------------------  Chemistries   Recent Labs Lab 12/10/15 1007 12/16/15 1635  NA 141 139  K 4.0 3.6  CL 104 101  CO2 32 29  GLUCOSE 109* 103*  BUN 13 13  CREATININE 0.81 0.83  CALCIUM 9.0 9.4   ------------------------------------------------------------------------------------------------------------------ estimated creatinine clearance is 187.5 mL/min (by C-G formula based on SCr of 0.83 mg/dL). ------------------------------------------------------------------------------------------------------------------ No results for input(s): TSH, T4TOTAL, T3FREE, THYROIDAB in the last 72 hours.  Invalid input(s): FREET3  Coagulation  profile No results for input(s): INR, PROTIME in the last 168 hours. ------------------------------------------------------------------------------------------------------------------- No results for input(s): DDIMER in the last 72 hours. -------------------------------------------------------------------------------------------------------------------  Cardiac Enzymes No results for input(s): CKMB, TROPONINI, MYOGLOBIN in the last 168 hours.  Invalid input(s): CK ------------------------------------------------------------------------------------------------------------------ No results found for: BNP   ---------------------------------------------------------------------------------------------------------------  Urinalysis    Component Value Date/Time   COLORURINE YELLOW 12/16/2015 1616   APPEARANCEUR CLEAR 12/16/2015 1616   LABSPEC 1.010 12/16/2015 1616   PHURINE 7.0 12/16/2015 1616   GLUCOSEU NEGATIVE 12/16/2015 1616   HGBUR SMALL (A) 12/16/2015 1616   HGBUR trace-lysed 12/08/2009 0940   BILIRUBINUR NEGATIVE 12/16/2015 1616   BILIRUBINUR n 06/24/2012 1204   KETONESUR NEGATIVE 12/16/2015 1616   PROTEINUR NEGATIVE 12/16/2015 1616   UROBILINOGEN 0.2 06/24/2012 1204   UROBILINOGEN 0.2 12/08/2009 0940   NITRITE NEGATIVE 12/16/2015 1616   LEUKOCYTESUR NEGATIVE 12/16/2015 1616    ----------------------------------------------------------------------------------------------------------------   Imaging Results:    Dg Chest 2 View  Result Date: 12/16/2015 CLINICAL DATA:  Left chest pain and shortness of breath EXAM: CHEST  2 VIEW COMPARISON:  Chest radiograph 09/09/2014 FINDINGS: Cardiomediastinal contours are normal. No pneumothorax or pleural effusion. No focal airspace consolidation or pulmonary edema. IMPRESSION: Clear lungs. Electronically Signed   By: Deatra Robinson M.D.   On: 12/16/2015 15:46    My personal review of EKG: Sinus rhythm at 76, no ST-T changes.    Assessment & Plan:    Principal Problem:   NSTEMI (non-ST elevated myocardial infarction) (HCC) Admit to telemetry.  Cycle serial troponins and EKG. ASA 325 mg 1 stat. I'll start him on IV heparin for anticoagulation. 2-D echo. Sublingual nitroglycerin when necessary. Add metoprolol 25 mg twice a day. Continue Lipitor. Cardiology consulted and will follow up with further recommendations.  Active Problems:   DM II (diabetes mellitus, type II), controlled (  HCC) Continue glipizide. Hold metformin. Monitor on sliding scale coverage.    Hyperlipidemia Continue Lipitor. Check lipid panel in a.m.    Essential hypertension Stable. Hold Cardizem since patient started on metoprolol. Continue Lasix and ACE inhibitor.    GERD (gastroesophageal reflux disease) Continue PPI    CAD (coronary artery disease) Nonobstructive disease on cardiac cath in 2013. Plan as above  OSA Nonadherent to CPAP. Will order while patient here..  Morbid obesity Needs counseling on diet and exercise.   DVT Prophylaxis IV heparin  AM Labs Ordered, also please review Full Orders  Family Communication: Admission, patients condition and plan of care including tests being ordered Discussed with patient and his wife in details at bedside.  Code Status full code  Likely DC to  home  Condition GUARDED  Consults called: Cardiology  Admission status: Inpatient  Time spent in minutes : 70   Eddie North M.D on 12/16/2015 at 6:15 PM  Between 7am to 7pm - Pager - [phone]. After 7pm go to [email or web address] - password [facility]  [facility] - Office  [phone]

## 2015-12-16 NOTE — ED Notes (Signed)
Report given to 4 west.  Patient transported upstairs by Charity fundraiserN.

## 2015-12-16 NOTE — ED Provider Notes (Signed)
[facility]-EMERGENCY DEPT Provider Note   CSN: [id number] Arrival date & time: 12/16/15  1453     History   Chief Complaint Chief Complaint  Patient presents with  . Chest Pain  . Shortness of Breath    HPI Raymond Lutz is a 50 y.o. male.  Pt said that he has had left sided chest pain and sob since last night.  He feels like something is standing on his chest.  Pt takes asa and did take his asa today.  The pt's last stress was 03/25/14.  It was a nuclear medicine stress which showed:    Low risk stress nuclear study with mildly decreased overall LVEF. No prior infarct or ischemia. .  LV Ejection Fraction: 46%.  LV Wall Motion:  Mild diffuse hypokinesis.  This is unchanged from the prior study in 2003.  Pt's last cath was in 2013 which showed nonobstructive CAD.       Past Medical History:  Diagnosis Date  . Arthritis   . Asthma   . Chest pain    a. 05/2001 - Cardiolite: EF 47%, Diaphragmatic Attenuation.  . Coronary artery disease     nonobstructive ASCAD with 50% OM, 20-30% dLAD, 30-40%dRCA at cath 2013   . DIABETES MELLITUS, TYPE II 10/17/2006  . ED (erectile dysfunction)   . GERD (gastroesophageal reflux disease)   . HYPERLIPIDEMIA 10/17/2006  . HYPERTENSION 10/17/2006  . OBESITY 10/17/2006  . Paroxysmal atrial fibrillation (HCC)    2006 - was on coumadin - took himself off 6 mos after initiation.  . Shortness of breath   . Sleep apnea    not using CPAP regularly    Patient Active Problem List   Diagnosis Date Noted  . NSTEMI (non-ST elevated myocardial infarction) (HCC) 12/16/2015  . Chest pain 03/18/2014  . Bilateral foot pain 07/16/2013  . Carotid bruit 05/17/2011  . CAD (coronary artery disease) 05/04/2011  . 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  . GERD (gastroesophageal reflux disease)   . KNEE PAIN, LEFT, ACUTE 12/27/2009  . DM II (diabetes mellitus, type II), controlled (HCC) 10/17/2006  . Hyperlipidemia 10/17/2006  . Obesity 10/17/2006  . Essential hypertension  10/17/2006  . ASTHMA, CHILDHOOD 10/17/2006    Past Surgical History:  Procedure Laterality Date  . arthroscopic knee    . CARDIAC CATHETERIZATION    . HERNIA REPAIR    . HERNIA REPAIR    . LEFT HEART CATHETERIZATION WITH CORONARY ANGIOGRAM N/A 05/03/2011   Procedure: LEFT HEART CATHETERIZATION WITH CORONARY ANGIOGRAM;  Surgeon: Iran Ouch, MD;  Location: [facility] CATH LAB;  Service: Cardiovascular;  Laterality: N/A;  . skin grafts         Home Medications    Prior to Admission medications   Medication Sig Start Date End Date Taking? Authorizing Provider  aspirin EC 325 MG tablet Take 325 mg by mouth daily.   Yes Historical Provider, MD  atorvastatin (LIPITOR) 20 MG tablet Take 1 tablet (20 mg total) by mouth at bedtime. Patient taking differently: Take 20 mg by mouth daily.  12/10/15  Yes Shirline Frees, NP  diltiazem (TAZTIA XT) 240 MG 24 hr capsule Take 1 capsule (240 mg total) by mouth daily. 12/10/15  Yes Shirline Frees, NP  furosemide (LASIX) 40 MG tablet Take 1 tablet (40 mg total) by mouth 2 (two) times daily. 12/10/15  Yes Shirline Frees, NP  glipiZIDE (GLUCOTROL) 5 MG tablet Take 1 tablet (5 mg total) by mouth 2 (two) times daily before a meal. 12/10/15  Yes  Shirline Freesory Nafziger, NP  lisinopril (PRINIVIL,ZESTRIL) 40 MG tablet Take 1 tablet (40 mg total) by mouth daily. 12/16/15  Yes Roderick PeeJeffrey A Todd, MD  metFORMIN (GLUCOPHAGE-XR) 750 MG 24 hr tablet Take 1 tablet (750 mg total) by mouth daily with breakfast. 12/10/15  Yes Shirline Freesory Nafziger, NP  potassium chloride SA (K-DUR,KLOR-CON) 20 MEQ tablet Take 2 tablets (40 mEq total) by mouth every morning. 12/10/15  Yes Shirline Freesory Nafziger, NP  albuterol (VENTOLIN HFA) 108 (90 BASE) MCG/ACT inhaler Inhale 2 puffs into the lungs every 6 (six) hours as needed. 09/09/14   Madelin HeadingsWanda K Panosh, MD  BOOSTRIX 5-2.5-18.5 injection  09/10/15   Historical Provider, MD  glucose blood test strip 1 each by Other route. Use as instructed     Historical Provider, MD  indomethacin  (INDOCIN) 25 MG capsule TAKE ONE CAPSULE BY MOUTH TWICE DAILY WITH FOOD FOR GOUT Patient not taking: Reported on 12/16/2015 11/18/13   Roderick PeeJeffrey A Todd, MD    Family History Family History  Problem Relation Age of Onset  . Diabetes Maternal Grandmother   . Hypertension Brother   . Heart attack Sister     died mid 3650's  . Heart attack Brother     died early 7550's  . Cerebral aneurysm Mother     died mid 3460's  . Lung cancer Father     died @ 6470    Social History Social History  Substance Use Topics  . Smoking status: Never Smoker  . Smokeless tobacco: Never Used  . Alcohol use No     Allergies   Review of patient's allergies indicates no known allergies.   Review of Systems Review of Systems  Respiratory: Positive for shortness of breath.   Cardiovascular: Positive for chest pain.  All other systems reviewed and are negative.    Physical Exam Updated Vital Signs BP 156/85   Pulse 76   Temp 98.1 F (36.7 C) (Oral)   Resp 11   Ht 6\' 1"  (1.854 m)   Wt (!) 407 lb (184.6 kg)   SpO2 98%   BMI 53.70 kg/m   Physical Exam  Constitutional: He is oriented to person, place, and time. He appears well-developed and well-nourished.  HENT:  Head: Normocephalic and atraumatic.  Right Ear: External ear normal.  Left Ear: External ear normal.  Nose: Nose normal.  Mouth/Throat: Oropharynx is clear and moist.  Eyes: Conjunctivae and EOM are normal. Pupils are equal, round, and reactive to light.  Neck: Normal range of motion. Neck supple.  Cardiovascular: Normal rate, regular rhythm, normal heart sounds and intact distal pulses.   Pulmonary/Chest: Effort normal and breath sounds normal.  Abdominal: Soft. Bowel sounds are normal.  Musculoskeletal: Normal range of motion.  Neurological: He is alert and oriented to person, place, and time.  Skin: Skin is warm.  Psychiatric: He has a normal mood and affect. His behavior is normal. Judgment and thought content normal.  Nursing  note and vitals reviewed.    ED Treatments / Results  Labs (all labs ordered are listed, but only abnormal results are displayed) Labs Reviewed  BASIC METABOLIC PANEL - Abnormal; Notable for the following:       Result Value   Glucose, Bld 103 (*)    All other components within normal limits  URINALYSIS, ROUTINE W REFLEX MICROSCOPIC (NOT [facility]) - Abnormal; Notable for the following:    Hgb urine dipstick SMALL (*)    All other components within normal limits  I-STAT TROPOININ, ED -  Abnormal; Notable for the following:    Troponin i, poc 0.66 (*)    All other components within normal limits  CBC  URINE MICROSCOPIC-ADD ON    EKG  EKG Interpretation  Date/Time:  Thursday December 16 2015 15:02:07 EDT Ventricular Rate:  76 PR Interval:    QRS Duration: 111 QT Interval:  409 QTC Calculation: 460 R Axis:   -27 Text Interpretation:  Sinus rhythm Borderline left axis deviation RSR' in V1 or V2, probably normal variant Confirmed by [facility] MD, Kateria Cutrona ([id number]) on 12/16/2015 4:11:06 PM       Radiology Dg Chest 2 View  Result Date: 12/16/2015 CLINICAL DATA:  Left chest pain and shortness of breath EXAM: CHEST  2 VIEW COMPARISON:  Chest radiograph 09/09/2014 FINDINGS: Cardiomediastinal contours are normal. No pneumothorax or pleural effusion. No focal airspace consolidation or pulmonary edema. IMPRESSION: Clear lungs. Electronically Signed   By: Deatra Robinson M.D.   On: 12/16/2015 15:46    Procedures Procedures (including critical care time)  Medications Ordered in ED Medications  nitroGLYCERIN (NITROSTAT) SL tablet 0.4 mg (0.4 mg Sublingual Given 12/16/15 1758)     Initial Impression / Assessment and Plan / ED Course  I have reviewed the triage vital signs and the nursing notes.  Pertinent labs & imaging results that were available during my care of the patient were reviewed by me and considered in my medical decision making (see chart for details).  Clinical Course    Pt's cp improved after 2 nitro.  He was d/w Dr. Anne Fu (cards) who said that Dr. [place] will be by to see pt later today.  They will probably cath pt tomorrow.  He requests that pt be admitted to hospitalists to cycle troponin.  The pt d/w Dr. Gonzella Lex ([facility]) who will admit pt.  Final Clinical Impressions(s) / ED Diagnoses   Final diagnoses:  ACS (acute coronary syndrome) [facility])    New Prescriptions New Prescriptions   No medications on file     Jacalyn Lefevre, MD 12/24/15 1550

## 2015-12-16 NOTE — Patient Instructions (Signed)
Concern for cardiac source of this discomfort, please seek medical attention as you have declined to go via ambulance.

## 2015-12-17 ENCOUNTER — Encounter (HOSPITAL_COMMUNITY)
Admission: EM | Disposition: A | Discharge status: Home / Self Care | Payer: Self-pay | Source: Home / Self Care | Attending: Internal Medicine

## 2015-12-17 ENCOUNTER — Observation Stay (HOSPITAL_COMMUNITY): Payer: BLUE CROSS/BLUE SHIELD

## 2015-12-17 DIAGNOSIS — G4733 Obstructive sleep apnea (adult) (pediatric): Secondary | ICD-10-CM | POA: Diagnosis present

## 2015-12-17 DIAGNOSIS — I10 Essential (primary) hypertension: Secondary | ICD-10-CM | POA: Diagnosis not present

## 2015-12-17 DIAGNOSIS — Z9119 Patient's noncompliance with other medical treatment and regimen: Secondary | ICD-10-CM | POA: Diagnosis not present

## 2015-12-17 DIAGNOSIS — E785 Hyperlipidemia, unspecified: Secondary | ICD-10-CM

## 2015-12-17 DIAGNOSIS — Z6841 Body Mass Index (BMI) 40.0 and over, adult: Secondary | ICD-10-CM | POA: Diagnosis not present

## 2015-12-17 DIAGNOSIS — I2510 Atherosclerotic heart disease of native coronary artery without angina pectoris: Secondary | ICD-10-CM

## 2015-12-17 DIAGNOSIS — E784 Other hyperlipidemia: Secondary | ICD-10-CM | POA: Diagnosis not present

## 2015-12-17 DIAGNOSIS — Z7982 Long term (current) use of aspirin: Secondary | ICD-10-CM | POA: Diagnosis not present

## 2015-12-17 DIAGNOSIS — I369 Nonrheumatic tricuspid valve disorder, unspecified: Secondary | ICD-10-CM | POA: Diagnosis not present

## 2015-12-17 DIAGNOSIS — Z7984 Long term (current) use of oral hypoglycemic drugs: Secondary | ICD-10-CM | POA: Diagnosis not present

## 2015-12-17 DIAGNOSIS — I25110 Atherosclerotic heart disease of native coronary artery with unstable angina pectoris: Secondary | ICD-10-CM | POA: Diagnosis present

## 2015-12-17 DIAGNOSIS — I249 Acute ischemic heart disease, unspecified: Secondary | ICD-10-CM

## 2015-12-17 DIAGNOSIS — E118 Type 2 diabetes mellitus with unspecified complications: Secondary | ICD-10-CM | POA: Diagnosis not present

## 2015-12-17 DIAGNOSIS — R0789 Other chest pain: Secondary | ICD-10-CM | POA: Diagnosis present

## 2015-12-17 DIAGNOSIS — K219 Gastro-esophageal reflux disease without esophagitis: Secondary | ICD-10-CM | POA: Diagnosis not present

## 2015-12-17 DIAGNOSIS — Z23 Encounter for immunization: Secondary | ICD-10-CM | POA: Diagnosis not present

## 2015-12-17 DIAGNOSIS — R600 Localized edema: Secondary | ICD-10-CM | POA: Diagnosis present

## 2015-12-17 DIAGNOSIS — E119 Type 2 diabetes mellitus without complications: Secondary | ICD-10-CM | POA: Diagnosis not present

## 2015-12-17 DIAGNOSIS — I517 Cardiomegaly: Secondary | ICD-10-CM | POA: Diagnosis present

## 2015-12-17 DIAGNOSIS — I214 Non-ST elevation (NSTEMI) myocardial infarction: Secondary | ICD-10-CM | POA: Diagnosis not present

## 2015-12-17 DIAGNOSIS — Z8249 Family history of ischemic heart disease and other diseases of the circulatory system: Secondary | ICD-10-CM | POA: Diagnosis not present

## 2015-12-17 HISTORY — PX: CARDIAC CATHETERIZATION: SHX172

## 2015-12-17 LAB — CBC
HCT: 40.8 % (ref 39.0–52.0)
Hemoglobin: 13.3 g/dL (ref 13.0–17.0)
MCH: 26.9 pg (ref 26.0–34.0)
MCHC: 32.6 g/dL (ref 30.0–36.0)
MCV: 82.6 fL (ref 78.0–100.0)
Platelets: 338 10*3/uL (ref 150–400)
RBC: 4.94 MIL/uL (ref 4.22–5.81)
RDW: 14.9 % (ref 11.5–15.5)
WBC: 9.2 10*3/uL (ref 4.0–10.5)

## 2015-12-17 LAB — BASIC METABOLIC PANEL
Anion gap: 8 (ref 5–15)
BUN: 15 mg/dL (ref 6–20)
CO2: 28 mmol/L (ref 22–32)
Calcium: 9.4 mg/dL (ref 8.9–10.3)
Chloride: 101 mmol/L (ref 101–111)
Creatinine, Ser: 0.86 mg/dL (ref 0.61–1.24)
GFR calc Af Amer: >60 mL/min (ref >60)
GFR calc non Af Amer: >60 mL/min (ref >60)
Glucose, Bld: 155 mg/dL — ABNORMAL HIGH (ref 65–99)
Potassium: 3.6 mmol/L (ref 3.5–5.1)
Sodium: 137 mmol/L (ref 135–145)

## 2015-12-17 LAB — LIPID PANEL
Cholesterol: 186 mg/dL (ref 0–200)
HDL: 35 mg/dL — ABNORMAL LOW (ref >40)
LDL Cholesterol: 120 mg/dL — ABNORMAL HIGH (ref 0–99)
Total CHOL/HDL Ratio: 5.3 RATIO
Triglycerides: 154 mg/dL — ABNORMAL HIGH (ref <150)
VLDL: 31 mg/dL (ref 0–40)

## 2015-12-17 LAB — HEPARIN LEVEL (UNFRACTIONATED)
Heparin Unfractionated: 0.38 IU/mL (ref 0.30–0.70)
Heparin Unfractionated: 0.43 IU/mL (ref 0.30–0.70)

## 2015-12-17 LAB — GLUCOSE, CAPILLARY
Glucose-Capillary: 151 mg/dL — ABNORMAL HIGH (ref 65–99)
Glucose-Capillary: 129 mg/dL — ABNORMAL HIGH (ref 65–99)
Glucose-Capillary: 109 mg/dL — ABNORMAL HIGH (ref 65–99)
Glucose-Capillary: 86 mg/dL (ref 65–99)

## 2015-12-17 LAB — TROPONIN I
Troponin I: 1.04 ng/mL (ref <0.03)
Troponin I: 1 ng/mL (ref <0.03)

## 2015-12-17 SURGERY — LEFT HEART CATH AND CORONARY ANGIOGRAPHY
Anesthesia: LOCAL

## 2015-12-17 MED ORDER — LIDOCAINE HCL (PF) 1 % IJ SOLN
INTRAMUSCULAR | Status: DC | PRN
  Administered 2015-12-17: 2 mL

## 2015-12-17 MED ORDER — LIDOCAINE HCL (PF) 1 % IJ SOLN
INTRAMUSCULAR | Status: AC
  Filled 2015-12-17: qty 30

## 2015-12-17 MED ORDER — NITROGLYCERIN 1 MG/10 ML FOR IR/CATH LAB
INTRA_ARTERIAL | Status: DC | PRN
  Administered 2015-12-17 (×2): 200 ug via INTRA_ARTERIAL

## 2015-12-17 MED ORDER — IOPAMIDOL (ISOVUE-370) INJECTION 76%
INTRAVENOUS | Status: AC
  Filled 2015-12-17: qty 100

## 2015-12-17 MED ORDER — IOPAMIDOL (ISOVUE-370) INJECTION 76%
INTRAVENOUS | Status: DC | PRN
  Administered 2015-12-17: 240 mL via INTRA_ARTERIAL

## 2015-12-17 MED ORDER — INFLUENZA VAC SPLIT QUAD 0.5 ML IM SUSY
0.5000 mL | PREFILLED_SYRINGE | INTRAMUSCULAR | Status: AC
  Administered 2015-12-18: 0.5 mL via INTRAMUSCULAR
  Filled 2015-12-17: qty 0.5

## 2015-12-17 MED ORDER — FENTANYL CITRATE (PF) 100 MCG/2ML IJ SOLN
INTRAMUSCULAR | Status: AC
  Filled 2015-12-17: qty 2

## 2015-12-17 MED ORDER — HEPARIN (PORCINE) IN NACL 100-0.45 UNIT/ML-% IJ SOLN
1650.0000 [IU]/h | INTRAMUSCULAR | Status: DC
  Administered 2015-12-17: 1650 [IU]/h via INTRAVENOUS

## 2015-12-17 MED ORDER — TICAGRELOR 90 MG PO TABS
90.0000 mg | ORAL_TABLET | Freq: Two times a day (BID) | ORAL | Status: DC
  Administered 2015-12-18 (×2): 90 mg via ORAL
  Filled 2015-12-17: qty 1

## 2015-12-17 MED ORDER — TICAGRELOR 90 MG PO TABS
ORAL_TABLET | ORAL | Status: AC
  Filled 2015-12-17: qty 2

## 2015-12-17 MED ORDER — MIDAZOLAM HCL 2 MG/2ML IJ SOLN
INTRAMUSCULAR | Status: AC
  Filled 2015-12-17: qty 2

## 2015-12-17 MED ORDER — ASPIRIN 81 MG PO CHEW
81.0000 mg | CHEWABLE_TABLET | Freq: Every day | ORAL | Status: DC
  Administered 2015-12-18: 81 mg via ORAL
  Filled 2015-12-17: qty 1

## 2015-12-17 MED ORDER — SODIUM CHLORIDE 0.9% FLUSH: 3.0000 mL | Freq: Two times a day (BID) | INTRAVENOUS | Status: DC

## 2015-12-17 MED ORDER — ATORVASTATIN CALCIUM 40 MG PO TABS
40.0000 mg | ORAL_TABLET | Freq: Every day | ORAL | Status: DC
  Administered 2015-12-17: 40 mg via ORAL
  Filled 2015-12-17: qty 1

## 2015-12-17 MED ORDER — SODIUM CHLORIDE 0.9 % IV SOLN: INTRAVENOUS | Status: AC

## 2015-12-17 MED ORDER — HEPARIN SODIUM (PORCINE) 1000 UNIT/ML IJ SOLN
INTRAMUSCULAR | Status: AC
  Filled 2015-12-17: qty 1

## 2015-12-17 MED ORDER — VERAPAMIL HCL 2.5 MG/ML IV SOLN
INTRAVENOUS | Status: AC
  Filled 2015-12-17: qty 2

## 2015-12-17 MED ORDER — SODIUM CHLORIDE 0.9 % IV SOLN: 250.0000 mL | INTRAVENOUS | Status: DC | PRN

## 2015-12-17 MED ORDER — ACTIVE PARTNERSHIP FOR HEALTH OF YOUR HEART BOOK
Freq: Once | Status: AC
  Administered 2015-12-17: 21:00:00
  Filled 2015-12-17: qty 1

## 2015-12-17 MED ORDER — TICAGRELOR 90 MG PO TABS
ORAL_TABLET | ORAL | Status: DC | PRN
  Administered 2015-12-17: 180 mg via ORAL

## 2015-12-17 MED ORDER — HEPARIN (PORCINE) IN NACL 2-0.9 UNIT/ML-% IJ SOLN
INTRAMUSCULAR | Status: AC
  Filled 2015-12-17: qty 1000

## 2015-12-17 MED ORDER — HEPARIN (PORCINE) IN NACL 2-0.9 UNIT/ML-% IJ SOLN
INTRAMUSCULAR | Status: DC | PRN
  Administered 2015-12-17: 1000 mL

## 2015-12-17 MED ORDER — HEART ATTACK BOUNCING BOOK
Freq: Once | Status: AC
  Administered 2015-12-17: 21:00:00
  Filled 2015-12-17: qty 1

## 2015-12-17 MED ORDER — NITROGLYCERIN 2 % TD OINT
0.5000 [in_us] | TOPICAL_OINTMENT | Freq: Once | TRANSDERMAL | Status: DC
  Filled 2015-12-17: qty 30

## 2015-12-17 MED ORDER — VERAPAMIL HCL 2.5 MG/ML IV SOLN
INTRAVENOUS | Status: DC | PRN
  Administered 2015-12-17: 10 mL via INTRA_ARTERIAL

## 2015-12-17 MED ORDER — ANGIOPLASTY BOOK
Freq: Once | Status: AC
  Administered 2015-12-17: 21:00:00
  Filled 2015-12-17: qty 1

## 2015-12-17 MED ORDER — THE SENSUOUS HEART BOOK
Freq: Once | Status: AC
  Administered 2015-12-17: 21:00:00
  Filled 2015-12-17: qty 1

## 2015-12-17 MED ORDER — MIDAZOLAM HCL 2 MG/2ML IJ SOLN
INTRAMUSCULAR | Status: DC | PRN
  Administered 2015-12-17: 2 mg via INTRAVENOUS
  Administered 2015-12-17: 1 mg via INTRAVENOUS

## 2015-12-17 MED ORDER — TICAGRELOR 90 MG PO TABS: 90.0000 mg | ORAL_TABLET | Freq: Two times a day (BID) | ORAL | Status: DC

## 2015-12-17 MED ORDER — NITROGLYCERIN 1 MG/10 ML FOR IR/CATH LAB
INTRA_ARTERIAL | Status: AC
  Filled 2015-12-17: qty 10

## 2015-12-17 MED ORDER — HEPARIN SODIUM (PORCINE) 1000 UNIT/ML IJ SOLN
INTRAMUSCULAR | Status: DC | PRN
  Administered 2015-12-17: 4000 [IU] via INTRAVENOUS
  Administered 2015-12-17: 9000 [IU] via INTRAVENOUS
  Administered 2015-12-17: 4000 [IU] via INTRAVENOUS

## 2015-12-17 MED ORDER — FENTANYL CITRATE (PF) 100 MCG/2ML IJ SOLN
INTRAMUSCULAR | Status: DC | PRN
Start: 2015-12-17 — End: 2015-12-17
  Administered 2015-12-17: 50 ug via INTRAVENOUS
  Administered 2015-12-17: 25 ug via INTRAVENOUS

## 2015-12-17 MED ORDER — IOPAMIDOL (ISOVUE-370) INJECTION 76%
INTRAVENOUS | Status: AC
  Filled 2015-12-17: qty 50

## 2015-12-17 MED ORDER — SODIUM CHLORIDE 0.9% FLUSH: 3.0000 mL | INTRAVENOUS | Status: DC | PRN

## 2015-12-17 SURGICAL SUPPLY — 21 items
BALLN MINITREK RX 2.0X12 (BALLOONS) ×2
BALLN ~~LOC~~ EMERGE MR 2.5X20 (BALLOONS) ×2
BALLOON MINITREK RX 2.0X12 (BALLOONS) IMPLANT
BALLOON ~~LOC~~ EMERGE MR 2.5X20 (BALLOONS) IMPLANT
CATH INFINITI 5 FR JL3.5 (CATHETERS) ×1 IMPLANT
CATH INFINITI 5FR ANG PIGTAIL (CATHETERS) ×1 IMPLANT
CATH INFINITI JR4 5F (CATHETERS) ×1 IMPLANT
CATH VISTA GUIDE 6FR XBLAD3.5 (CATHETERS) ×1 IMPLANT
DEVICE RAD COMP TR BAND LRG (VASCULAR PRODUCTS) ×1 IMPLANT
ELECT DEFIB PAD ADLT CADENCE (PAD) ×1 IMPLANT
GLIDESHEATH SLEND SS 6F .021 (SHEATH) ×1 IMPLANT
HOVERMATT SINGLE USE (MISCELLANEOUS) ×1 IMPLANT
KIT ENCORE 26 ADVANTAGE (KITS) ×1 IMPLANT
KIT HEART LEFT (KITS) ×2 IMPLANT
PACK CARDIAC CATHETERIZATION (CUSTOM PROCEDURE TRAY) ×2 IMPLANT
STENT PROMUS PREM MR 2.25X28 (Permanent Stent) ×1 IMPLANT
STENT PROMUS PREM MR 2.25X8 (Permanent Stent) ×1 IMPLANT
TRANSDUCER W/STOPCOCK (MISCELLANEOUS) ×2 IMPLANT
TUBING CIL FLEX 10 FLL-RA (TUBING) ×2 IMPLANT
WIRE COUGAR XT STRL 190CM (WIRE) ×1 IMPLANT
WIRE SAFE-T 1.5MM-J .035X260CM (WIRE) ×1 IMPLANT

## 2015-12-17 NOTE — Progress Notes (Signed)
Inpatient Diabetes Program Recommendations  AACE/ADA: New Consensus Statement on Inpatient Glycemic Control (2015)  Target Ranges:  Prepandial:   less than 140 mg/dL      Peak postprandial:   less than 180 mg/dL (1-2 hours)      Critically ill patients:  140 - 180 mg/dL   Results for Raymond Lutz, Raymond Lutz (MRN [id number]) as of 12/17/2015 13:10  Ref. Range 06/24/2012 11:46 07/16/2013 10:28 02/07/2014 14:22 02/08/2014 04:22 01/06/2015 14:18 08/13/2015 11:00 12/10/2015 09:50  Hemoglobin A1C Unknown 7.8 (H) 7.1 (H)  6.7 (H) 6.6 (H) 6.3 6.6 Downward Trend    Review of Glycemic Control  Diabetes history: DM2 Outpatient Diabetes medications: Metformin XR 750 daily with breakfast; Glipizide 5 mg BID Current orders for Inpatient glycemic control: Novolog 0-15 units TIDAC, 0-5 units QHS  Inpatient Diabetes Program Recommendations:  Agree with plan, patient's A1C's are trending downward with current home  management.  Thank you,  Kristine LineaKaren Tashea Othman, RN, BSN Diabetes Coordinator Inpatient Diabetes Program [phone] (Team Pager) [phone] ([facility] office) [phone] [facility] office) [phone] [facility] office)

## 2015-12-17 NOTE — Progress Notes (Signed)
ANTICOAGULATION CONSULT NOTE - F/u Consult  Pharmacy Consult for IV heparin Indication: chest pain/ACS  No Known Allergies  Patient Measurements: Height: 6\' 1"  (185.4 cm) Weight: (!) 401 lb 9.6 oz (182.2 kg) IBW/kg (Calculated) : 79.9 Heparin Dosing Weight: 128.7 kg  Vital Signs: Temp: 98.9 F (37.2 C) (10/06 0501) Temp Source: Oral (10/06 0501) BP: 114/70 (10/06 0501) Pulse Rate: 74 (10/06 0501)  Labs:  Recent Labs  12/16/15 1635 12/16/15 2006 12/17/15 0102 12/17/15 0827  HGB 14.0  --  13.3  --   HCT 43.6  --  40.8  --   PLT 356  --  338  --   LABPROT 11.9  --   --   --   INR 0.88  --   --   --   HEPARINUNFRC  --   --  0.38 0.43  CREATININE 0.83  --  0.86  --   TROPONINI  --  0.73* 1.04* 1.00*   Estimated Creatinine Clearance: 179.5 mL/min (by C-G formula based on SCr of 0.86 mg/dL).  Medical History: Past Medical History:  Diagnosis Date  . Arthritis   . Asthma   . Chest pain    a. 05/2001 - Cardiolite: EF 47%, Diaphragmatic Attenuation.  . Coronary artery disease     nonobstructive ASCAD with 50% OM, 20-30% dLAD, 30-40%dRCA at cath 2013   . DIABETES MELLITUS, TYPE II 10/17/2006  . ED (erectile dysfunction)   . GERD (gastroesophageal reflux disease)   . HYPERLIPIDEMIA 10/17/2006  . HYPERTENSION 10/17/2006  . OBESITY 10/17/2006  . Paroxysmal atrial fibrillation (HCC)    2006 - was on coumadin - took himself off 6 mos after initiation.  . Shortness of breath   . Sleep apnea    not using CPAP regularly   Medications:  Scheduled:  . aspirin EC  325 mg Oral Daily  . atorvastatin  20 mg Oral QHS  . furosemide  40 mg Oral BID  . glipiZIDE  5 mg Oral BID AC  . [START ON 12/18/2015] Influenza vac split quadrivalent PF  0.5 mL Intramuscular Tomorrow-1000  . insulin aspart  0-15 Units Subcutaneous TID WC  . insulin aspart  0-5 Units Subcutaneous QHS  . lisinopril  40 mg Oral Daily  . metoprolol tartrate  50 mg Oral BID  . potassium chloride SA  40 mEq Oral q  morning - 10a  . sodium chloride flush  3 mL Intravenous Q12H   Assessment: Pharmacy is consulted to dose heparin in 50 yo male with ACS. Pt PMH is significant for history of CAD, HTN, DM, morbid obesity and strong family history of premature CAD. No noted anticoagulation on admission.   10/5  Hgb 14, Plt 356  PT 11.9, INR 0.88  SCr 0.83, CrCl > 100 ml/min Today, 10/6  0102 HL=0.38 , after 4000 unit bolus, infusion at 1550 units/hr. No interuptions or bleeding per RN- at goal but low end of desired range  0827 HL = 0.43 units/ml on 1650 units/hr  Goal of Therapy:  Heparin level 0.3-0.7 units/ml Monitor platelets by anticoagulation protocol: Yes   Plan:   Continue heparin drip at 1650 units/hr  [facility]  Monitor for signs and symptoms of bleeding  CBC ordered with AM labs  No scheduled HL, await cath lab results  Otho BellowsGreen, Contessa Preuss L PharmD Pager [phone] 12/17/2015, 10:51 AM

## 2015-12-17 NOTE — Progress Notes (Signed)
Pt presented to [facility] from [facility] for a cath procedure.  Pt alert and oriented and denies any discomfort or pain at this time.  Pt on monitor and  IVF infusing without any problems at this time.Consent signed

## 2015-12-17 NOTE — Care Management Note (Addendum)
Case Management Note  Patient Details  Name: Raymond Lutz MRN: [id number] Date of Birth: 1965/05/16  Subjective/Objective:     S/p coronary intervention, will be on brilinta, willl be on twilight study.  NCM will cont to follow for dc needs.                Action/Plan:   Expected Discharge Date:                  Expected Discharge Plan:  Home/Self Care  In-House Referral:     Discharge planning Services  CM Consult  Post Acute Care Choice:    Choice offered to:     DME Arranged:    DME Agency:     HH Arranged:    HH Agency:     Status of Service:  In process, will continue to follow  If discussed at Long Length of Stay Meetings, dates discussed:    Additional Comments:  Leone Havenaylor, Keygan Dumond Clinton, RN 12/17/2015, 5:35 PM

## 2015-12-17 NOTE — Progress Notes (Addendum)
PROGRESS NOTE                                                                                                                                                                                                             Patient Demographics:    Raymond Lutz, is a 50 y.o. male, DOB - [birth year], ONG:[id number]  Admit date - 12/16/2015   Admitting Physician Eddie NorthNishant Camreigh Michie, MD  Outpatient Primary MD for the patient is TODD,JEFFREY Freida BusmanALLEN, MD  LOS - 1  Outpatient Specialists: Dr. Mayford Knifeurner (cardiology)  Chief Complaint  Patient presents with  . Chest Pain  . Shortness of Breath       Brief Narrative   50 year old morbidly obese male with CAD status post cardiac cath in 2030 showing nonobstructive disease, prior seen on A. fib not on anticoagulant edition, GERD, hypertension, hyperlipidemia, type 2 diabetes mellitus on oral hypoglycemics, OSA noncompliant with CPAP resented to the ED with left-sided chest pain at rest since the day prior to admission. Patient found to have NSTEMI and admitted to telemetry. Seen by cardiology and scheduled for cardiac cath this afternoon.   Subjective:   Chest pain resolved this morning. Denies shortness of breath.   Assessment  & Plan :    Principal Problem:   NSTEMI (non-ST elevated myocardial infarction) (HCC) Troponin peaked at 1.04. Continue IV heparin. Full dose aspirin, beta blocker and sublingual nitrate. Continue Lipitor. LDL of 120 (dose should be increased). Cardiac cath this afternoon. If needs intervention he will stay [facility].  Active Problems:   DM II (diabetes mellitus, type II), controlled (HCC) Hold metformin. Continue glipizide and sliding scale coverage.    Hyperlipidemia LDL of 120, triglycerides [id number]. Increase Lipitor dose for optimal control.    Essential hypertension Stable. Continue medications.    GERD (gastroesophageal reflux disease) Continue  PPI    CAD (coronary artery disease)  Morbid obesity Needs counseling on diet and exercise      Code Status : Full Code  Family Communication  : None at bedside  Disposition Plan  : Home once workup completed  Barriers For Discharge : Cardiac cath  Consults  :  Cardiology  Procedures  : Cardiac cath scheduled today  DVT Prophylaxis  :  IV heparin  Lab Results  Component Value Date   PLT 338 12/17/2015  Antibiotics  :    Anti-infectives    None        Objective:   Vitals:   12/16/15 1857 12/16/15 2139 12/17/15 0501 12/17/15 1117  BP: (!) 149/89 123/64 114/70 115/74  Pulse: 75 72 74 70  Resp: 16 16 16    Temp: 98.1 F (36.7 C) 98.5 F (36.9 C) 98.9 F (37.2 C)   TempSrc: Oral Oral Oral   SpO2: 100% 99% 100%   Weight:   (!) 182.2 kg (401 lb 9.6 oz)   Height:        Wt Readings from Last 3 Encounters:  12/17/15 (!) 182.2 kg (401 lb 9.6 oz)  12/16/15 (!) 185 kg (407 lb 12.8 oz)  01/06/15 (!) 180.1 kg (397 lb)     Intake/Output Summary (Last 24 hours) at 12/17/15 1214 Last data filed at 12/17/15 1610  Gross per 24 hour  Intake           379.89 ml  Output                0 ml  Net           379.89 ml     Physical Exam  Gen: not in distress HEENT:  moist mucosa, supple neck Chest: clear b/l, no added sounds CVS: N S1&S2, no murmurs, rubs or gallop GI: soft, NT, ND, BS+ Musculoskeletal: warm, no edema     Data Review:    CBC  Recent Labs Lab 12/16/15 1635 12/17/15 0102  WBC 6.5 9.2  HGB 14.0 13.3  HCT 43.6 40.8  PLT 356 338  MCV 82.4 82.6  MCH 26.5 26.9  MCHC 32.1 32.6  RDW 15.0 14.9    Chemistries   Recent Labs Lab 12/16/15 1635 12/17/15 0102  NA 139 137  K 3.6 3.6  CL 101 101  CO2 29 28  GLUCOSE 103* 155*  BUN 13 15  CREATININE 0.83 0.86  CALCIUM 9.4 9.4   ------------------------------------------------------------------------------------------------------------------  Recent Labs  12/17/15 0827  CHOL  186  HDL 35*  LDLCALC 120*  TRIG 154*  CHOLHDL 5.3    Lab Results  Component Value Date   HGBA1C 6.6 12/10/2015   ------------------------------------------------------------------------------------------------------------------ No results for input(s): TSH, T4TOTAL, T3FREE, THYROIDAB in the last 72 hours.  Invalid input(s): FREET3 ------------------------------------------------------------------------------------------------------------------ No results for input(s): VITAMINB12, FOLATE, FERRITIN, TIBC, IRON, RETICCTPCT in the last 72 hours.  Coagulation profile  Recent Labs Lab 12/16/15 1635  INR 0.88    No results for input(s): DDIMER in the last 72 hours.  Cardiac Enzymes  Recent Labs Lab 12/16/15 2006 12/17/15 0102 12/17/15 0827  TROPONINI 0.73* 1.04* 1.00*   ------------------------------------------------------------------------------------------------------------------ No results found for: BNP  Inpatient Medications  Scheduled Meds: . aspirin EC  325 mg Oral Daily  . atorvastatin  20 mg Oral QHS  . furosemide  40 mg Oral BID  . glipiZIDE  5 mg Oral BID AC  . [START ON 12/18/2015] Influenza vac split quadrivalent PF  0.5 mL Intramuscular Tomorrow-1000  . insulin aspart  0-15 Units Subcutaneous TID WC  . insulin aspart  0-5 Units Subcutaneous QHS  . lisinopril  40 mg Oral Daily  . metoprolol tartrate  50 mg Oral BID  . potassium chloride SA  40 mEq Oral q morning - 10a  . sodium chloride flush  3 mL Intravenous Q12H   Continuous Infusions: . sodium chloride 100 mL/hr at 12/17/15 0529  . heparin 1,650 Units/hr (12/17/15 0630)   PRN Meds:.sodium chloride, acetaminophen, albuterol,  nitroGLYCERIN, ondansetron (ZOFRAN) IV, sodium chloride flush  Micro Results No results found for this or any previous visit (from the past 240 hour(s)).  Radiology Reports Dg Chest 2 View  Result Date: 12/16/2015 CLINICAL DATA:  Left chest pain and shortness of breath  EXAM: CHEST  2 VIEW COMPARISON:  Chest radiograph 09/09/2014 FINDINGS: Cardiomediastinal contours are normal. No pneumothorax or pleural effusion. No focal airspace consolidation or pulmonary edema. IMPRESSION: Clear lungs. Electronically Signed   By: Deatra Robinson M.D.   On: 12/16/2015 15:46    Time Spent in minutes  35   Eddie North M.D on 12/17/2015 at 12:14 PM  Between 7am to 7pm - Pager - [phone]  After 7pm go to [email or web address] - password [facility]  [facility] -  Office  [phone]

## 2015-12-17 NOTE — Interval H&P Note (Signed)
History and Physical Interval Note:  12/17/2015 2:26 PM  Larena Glassmanatrick D Karbowski  has presented today for cardiac cath with the diagnosis of NSTEMI. The various methods of treatment have been discussed with the patient and family. After consideration of risks, benefits and other options for treatment, the patient has consented to  Procedure(s): Left Heart Cath and Coronary Angiography (N/A) as a surgical intervention .  The patient's history has been reviewed, patient examined, no change in status, stable for surgery.  I have reviewed the patient's chart and labs.  Questions were answered to the patient's satisfaction.    Cath Lab Visit (complete for each Cath Lab visit)  Clinical Evaluation Leading to the Procedure:   ACS: Yes.    Non-ACS:    Anginal Classification: CCS III  Anti-ischemic medical therapy: Minimal Therapy (1 class of medications)  Non-Invasive Test Results: No non-invasive testing performed  Prior CABG: No previous CABG         Verne Carrowhristopher McAlhany

## 2015-12-17 NOTE — Progress Notes (Signed)
Pt to procedure  Room via stretcher.

## 2015-12-17 NOTE — Progress Notes (Signed)
ANTICOAGULATION CONSULT NOTE - F/u Consult  Pharmacy Consult for IV heparin Indication: chest pain/ACS  No Known Allergies  Patient Measurements: Height: 6\' 1"  (185.4 cm) Weight: (!) 407 lb (184.6 kg) IBW/kg (Calculated) : 79.9 Heparin Dosing Weight: 128.7 kg  Vital Signs: Temp: 98.5 F (36.9 C) (10/05 2139) Temp Source: Oral (10/05 2139) BP: 123/64 (10/05 2139) Pulse Rate: 72 (10/05 2139)  Labs:  Recent Labs  12/16/15 1635 12/16/15 2006 12/17/15 0102  HGB 14.0  --  13.3  HCT 43.6  --  40.8  PLT 356  --  338  LABPROT 11.9  --   --   INR 0.88  --   --   HEPARINUNFRC  --   --  0.38  CREATININE 0.83  --  0.86  TROPONINI  --  0.73* 1.04*    Estimated Creatinine Clearance: 181 mL/min (by C-G formula based on SCr of 0.86 mg/dL).   Medical History: Past Medical History:  Diagnosis Date  . Arthritis   . Asthma   . Chest pain    a. 05/2001 - Cardiolite: EF 47%, Diaphragmatic Attenuation.  . Coronary artery disease     nonobstructive ASCAD with 50% OM, 20-30% dLAD, 30-40%dRCA at cath 2013   . DIABETES MELLITUS, TYPE II 10/17/2006  . ED (erectile dysfunction)   . GERD (gastroesophageal reflux disease)   . HYPERLIPIDEMIA 10/17/2006  . HYPERTENSION 10/17/2006  . OBESITY 10/17/2006  . Paroxysmal atrial fibrillation (HCC)    2006 - was on coumadin - took himself off 6 mos after initiation.  . Shortness of breath   . Sleep apnea    not using CPAP regularly    Medications:  Scheduled:  . aspirin EC  325 mg Oral Daily  . atorvastatin  20 mg Oral QHS  . furosemide  40 mg Oral BID  . glipiZIDE  5 mg Oral BID AC  . insulin aspart  0-15 Units Subcutaneous TID WC  . insulin aspart  0-5 Units Subcutaneous QHS  . lisinopril  40 mg Oral Daily  . metoprolol tartrate  50 mg Oral BID  . potassium chloride SA  40 mEq Oral q morning - 10a  . sodium chloride flush  3 mL Intravenous Q12H    Assessment: Pharmacy is consulted to dose heparin in 50 yo male with ACS. Pt PMH is  significant for history of CAD, HTN, DM, morbid obesity and strong family history of premature CAD. No noted anticoagulation on admission.   10/5  Hgb 14, Plt 356  PT 11.9, INR 0.88  SCr 0.83, CrCl > 100 ml/min Today, 10/6  0102 HL=0.38 , no interuptions or bleeding per RN- at goal but low end   Goal of Therapy:  Heparin level 0.3-0.7 units/ml Monitor platelets by anticoagulation protocol: Yes   Plan:   Will increase heparin drip to 1650 units/hr to ensure stays in therapeutic range. (bolus effect?/pt large/good renal fcn/troponin increasing)  Recheck HL in 6 hours  Monitor for signs and symptoms of bleeding  CBC ordered with AM labs  Pt tentatively scheduled for cath today   Raymond Lutz, Raymond Lutz 12/17/2015 2:05 AM

## 2015-12-17 NOTE — Progress Notes (Signed)
Cardiac cath showed the following findings: Severe stenosis second obtuse marginal branch. Tandem 99% stenoses in OM2. Successful PTCA/DES x 1 OM2.  Severe stenosis distal LAD treated successfully with PTCA/DES x 1. The apical LAD stenosis is in the very small apical LAD and is too small for PCI.  . Mild non-obstructive disease in the RCA.  See full recommendations per cardiology.  [facility]

## 2015-12-17 NOTE — Progress Notes (Signed)
Subjective: Denies chest pain or SOB.   Objective: Vital signs in last 24 hours: Temp:  [98.1 F (36.7 C)-98.9 F (37.2 C)] 98.9 F (37.2 C) (10/06 0501) Pulse Rate:  [69-83] 74 (10/06 0501) Resp:  [8-22] 16 (10/06 0501) BP: (114-170)/(64-100) 114/70 (10/06 0501) SpO2:  [98 %-100 %] 100 % (10/06 0501) Weight:  [401 lb 9.6 oz (182.2 kg)-407 lb 12.8 oz (185 kg)] 401 lb 9.6 oz (182.2 kg) (10/06 0501) Weight change:  Last BM Date: 12/16/15 Intake/Output from previous day: 10/05 0701 - 10/06 0700 In: 379.9 [P.O.:240; I.V.:139.9] Out: -  Intake/Output this shift: No intake/output data recorded.  PE: General:Pleasant affect, NAD Skin:Warm and dry, brisk capillary refill HEENT:normocephalic, sclera clear, mucus membranes moist Neck:supple, no JVD, no bruits  Heart:S1S2 RRR without murmur, gallup, rub or click Lungs:clear without rales, rhonchi, or wheezes ZOX:WRUEAbd:soft, non tender, + BS, do not palpate liver spleen or masses Ext:no lower ext edema, 2+ pedal pulses, 2+ radial pulses Neuro:alert and oriented, MAE, follows commands, + facial symmetry TELE:  SR with NSVT 3 beats.     Lab Results:  Recent Labs  12/16/15 1635 12/17/15 0102  WBC 6.5 9.2  HGB 14.0 13.3  HCT 43.6 40.8  PLT 356 338   BMET  Recent Labs  12/16/15 1635 12/17/15 0102  NA 139 137  K 3.6 3.6  CL 101 101  CO2 29 28  GLUCOSE 103* 155*  BUN 13 15  CREATININE 0.83 0.86  CALCIUM 9.4 9.4    Recent Labs  12/16/15 2006 12/17/15 0102  TROPONINI 0.73* 1.04*    Lab Results  Component Value Date   CHOL 177 02/08/2014   HDL 30 (L) 02/08/2014   LDLCALC 118 (H) 02/08/2014   LDLDIRECT 171.0 06/24/2012   TRIG 147 02/08/2014   CHOLHDL 6 06/24/2012   Lab Results  Component Value Date   HGBA1C 6.6 12/10/2015         Studies/Results: Dg Chest 2 View  Result Date: 12/16/2015 CLINICAL DATA:  Left chest pain and shortness of breath EXAM: CHEST  2 VIEW COMPARISON:  Chest radiograph  09/09/2014 FINDINGS: Cardiomediastinal contours are normal. No pneumothorax or pleural effusion. No focal airspace consolidation or pulmonary edema. IMPRESSION: Clear lungs. Electronically Signed   By: Deatra RobinsonKevin  Herman M.D.   On: 12/16/2015 15:46    Medications: I have reviewed the patient's current medications. Scheduled Meds: . aspirin EC  325 mg Oral Daily  . atorvastatin  20 mg Oral QHS  . furosemide  40 mg Oral BID  . glipiZIDE  5 mg Oral BID AC  . [START ON 12/18/2015] Influenza vac split quadrivalent PF  0.5 mL Intramuscular Tomorrow-1000  . insulin aspart  0-15 Units Subcutaneous TID WC  . insulin aspart  0-5 Units Subcutaneous QHS  . lisinopril  40 mg Oral Daily  . metoprolol tartrate  50 mg Oral BID  . potassium chloride SA  40 mEq Oral q morning - 10a  . sodium chloride flush  3 mL Intravenous Q12H   Continuous Infusions: . sodium chloride 100 mL/hr at 12/17/15 0529  . heparin 1,650 Units/hr (12/17/15 0630)   PRN Meds:.sodium chloride, acetaminophen, albuterol, nitroGLYCERIN, ondansetron (ZOFRAN) IV, sodium chloride flush  Assessment/Plan: 1. NSTEMI/Unstable angina.  Prolonged chest pain now resolved. Pk troponin 1.04.  Known nonobstructive CAD from 2013. Admitteded to  Telemetry on IV heparin, beta blocker, Nitrates for recurrent pain. Cycle cardiac enzymes and Ecg. With elevated troponin assay pt is  for cath today and possible PCI.    The procedure and risks were reviewed including but not limited to death, myocardial infarction, stroke, arrythmias, bleeding, transfusion, emergency surgery, dye allergy, or renal dysfunction. The patient voices understanding and is agreeable to proceed.  2. DM type 2. Plan to hold metformin. SSI. On glipizide per IM 3. HTN continue outpatient meds and now with beta blocker. BB stable 4. Morbid obesity and OSA. CPAP therapy 5. Hyperlipidemia. On statin. Check lipid panel.    LOS: 1 day   Time spent with pt. : 15 minutes. Nada Boozer   Nurse Practitioner Certified Pager [phone] or after 5pm and on weekends call [phone] 12/17/2015, 7:43 AM  Personally seen and examined. Agree with above. Cath today as above No CP currently NSTEMI Obese.  Donato Schultz, MD

## 2015-12-17 NOTE — H&P (View-Only) (Signed)
Subjective: Denies chest pain or SOB.   Objective: Vital signs in last 24 hours: Temp:  [98.1 F (36.7 C)-98.9 F (37.2 C)] 98.9 F (37.2 C) (10/06 0501) Pulse Rate:  [69-83] 74 (10/06 0501) Resp:  [8-22] 16 (10/06 0501) BP: (114-170)/(64-100) 114/70 (10/06 0501) SpO2:  [98 %-100 %] 100 % (10/06 0501) Weight:  [401 lb 9.6 oz (182.2 kg)-407 lb 12.8 oz (185 kg)] 401 lb 9.6 oz (182.2 kg) (10/06 0501) Weight change:  Last BM Date: 12/16/15 Intake/Output from previous day: 10/05 0701 - 10/06 0700 In: 379.9 [P.O.:240; I.V.:139.9] Out: -  Intake/Output this shift: No intake/output data recorded.  PE: General:Pleasant affect, NAD Skin:Warm and dry, brisk capillary refill HEENT:normocephalic, sclera clear, mucus membranes moist Neck:supple, no JVD, no bruits  Heart:S1S2 RRR without murmur, gallup, rub or click Lungs:clear without rales, rhonchi, or wheezes ZOX:WRUEAbd:soft, non tender, + BS, do not palpate liver spleen or masses Ext:no lower ext edema, 2+ pedal pulses, 2+ radial pulses Neuro:alert and oriented, MAE, follows commands, + facial symmetry TELE:  SR with NSVT 3 beats.     Lab Results:  Recent Labs  12/16/15 1635 12/17/15 0102  WBC 6.5 9.2  HGB 14.0 13.3  HCT 43.6 40.8  PLT 356 338   BMET  Recent Labs  12/16/15 1635 12/17/15 0102  NA 139 137  K 3.6 3.6  CL 101 101  CO2 29 28  GLUCOSE 103* 155*  BUN 13 15  CREATININE 0.83 0.86  CALCIUM 9.4 9.4    Recent Labs  12/16/15 2006 12/17/15 0102  TROPONINI 0.73* 1.04*    Lab Results  Component Value Date   CHOL 177 02/08/2014   HDL 30 (L) 02/08/2014   LDLCALC 118 (H) 02/08/2014   LDLDIRECT 171.0 06/24/2012   TRIG 147 02/08/2014   CHOLHDL 6 06/24/2012   Lab Results  Component Value Date   HGBA1C 6.6 12/10/2015         Studies/Results: Dg Chest 2 View  Result Date: 12/16/2015 CLINICAL DATA:  Left chest pain and shortness of breath EXAM: CHEST  2 VIEW COMPARISON:  Chest radiograph  09/09/2014 FINDINGS: Cardiomediastinal contours are normal. No pneumothorax or pleural effusion. No focal airspace consolidation or pulmonary edema. IMPRESSION: Clear lungs. Electronically Signed   By: Deatra RobinsonKevin  Herman M.D.   On: 12/16/2015 15:46    Medications: I have reviewed the patient's current medications. Scheduled Meds: . aspirin EC  325 mg Oral Daily  . atorvastatin  20 mg Oral QHS  . furosemide  40 mg Oral BID  . glipiZIDE  5 mg Oral BID AC  . [START ON 12/18/2015] Influenza vac split quadrivalent PF  0.5 mL Intramuscular Tomorrow-1000  . insulin aspart  0-15 Units Subcutaneous TID WC  . insulin aspart  0-5 Units Subcutaneous QHS  . lisinopril  40 mg Oral Daily  . metoprolol tartrate  50 mg Oral BID  . potassium chloride SA  40 mEq Oral q morning - 10a  . sodium chloride flush  3 mL Intravenous Q12H   Continuous Infusions: . sodium chloride 100 mL/hr at 12/17/15 0529  . heparin 1,650 Units/hr (12/17/15 0630)   PRN Meds:.sodium chloride, acetaminophen, albuterol, nitroGLYCERIN, ondansetron (ZOFRAN) IV, sodium chloride flush  Assessment/Plan: 1. NSTEMI/Unstable angina.  Prolonged chest pain now resolved. Pk troponin 1.04.  Known nonobstructive CAD from 2013. Admitteded to  Telemetry on IV heparin, beta blocker, Nitrates for recurrent pain. Cycle cardiac enzymes and Ecg. With elevated troponin assay pt is  for cath today and possible PCI.    The procedure and risks were reviewed including but not limited to death, myocardial infarction, stroke, arrythmias, bleeding, transfusion, emergency surgery, dye allergy, or renal dysfunction. The patient voices understanding and is agreeable to proceed.  2. DM type 2. Plan to hold metformin. SSI. On glipizide per IM 3. HTN continue outpatient meds and now with beta blocker. BB stable 4. Morbid obesity and OSA. CPAP therapy 5. Hyperlipidemia. On statin. Check lipid panel.    LOS: 1 day   Time spent with pt. : 15 minutes. Nada Boozer   Nurse Practitioner Certified Pager [phone] or after 5pm and on weekends call [phone] 12/17/2015, 7:43 AM  Personally seen and examined. Agree with above. Cath today as above No CP currently NSTEMI Obese.  Donato Schultz, MD

## 2015-12-17 NOTE — Research (Signed)
Presented the TWILIGHT Research study to Patients wife. Questions encouraged and answered. Research will follow up Saturday .

## 2015-12-18 DIAGNOSIS — I249 Acute ischemic heart disease, unspecified: Secondary | ICD-10-CM

## 2015-12-18 DIAGNOSIS — G4733 Obstructive sleep apnea (adult) (pediatric): Secondary | ICD-10-CM

## 2015-12-18 LAB — BASIC METABOLIC PANEL
Anion gap: 11 (ref 5–15)
BUN: 10 mg/dL (ref 6–20)
CO2: 25 mmol/L (ref 22–32)
Calcium: 8.9 mg/dL (ref 8.9–10.3)
Chloride: 101 mmol/L (ref 101–111)
Creatinine, Ser: 0.92 mg/dL (ref 0.61–1.24)
GFR calc Af Amer: >60 mL/min (ref >60)
GFR calc non Af Amer: >60 mL/min (ref >60)
Glucose, Bld: 116 mg/dL — ABNORMAL HIGH (ref 65–99)
Potassium: 3.6 mmol/L (ref 3.5–5.1)
Sodium: 137 mmol/L (ref 135–145)

## 2015-12-18 LAB — CBC
HCT: 40 % (ref 39.0–52.0)
Hemoglobin: 12.6 g/dL — ABNORMAL LOW (ref 13.0–17.0)
MCH: 26.4 pg (ref 26.0–34.0)
MCHC: 31.5 g/dL (ref 30.0–36.0)
MCV: 83.9 fL (ref 78.0–100.0)
Platelets: 296 10*3/uL (ref 150–400)
RBC: 4.77 MIL/uL (ref 4.22–5.81)
RDW: 15 % (ref 11.5–15.5)
WBC: 7.9 10*3/uL (ref 4.0–10.5)

## 2015-12-18 LAB — GLUCOSE, CAPILLARY: Glucose-Capillary: 117 mg/dL — ABNORMAL HIGH (ref 65–99)

## 2015-12-18 MED ORDER — ASPIRIN 81 MG PO CHEW: 81.0000 mg | CHEWABLE_TABLET | Freq: Every day | ORAL | Status: DC

## 2015-12-18 MED ORDER — ATORVASTATIN CALCIUM 80 MG PO TABS: 80.0000 mg | ORAL_TABLET | Freq: Every day | ORAL | 0 refills | Status: DC

## 2015-12-18 MED ORDER — ATORVASTATIN CALCIUM 80 MG PO TABS: 80.0000 mg | ORAL_TABLET | Freq: Every day | ORAL | Status: DC

## 2015-12-18 MED ORDER — METOPROLOL TARTRATE 50 MG PO TABS: 50.0000 mg | ORAL_TABLET | Freq: Two times a day (BID) | ORAL | 6 refills | Status: DC

## 2015-12-18 MED ORDER — TICAGRELOR 90 MG PO TABS: 90.0000 mg | ORAL_TABLET | Freq: Two times a day (BID) | ORAL | Status: DC

## 2015-12-18 MED ORDER — NITROGLYCERIN 0.4 MG SL SUBL: 0.4000 mg | SUBLINGUAL_TABLET | SUBLINGUAL | 3 refills | Status: DC | PRN

## 2015-12-18 NOTE — Discharge Instructions (Addendum)
PLEASE REMEMBER TO BRING ALL OF YOUR MEDICATIONS TO EACH OF YOUR FOLLOW-UP OFFICE VISITS. ° °PLEASE ATTEND ALL SCHEDULED FOLLOW-UP APPOINTMENTS.  ° °Activity: Increase activity slowly as tolerated. You may shower, but no soaking baths (or swimming) for 1 week. No driving for 1 week. No lifting over 10 lbs for 2 weeks. No sexual activity for 2 weeks.  ° °You May Return to Work: in 1 week (if applicable) ° °Wound Care: You may wash cath site gently with soap and water. Keep cath site clean and dry. If you notice pain, swelling, bleeding or pus at your cath site, please call [phone]. ° ° °

## 2015-12-18 NOTE — Progress Notes (Signed)
CARDIAC REHAB PHASE I   PRE:  Rate/Rhythm: 78SR   BP:   Sitting: 132/65     SaO2: 98% RA  MODE:  Ambulation: 500 ft   POST:  Rate/Rhythm: 93 SR  BP:   Sitting: 169/81  rchk 151/76     SaO2: 97 % RA   [phone]  Pt ambulated 59700ft indendently. Pt maintained a steady gait and remained symptom free. Pt returned to bed with VSS, Pt is stressed about work and want to return as soon as possible. Discussed speaking with cardiologist about readiness for work. Did educate pt on risk factors, lifting and activity restrictions, importance of Brilinta and ASA therapy, stent cards, exercise guidelines, diabetic and HH diet. Ref[facility] pt [facility].   Jamina Macbeth D Jarelle Ates,MS,ACSM-RCEP 12/18/2015 10:18 AM

## 2015-12-18 NOTE — Research (Signed)
TWILIGHT Informed Consent   Subject Name: Raymond Lutz  Subject met inclusion and exclusion criteria.  The informed consent form, study requirements and expectations were reviewed with the subject and questions and concerns were addressed prior to the signing of the consent form.  The subject verbalized understanding of the trail requirements.  The subject agreed to participate in the TWILIGHT trial and signed the informed consent.  The informed consent was obtained prior to performance of any protocol-specific procedures for the subject.  A copy of the signed informed consent was given to the subject and a copy was placed in the subject's medical record.  [name] 12/18/2015, [phone]

## 2015-12-18 NOTE — Discharge Summary (Signed)
Physician Discharge Summary  Raymond Lutz WJX:[id number] DOB: [birth year] DOA: 12/16/2015  PCP: Evette Georges, MD  Admit date: 12/16/2015 Discharge date: 12/18/2015  Admitted From: home  Disposition:  home   Recommendations for Outpatient Follow-up:  1. Recommend compliance with CPAP  Home Health:  none  Equipment/Devices:  none    Discharge Condition:  stable   CODE STATUS:  Full code   Diet recommendation:  Low sodium,heart healthy, diabetic Consultations:  cardiology    Discharge Diagnoses:  Principal Problem:   NSTEMI (non-ST elevated myocardial infarction) (HCC) Active Problems:   ACS (acute coronary syndrome) (HCC)   DM II (diabetes mellitus, type II), controlled (HCC)   Hyperlipidemia   Morbid obesity (HCC)   Essential hypertension   GERD (gastroesophageal reflux disease)   CAD (coronary artery disease)   Chest pain   OSA (obstructive sleep apnea)    Subjective:   Brief Summary: 50 year old morbidly obese male with CAD status post cardiac cath in 2030 showing nonobstructive disease, PAF not on anticoagulation, morbid obesity, OSA, GERD, hypertension, hyperlipidemia, type 2 diabetes mellitus on oral hypoglycemics, OSA noncompliant with CPAP resented to the ED with left-sided chest pain at rest since the day prior to admission. He was found to be having an NSTEMI and admitted to telemetry.  Hospital Course:  Principal Problem:   NSTEMI (non-ST elevated myocardial infarction) (HCC) - Troponin peaked at 1.04. Placed on IV Heparin, aspirin, beta blocker and sublingual nitrate. - Continue Lipitor. LDL of 120 - dose increased - change Diltiazem to Metoprolol Cardiac cath report:  Severe stenosis second obtuse marginal branch. Tandem 99% stenoses in OM2. Successful PTCA/DES x 1 OM2.  Severe stenosis distal LAD treated successfully with PTCA/DES x 1. The apical LAD stenosis is in the very small apical LAD and is too small for PCI.  . Mild non-obstructive  disease in the RCA - stable for d/c today per cardiology   Active Problems: Severe LVH and dilated L atrium - noted on ECHO- see below  H/o pedal edema - con Lasix  H/o PAF - took himself off on Coumadin about 6 months later- per cardiology, no need to resume unless it recurs - no episodes of A-fib documented during this hospital stay.   OSA - admits to non-compliance with CPAP- have advised him of the need to maintain compliance and the complication of not complying    DM II (diabetes mellitus, type II), controlled (HCC) Held metformin- cont to hold due to cath- advised patient - Continue glipizide and sliding scale coverage.    Hyperlipidemia LDL of 120, triglycerides 213. Increased Lipitor  from 20 to 80 mg    Essential hypertension Stable. Diltiazem changed to Metoprolol    GERD (gastroesophageal reflux disease) Continue PPI   Morbid obesity Body mass index is 51.92 kg/m. Discussed weight loss and exercise    Discharge Instructions  Discharge Instructions    Amb Referral to Cardiac Rehabilitation    Complete by:  As directed    Diagnosis:  NSTEMI   Diet - low sodium heart healthy    Complete by:  As directed    Discharge instructions    Complete by:  As directed    Low sodium, low fat diet Start Metformin on Monday.   Increase activity slowly    Complete by:  As directed        Medication List    STOP taking these medications   aspirin EC 325 MG tablet Replaced by:  aspirin 81 MG chewable tablet  diltiazem 240 MG 24 hr capsule Commonly known as:  TAZTIA XT   indomethacin 25 MG capsule Commonly known as:  INDOCIN   metFORMIN 750 MG 24 hr tablet Commonly known as:  GLUCOPHAGE-XR     TAKE these medications   albuterol 108 (90 Base) MCG/ACT inhaler Commonly known as:  VENTOLIN HFA Inhale 2 puffs into the lungs every 6 (six) hours as needed.   aspirin 81 MG chewable tablet Chew 1 tablet (81 mg total) by mouth daily. Replaces:   aspirin EC 325 MG tablet   atorvastatin 80 MG tablet Commonly known as:  LIPITOR Take 1 tablet (80 mg total) by mouth at bedtime. What changed:  medication strength  how much to take   BOOSTRIX 5-2.5-18.5 LF-MCG/0.5 injection Generic drug:  Tdap   furosemide 40 MG tablet Commonly known as:  LASIX Take 1 tablet (40 mg total) by mouth 2 (two) times daily.   glipiZIDE 5 MG tablet Commonly known as:  GLUCOTROL Take 1 tablet (5 mg total) by mouth 2 (two) times daily before a meal.   glucose blood test strip 1 each by Other route. Use as instructed   lisinopril 40 MG tablet Commonly known as:  PRINIVIL,ZESTRIL Take 1 tablet (40 mg total) by mouth daily.   potassium chloride SA 20 MEQ tablet Commonly known as:  K-DUR,KLOR-CON Take 2 tablets (40 mEq total) by mouth every morning.   ticagrelor 90 MG Tabs tablet Commonly known as:  BRILINTA Take 1 tablet (90 mg total) by mouth 2 (two) times daily.      Follow-up Information    Armanda Magic, MD .   Specialty:  Cardiology Why:  The office will call you to arrange Cardiology hospital follow-up. If you do not hear from them in 3 business days, please call the number provided.  Contact information: [address] [place] [address] [phone]          No Known Allergies   Procedures/Studies: 2 D ECHO Study Conclusions  - Left ventricle: The cavity size was normal. Wall thickness was   increased in a pattern of severe LVH. Systolic function was   normal. The estimated ejection fraction was in the range of 60%   to 65%. Left ventricular diastolic function parameters were   normal. - Left atrium: The atrium was moderately dilated. - Atrial septum: No defect or patent foramen ovale was identified.   Dg Chest 2 View  Result Date: 12/16/2015 CLINICAL DATA:  Left chest pain and shortness of breath EXAM: CHEST  2 VIEW COMPARISON:  Chest radiograph 09/09/2014 FINDINGS: Cardiomediastinal contours are  normal. No pneumothorax or pleural effusion. No focal airspace consolidation or pulmonary edema. IMPRESSION: Clear lungs. Electronically Signed   By: Deatra Robinson M.D.   On: 12/16/2015 15:46       Discharge Exam: Vitals:   12/18/15 0218 12/18/15 0727  BP: 126/70 (!) 120/56  Pulse:  74  Resp: 15 18  Temp: 97.8 F (36.6 C) 97.9 F (36.6 C)   Vitals:   12/17/15 1945 12/17/15 2000 12/18/15 0218 12/18/15 0727  BP: (!) 154/79  126/70 (!) 120/56  Pulse: 78 79  74  Resp: 14 14 15 18   Temp: 99.5 F (37.5 C)  97.8 F (36.6 C) 97.9 F (36.6 C)  TempSrc: Oral  Oral Oral  SpO2: 99% 100% 97% 99%  Weight:   (!) 178.5 kg (393 lb 8.3 oz)   Height:        General: Pt is  alert, awake, not in acute distress Cardiovascular: RRR, S1/S2 +, no rubs, no gallops Respiratory: CTA bilaterally, no wheezing, no rhonchi Abdominal: Soft, NT, ND, bowel sounds + Extremities: no edema, no cyanosis    The results of significant diagnostics from this hospitalization (including imaging, microbiology, ancillary and laboratory) are listed below for reference.     Microbiology: No results found for this or any previous visit (from the past 240 hour(s)).   Labs: BNP (last 3 results) No results for input(s): BNP in the last 8760 hours. Basic Metabolic Panel:  Recent Labs Lab 12/16/15 1635 12/17/15 0102 12/18/15 0409  NA 139 137 137  K 3.6 3.6 3.6  CL 101 101 101  CO2 29 28 25   GLUCOSE 103* 155* 116*  BUN 13 15 10   CREATININE 0.83 0.86 0.92  CALCIUM 9.4 9.4 8.9   Liver Function Tests: No results for input(s): AST, ALT, ALKPHOS, BILITOT, PROT, ALBUMIN in the last 168 hours. No results for input(s): LIPASE, AMYLASE in the last 168 hours. No results for input(s): AMMONIA in the last 168 hours. CBC:  Recent Labs Lab 12/16/15 1635 12/17/15 0102 12/18/15 0409  WBC 6.5 9.2 7.9  HGB 14.0 13.3 12.6*  HCT 43.6 40.8 40.0  MCV 82.4 82.6 83.9  PLT 356 338 296   Cardiac Enzymes:  Recent  Labs Lab 12/16/15 2006 12/17/15 0102 12/17/15 0827  TROPONINI 0.73* 1.04* 1.00*   BNP: Invalid input(s): POCBNP CBG:  Recent Labs Lab 12/17/15 0748 12/17/15 1142 12/17/15 1652 12/17/15 2138 12/18/15 0616  GLUCAP 151* 129* 109* 86 117*   D-Dimer No results for input(s): DDIMER in the last 72 hours. Hgb A1c No results for input(s): HGBA1C in the last 72 hours. Lipid Profile  Recent Labs  12/17/15 0827  CHOL 186  HDL 35*  LDLCALC 120*  TRIG 154*  CHOLHDL 5.3   Thyroid function studies No results for input(s): TSH, T4TOTAL, T3FREE, THYROIDAB in the last 72 hours.  Invalid input(s): FREET3 Anemia work up No results for input(s): VITAMINB12, FOLATE, FERRITIN, TIBC, IRON, RETICCTPCT in the last 72 hours. Urinalysis    Component Value Date/Time   COLORURINE YELLOW 12/16/2015 1616   APPEARANCEUR CLEAR 12/16/2015 1616   LABSPEC 1.010 12/16/2015 1616   PHURINE 7.0 12/16/2015 1616   GLUCOSEU NEGATIVE 12/16/2015 1616   HGBUR SMALL (A) 12/16/2015 1616   HGBUR trace-lysed 12/08/2009 0940   BILIRUBINUR NEGATIVE 12/16/2015 1616   BILIRUBINUR n 06/24/2012 1204   KETONESUR NEGATIVE 12/16/2015 1616   PROTEINUR NEGATIVE 12/16/2015 1616   UROBILINOGEN 0.2 06/24/2012 1204   UROBILINOGEN 0.2 12/08/2009 0940   NITRITE NEGATIVE 12/16/2015 1616   LEUKOCYTESUR NEGATIVE 12/16/2015 1616   Sepsis Labs Invalid input(s): PROCALCITONIN,  WBC,  LACTICIDVEN Microbiology No results found for this or any previous visit (from the past 240 hour(s)).   Time coordinating discharge: Over 30 minutes  SIGNED:   Calvert CantorIZWAN,Cline Draheim, MD  [facility] 12/18/2015, 11:04 AM Pager   If 7PM-7AM, please contact night-coverage [email or web address] Password [email or web address]

## 2015-12-18 NOTE — Progress Notes (Signed)
Subjective: No CP  No SOB   Objective: Vitals:   12/17/15 1630 12/17/15 1945 12/17/15 2000 12/18/15 0218  BP: (!) 169/84 (!) 154/79  126/70  Pulse: 67 78 79   Resp: 12 14 14 15   Temp:  99.5 F (37.5 C)  97.8 F (36.6 C)  TempSrc:  Oral  Oral  SpO2: 100% 99% 100% 97%  Weight:    (!) 393 lb 8.3 oz (178.5 kg)  Height:       Weight change: -13 lb 7.7 oz (-6.114 kg)  Intake/Output Summary (Last 24 hours) at 12/18/15 0631 Last data filed at 12/17/15 2330  Gross per 24 hour  Intake          1233.75 ml  Output             2080 ml  Net          -846.25 ml    General: Alert, awake, oriented x3, in no acute distress Neck:  JVP is normal Heart: Regular rate and rhythm, without murmurs, rubs, gallops.  Lungs: Clear to auscultation.  No rales or wheezes. Exemities:  No edema.   Neuro: Grossly intact, nonfocal.  Tele  SR   Lab Results: Results for orders placed or performed during the hospital encounter of 12/16/15 (from the past 24 hour(s))  Glucose, capillary     Status: Abnormal   Collection Time: 12/17/15  7:48 AM  Result Value Ref Range   Glucose-Capillary 151 (H) 65 - 99 mg/dL  Troponin I     Status: Abnormal   Collection Time: 12/17/15  8:27 AM  Result Value Ref Range   Troponin I 1.00 (HH) <0.03 ng/mL  Heparin level (unfractionated)     Status: None   Collection Time: 12/17/15  8:27 AM  Result Value Ref Range   Heparin Unfractionated 0.43 0.30 - 0.70 IU/mL  Lipid panel     Status: Abnormal   Collection Time: 12/17/15  8:27 AM  Result Value Ref Range   Cholesterol 186 0 - 200 mg/dL   Triglycerides 409 (H) <150 mg/dL   HDL 35 (L) >81 mg/dL   Total CHOL/HDL Ratio 5.3 RATIO   VLDL 31 0 - 40 mg/dL   LDL Cholesterol 191 (H) 0 - 99 mg/dL  Glucose, capillary     Status: Abnormal   Collection Time: 12/17/15 11:42 AM  Result Value Ref Range   Glucose-Capillary 129 (H) 65 - 99 mg/dL  Glucose, capillary     Status: Abnormal   Collection Time: 12/17/15  4:52 PM  Result  Value Ref Range   Glucose-Capillary 109 (H) 65 - 99 mg/dL  Glucose, capillary     Status: None   Collection Time: 12/17/15  9:38 PM  Result Value Ref Range   Glucose-Capillary 86 65 - 99 mg/dL  Basic metabolic panel     Status: Abnormal   Collection Time: 12/18/15  4:09 AM  Result Value Ref Range   Sodium 137 135 - 145 mmol/L   Potassium 3.6 3.5 - 5.1 mmol/L   Chloride 101 101 - 111 mmol/L   CO2 25 22 - 32 mmol/L   Glucose, Bld 116 (H) 65 - 99 mg/dL   BUN 10 6 - 20 mg/dL   Creatinine, Ser 4.78 0.61 - 1.24 mg/dL   Calcium 8.9 8.9 - 29.5 mg/dL   GFR calc non Af Amer >60 >60 mL/min   GFR calc Af Amer >60 >60 mL/min   Anion gap 11 5 - 15  CBC  Status: Abnormal   Collection Time: 12/18/15  4:09 AM  Result Value Ref Range   WBC 7.9 4.0 - 10.5 K/uL   RBC 4.77 4.22 - 5.81 MIL/uL   Hemoglobin 12.6 (L) 13.0 - 17.0 g/dL   HCT 16.140.0 09.639.0 - 04.552.0 %   MCV 83.9 78.0 - 100.0 fL   MCH 26.4 26.0 - 34.0 pg   MCHC 31.5 30.0 - 36.0 g/dL   RDW 40.915.0 81.111.5 - 91.415.5 %   Platelets 296 150 - 400 K/uL  Glucose, capillary     Status: Abnormal   Collection Time: 12/18/15  6:16 AM  Result Value Ref Range   Glucose-Capillary 117 (H) 65 - 99 mg/dL    Studies/Results: No results found.  Medications: REviewed    @PROBHOSP @  1  NSTEMI  Cath yesterday showed 2 V CAD  Pt underent PTCA/PROMUS stent to LAD (dista) The apical LAD stenosis was too small for PTCA  He also underent PTCA/DES to OM2.   Plan for Brilinta and ASA for 1 year   Study nurse to see this AM  Prior to d/c   Echo LVEF is normal   2  HTN  BP is better this am    3  HL LDL is 120  HDL 35  4 DM  Hold metformin until 72 hours post cath  5  OSA  Encouraged him to use CPAP     LOS: 2 days   Dietrich PatesPaula Saban Heinlen 12/18/2015, 6:31 AM

## 2015-12-20 ENCOUNTER — Encounter (HOSPITAL_COMMUNITY): Payer: Self-pay | Admitting: Cardiovascular Disease

## 2015-12-20 ENCOUNTER — Other Ambulatory Visit: Payer: Self-pay | Admitting: *Deleted

## 2015-12-20 LAB — POCT ACTIVATED CLOTTING TIME
Activated Clotting Time: 252 seconds
Activated Clotting Time: 450 seconds

## 2015-12-20 MED ORDER — AMBULATORY NON FORMULARY MEDICATION: 90.0000 mg | Freq: Two times a day (BID) | Status: DC

## 2015-12-20 MED ORDER — AMBULATORY NON FORMULARY MEDICATION: 81.0000 mg | Freq: Every day | Status: DC

## 2016-01-18 ENCOUNTER — Telehealth: Payer: Self-pay | Admitting: *Deleted

## 2016-01-18 NOTE — Telephone Encounter (Signed)
[facility] Research Study month 1 telephone call attempted. Left message for patient to call research office.

## 2016-01-20 ENCOUNTER — Telehealth: Payer: Self-pay | Admitting: *Deleted

## 2016-01-20 ENCOUNTER — Encounter: Payer: Self-pay | Admitting: *Deleted

## 2016-01-20 DIAGNOSIS — Z006 Encounter for examination for normal comparison and control in clinical research program: Secondary | ICD-10-CM

## 2016-01-20 NOTE — Telephone Encounter (Signed)
Left message for patient to call research office for month 1 [facility] research study follow up. Also patient needs to schedule 3 month appointment with research to pick up more brilinta. I also wanted to see if he has been seen post discharge by cardiology.

## 2016-01-20 NOTE — Progress Notes (Signed)
[facility] research month 1 telephone follow up completed. Patient returned call to office stating he was doing well on medication and had not missed any doses. He denied amy bleeding or other adverse events. He also stated he would give the cardiology office a call to schedule appointment

## 2016-01-30 ENCOUNTER — Other Ambulatory Visit (HOSPITAL_COMMUNITY): Payer: Self-pay | Admitting: Internal Medicine

## 2016-01-30 DIAGNOSIS — Z760 Encounter for issue of repeat prescription: Secondary | ICD-10-CM

## 2016-02-01 ENCOUNTER — Ambulatory Visit (INDEPENDENT_AMBULATORY_CARE_PROVIDER_SITE_OTHER): Payer: BLUE CROSS/BLUE SHIELD | Admitting: Student

## 2016-02-01 ENCOUNTER — Encounter: Payer: Self-pay | Admitting: Student

## 2016-02-01 VITALS — BP 138/86 | HR 63 | Ht 73.0 in | Wt 338.6 lb

## 2016-02-01 DIAGNOSIS — E119 Type 2 diabetes mellitus without complications: Secondary | ICD-10-CM

## 2016-02-01 DIAGNOSIS — I2510 Atherosclerotic heart disease of native coronary artery without angina pectoris: Secondary | ICD-10-CM | POA: Diagnosis not present

## 2016-02-01 DIAGNOSIS — I214 Non-ST elevation (NSTEMI) myocardial infarction: Secondary | ICD-10-CM | POA: Diagnosis not present

## 2016-02-01 DIAGNOSIS — E785 Hyperlipidemia, unspecified: Secondary | ICD-10-CM

## 2016-02-01 DIAGNOSIS — I10 Essential (primary) hypertension: Secondary | ICD-10-CM | POA: Diagnosis not present

## 2016-02-01 DIAGNOSIS — Z006 Encounter for examination for normal comparison and control in clinical research program: Secondary | ICD-10-CM

## 2016-02-01 DIAGNOSIS — E784 Other hyperlipidemia: Secondary | ICD-10-CM | POA: Diagnosis not present

## 2016-02-01 DIAGNOSIS — I2583 Coronary atherosclerosis due to lipid rich plaque: Secondary | ICD-10-CM

## 2016-02-01 LAB — COMPREHENSIVE METABOLIC PANEL
ALT: 19 U/L (ref 9–46)
AST: 17 U/L (ref 10–40)
Albumin: 4.3 g/dL (ref 3.6–5.1)
Alkaline Phosphatase: 57 U/L (ref 40–115)
BUN: 15 mg/dL (ref 7–25)
CO2: 28 mmol/L (ref 20–31)
Calcium: 9.3 mg/dL (ref 8.6–10.3)
Chloride: 104 mmol/L (ref 98–110)
Creat: 0.97 mg/dL (ref 0.60–1.35)
Glucose, Bld: 101 mg/dL — ABNORMAL HIGH (ref 65–99)
Potassium: 4.2 mmol/L (ref 3.5–5.3)
Sodium: 140 mmol/L (ref 135–146)
Total Bilirubin: 0.4 mg/dL (ref 0.2–1.2)
Total Protein: 7.1 g/dL (ref 6.1–8.1)

## 2016-02-01 LAB — LIPID PANEL
Cholesterol: 167 mg/dL (ref <200)
HDL: 35 mg/dL — ABNORMAL LOW (ref >40)
LDL Cholesterol: 115 mg/dL — ABNORMAL HIGH (ref <100)
Total CHOL/HDL Ratio: 4.8 Ratio (ref <5.0)
Triglycerides: 83 mg/dL (ref <150)
VLDL: 17 mg/dL (ref <30)

## 2016-02-01 MED ORDER — ATORVASTATIN CALCIUM 80 MG PO TABS: 80.0000 mg | ORAL_TABLET | Freq: Every day | ORAL | 11 refills | Status: DC

## 2016-02-01 NOTE — Progress Notes (Signed)
Cardiology Office Note    Date:  02/01/2016   ID:  Raymond Glassmanatrick D Jalomo, DOB [birth year], MRN [id number]  PCP:  Evette GeorgesDD,JEFFREY ALLEN, MD  Cardiologist: Dr. Mayford Knifeurner  Chief Complaint  Patient presents with  . Hospitalization Follow-up    s/p NSTEMI    History of Present Illness:    Raymond Lutz is a 50 y.o. male with past medical history of CAD (cath in 2013 showing 50% OM stenosis with mild CAD in LAD and RCA), HTN, HLD, Type 2 DM, CPAP (non-compliant) and morbid obesity who presents to the office today for hospital follow-up.   He was recently admitted from 10/5 - 12/18/2015 for an NSTEMI. He initially presented with a left-sided chest pressure which was worse with exertion and would last for 10-15 minute intervals. Initial troponin was elevated at 0.66 (peaked at 1.04 during admission) but EKG was without acute ischemic changes. Lipid Panel showed total cholesterol 186, HDL 35, and LDL 120. Echo showed preserved EF of 60-65% with mod dilated L and mild TR. He was taken for a cardiac catheterization on 10/6 which showed 2-vessel disease with 99% stenosis in the distal LAD (PROMUS PREM MR 2.25X28 drug eluting stent placed) and 99% stenosis 2nd Mrg (PROMUS PREM MR 2.25X8 DES was successfully placed). There was an apical LAD stenosis which was too small for PCI along with mild nonobstructive disease in the RCA. He tolerated the procedure well and no immediate complications were noted. He enrolled in the TWILIGHT study and is currently on ASA and Brilinta along with a high-intensity statin, Lisinopril, Lasix, and Lopressor.   This is the first time he has been seen in the office since his MI. He reports doing well since his recent hospitalization. 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 He has been walking at home and very frequently at work with no chest discomfort or dyspnea with exertion.   He reports good  compliance with his medications, having not missed any doses of ASA or Brilinta. Denies any evidence of active bleeding. Has been checking his BP at home and reports systolic readings are remaining in the 130's. He denies any orthopnea, PND, or lower extremity edema.    Past Medical History:  Diagnosis Date  . Arthritis   . Asthma   . Coronary artery disease    a. 2013: cath showing nonobstructive CAD with 50% OM, 20-30% dLAD, 30-40%dRCA  b. 12/2015: NSTEMI w/ 99% stenosis dLAD (DES placed), 99% stenosis 2nd Mrg (DES placed), apical LAD stenosis too small for PCI and mild disease in the RCA.   Marland Kitchen. DIABETES MELLITUS, TYPE II 10/17/2006  . ED (erectile dysfunction)   . GERD (gastroesophageal reflux disease)   . HYPERLIPIDEMIA 10/17/2006  . HYPERTENSION 10/17/2006  . OBESITY 10/17/2006  . Paroxysmal atrial fibrillation (HCC)    2006 - was on coumadin - took himself off 6 mos after initiation.  . Shortness of breath   . Sleep apnea    not using CPAP regularly    Past Surgical History:  Procedure Laterality Date  . arthroscopic knee    . CARDIAC CATHETERIZATION    . CARDIAC CATHETERIZATION N/A 12/17/2015   Procedure: Left Heart Cath and Coronary Angiography;  Surgeon: Kathleene Hazelhristopher D McAlhany, MD;  [facility];  Service: Cardiovascular;  Laterality: N/A;  . CARDIAC CATHETERIZATION N/A 12/17/2015   Procedure: Coronary Stent Intervention;  Surgeon: Kathleene Hazelhristopher D McAlhany, MD;  Location: [facility] INVASIVE CV LAB;  Service: Cardiovascular;  Laterality: N/A;  . HERNIA REPAIR    . HERNIA REPAIR    . LEFT HEART CATHETERIZATION WITH CORONARY ANGIOGRAM N/A 05/03/2011   Procedure: LEFT HEART CATHETERIZATION WITH CORONARY ANGIOGRAM;  Surgeon: Iran OuchMuhammad A Arida, MD;  Location: [facility] CATH LAB;  Service: Cardiovascular;  Laterality: N/A;  . skin grafts      Current Medications: Outpatient Medications Prior to Visit  Medication Sig Dispense Refill  . albuterol (VENTOLIN HFA) 108 (90 BASE) MCG/ACT inhaler  Inhale 2 puffs into the lungs every 6 (six) hours as needed. 1 Inhaler 1  . AMBULATORY NON FORMULARY MEDICATION Take 90 mg by mouth 2 (two) times daily. Medication Name: Brilinta 90 mg BID (TWILIGHT Research study PROVIDED)    . AMBULATORY NON FORMULARY MEDICATION Take 81 mg by mouth daily. Medication Name: Aspirin 81 mg Daily (TWILIGHT Research study PROVIDED)    . BOOSTRIX 5-2.5-18.5 injection     . furosemide (LASIX) 40 MG tablet Take 1 tablet (40 mg total) by mouth 2 (two) times daily. 60 tablet 11  . glipiZIDE (GLUCOTROL) 5 MG tablet Take 1 tablet (5 mg total) by mouth 2 (two) times daily before a meal. 60 tablet 11  . glucose blood test strip 1 each by Other route. Use as instructed     . lisinopril (PRINIVIL,ZESTRIL) 40 MG tablet Take 1 tablet (40 mg total) by mouth daily. 90 tablet 0  . metFORMIN (GLUCOPHAGE-XR) 750 MG 24 hr tablet Take 1 tablet (750 mg total) by mouth daily with breakfast. 30 tablet 11  . metoprolol tartrate (LOPRESSOR) 50 MG tablet Take 1 tablet (50 mg total) by mouth 2 (two) times daily. 60 tablet 6  . nitroGLYCERIN (NITROSTAT) 0.4 MG SL tablet Place 1 tablet (0.4 mg total) under the tongue every 5 (five) minutes x 3 doses as needed for chest pain. 25 tablet 3  . potassium chloride SA (K-DUR,KLOR-CON) 20 MEQ tablet Take 2 tablets (40 mEq total) by mouth every morning. 60 tablet 11  . atorvastatin (LIPITOR) 80 MG tablet Take 1 tablet (80 mg total) by mouth at bedtime. 30 tablet 0   No facility-administered medications prior to visit.      Allergies:   Patient has no known allergies.   Social History   Social History  . Marital status: Married    Spouse name: N/A  . Number of children: N/A  . Years of education: N/A   Social History Main Topics  . Smoking status: Never Smoker  . Smokeless tobacco: Never Used  . Alcohol use No  . Drug use: No  . Sexual activity: Yes   Other Topics Concern  . 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   [place] in [place] - 2 children - 13, 10.     Family History:  The patient's family history includes Cerebral aneurysm in his mother; Diabetes in his maternal grandmother; Heart attack in his brother and sister; Hypertension in his brother; Lung cancer in his father.   Review of Systems:   Please see the history of present illness.     General:  No chills, fever, night sweats or weight changes.  Cardiovascular:  No chest pain, dyspnea on exertion, edema, orthopnea, palpitations, paroxysmal nocturnal dyspnea. Dermatological: No rash, lesions/masses Respiratory: No cough, dyspnea Urologic: No hematuria, dysuria Abdominal:   No nausea, vomiting, diarrhea, bright red blood per rectum, melena, or hematemesis Neurologic:  No visual changes,  wkns, changes in mental status. All other systems reviewed and are otherwise negative except as noted above.   Physical Exam:    VS:  BP 138/86   Pulse 63   Ht 6\' 1"  (1.854 m)   Wt (!) 338 lb 9.6 oz (153.6 kg)   BMI 44.67 kg/m    General: Overweight African American male appearing in no acute distress. Head: Normocephalic, atraumatic, sclera non-icteric, no xanthomas, nares are without discharge.  Neck: No carotid bruits. JVD not elevated.  Lungs: Respirations regular and unlabored, without wheezes or rales.  Heart: Regular rate and rhythm. No S3 or S4.  No murmur, no rubs, or gallops appreciated. Abdomen: Soft, non-tender, non-distended with normoactive bowel sounds. No hepatomegaly. No rebound/guarding. No obvious abdominal masses. Msk:  Strength and tone appear normal for age. No joint deformities or effusions. Extremities: No clubbing or cyanosis. No edema.  Distal pedal pulses are 2+ bilaterally. Neuro: Alert and oriented X 3. Moves all extremities spontaneously. No focal deficits noted. Psych:  Responds to questions appropriately with a normal affect. Skin: No rashes or lesions noted  Wt Readings from Last 3 Encounters:  02/01/16 (!)  338 lb 9.6 oz (153.6 kg)  12/18/15 (!) 393 lb 8.3 oz (178.5 kg)  12/16/15 (!) 407 lb 12.8 oz (185 kg)    Studies/Labs Reviewed:   EKG:  EKG is ordered today. The EKG ordered today demonstrates NSR, HR 63, with no acute ST or T-wave changes. QTc 419 ms.   Recent Labs: 12/18/2015: BUN 10; Creatinine, Ser 0.92; Hemoglobin 12.6; Platelets 296; Potassium 3.6; Sodium 137   Lipid Panel    Component Value Date/Time   CHOL 186 12/17/2015 0827   CHOL 177 02/08/2014 0422   TRIG 154 (H) 12/17/2015 0827   TRIG 147 02/08/2014 0422   HDL 35 (L) 12/17/2015 0827   HDL 30 (L) 02/08/2014 0422   CHOLHDL 5.3 12/17/2015 0827   VLDL 31 12/17/2015 0827   VLDL 29 02/08/2014 0422   LDLCALC 120 (H) 12/17/2015 0827   LDLCALC 118 (H) 02/08/2014 0422   LDLDIRECT 171.0 06/24/2012 1146    Additional studies/ records that were reviewed today include:   Echocardiogram: 12/17/2015 Study Conclusions  - Left ventricle: The cavity size was normal. Wall thickness was   increased in a pattern of severe LVH. Systolic function was   normal. The estimated ejection fraction was in the range of 60%   to 65%. Left ventricular diastolic function parameters were   normal. - Left atrium: The atrium was moderately dilated. - Atrial septum: No defect or patent foramen ovale was identified.  Cardiac Catheterization: 12/17/2015  Dist RCA lesion, 30 %stenosed.  Prox RCA to Mid RCA lesion, 10 %stenosed.  2nd Mrg-1 lesion, 99 %stenosed.  RPDA lesion, 20 %stenosed.  Ost Cx to Prox Cx lesion, 10 %stenosed.  Dist LAD-2 lesion, 99 %stenosed.  A STENT PROMUS PREM MR 2.25X28 drug eluting stent was successfully placed.  2nd Mrg-2 lesion, 99 %stenosed.  Post intervention, there is a 0% residual stenosis.  A STENT PROMUS PREM MR 2.25X8 drug eluting stent was successfully placed.  Dist LAD-1 lesion, 99 %stenosed.  Post intervention, there is a 0% residual stenosis.   1. Double vessel CAD 2. NSTEMI 3. Severe  stenosis second obtuse marginal branch. Tandem 99% stenoses in OM2. Successful PTCA/DES x 1 OM2. 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  4. Severe stenosis distal LAD treated successfully with PTCA/DES x 1. The apical LAD stenosis  is in the very small apical LAD and is too small for PCI.  5. Mild non-obstructive disease in the RCA  Recommendations: Will need DAPT with ASA and Brilinta for one year. Continue beta blocker and statin.    Assessment:    1. Non-ST elevation myocardial infarction (NSTEMI), subsequent episode of care (HCC)   2. Coronary artery disease due to lipid rich plaque   3. Essential hypertension   4. Hyperlipidemia, unspecified hyperlipidemia type   5. Non-insulin dependent type 2 diabetes mellitus (HCC)   6. Research study patient   7. Morbid obesity (HCC)      Plan:   In order of problems listed above:  1. Subsequent Episode of Care for NSTEMI/ CAD - recently admitted from 10/5 - 12/18/2015 for an NSTEMI. Cath showed 2-vessel disease with 99% stenosis in the distal LAD (PROMUS PREM MR 2.25X28 DES placed) and 99% stenosis 2nd Mrg (PROMUS PREM MR 2.25X8 DES placed). There was an apical LAD stenosis which was too small for PCI along with mild nonobstructive disease in the RCA.  - denies any recurrence of chest discomfort or dyspnea with exertion. EKG today is without acute ischemic changes.  - enrolled in the TWILIGHT study and is currently on ASA and Brilinta. Tolerating these with no evidence of active bleeding.  - continue ASA, Brilinta, high-intensity statin, BB, and ACE-I.   2. Essential HTN - BP well-controlled. - continue Lisinopril, Lopressor, and Lasix. Check creatinine today to assess kidney function.   3. HLD - Lipid Panel on 12/17/2015 showed total cholesterol 186, HDL 35, and LDL 120. - Lipitor increased from 40mg  daily to 80mg  daily during hospitalization. He is fasting today and with it being 6 weeks from his last lab  work, will check FLP and LFT's today.   4. Type 2 DM - followed by PCP. Remains on Glipizide and Metformin.   5. Research Study - currently enrolled in the TWILIGHT study.  - reports good compliance with ASA and Brilinta.   6. Morbid Obesity - weight at 393 lbs in 12/2015, at 338 lbs today. Unsure of the accuracy of this. Recheck at follow-up.  - importance of diet and regular exercise was reviewed with the patient. Says he is consuming more grilled food and avoiding fried foods.    Medication Adjustments/Labs and Tests Ordered: Current medicines are reviewed at length with the patient today.  Concerns regarding medicines are outlined above.  Medication changes, Labs and Tests ordered today are listed in the Patient Instructions below. Patient Instructions  Medication Instructions:  Your physician recommends that you continue on your current medications as directed. Please refer to the Current Medication list given to you today.   Labwork: TODAY:  CMET & LIPID  Testing/Procedures: None ordered  Follow-Up: Your physician wants you to follow-up in: 6 MONTHS WITH DR. Sherlyn Lick will receive a reminder letter in the mail two months in advance. If you don't receive a letter, please call our office to schedule the follow-up appointment.  Any Other Special Instructions Will Be Listed Below (If Applicable).  If you need a refill on your cardiac medications before your next appointment, please call your pharmacy.   Lorri Frederick, PA  02/01/2016 2:44 PM    [facility] [address] [place], [address] [place], [place]  [address] Phone: [phone]; Fax: [phone]  [address] [place], [place] [address] Phone: [phone]

## 2016-02-01 NOTE — Patient Instructions (Addendum)
Medication Instructions:  Your physician recommends that you continue on your current medications as directed. Please refer to the Current Medication list given to you today.   Labwork: TODAY:  CMET & LIPID  Testing/Procedures: None ordered  Follow-Up: Your physician wants you to follow-up in: 6 MONTHS WITH DR. Sherlyn LickURNER  You will receive a reminder letter in the mail two months in advance. If you don't receive a letter, please call our office to schedule the follow-up appointment.    Any Other Special Instructions Will Be Listed Below (If Applicable).   If you need a refill on your cardiac medications before your next appointment, please call your pharmacy.

## 2016-02-02 ENCOUNTER — Telehealth: Payer: Self-pay | Admitting: Cardiology

## 2016-02-02 NOTE — Telephone Encounter (Signed)
New message  ° ° °Pt verbalized that he is returning call for rn for labs °

## 2016-02-02 NOTE — Telephone Encounter (Signed)
Returned pts call and we discussed his recent lab results. Pt verbalized understanding.

## 2016-03-16 ENCOUNTER — Other Ambulatory Visit: Payer: Self-pay | Admitting: Emergency Medicine

## 2016-03-16 MED ORDER — LISINOPRIL 40 MG PO TABS: 40.0000 mg | ORAL_TABLET | Freq: Every day | ORAL | 0 refills | Status: DC

## 2016-03-23 ENCOUNTER — Other Ambulatory Visit: Payer: Self-pay | Admitting: Family Medicine

## 2016-03-23 MED ORDER — LISINOPRIL 40 MG PO TABS: 40.0000 mg | ORAL_TABLET | Freq: Every day | ORAL | 0 refills | Status: DC

## 2016-03-27 ENCOUNTER — Other Ambulatory Visit: Payer: Self-pay | Admitting: *Deleted

## 2016-03-27 ENCOUNTER — Encounter: Payer: Self-pay | Admitting: *Deleted

## 2016-03-27 DIAGNOSIS — Z006 Encounter for examination for normal comparison and control in clinical research program: Secondary | ICD-10-CM

## 2016-03-27 MED ORDER — AMBULATORY NON FORMULARY MEDICATION: 81.0000 mg | Freq: Every day | Status: DC

## 2016-03-27 NOTE — Progress Notes (Signed)
TWILIGHT Research Study month 3 randomization visit completed. Patient denies any bleeding or other adverse events. He states he has been compliant with research study provided medication. Today he as randomized to ASA 81mg  Daily or PLACEBO. The following bottle numbers were dispensed. [id number]; [id number]; [id number]. Patient was given instructions about the protocol. Questions were encouraged and answered.

## 2016-04-11 ENCOUNTER — Telehealth: Payer: Self-pay | Admitting: *Deleted

## 2016-04-11 NOTE — Telephone Encounter (Signed)
Left message for patient to call the research office for month 4 telephone follow up for the TWILIGHT Research study.

## 2016-04-20 ENCOUNTER — Encounter: Payer: Self-pay | Admitting: *Deleted

## 2016-04-20 DIAGNOSIS — Z006 Encounter for examination for normal comparison and control in clinical research program: Secondary | ICD-10-CM

## 2016-04-20 NOTE — Progress Notes (Signed)
TWILIGHT Research study month 4 telephone follow up completed. Patient denies any bleeding or other adverse events. He also states that he has been compliant with research provided medication. Next research required appointment is due no later than 02/AUG/2018. Questions encouraged and answered.

## 2016-05-02 ENCOUNTER — Telehealth (HOSPITAL_COMMUNITY): Payer: Self-pay | Admitting: Family Medicine

## 2016-05-02 ENCOUNTER — Encounter (HOSPITAL_COMMUNITY): Payer: Self-pay | Admitting: Family Medicine

## 2016-05-02 NOTE — Telephone Encounter (Signed)
Mailed letter with Cardiac Rehab Program along with my chart message... KJ  °

## 2016-05-08 ENCOUNTER — Telehealth (HOSPITAL_COMMUNITY): Payer: Self-pay | Admitting: *Deleted

## 2016-05-08 ENCOUNTER — Telehealth: Payer: Self-pay | Admitting: Cardiology

## 2016-05-08 ENCOUNTER — Encounter: Payer: Self-pay | Admitting: Cardiology

## 2016-05-08 ENCOUNTER — Ambulatory Visit (INDEPENDENT_AMBULATORY_CARE_PROVIDER_SITE_OTHER): Payer: BLUE CROSS/BLUE SHIELD | Admitting: Cardiology

## 2016-05-08 VITALS — BP 164/94 | HR 60 | Ht 74.0 in | Wt 389.1 lb

## 2016-05-08 DIAGNOSIS — I10 Essential (primary) hypertension: Secondary | ICD-10-CM | POA: Diagnosis not present

## 2016-05-08 DIAGNOSIS — E7800 Pure hypercholesterolemia, unspecified: Secondary | ICD-10-CM | POA: Diagnosis not present

## 2016-05-08 DIAGNOSIS — G4733 Obstructive sleep apnea (adult) (pediatric): Secondary | ICD-10-CM | POA: Diagnosis not present

## 2016-05-08 DIAGNOSIS — R079 Chest pain, unspecified: Secondary | ICD-10-CM

## 2016-05-08 DIAGNOSIS — I2510 Atherosclerotic heart disease of native coronary artery without angina pectoris: Secondary | ICD-10-CM

## 2016-05-08 MED ORDER — AMLODIPINE BESYLATE 5 MG PO TABS: 5.0000 mg | ORAL_TABLET | Freq: Every day | ORAL | 11 refills | Status: DC

## 2016-05-08 NOTE — Telephone Encounter (Signed)
Left message on voicemail in reference to upcoming appointment scheduled for 05/09/16. Phone number given for a call back so details instructions can be given. Sharon S Brooks ° ° °

## 2016-05-08 NOTE — Telephone Encounter (Signed)
Patient given detailed instructions per Myocardial Perfusion Study Information Sheet for the test on 05/09/16 at 1230. Patient notified to arrive 15 minutes early and that it is imperative to arrive on time for appointment to keep from having the test rescheduled.  If you need to cancel or reschedule your appointment, please call the office within 24 hours of your appointment. Failure to do so may result in a cancellation of your appointment, and a $50 no show fee. Patient verbalized understanding.Abriel Hattery, Adelene IdlerCynthia W

## 2016-05-08 NOTE — Telephone Encounter (Signed)
Called patient.  Patient stated that he was having some chest tightness.  Says he isn't having chest pain.  He said that he had chest pain before he had his stents placed.  Says it is off and on since yesterday.  Says he walked from the car to the grocery store yesterday and his heart rate went up but came down in a couple of minutes. Denied sob or dizziness.  Says he wants to be checked "better safe than sorry".  B/p 167/85, hr 73 this morning, last wt 338lb

## 2016-05-08 NOTE — Patient Instructions (Addendum)
Medication Instructions:  1) START AMLODIPINE 5 mg daily  Labwork: TODAY: LFTs, Lipids  Testing/Procedures: Your physician has recommended that you have a sleep study. This test records several body functions during sleep, including: brain activity, eye movement, oxygen and carbon dioxide blood levels, heart rate and rhythm, breathing rate and rhythm, the flow of air through your mouth and nose, snoring, body muscle movements, and chest and belly movement.   Dr. Mayford Knifeurner recommends you have a NUCLEAR STRESS TEST.  Follow-Up: Your physician recommends that you schedule a follow-up appointment in the [facility] in 1 week.  Your physician wants you to follow-up in: 6 months with Dr. Mayford Knifeurner. You will receive a reminder letter in the mail two months in advance. If you don't receive a letter, please call our office to schedule the follow-up appointment.   Any Other Special Instructions Will Be Listed Below (If Applicable).     If you need a refill on your cardiac medications before your next appointment, please call your pharmacy.

## 2016-05-08 NOTE — Telephone Encounter (Signed)
new message    Pt verbalized that he is having tightness in his chest and NOT chest pain ( I asked pt twice)  It started yesterday, I added pt in Dr.Turners 1pm slot

## 2016-05-08 NOTE — Progress Notes (Signed)
Cardiology Office Note    Date:  05/08/2016   ID:  Raymond Lutz, DOB 1965-03-30, MRN [id number]  PCP:  Evette GeorgesDD,JEFFREY ALLEN, MD  Cardiologist:  Armanda Magicraci Turner, MD   Chief Complaint  Patient presents with  . Chest Pain  . Coronary Artery Disease  . Hypertension    History of Present Illness:  Raymond Lutz is a 51 y.o. male with a history of type II DM, dyslipidemia, HTN, PVD, PAF, nonobstructive ASCAD with 50% OM, 20-30% dLAD, 30-40%dRCA at cath 2013.  His last cath in the setting of NSTEMI was 12/17/2015 which showed 30% dRCA, 99% OM2, 99% distal LAD and underwent DES to the distal LAD and OM2 and he is on DPAT with ASA and Brilinta. There was an apical LAD stenosis which was too small for PCI along with mild nonobstructive disease in the RCA.  He called the office today stating that he has been having episodic CP for 2 days.  He says that the pain is more of a pressure.  It occurred around 7pm while sitting and was located over his left breast with no radiation.  This has not had any exertional symptoms.  He says this pain is not like his prior anginal pain in that he has not had any sharp pains with it.  He says on Friday his heart rate went up to 114bpm and then went back down in them in 60's but was not feeling bad.  He is very aware of his heart rate fluctuating with activity but has never been higher than 114bpm when he exerts himself.  He denies any SOB, DOE, palpitations, dizziness or syncope.  He occasionally has LE edema but is on his feet all day.       Past Medical History:  Diagnosis Date  . Arthritis   . Asthma   . Coronary artery disease    a. 2013: cath showing nonobstructive CAD with 50% OM, 20-30% dLAD, 30-40%dRCA  b. 12/2015: NSTEMI w/ 99% stenosis dLAD (DES placed), 99% stenosis 2nd Mrg (DES placed), apical LAD stenosis too small for PCI and mild disease in the RCA.   Marland Kitchen. DIABETES MELLITUS, TYPE II 10/17/2006  . ED (erectile dysfunction)   . GERD (gastroesophageal  reflux disease)   . HYPERLIPIDEMIA 10/17/2006  . HYPERTENSION 10/17/2006  . OBESITY 10/17/2006  . Paroxysmal atrial fibrillation (HCC)    2006 - was on coumadin - took himself off 6 mos after initiation.  . Shortness of breath   . Sleep apnea    not using CPAP regularly    Past Surgical History:  Procedure Laterality Date  . arthroscopic knee    . CARDIAC CATHETERIZATION    . CARDIAC CATHETERIZATION N/A 12/17/2015   Procedure: Left Heart Cath and Coronary Angiography;  Surgeon: Kathleene Hazelhristopher D McAlhany, MD;  [facility];  Service: Cardiovascular;  Laterality: N/A;  . CARDIAC CATHETERIZATION N/A 12/17/2015   Procedure: Coronary Stent Intervention;  Surgeon: Kathleene Hazelhristopher D McAlhany, MD;  Location: [facility] INVASIVE CV LAB;  Service: Cardiovascular;  Laterality: N/A;  . HERNIA REPAIR    . HERNIA REPAIR    . LEFT HEART CATHETERIZATION WITH CORONARY ANGIOGRAM N/A 05/03/2011   Procedure: LEFT HEART CATHETERIZATION WITH CORONARY ANGIOGRAM;  Surgeon: Iran OuchMuhammad A Arida, MD;  Location: [facility] CATH LAB;  Service: Cardiovascular;  Laterality: N/A;  . skin grafts      Current Medications: Current Meds  Medication Sig  . albuterol (VENTOLIN HFA) 108 (90 BASE) MCG/ACT inhaler Inhale 2  puffs into the lungs every 6 (six) hours as needed.  . AMBULATORY NON FORMULARY MEDICATION Take 90 mg by mouth 2 (two) times daily. Medication Name: Brilinta 90 mg BID (TWILIGHT Research study PROVIDED)  . AMBULATORY NON FORMULARY MEDICATION Take 81 mg by mouth daily. Medication Name: ASPIRIN 81 mg or PLACEBO (TWILIGHT Research study PROVIDED)  . atorvastatin (LIPITOR) 80 MG tablet Take 1 tablet (80 mg total) by mouth at bedtime.  Marland Kitchen BOOSTRIX 5-2.5-18.5 injection   . furosemide (LASIX) 40 MG tablet Take 1 tablet (40 mg total) by mouth 2 (two) times daily.  Marland Kitchen glipiZIDE (GLUCOTROL) 5 MG tablet Take 1 tablet (5 mg total) by mouth 2 (two) times daily before a meal.  . glucose blood test strip 1 each by Other route. Use as  instructed   . lisinopril (PRINIVIL,ZESTRIL) 40 MG tablet Take 1 tablet (40 mg total) by mouth daily.  . metFORMIN (GLUCOPHAGE-XR) 750 MG 24 hr tablet Take 1 tablet (750 mg total) by mouth daily with breakfast.  . metoprolol tartrate (LOPRESSOR) 50 MG tablet Take 1 tablet (50 mg total) by mouth 2 (two) times daily.  . nitroGLYCERIN (NITROSTAT) 0.4 MG SL tablet Place 1 tablet (0.4 mg total) under the tongue every 5 (five) minutes x 3 doses as needed for chest pain.  . potassium chloride SA (K-DUR,KLOR-CON) 20 MEQ tablet Take 2 tablets (40 mEq total) by mouth every morning.    Allergies:   Patient has no known allergies.   Social History   Social History  . Marital status: Married    Spouse name: N/A  . Number of children: N/A  . Years of education: N/A   Social History Main Topics  . Smoking status: Never Smoker  . Smokeless tobacco: Never Used  . Alcohol use No  . Drug use: No  . Sexual activity: Yes   Other Topics Concern  . None   Social History Narrative   Works at [employer].  Lives in [place] with wife - 2 children - 13, 10.     Family History:  The patient's family history includes Cerebral aneurysm in his mother; Diabetes in his maternal grandmother; Heart attack in his brother and sister; Hypertension in his brother; Lung cancer in his father.   ROS:   Please see the history of present illness.    ROS All other systems reviewed and are negative.  No flowsheet data found.     PHYSICAL EXAM:   VS:  BP (!) 164/94   Pulse 60   Ht 6\' 2"  (1.88 m)   Wt (!) 389 lb 1.9 oz (176.5 kg)   BMI 49.96 kg/m    GEN: Well nourished, well developed, in no acute distress  HEENT: normal  Neck: no JVD, carotid bruits, or masses Cardiac: RRR; no murmurs, rubs, or gallops,no edema.  Intact distal pulses bilaterally.  Respiratory:  clear to auscultation bilaterally, normal work of breathing GI: soft, nontender, nondistended, + BS MS: no deformity or atrophy  Skin: warm and  dry, no rash Neuro:  Alert and Oriented x 3, Strength and sensation are intact Psych: euthymic mood, full affect  Wt Readings from Last 3 Encounters:  05/08/16 (!) 389 lb 1.9 oz (176.5 kg)  02/01/16 (!) 338 lb 9.6 oz (153.6 kg)  12/18/15 (!) 393 lb 8.3 oz (178.5 kg)      Studies/Labs Reviewed:   EKG:  EKG is ordered today.  The ekg ordered today demonstrates NSR at 60bpm with IRBBB and no  ST changes  Recent Labs: 12/18/2015: Hemoglobin 12.6; Platelets 296 02/01/2016: ALT 19; BUN 15; Creat 0.97; Potassium 4.2; Sodium 140   Lipid Panel    Component Value Date/Time   CHOL 167 02/01/2016 1126   CHOL 177 02/08/2014 0422   TRIG 83 02/01/2016 1126   TRIG 147 02/08/2014 0422   HDL 35 (L) 02/01/2016 1126   HDL 30 (L) 02/08/2014 0422   CHOLHDL 4.8 02/01/2016 1126   VLDL 17 02/01/2016 1126   VLDL 29 02/08/2014 0422   LDLCALC 115 (H) 02/01/2016 1126   LDLCALC 118 (H) 02/08/2014 0422   LDLDIRECT 171.0 06/24/2012 1146    Additional studies/ records that were reviewed today include:  none    ASSESSMENT:    1. Coronary artery disease involving native coronary artery of native heart without angina pectoris   2. Essential hypertension   3. Pure hypercholesterolemia   4. OSA (obstructive sleep apnea)      PLAN:  In order of problems listed above:  1. ASCAD with NSTEMI 12/16/2015. -  Cath showed 2-vessel disease with 99% stenosis in the distal LAD (PROMUS PREM MR 2.25X28 DES placed) and 99% stenosis 2nd Mrg (PROMUS PREM MR 2.25X8 DES placed). There was an apical LAD stenosis which was too small for PCI along with mild nonobstructive disease in the RCA.  - He is now having recurrence of chest discomfort although it is not the same quality. EKG today is without acute ischemic changes. He is in the window for restenosis so I will get a stress myoview to rule out ischemia.   - Start Imdur 30mg  daily since he does have an apical LAD stenosis too small for PCI - enrolled in the TWILIGHT  study and is currently on ASA and Brilinta. Tolerating these with no evidence of active bleeding.  - continue ASA, Brilinta, high-intensity statin, BB, and ACE-I.   2. Essential HTN - BP poorly controlled.  He says at home it has 168/58mmHg.   - continue Lisinopril, Lopressor, and Lasix. - cannot increase BB further due to borderline bradycardia. - I will start amlodipine 5mg  daily and followup in HTN clinic in 1 week. - check TSH - I will get a abdominal CT angio to rule out renal artery stenosis.    3. HLD - Lipid Panel on 12/17/2015 showed total cholesterol 186, HDL 35, and LDL 120. - Lipitor increased from 40mg  daily to 80mg  daily during hospitalization but LDL 115 on last check in November.  Will repeat FLP and ALT today.  4. Type 2 DM - followed by PCP. Remains on Glipizide and Metformin.   5. Research Study - currently enrolled in the TWILIGHT study.  - reports good compliance with ASA and Brilinta.   6. Morbid Obesity - weight at 389 lbs today.  - importance of diet and regular exercise was reviewed with the patient. Says he is consuming more grilled food and avoiding fried foods.   7.  OSA - noncompliant with his CPAP and this could be driving his HTN  - He has OSA but is not compliant with CPAP usage.  I encouraged him to use his device nightly.  He has not replaced his CPAP supplies in over 5 years and his device is > 10 years old.      Medication Adjustments/Labs and Tests Ordered: Current medicines are reviewed at length with the patient today.  Concerns regarding medicines are outlined above.  Medication changes, Labs and Tests ordered today are listed in the Patient Instructions  below.  There are no Patient Instructions on file for this visit.   Signed, Armanda Magic, MD  05/08/2016 1:46 PM    [facility] [address] [place], [place], [place]  [address] Phone: [phone]; Fax: [phone]

## 2016-05-09 ENCOUNTER — Ambulatory Visit (HOSPITAL_COMMUNITY): Payer: BLUE CROSS/BLUE SHIELD | Attending: Cardiovascular Disease

## 2016-05-09 DIAGNOSIS — R0789 Other chest pain: Secondary | ICD-10-CM | POA: Insufficient documentation

## 2016-05-09 DIAGNOSIS — R079 Chest pain, unspecified: Secondary | ICD-10-CM

## 2016-05-09 DIAGNOSIS — I10 Essential (primary) hypertension: Secondary | ICD-10-CM | POA: Diagnosis not present

## 2016-05-09 DIAGNOSIS — E119 Type 2 diabetes mellitus without complications: Secondary | ICD-10-CM | POA: Diagnosis not present

## 2016-05-09 DIAGNOSIS — I2510 Atherosclerotic heart disease of native coronary artery without angina pectoris: Secondary | ICD-10-CM | POA: Diagnosis not present

## 2016-05-09 DIAGNOSIS — I252 Old myocardial infarction: Secondary | ICD-10-CM | POA: Insufficient documentation

## 2016-05-09 LAB — HEPATIC FUNCTION PANEL
ALT: 24 IU/L (ref 0–44)
AST: 24 IU/L (ref 0–40)
Albumin: 4.5 g/dL (ref 3.5–5.5)
Alkaline Phosphatase: 70 IU/L (ref 39–117)
Bilirubin Total: 0.4 mg/dL (ref 0.0–1.2)
Bilirubin, Direct: 0.1 mg/dL (ref 0.00–0.40)
Total Protein: 7.5 g/dL (ref 6.0–8.5)

## 2016-05-09 LAB — LIPID PANEL
Chol/HDL Ratio: 4.6 ratio units (ref 0.0–5.0)
Cholesterol, Total: 169 mg/dL (ref 100–199)
HDL: 37 mg/dL — ABNORMAL LOW (ref >39)
LDL Calculated: 112 mg/dL — ABNORMAL HIGH (ref 0–99)
Triglycerides: 98 mg/dL (ref 0–149)
VLDL Cholesterol Cal: 20 mg/dL (ref 5–40)

## 2016-05-09 MED ORDER — REGADENOSON 0.4 MG/5ML IV SOLN
0.4000 mg | Freq: Once | INTRAVENOUS | Status: AC
  Administered 2016-05-09: 0.4 mg via INTRAVENOUS

## 2016-05-09 MED ORDER — TECHNETIUM TC 99M TETROFOSMIN IV KIT
32.8000 | PACK | Freq: Once | INTRAVENOUS | Status: AC | PRN
  Administered 2016-05-09: 32.8 via INTRAVENOUS
  Filled 2016-05-09: qty 33

## 2016-05-10 ENCOUNTER — Telehealth: Payer: Self-pay | Admitting: *Deleted

## 2016-05-10 ENCOUNTER — Ambulatory Visit (HOSPITAL_COMMUNITY): Payer: BLUE CROSS/BLUE SHIELD | Attending: Cardiovascular Disease

## 2016-05-10 DIAGNOSIS — G4733 Obstructive sleep apnea (adult) (pediatric): Secondary | ICD-10-CM

## 2016-05-10 LAB — MYOCARDIAL PERFUSION IMAGING
LV dias vol: 243 mL (ref 62–150)
LV sys vol: 125 mL
Peak HR: 94 {beats}/min
RATE: 0.37
Rest HR: 73 {beats}/min
SDS: 1
SRS: 4
SSS: 5
TID: 1.02

## 2016-05-10 MED ORDER — TECHNETIUM TC 99M TETROFOSMIN IV KIT
32.7000 | PACK | Freq: Once | INTRAVENOUS | Status: AC | PRN
  Administered 2016-05-10: 32.7 via INTRAVENOUS
  Filled 2016-05-10: qty 33

## 2016-05-10 NOTE — Telephone Encounter (Signed)
Scheduled

## 2016-05-10 NOTE — Telephone Encounter (Signed)
-----   Message from Henrietta DineKathryn A Kemp, RN sent at 05/08/2016  3:23 PM EST ----- Regarding: PSG PSG ordered for scheduling  Thanks!!

## 2016-05-11 ENCOUNTER — Encounter: Payer: Self-pay | Admitting: Cardiology

## 2016-05-11 ENCOUNTER — Telehealth: Payer: Self-pay | Admitting: Cardiology

## 2016-05-11 DIAGNOSIS — R079 Chest pain, unspecified: Secondary | ICD-10-CM

## 2016-05-11 DIAGNOSIS — E785 Hyperlipidemia, unspecified: Secondary | ICD-10-CM

## 2016-05-11 MED ORDER — EZETIMIBE 10 MG PO TABS: 10.0000 mg | ORAL_TABLET | Freq: Every day | ORAL | 11 refills | Status: DC

## 2016-05-11 NOTE — Telephone Encounter (Signed)
Patient states he took 1 NTG tab and the pain resolved.  He states he just wanted to know if he should stay home from work today. He will go to the ED if symptoms recur.

## 2016-05-11 NOTE — Telephone Encounter (Signed)
-----   Message from Quintella Reichertraci R Turner, MD sent at 05/09/2016  8:26 AM EST ----- LDL not at goal - add Zetia 10mg  daily and repeat FLP and ALT in 6 weeks

## 2016-05-11 NOTE — Telephone Encounter (Signed)
The patient reports his chest hurt for about an hour prior to taking NTG this AM. It quickly resolved with 1 NTG tablet and he has not had any symptoms since. He understands to take NTG if symptoms return, and to immediately call 911 if symptoms do not subside. He will call the office if symptoms return as well.

## 2016-05-11 NOTE — Telephone Encounter (Signed)
-----   Message from Quintella Reichertraci R Turner, MD sent at 05/11/2016  9:23 AM EST ----- No ischemia no stress test.  The EF is mildly reduced at 48%.  Please get echo to get another assessment of EF

## 2016-05-11 NOTE — Telephone Encounter (Signed)
He needs to go to ER for evaluation 

## 2016-05-11 NOTE — Telephone Encounter (Signed)
New message      Pt received stress test results on mychart.  He has questions.  Please call

## 2016-05-11 NOTE — Telephone Encounter (Signed)
Informed patient of results and verbal understanding expressed.  Instructed patient to START ZETIA 10 mg daily. FLP and ALT scheduled 4/20. ECHO ordered for scheduling. Patient agrees with treatment plan.  Patient reports he had chest tightness so bad last night he took a Nitro. The sensation resolved.  This AM, he also has some of the same tightness [facility] Instructed him to take Nitro to see if it resolves. He understands he can take every 5 minutes up to 3 doses and if the tightness does not resolve to go to the ED or call 911. He understands he will be called if Dr. Mayford Knifeurner has further recommendations.

## 2016-05-11 NOTE — Telephone Encounter (Signed)
How long did pain last

## 2016-05-14 NOTE — Telephone Encounter (Signed)
This is likely due to the apical LAD stenosis that was not amenable to PCI.  Please start Imdur 30mg  daily and stress test showed no ischemia.  Please have him seen an extender in 1 week.  If he continues to have chest pain will have to recath.  Please inform him not to take any erectile dysfunction drugs due to the interaction with nitrates.

## 2016-05-15 ENCOUNTER — Ambulatory Visit: Payer: BLUE CROSS/BLUE SHIELD

## 2016-05-15 NOTE — Telephone Encounter (Signed)
Left message to call back  

## 2016-05-16 MED ORDER — ISOSORBIDE MONONITRATE ER 30 MG PO TB24: 30.0000 mg | ORAL_TABLET | Freq: Every day | ORAL | 11 refills | Status: DC

## 2016-05-16 NOTE — Addendum Note (Signed)
Addended by: Gunnar FusiKEMP, Tres Grzywacz A on: 05/16/2016 09:08 AM   Modules accepted: Orders

## 2016-05-16 NOTE — Telephone Encounter (Signed)
Instructed patient to START IMDUR 30 mg daily. Scheduled patient with Nada BoozerLaura Ingold, NP 3/14 as patient can only come Wednesdays. HTN Clinic appointment cancelled as Vernona RiegerLaura can address BP issues at OV. Patient understands he cannot take any erectile dysfunction drugs due to interaction with nitrates. He was grateful for call and agrees with treatment plan.

## 2016-05-24 ENCOUNTER — Ambulatory Visit: Payer: BLUE CROSS/BLUE SHIELD

## 2016-05-24 ENCOUNTER — Ambulatory Visit (INDEPENDENT_AMBULATORY_CARE_PROVIDER_SITE_OTHER): Payer: BLUE CROSS/BLUE SHIELD | Admitting: Cardiology

## 2016-05-24 ENCOUNTER — Encounter: Payer: Self-pay | Admitting: Cardiology

## 2016-05-24 VITALS — BP 140/84 | HR 66 | Ht 74.0 in | Wt 379.0 lb

## 2016-05-24 DIAGNOSIS — I2510 Atherosclerotic heart disease of native coronary artery without angina pectoris: Secondary | ICD-10-CM

## 2016-05-24 DIAGNOSIS — G4733 Obstructive sleep apnea (adult) (pediatric): Secondary | ICD-10-CM

## 2016-05-24 DIAGNOSIS — R079 Chest pain, unspecified: Secondary | ICD-10-CM | POA: Diagnosis not present

## 2016-05-24 DIAGNOSIS — I10 Essential (primary) hypertension: Secondary | ICD-10-CM

## 2016-05-24 NOTE — Progress Notes (Signed)
Cardiology Office Note   Date:  05/24/2016   ID:  Raymond Lutz, DOB 03/30/1965, MRN [id number]  PCP:  Evette Georges, MD  Cardiologist:  Dr. Mayford Knife    Chief Complaint  Patient presents with  . Chest Pain      History of Present Illness: Raymond Lutz is a 51 y.o. male who presents for HTN, and review of nuc study which is without reversible ischemia, but Ef is 48%  with mild global hypokinesis and moderate LV dilitation. This is an intermediate risk study.  Echo is pending.  Pt called with chest pain, and Dr. Mayford Knife felt this was due to apical LAD stenosis that was not amenable to PCI.  She began Imdur daily- with plan to recath if pain continues.   Lat cath was in Oct 2017 with NSTEMI and double vessel CAD.  Severe stenosis second obtuse marginal branch. Tandem 99% stenoses in OM2. Successful PTCA/DES x 1 OM2. 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  Severe stenosis distal LAD treated successfully with PTCA/DES x 1. The apical LAD stenosis is in the very small apical LAD and is too small for PCI.  Mild non obstructive disease in the RCA.  On Brilinta and ASA.    Today on Imdur, no further chest pain.  Feels good, has lost 9 lbs and is walking more than his usual.  Eating more vegetables.       Past Medical History:  Diagnosis Date  . Arthritis   . Asthma   . Coronary artery disease    a. 2013: cath showing nonobstructive CAD with 50% OM, 20-30% dLAD, 30-40%dRCA  b. 12/2015: NSTEMI w/ 99% stenosis dLAD (DES placed), 99% stenosis 2nd Mrg (DES placed), apical LAD stenosis too small for PCI and mild disease in the RCA.   Marland Kitchen DIABETES MELLITUS, TYPE II 10/17/2006  . ED (erectile dysfunction)   . GERD (gastroesophageal reflux disease)   . HYPERLIPIDEMIA 10/17/2006  . HYPERTENSION 10/17/2006  . OBESITY 10/17/2006  . Paroxysmal atrial fibrillation (HCC)    2006 - was on coumadin - took himself off 6 mos after initiation.  . Shortness of breath   .  Sleep apnea    not using CPAP regularly    Past Surgical History:  Procedure Laterality Date  . arthroscopic knee    . CARDIAC CATHETERIZATION    . CARDIAC CATHETERIZATION N/A 12/17/2015   Procedure: Left Heart Cath and Coronary Angiography;  Surgeon: Kathleene Hazel, MD;  Location: [facility] INVASIVE CV LAB;  Service: Cardiovascular;  Laterality: N/A;  . CARDIAC CATHETERIZATION N/A 12/17/2015   Procedure: Coronary Stent Intervention;  Surgeon: Kathleene Hazel, MD;  Location: [facility]AS[facility] CV LAB;  Service: Cardiovascular;  Laterality: N/A;  . HERNIA REPAIR    . HERNIA REPAIR    . LEFT HEART CATHETERIZATION WITH CORONARY ANGIOGRAM N/A 05/03/2011   Procedure: LEFT HEART CATHETERIZATION WITH CORONARY ANGIOGRAM;  Surgeon: Iran Ouch, MD;  Location: [facility] CATH LAB;  Service: Cardiovascular;  Laterality: N/A;  . skin grafts       Current Outpatient Prescriptions  Medication Sig Dispense Refill  . albuterol (VENTOLIN HFA) 108 (90 BASE) MCG/ACT inhaler Inhale 2 puffs into the lungs every 6 (six) hours as needed. 1 Inhaler 1  . AMBULATORY NON FORMULARY MEDICATION Take 90 mg by mouth 2 (two) times daily. Medication Name: Brilinta 90 mg BID (TWILIGHT Research study PROVIDED)    . AMBULATORY NON FORMULARY MEDICATION Take 81 mg by  mouth daily. Medication Name: ASPIRIN 81 mg or PLACEBO (TWILIGHT Research study PROVIDED)    . amLODipine (NORVASC) 5 MG tablet Take 1 tablet (5 mg total) by mouth daily. 30 tablet 11  . atorvastatin (LIPITOR) 80 MG tablet Take 1 tablet (80 mg total) by mouth at bedtime. 30 tablet 11  . BOOSTRIX 5-2.5-18.5 injection     . ezetimibe (ZETIA) 10 MG tablet Take 1 tablet (10 mg total) by mouth daily. 30 tablet 11  . furosemide (LASIX) 40 MG tablet Take 1 tablet (40 mg total) by mouth 2 (two) times daily. 60 tablet 11  . glipiZIDE (GLUCOTROL) 5 MG tablet Take 1 tablet (5 mg total) by mouth 2 (two) times daily before a meal. 60 tablet 11  . glucose blood test strip 1 each  by Other route. Use as instructed     . isosorbide mononitrate (IMDUR) 30 MG 24 hr tablet Take 1 tablet (30 mg total) by mouth daily. 30 tablet 11  . lisinopril (PRINIVIL,ZESTRIL) 40 MG tablet Take 1 tablet (40 mg total) by mouth daily. 90 tablet 0  . metFORMIN (GLUCOPHAGE-XR) 750 MG 24 hr tablet Take 1 tablet (750 mg total) by mouth daily with breakfast. 30 tablet 11  . metoprolol tartrate (LOPRESSOR) 50 MG tablet Take 1 tablet (50 mg total) by mouth 2 (two) times daily. 60 tablet 6  . nitroGLYCERIN (NITROSTAT) 0.4 MG SL tablet Place 1 tablet (0.4 mg total) under the tongue every 5 (five) minutes x 3 doses as needed for chest pain. 25 tablet 3  . potassium chloride SA (K-DUR,KLOR-CON) 20 MEQ tablet Take 2 tablets (40 mEq total) by mouth every morning. 60 tablet 11   No current facility-administered medications for this visit.     Allergies:   Patient has no known allergies.    Social History:  The patient  reports that he has never smoked. He has never used smokeless tobacco. He reports that he does not drink alcohol or use drugs.   Family History:  The patient's family history includes Cerebral aneurysm in his mother; Diabetes in his maternal grandmother; Heart attack in his brother and sister; Hypertension in his brother; Lung cancer in his father.    ROS:  General:no colds or fevers, + weight changes CV:see HPI PUL:see HPI GI:no diarrhea constipation or melena, no indigestion GU:no hematuria, no dysuria MS:no joint pain, no claudication  Wt Readings from Last 3 Encounters:  05/24/16 (!) 379 lb (171.9 kg)  05/09/16 (!) 389 lb (176.4 kg)  05/08/16 (!) 389 lb 1.9 oz (176.5 kg)     PHYSICAL EXAM: VS:  BP 140/84   Pulse 66   Ht 6\' 2"  (1.88 m)   Wt (!) 379 lb (171.9 kg)   BMI 48.66 kg/m  , BMI Body mass index is 48.66 kg/m. General:Pleasant affect, NAD Skin:Warm and dry, brisk capillary refill Neck:supple, no JVD, no bruits  Heart:S1S2 RRR without murmur, gallup, rub or  click Lungs:clear without rales, rhonchi, or wheezes ZOX:WRUEA, soft, non tender, + BS, do not palpate liver spleen or masses Ext:no lower ext edema, 2+ pedal pulses, 2+ radial pulses Neuro:alert and oriented X 3, MAE, follows commands, + facial symmetry    EKG:  EKG is NOT ordered today.    Recent Labs: 12/18/2015: Hemoglobin 12.6; Platelets 296 02/01/2016: BUN 15; Creat 0.97; Potassium 4.2; Sodium 140 05/08/2016: ALT 24    Lipid Panel    Component Value Date/Time   CHOL 169 05/08/2016 1416   CHOL 177 02/08/2014 0422  TRIG 98 05/08/2016 1416   TRIG 147 02/08/2014 0422   HDL 37 (L) 05/08/2016 1416   HDL 30 (L) 02/08/2014 0422   CHOLHDL 4.6 05/08/2016 1416   CHOLHDL 4.8 02/01/2016 1126   VLDL 17 02/01/2016 1126   VLDL 29 02/08/2014 0422   LDLCALC 112 (H) 05/08/2016 1416   LDLCALC 118 (H) 02/08/2014 0422   LDLDIRECT 171.0 06/24/2012 1146       Other studies Reviewed: Additional studies/ records that were reviewed today include: .  nuc study  05/10/16 6\' 2"  (1.88 m) 379 lb (171.9 kg) 48.8  Study Highlights     The left ventricular ejection fraction is mildly decreased (45-54%).  Nuclear stress EF: 48%.  No T wave inversion was noted during stress.  There was no ST segment deviation noted during stress.  This is an intermediate risk study.   No reversible ischemia. LVEF 48% with mild global hypokinesis and moderate LV dilitation. This is an intermediate risk study.     cath 12/2015 Procedures   Coronary Stent Intervention  Left Heart Cath and Coronary Angiography  Conclusion     Dist RCA lesion, 30 %stenosed.  Prox RCA to Mid RCA lesion, 10 %stenosed.  2nd Mrg-1 lesion, 99 %stenosed.  RPDA lesion, 20 %stenosed.  Ost Cx to Prox Cx lesion, 10 %stenosed.  Dist LAD-2 lesion, 99 %stenosed.  A STENT PROMUS PREM MR 2.25X28 drug eluting stent was successfully placed.  2nd Mrg-2 lesion, 99 %stenosed.  Post intervention, there is a 0% residual  stenosis.  A STENT PROMUS PREM MR 2.25X8 drug eluting stent was successfully placed.  Dist LAD-1 lesion, 99 %stenosed.  Post intervention, there is a 0% residual stenosis.   1. Double vessel CAD 2. NSTEMI 3. Severe stenosis second obtuse marginal branch. Tandem 99% stenoses in OM2. Successful PTCA/DES x 1 OM2. 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  4. Severe stenosis distal LAD treated successfully with PTCA/DES x 1. The apical LAD stenosis is in the very small apical LAD and is too small for PCI.  5. Mild non-obstructive disease in the RCA  Recommendations: Will need DAPT with ASA and Brilinta for one year. Continue beta blocker and statin.       ASSESSMENT AND PLAN:  1.  Angina with no ischemia on nuc study but EF 48% - echo pending.  Since adding Imdur no further chest pain.  Feels well.  Will follow up withDr. Mayford Knifeurner in 6 months unless chest pain returns.     2.  HTN controlled  3.  OSA wearing cpap  4.  Obesity eating more vegetables, wt down 9 lbs.     Current medicines are reviewed with the patient today.  The patient Has no concerns regarding medicines.  The following changes have been made:  See above Labs/ tests ordered today include:see above  Disposition:   FU:  see above  Signed, Nada BoozerLaura Channel Papandrea, NP  05/24/2016 2:51 PM     [facility] [address], [place], [place]  [address]/ [address] [place], [place] Phone: [phone]; Fax: [phone]  [phone]

## 2016-05-24 NOTE — Patient Instructions (Addendum)
Medication Instructions:  Your physician recommends that you continue on your current medications as directed. Please refer to the Current Medication list given to you today.   Labwork: None ordered  Testing/Procedures: Keep echocardiogram appointment on 05/31/16.  Follow-Up: Your physician wants you to follow-up in: 6 months with Dr. Mayford Knifeurner. You will receive a reminder letter in the mail two months in advance. If you don't receive a letter, please call our office to schedule the follow-up appointment.   Any Other Special Instructions Will Be Listed Below (If Applicable).     If you need a refill on your cardiac medications before your next appointment, please call your pharmacy.

## 2016-05-26 DIAGNOSIS — M1711 Unilateral primary osteoarthritis, right knee: Secondary | ICD-10-CM | POA: Diagnosis not present

## 2016-05-31 ENCOUNTER — Other Ambulatory Visit (HOSPITAL_COMMUNITY): Payer: BLUE CROSS/BLUE SHIELD

## 2016-06-09 ENCOUNTER — Encounter: Payer: Self-pay | Admitting: Cardiology

## 2016-06-29 ENCOUNTER — Encounter (HOSPITAL_BASED_OUTPATIENT_CLINIC_OR_DEPARTMENT_OTHER): Payer: BLUE CROSS/BLUE SHIELD

## 2016-06-30 ENCOUNTER — Other Ambulatory Visit: Payer: BLUE CROSS/BLUE SHIELD

## 2016-07-19 ENCOUNTER — Other Ambulatory Visit: Payer: Self-pay | Admitting: Student

## 2016-07-30 ENCOUNTER — Other Ambulatory Visit: Payer: Self-pay | Admitting: Adult Health

## 2016-07-31 ENCOUNTER — Telehealth: Payer: Self-pay | Admitting: Family Medicine

## 2016-07-31 DIAGNOSIS — I10 Essential (primary) hypertension: Secondary | ICD-10-CM

## 2016-07-31 NOTE — Telephone Encounter (Signed)
Pt has not followed up or had recent labs by our office.  Will see if cardiology is willing to fill.

## 2016-07-31 NOTE — Telephone Encounter (Signed)
LISINOPRIL 40MG  TAB Will file in chart as: lisinopril (PRINIVIL,ZESTRIL) 40 MG tablet The source prescription was discontinued on 12/16/2015 by Lutricia Horsfallondon, Sierra A, CMA for the following reason: Reorder. TAKE ONE TABLET BY MOUTH ONCE DAILY     Disp: 90 tablet Refills: 0   Class: Normal Start: 07/30/2016  Originally ordered: 4 years ago by Roderick PeeJeffrey A Todd, MD Last refill: 06/27/2016 [facility] [place] [address]: [phone] Medication Refill   Quintella Reicherturner, Traci R, MD  You 2 hours ago (2:31 PM)     OK to refill for 1 month but no futher refills on Lisinopril until he comes in for a BMET

## 2016-07-31 NOTE — Telephone Encounter (Signed)
Nothing attached - what is this in regards to

## 2016-07-31 NOTE — Telephone Encounter (Signed)
Left message to call back  

## 2016-07-31 NOTE — Telephone Encounter (Signed)
OK to refill for 1 month but no futher refills on Lisinopril until he comes in for a BMET

## 2016-07-31 NOTE — Telephone Encounter (Signed)
Dr Todd pt. 

## 2016-08-02 MED ORDER — LISINOPRIL 40 MG PO TABS: 40.0000 mg | ORAL_TABLET | Freq: Every day | ORAL | 0 refills | Status: DC

## 2016-08-02 NOTE — Telephone Encounter (Signed)
Spoke with patient. He will come this Friday for BMET. 30 day supply of Lisinopril called in. He understands more will be called in after labs are resulted. He was grateful for call and agrees with treatment plan.

## 2016-08-04 ENCOUNTER — Other Ambulatory Visit: Payer: BLUE CROSS/BLUE SHIELD

## 2016-09-02 ENCOUNTER — Other Ambulatory Visit: Payer: Self-pay | Admitting: Cardiology

## 2016-09-05 ENCOUNTER — Other Ambulatory Visit: Payer: Self-pay | Admitting: Cardiology

## 2016-09-05 MED ORDER — LISINOPRIL 40 MG PO TABS: 40.0000 mg | ORAL_TABLET | Freq: Every day | ORAL | 6 refills | Status: DC

## 2016-09-14 ENCOUNTER — Encounter: Payer: Self-pay | Admitting: *Deleted

## 2016-09-14 DIAGNOSIS — Z006 Encounter for examination for normal comparison and control in clinical research program: Secondary | ICD-10-CM

## 2016-09-14 NOTE — Progress Notes (Signed)
TWILIGHT Research study month 9 follow up visit completed. Patient denies any bleeding events or other adverse events. He has been 99% compliant with ASA/Placebo and Brilinta. The following pill bottle numbers were dispensed today: [id number]: [id number]; [id number]; [id number]; [id number]; [id number]. The next research required visit is due no later than  29/JAN/2019. At that appointment he will no longer receive ASA/Placebo or brilinta. Further antiplatelet therapy will be  At the discretion of his cardiologist. Questions were encouraged and answered.

## 2016-10-24 ENCOUNTER — Encounter: Payer: Self-pay | Admitting: Family Medicine

## 2016-10-24 ENCOUNTER — Ambulatory Visit (INDEPENDENT_AMBULATORY_CARE_PROVIDER_SITE_OTHER): Payer: BLUE CROSS/BLUE SHIELD | Admitting: Family Medicine

## 2016-10-24 VITALS — HR 59 | Temp 98.5°F | Ht 74.0 in | Wt 380.0 lb

## 2016-10-24 DIAGNOSIS — J018 Other acute sinusitis: Secondary | ICD-10-CM

## 2016-10-24 MED ORDER — AMOXICILLIN-POT CLAVULANATE 875-125 MG PO TABS: 1.0000 | ORAL_TABLET | Freq: Two times a day (BID) | ORAL | 0 refills | Status: DC

## 2016-10-24 NOTE — Progress Notes (Signed)
   Subjective:    Patient ID: Raymond Lutz, male    DOB: [birth year], 51 y.o.   MRN: [id number]  HPI Here for 3 days of sinus pressure, PND, ST, and coughing up yellow sputum.    Review of Systems  Constitutional: Positive for fever.  HENT: Positive for congestion, postnasal drip, sinus pain, sinus pressure and sore throat.   Eyes: Negative.   Respiratory: Positive for cough.        Objective:   Physical Exam  Constitutional: He appears well-developed and well-nourished.  HENT:  Right Ear: External ear normal.  Left Ear: External ear normal.  Nose: Nose normal.  Mouth/Throat: Oropharynx is clear and moist.  Eyes: Conjunctivae are normal.  Neck: No thyromegaly present.  Cardiovascular: Normal rate, regular rhythm, normal heart sounds and intact distal pulses.   Pulmonary/Chest: Effort normal. No respiratory distress. He has no wheezes. He has no rales.  Lymphadenopathy:    He has no cervical adenopathy.          Assessment & Plan:  Sinusitis, treat with Augmentin. Add Mucinex prn. Written out of work today.  Gershon CraneStephen Fry, MD

## 2016-10-24 NOTE — Patient Instructions (Signed)
WE NOW OFFER   Raymond Lutz's FAST TRACK!!!  SAME DAY Appointments for ACUTE CARE  Such as: Sprains, Injuries, cuts, abrasions, rashes, muscle pain, joint pain, back pain Colds, flu, sore throats, headache, allergies, cough, fever  Ear pain, sinus and eye infections Abdominal pain, nausea, vomiting, diarrhea, upset stomach Animal/insect bites  3 Easy Ways to Schedule: Walk-In Scheduling Call in scheduling Mychart Sign-up: [email or web address]         

## 2016-12-01 ENCOUNTER — Encounter: Payer: Self-pay | Admitting: Family Medicine

## 2016-12-20 ENCOUNTER — Other Ambulatory Visit: Payer: Self-pay | Admitting: Adult Health

## 2016-12-20 DIAGNOSIS — E119 Type 2 diabetes mellitus without complications: Secondary | ICD-10-CM

## 2016-12-20 DIAGNOSIS — Z760 Encounter for issue of repeat prescription: Secondary | ICD-10-CM

## 2016-12-20 DIAGNOSIS — I10 Essential (primary) hypertension: Secondary | ICD-10-CM

## 2016-12-27 ENCOUNTER — Encounter: Payer: Self-pay | Admitting: Family Medicine

## 2016-12-27 ENCOUNTER — Ambulatory Visit (INDEPENDENT_AMBULATORY_CARE_PROVIDER_SITE_OTHER): Payer: BLUE CROSS/BLUE SHIELD | Admitting: Family Medicine

## 2016-12-27 VITALS — BP 150/80 | HR 61 | Temp 98.1°F

## 2016-12-27 DIAGNOSIS — N451 Epididymitis: Secondary | ICD-10-CM

## 2016-12-27 DIAGNOSIS — R109 Unspecified abdominal pain: Secondary | ICD-10-CM | POA: Diagnosis not present

## 2016-12-27 DIAGNOSIS — N200 Calculus of kidney: Secondary | ICD-10-CM | POA: Insufficient documentation

## 2016-12-27 LAB — POC URINALSYSI DIPSTICK (AUTOMATED)
Bilirubin, UA: NEGATIVE
Blood, UA: NEGATIVE
Glucose, UA: NEGATIVE
Ketones, UA: NEGATIVE
Leukocytes, UA: NEGATIVE
Nitrite, UA: NEGATIVE
Protein, UA: NEGATIVE
Spec Grav, UA: 1.015 (ref 1.010–1.025)
Urobilinogen, UA: 0.2 E.U./dL
pH, UA: 7 (ref 5.0–8.0)

## 2016-12-27 MED ORDER — HYDROCODONE-ACETAMINOPHEN 10-325 MG PO TABS: 1.0000 | ORAL_TABLET | Freq: Three times a day (TID) | ORAL | 0 refills | Status: DC | PRN

## 2016-12-27 MED ORDER — ONDANSETRON 4 MG PO TBDP: 4.0000 mg | ORAL_TABLET | Freq: Once | ORAL | Status: DC

## 2016-12-27 MED ORDER — DOXYCYCLINE HYCLATE 100 MG PO TABS: 100.0000 mg | ORAL_TABLET | Freq: Two times a day (BID) | ORAL | 0 refills | Status: DC

## 2016-12-27 NOTE — Patient Instructions (Signed)
Rest at home  Drink lots of water  Zofran 4 mg.............. one every 8 hours when necessary for nausea  Doxycycline.............. one twice daily  Vicodin.......... one every 4-6 hours for severe pain  Strain all urines  Return on Monday for follow-up  When your pain subsides it's okay to go back to work

## 2016-12-27 NOTE — Progress Notes (Signed)
Raymond Lutz is a 51 year old male nonsmoker who has a history of hypertension and coronary artery disease obesity you comes in today for evaluation of left flank pain  He states on Sunday he had the sudden onset of left flank pain but a 1 away in a few minutes. During that day the pain would come and go but only lasts for short periods of time. Last night around 10 PM it occurred but is not diminished since that time. He describes the pain on a scale of 1-10 as a 9. He points to the left testicle as a source of his pain.  He's never had a kidney stone   General he is well-developed well-nourished male with acute pain.... A 6 on a scale of 1-10 now  Examination of back is negative examination and genitalia right testicle normal tenderness left epididymis  Urinalysis shows trace blood  #1 left flank pain with radiation left testicle......... question kidney stone  #2 tenderness left epididymis consistent with acute epididymitis  I wonder if he has 2 things going on her just one. It be unusual for the epididymitis to start his left flank pain. He states the tenderness in his testicles really didn't start last night.  With therefore treat him for both conditions and follow-up as outlined..Marland Kitchen

## 2017-01-03 ENCOUNTER — Other Ambulatory Visit: Payer: Self-pay | Admitting: Adult Health

## 2017-01-03 DIAGNOSIS — Z760 Encounter for issue of repeat prescription: Secondary | ICD-10-CM

## 2017-01-03 DIAGNOSIS — I10 Essential (primary) hypertension: Secondary | ICD-10-CM

## 2017-01-09 ENCOUNTER — Ambulatory Visit: Payer: BLUE CROSS/BLUE SHIELD | Admitting: Family Medicine

## 2017-02-08 ENCOUNTER — Other Ambulatory Visit: Payer: Self-pay | Admitting: Student

## 2017-02-08 DIAGNOSIS — E119 Type 2 diabetes mellitus without complications: Secondary | ICD-10-CM

## 2017-03-01 ENCOUNTER — Telehealth: Payer: Self-pay

## 2017-03-01 NOTE — Telephone Encounter (Signed)
Left message to call back and confirm if someone had spoke with him about switching to ASA 81 mg daily and Plavix 75 mg daily.

## 2017-03-01 NOTE — Telephone Encounter (Signed)
-----   Message from Quintella Reichertraci R Turner, MD sent at 02/26/2017  2:33 PM EST ----- Regarding: RE: End of TWILIGHT research Please have him change to ASA 81mg  daily and Plavix 75mg  daily  Armanda Magicraci Turner, MD ----- Message ----- From: Orrin BrighamHedrick, Tammy W, RN Sent: 02/26/2017  10:01 AM To: Quintella Reichertraci R Turner, MD, Elaina PatteeKimberly D Lutterloh, RN Subject: End of TWILIGHT research                       Dr. Mayford Knifeurner,     Raymond Lutz is at the end of [facility].   Further antiplatelet therapy (Including ASA) will be at your discretion. A script will need to be sent to his pharmacy (if applicable)  Please let me know your plans and I will educate patient.  Thanks McKessonammy

## 2017-03-21 ENCOUNTER — Encounter: Payer: BLUE CROSS/BLUE SHIELD | Admitting: *Deleted

## 2017-03-21 DIAGNOSIS — Z006 Encounter for examination for normal comparison and control in clinical research program: Secondary | ICD-10-CM

## 2017-03-21 NOTE — Progress Notes (Unsigned)
TWILIGHT Research study month 15 visit completed. Patient states he had a large bruise right upper arm on 01/25/17. The Bruise was the size of a small orange and lasted until 02/01/17. He has been 100 % compliant with ASA/Placebo and 98 % compliant with Brilinta. Dr, Mayford Knifeurner has prescribed Plavix 75 mg daily and ASA 81 mg daily. Patient verbalized understanding. Next study required telephone follow up will be between 6/Mar/2019-13/Apr/2019. Questions were encouraged and answered.

## 2017-03-22 ENCOUNTER — Other Ambulatory Visit: Payer: Self-pay | Admitting: *Deleted

## 2017-03-22 MED ORDER — ASPIRIN EC 81 MG PO TBEC: 81.0000 mg | DELAYED_RELEASE_TABLET | Freq: Every day | ORAL | 3 refills | Status: AC

## 2017-03-22 MED ORDER — CLOPIDOGREL BISULFATE 75 MG PO TABS: 75.0000 mg | ORAL_TABLET | Freq: Every day | ORAL | 3 refills | Status: DC

## 2017-04-02 ENCOUNTER — Ambulatory Visit: Payer: BLUE CROSS/BLUE SHIELD | Admitting: Family Medicine

## 2017-04-02 ENCOUNTER — Encounter: Payer: Self-pay | Admitting: Family Medicine

## 2017-04-02 VITALS — BP 150/88 | HR 84 | Temp 98.4°F | Wt 380.0 lb

## 2017-04-02 DIAGNOSIS — J069 Acute upper respiratory infection, unspecified: Secondary | ICD-10-CM | POA: Diagnosis not present

## 2017-04-02 DIAGNOSIS — B9789 Other viral agents as the cause of diseases classified elsewhere: Secondary | ICD-10-CM | POA: Diagnosis not present

## 2017-04-02 MED ORDER — HYDROCODONE-HOMATROPINE 5-1.5 MG/5ML PO SYRP: 5.0000 mL | ORAL_SOLUTION | Freq: Three times a day (TID) | ORAL | 0 refills | Status: DC | PRN

## 2017-04-02 NOTE — Progress Notes (Signed)
Raymond Lutz is a 52 year old married male nonsmoker who comes in today with a four-day history of head congestion sore throat and cough  Review of systems otherwise  Vital signs stable he is afebrile HEENT were negative neck was supple thyroid is nonenlarged no adenopathy lungs are clear  #1 viral syndrome............ treat symptomatically as outlined

## 2017-04-02 NOTE — Patient Instructions (Signed)
Drink lots of liquids  Tylenol...........Marland Kitchen. or aspirin......... 2 tabs 3 times a day when necessary for fever chills and a can all over  Hydromet........Marland Kitchen. 1/2-1 teaspoon at bedtime when necessary for cough

## 2017-04-04 ENCOUNTER — Encounter: Payer: Self-pay | Admitting: Family Medicine

## 2017-04-04 ENCOUNTER — Telehealth: Payer: Self-pay | Admitting: Family Medicine

## 2017-04-04 NOTE — Telephone Encounter (Signed)
Patient stated he also needs a note for work.  He has been out of work since Monday for a viral URI.  He wants to return to work on Friday.

## 2017-04-04 NOTE — Telephone Encounter (Signed)
Copied from CRM [id number]#[id number]. Topic: Inquiry >> Apr 04, 2017 12:38 PM Alexander BergeronBarksdale, Harvey B wrote: Reason for CRM: pt called and states he has been following instructions to get better since visit on Monday but pt is not feeling better and is now coughing up brown flem, contact to advise

## 2017-04-05 NOTE — Telephone Encounter (Signed)
Called pt left a message for pt to return my call in the office regarding to his last Monday visit.

## 2017-04-05 NOTE — Telephone Encounter (Signed)
Per PEC: Pt called in and said that he needs a work note for from Tuesday of this week to Friday of this week.

## 2017-04-06 ENCOUNTER — Encounter: Payer: Self-pay | Admitting: Family Medicine

## 2017-04-06 ENCOUNTER — Encounter: Payer: Self-pay | Admitting: *Deleted

## 2017-04-06 ENCOUNTER — Ambulatory Visit: Payer: BLUE CROSS/BLUE SHIELD | Admitting: Family Medicine

## 2017-04-06 ENCOUNTER — Ambulatory Visit: Payer: Self-pay | Admitting: *Deleted

## 2017-04-06 VITALS — BP 132/80 | HR 72 | Temp 98.7°F | Resp 12 | Ht 74.0 in | Wt 382.4 lb

## 2017-04-06 DIAGNOSIS — J029 Acute pharyngitis, unspecified: Secondary | ICD-10-CM | POA: Diagnosis not present

## 2017-04-06 DIAGNOSIS — J4520 Mild intermittent asthma, uncomplicated: Secondary | ICD-10-CM | POA: Diagnosis not present

## 2017-04-06 DIAGNOSIS — J069 Acute upper respiratory infection, unspecified: Secondary | ICD-10-CM | POA: Diagnosis not present

## 2017-04-06 LAB — POCT INFLUENZA A/B
Influenza A, POC: NEGATIVE
Influenza B, POC: NEGATIVE

## 2017-04-06 LAB — POCT RAPID STREP A (OFFICE): Rapid Strep A Screen: NEGATIVE

## 2017-04-06 MED ORDER — ALBUTEROL SULFATE HFA 108 (90 BASE) MCG/ACT IN AERS
2.0000 | INHALATION_SPRAY | Freq: Four times a day (QID) | RESPIRATORY_TRACT | 1 refills | Status: DC | PRN
Start: 2017-04-06 — End: 2018-07-10

## 2017-04-06 MED ORDER — PREDNISONE 20 MG PO TABS: 40.0000 mg | ORAL_TABLET | Freq: Every day | ORAL | 0 refills | Status: AC

## 2017-04-06 MED ORDER — FLUTICASONE PROPIONATE 50 MCG/ACT NA SUSP: 1.0000 | Freq: Two times a day (BID) | NASAL | 0 refills | Status: DC

## 2017-04-06 MED ORDER — BENZONATATE 100 MG PO CAPS: 200.0000 mg | ORAL_CAPSULE | Freq: Two times a day (BID) | ORAL | 0 refills | Status: AC | PRN

## 2017-04-06 NOTE — Telephone Encounter (Signed)
Pt dx with a cold, coughing up some flecks of blood this morning. He is on a blood thinner.  Has hx of asthma and a heart attack. Saw Dr. Tawanna Coolerodd on 04/02/17. Appointment made for today.  Reason for Disposition . [1] Nasal discharge AND [2] present > 10 days  Answer Assessment - Initial Assessment Questions 1. ONSET: "When did you start coughing up blood?"     This morning 2. SEVERITY: "How many times?" "How much blood?" (e.g., flecks, streaks, tablespoons, etc)     3 times with flecks of blood 3. COUGHING SPASMS: "Did the blood appear after a coughing spell?"       no 4. RESPIRATORY DISTRESS: "Describe your breathing."      Stuffy nose, breathing ok 5. FEVER: "Do you have a fever?" If so, ask: "What is your temperature, how was it measured, and when did it start?"     Fever usually in the morning 6. SPUTUM: "Describe the color of your sputum" (clear, white, yellow, green), "Has there been any change recently?"     Whitish yellowish, yesterday was darker 7. CARDIAC HISTORY: "Do you have any history of heart disease?" (e.g., heart attack, congestive heart failure)      Heart attack 2 years ago 8. LUNG HISTORY: "Do you have any history of lung disease?"  (e.g., pulmonary embolus, asthma, emphysema)     asthma 9. PE RISK FACTORS: "Do you have a history of blood clots?" (or: recent major surgery, recent prolonged travel, bedridden )     no 10. OTHER SYMPTOMS: "Do you have any other symptoms?" (e.g., nosebleed, chest pain, abdominal pain, vomiting)       no 11. PREGNANCY: "Is there any chance you are pregnant?" "When was your last menstrual period?"       n/a 12. TRAVEL: "Have you traveled out of the country in the last month?" (e.g., travel history, exposures)       no  Protocols used: COUGHING UP BLOOD-A-AH

## 2017-04-06 NOTE — Progress Notes (Addendum)
ACUTE VISIT  HPI:  Chief Complaint  Patient presents with  . Cough    was seen by Tawanna Cooler on Monday, streaks of blood in phlem this morning  . Sore Throat    [name] is a 52 y.o.male here today complaining of 7 days of respiratory symptoms.  Productive cough with clear mucus mixed with blood string (today), he brought a bottle with sputum.  2 days of "light" fever, "100 or so." Sore throat, exacerbated by swallowing and coughing, it was worse this morning.  He was seen for same problem on 04/02/17, she has not filled prescription given for Hycodan.  URI   This is a new problem. The current episode started in the past 7 days. The problem has been unchanged. Associated symptoms include congestion, coughing, rhinorrhea, sneezing, a sore throat and wheezing. Pertinent negatives include no abdominal pain, diarrhea, ear pain, headaches, nausea, neck pain, plugged ear sensation, rash, sinus pain, swollen glands or vomiting. He has tried acetaminophen for the symptoms. The treatment provided mild relief.   No Hx of recent travel. No sick contact. No known insect bite.  Hx of allergies: History of asthma, he has not used his Albuterol inhaler, he needs a new prescription.  He has had mild wheezing, denies dyspnea.  OTC medications for this problem: Tylenol today 8:00 Am.  Also taking Robitussin.  Symptoms otherwise stable.  History of DM 2, BS's "good."  Lab Results  Component Value Date   HGBA1C 6.6 12/10/2015    Review of Systems  Constitutional: Positive for activity change, appetite change, fatigue and fever. Negative for chills.  HENT: Positive for congestion, rhinorrhea, sneezing and sore throat. Negative for ear pain, mouth sores, postnasal drip, sinus pain, trouble swallowing and voice change.   Eyes: Negative for discharge, redness and itching.  Respiratory: Positive for cough and wheezing. Negative for chest tightness and shortness of breath.     Cardiovascular: Negative for leg swelling.  Gastrointestinal: Negative for abdominal pain, diarrhea, nausea and vomiting.  Musculoskeletal: Negative for gait problem, myalgias and neck pain.  Skin: Negative for rash.  Allergic/Immunologic: Positive for environmental allergies.  Neurological: Negative for syncope, weakness and headaches.  Hematological: Negative for adenopathy. Does not bruise/bleed easily.  Psychiatric/Behavioral: Positive for sleep disturbance. Negative for confusion.      Current Outpatient Medications on File Prior to Visit  Medication Sig Dispense Refill  . amLODipine (NORVASC) 5 MG tablet Take 1 tablet (5 mg total) by mouth daily. 30 tablet 11  . aspirin EC 81 MG tablet Take 1 tablet (81 mg total) by mouth daily. 90 tablet 3  . atorvastatin (LIPITOR) 80 MG tablet Take 1 tablet (80 mg total) by mouth at bedtime. Please make yearly appt with Dr. Mayford Knife before anymore refills. Thank you 90 tablet 0  . BOOSTRIX 5-2.5-18.5 injection     . clopidogrel (PLAVIX) 75 MG tablet Take 1 tablet (75 mg total) by mouth daily. 90 tablet 3  . ezetimibe (ZETIA) 10 MG tablet Take 1 tablet (10 mg total) by mouth daily. 30 tablet 11  . furosemide (LASIX) 40 MG tablet TAKE ONE TABLET BY MOUTH TWICE DAILY 60 tablet 5  . glipiZIDE (GLUCOTROL) 5 MG tablet TAKE ONE TABLET BY MOUTH TWICE DAILY BEFORE A MEAL 60 tablet 5  . glucose blood test strip 1 each by Other route. Use as instructed     . HYDROcodone-acetaminophen (NORCO) 10-325 MG tablet Take 1 tablet by mouth every 8 (eight) hours  as needed. 30 tablet 0  . HYDROcodone-homatropine (HYCODAN) 5-1.5 MG/5ML syrup Take 5 mLs by mouth every 8 (eight) hours as needed. 240 mL 0  . isosorbide mononitrate (IMDUR) 30 MG 24 hr tablet Take 1 tablet (30 mg total) by mouth daily. 30 tablet 11  . KLOR-CON M20 20 MEQ tablet TAKE TWO TABLETS BY MOUTH IN THE MORNING 60 tablet 5  . lisinopril (PRINIVIL,ZESTRIL) 40 MG tablet Take 1 tablet (40 mg total) by  mouth daily. 30 tablet 6  . metFORMIN (GLUCOPHAGE-XR) 750 MG 24 hr tablet Take 1 tablet (750 mg total) by mouth daily with breakfast. 30 tablet 11  . metoprolol (LOPRESSOR) 50 MG tablet TAKE ONE TABLET BY MOUTH TWICE DAILY 180 tablet 2  . nitroGLYCERIN (NITROSTAT) 0.4 MG SL tablet Place 1 tablet (0.4 mg total) under the tongue every 5 (five) minutes x 3 doses as needed for chest pain. 25 tablet 3   Current Facility-Administered Medications on File Prior to Visit  Medication Dose Route Frequency Provider Last Rate Last Dose  . ondansetron (ZOFRAN-ODT) disintegrating tablet 4 mg  4 mg Oral Once Roderick Peeodd, Jeffrey A, MD         Past Medical History:  Diagnosis Date  . Arthritis   . Asthma   . Coronary artery disease    a. 2013: cath showing nonobstructive CAD with 50% OM, 20-30% dLAD, 30-40%dRCA  b. 12/2015: NSTEMI w/ 99% stenosis dLAD (DES placed), 99% stenosis 2nd Mrg (DES placed), apical LAD stenosis too small for PCI and mild disease in the RCA.   Marland Kitchen. DIABETES MELLITUS, TYPE II 10/17/2006  . ED (erectile dysfunction)   . GERD (gastroesophageal reflux disease)   . HYPERLIPIDEMIA 10/17/2006  . HYPERTENSION 10/17/2006  . OBESITY 10/17/2006  . Paroxysmal atrial fibrillation (HCC)    2006 - was on coumadin - took himself off 6 mos after initiation.  . Shortness of breath   . Sleep apnea    not using CPAP regularly   No Known Allergies  Social History   Socioeconomic History  . Marital status: Married    Spouse name: None  . Number of children: None  . Years of education: None  . Highest education level: None  Social Needs  . Financial resource strain: None  . Food insecurity - worry: None  . Food insecurity - inability: None  . Transportation needs - medical: None  . Transportation needs - non-medical: None  Occupational History  . None  Tobacco Use  . Smoking status: Never Smoker  . Smokeless tobacco: Never Used  Substance and Sexual Activity  . Alcohol use: No  . Drug use: No  .  Sexual activity: Yes  Other Topics Concern  . 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  [place] - 2 children - 13, 10.    Vitals:   04/06/17 1507  BP: 132/80  Pulse: 72  Resp: 12  Temp: 98.7 F (37.1 C)  SpO2: 98%   Body mass index is 49.09 kg/m.   Physical Exam  Nursing note and vitals reviewed. Constitutional: He is oriented to person, place, and time. He appears well-developed. He does not appear ill. No distress.  HENT:  Head: Normocephalic and atraumatic.  Right Ear: Tympanic membrane, external ear and ear canal normal.  Left Ear: Tympanic membrane, external ear and ear canal normal.  Nose: Rhinorrhea present. Right sinus exhibits no maxillary sinus tenderness and no frontal sinus tenderness. Left sinus exhibits  no maxillary sinus tenderness and no frontal sinus tenderness.  Mouth/Throat: Uvula is midline and mucous membranes are normal. Posterior oropharyngeal erythema present. No oropharyngeal exudate or posterior oropharyngeal edema.  Hypertrophic turbinates. Nasal voice. Postnasal drainage.  Eyes: Conjunctivae are normal.  Cardiovascular: Normal rate and regular rhythm.  No murmur heard. Respiratory: Effort normal and breath sounds normal. No stridor. No respiratory distress.  Lymphadenopathy:       Head (right side): No submandibular adenopathy present.       Head (left side): No submandibular adenopathy present.    He has no cervical adenopathy.  Left submandibular gland tender but no enlarged.  Neurological: He is alert and oriented to person, place, and time. He has normal strength. Gait normal.  Skin: Skin is warm. No rash noted. No erythema.  Psychiatric: He has a normal mood and affect. His speech is normal.  Well groomed, good eye contact.     ASSESSMENT AND PLAN:   Mr.Octavion was seen today for cough and sore throat.  Diagnoses and all orders for this visit:  URI, acute  Symptoms suggests a viral  etiology,so I do not think abx is needed at this time. Instructed to monitor for signs of complications, including new onset of fever among some, clearly instructed about warning signs. I also explained that cough and nasal congestion can last a few days and sometimes weeks. F/U as needed.  -     POCT Influenza A/B -     POCT rapid strep A -     benzonatate (TESSALON) 100 MG capsule; Take 2 capsules (200 mg total) by mouth 2 (two) times daily as needed for up to 10 days. -     fluticasone (FLONASE) 50 MCG/ACT nasal spray; Place 1 spray into both nostrils 2 (two) times daily.  Sore throat  Viral etiology most likely. Blood in sputum today most like caused by throat irritation. Symptomatic treatment. Rapid strep negative. We will follow throat culture and give recommendations accordingly.  -     POCT Influenza A/B -     POCT rapid strep A  Mild intermittent asthma, unspecified whether complicated  No wheezing appreciated today. After discussion of some side effects he agrees with short course of Prednisone. Albuterol inh 2 puff every 6 hours for a week then as needed for wheezing or shortness of breath.  Instructed about warning signs.  -     albuterol (VENTOLIN HFA) 108 (90 Base) MCG/ACT inhaler; Inhale 2 puffs into the lungs every 6 (six) hours as needed. -     predniSONE (DELTASONE) 20 MG tablet; Take 2 tablets (40 mg total) by mouth daily with breakfast for 3 days.      -Mr. Larena Glassman was advised to seek attention immediately if symptoms worsen or to follow if they persist or new concerns arise.       Asencion Guisinger G. [place], MD  [facility]. [place] office.

## 2017-04-06 NOTE — Patient Instructions (Addendum)
  Raymond Lutz I have seen you today for an acute visit.  A few things to remember from today's visit:   URI, acute - Plan: POCT Influenza A/B, POCT rapid strep A, benzonatate (TESSALON) 100 MG capsule, fluticasone (FLONASE) 50 MCG/ACT nasal spray  Sore throat - Plan: POCT Influenza A/B, POCT rapid strep A  Mild intermittent asthma, unspecified whether complicated - Plan: albuterol (VENTOLIN HFA) 108 (90 Base) MCG/ACT inhaler, predniSONE (DELTASONE) 20 MG tablet  viral infections are self-limited and we treat each symptom depending of severity.  Over the counter medications as decongestants and cold medications usually help, they need to be taken with caution if there is a history of high blood pressure or palpitations. Tylenol and/or Ibuprofen also helps with most symptoms (headache, muscle aching, fever,etc) Plenty of fluids. Honey helps with cough. Steam inhalations helps with runny nose, nasal congestion, and may prevent sinus infections. Cough and nasal congestion could last a few days and sometimes weeks.  Prednisone can elevate blood sugars.   In general please monitor for signs of worsening symptoms and seek immediate medical attention if any concerning.  If symptoms are not resolved in 10-14 days you should schedule a follow up appointment with your doctor, before if needed.  I hope you get better soon!

## 2017-04-07 ENCOUNTER — Encounter: Payer: Self-pay | Admitting: Family Medicine

## 2017-04-08 ENCOUNTER — Encounter: Payer: Self-pay | Admitting: Family Medicine

## 2017-04-08 LAB — CULTURE, GROUP A STREP
MICRO NUMBER:: 90108901
SPECIMEN QUALITY:: ADEQUATE

## 2017-04-09 NOTE — Telephone Encounter (Signed)
Attempted to call pt X2 , left a message to return call in the office

## 2017-04-11 NOTE — Telephone Encounter (Signed)
Pt was seen by Dr SwazilandJordan on 04/06/2017

## 2017-04-12 NOTE — Telephone Encounter (Signed)
Unable to reach patient after several attempts.

## 2017-04-25 ENCOUNTER — Other Ambulatory Visit: Payer: Self-pay | Admitting: Student

## 2017-04-25 ENCOUNTER — Other Ambulatory Visit: Payer: Self-pay | Admitting: Cardiology

## 2017-05-16 ENCOUNTER — Other Ambulatory Visit: Payer: Self-pay | Admitting: Cardiology

## 2017-05-24 ENCOUNTER — Other Ambulatory Visit: Payer: Self-pay | Admitting: Student

## 2017-05-24 ENCOUNTER — Other Ambulatory Visit: Payer: Self-pay | Admitting: Cardiology

## 2017-05-25 MED ORDER — LISINOPRIL 40 MG PO TABS: 40.0000 mg | ORAL_TABLET | Freq: Every day | ORAL | 0 refills | Status: DC

## 2017-05-25 MED ORDER — METOPROLOL TARTRATE 50 MG PO TABS: 50.0000 mg | ORAL_TABLET | Freq: Two times a day (BID) | ORAL | 0 refills | Status: DC

## 2017-06-03 ENCOUNTER — Other Ambulatory Visit: Payer: Self-pay | Admitting: Cardiology

## 2017-06-03 DIAGNOSIS — E119 Type 2 diabetes mellitus without complications: Secondary | ICD-10-CM

## 2017-06-04 MED ORDER — ATORVASTATIN CALCIUM 80 MG PO TABS: 80.0000 mg | ORAL_TABLET | Freq: Every day | ORAL | 0 refills | Status: DC

## 2017-06-12 ENCOUNTER — Telehealth: Payer: Self-pay | Admitting: *Deleted

## 2017-06-12 NOTE — Telephone Encounter (Signed)
TWILIGHT Research study month 18 telephone follow up attempted. Left message for patient to call research office.

## 2017-06-15 ENCOUNTER — Encounter: Payer: Self-pay | Admitting: *Deleted

## 2017-06-15 ENCOUNTER — Other Ambulatory Visit: Payer: Self-pay | Admitting: Cardiology

## 2017-06-15 DIAGNOSIS — Z006 Encounter for examination for normal comparison and control in clinical research program: Secondary | ICD-10-CM

## 2017-06-15 NOTE — Progress Notes (Signed)
TWILIGHT Research study month 18 telephone follow up completed. Patient continues on plavix and ASA without any interruptions or bleeding events. I thanked him for his participation.

## 2017-06-25 ENCOUNTER — Other Ambulatory Visit: Payer: Self-pay | Admitting: Cardiology

## 2017-06-25 ENCOUNTER — Telehealth: Payer: Self-pay | Admitting: Cardiology

## 2017-06-25 NOTE — Telephone Encounter (Signed)
New Message ° ° ° ° ° ° ° ° ° ° °*STAT* If patient is at the pharmacy, call can be transferred to refill team. ° ° °1. Which medications need to be refilled? (please list name of each medication and dose if known)  ° °2. Which pharmacy/location (including street and city if local pharmacy) is medication to be sent to? ° °3. Do they need a 30 day or 90 day supply?  ° °

## 2017-06-26 MED ORDER — LISINOPRIL 40 MG PO TABS: 40.0000 mg | ORAL_TABLET | Freq: Every day | ORAL | 0 refills | Status: DC

## 2017-06-26 MED ORDER — AMLODIPINE BESYLATE 5 MG PO TABS: 5.0000 mg | ORAL_TABLET | Freq: Every day | ORAL | 0 refills | Status: DC

## 2017-06-26 MED ORDER — METOPROLOL TARTRATE 50 MG PO TABS: 50.0000 mg | ORAL_TABLET | Freq: Two times a day (BID) | ORAL | 0 refills | Status: DC

## 2017-06-26 MED ORDER — ISOSORBIDE MONONITRATE ER 30 MG PO TB24: 30.0000 mg | ORAL_TABLET | Freq: Every day | ORAL | 0 refills | Status: DC

## 2017-06-28 ENCOUNTER — Other Ambulatory Visit: Payer: Self-pay | Admitting: Family Medicine

## 2017-06-28 DIAGNOSIS — I10 Essential (primary) hypertension: Secondary | ICD-10-CM

## 2017-06-28 DIAGNOSIS — Z760 Encounter for issue of repeat prescription: Secondary | ICD-10-CM

## 2017-06-29 ENCOUNTER — Other Ambulatory Visit: Payer: Self-pay | Admitting: Family Medicine

## 2017-06-29 DIAGNOSIS — Z760 Encounter for issue of repeat prescription: Secondary | ICD-10-CM

## 2017-06-29 DIAGNOSIS — E119 Type 2 diabetes mellitus without complications: Secondary | ICD-10-CM

## 2017-06-30 NOTE — Telephone Encounter (Signed)
Patient needs to schedule an office visit for complete physical examination with one of our new providers

## 2017-07-14 ENCOUNTER — Other Ambulatory Visit: Payer: Self-pay | Admitting: Family Medicine

## 2017-07-14 DIAGNOSIS — I10 Essential (primary) hypertension: Secondary | ICD-10-CM

## 2017-07-14 DIAGNOSIS — Z760 Encounter for issue of repeat prescription: Secondary | ICD-10-CM

## 2017-07-17 ENCOUNTER — Other Ambulatory Visit: Payer: Self-pay | Admitting: Cardiology

## 2017-07-18 ENCOUNTER — Other Ambulatory Visit: Payer: Self-pay | Admitting: Cardiology

## 2017-07-26 ENCOUNTER — Other Ambulatory Visit: Payer: Self-pay | Admitting: Cardiology

## 2017-08-01 ENCOUNTER — Other Ambulatory Visit: Payer: Self-pay | Admitting: Cardiology

## 2017-08-25 ENCOUNTER — Other Ambulatory Visit: Payer: Self-pay | Admitting: Cardiology

## 2017-08-28 ENCOUNTER — Encounter: Payer: Self-pay | Admitting: Cardiology

## 2017-08-28 ENCOUNTER — Ambulatory Visit (INDEPENDENT_AMBULATORY_CARE_PROVIDER_SITE_OTHER): Payer: BLUE CROSS/BLUE SHIELD | Admitting: Cardiology

## 2017-08-28 VITALS — BP 144/76 | HR 64 | Ht 74.0 in | Wt 392.4 lb

## 2017-08-28 DIAGNOSIS — E7800 Pure hypercholesterolemia, unspecified: Secondary | ICD-10-CM

## 2017-08-28 DIAGNOSIS — I10 Essential (primary) hypertension: Secondary | ICD-10-CM | POA: Diagnosis not present

## 2017-08-28 DIAGNOSIS — I2510 Atherosclerotic heart disease of native coronary artery without angina pectoris: Secondary | ICD-10-CM | POA: Diagnosis not present

## 2017-08-28 NOTE — Progress Notes (Signed)
Cardiology Office Note:    Date:  08/28/2017   ID:  Raymond Lutz, DOB 08/04/1965, MRN [id number]  PCP:  Roderick Pee, MD  Cardiologist:  No primary care provider on file.    Referring MD: Roderick Pee, MD   Chief Complaint  Patient presents with  . Coronary Artery Disease  . Hypertension  . Hyperlipidemia    History of Present Illness:    Raymond Lutz is a 52 y.o. male with a hx of type II DM, dyslipidemia, HTN, PVD, PAF, nonobstructive ASCAD with 50% OM, 20-30% dLAD, 30-40%dRCA at cath 2013.  His last cath in the setting of NSTEMI was 12/17/2015 which showed 30% dRCA, 99% OM2, 99% distal LAD and underwent DES to the distal LAD and OM2 and he is on DPAT with ASA and Brilinta. There was an apical LAD stenosis which was too small for PCI along with mild nonobstructive disease in the RCA.  When I saw him a year ago he was complaining of some anginal chest pain.  Imdur was added and he followed up with Nada Boozer, NP shortly after that and his angina had resolved.  He is here today for followup and is doing well.  He denies any chest pain or pressure, SOB, DOE, PND, orthopnea, LE edema, dizziness, palpitations or syncope. He is compliant with his meds and is tolerating meds with no SE.        Past Medical History:  Diagnosis Date  . Arthritis   . Asthma   . 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  . Chest pain 03/18/2014  . Coronary artery disease    a. 2013: cath showing nonobstructive CAD with 50% OM, 20-30% dLAD, 30-40%dRCA  b. 12/2015: NSTEMI w/ 99% stenosis dLAD (DES placed), 99% stenosis 2nd Mrg (DES placed), apical LAD stenosis too small for PCI and mild disease in the RCA.   Marland Kitchen DIABETES MELLITUS, TYPE II 10/17/2006  . ED (erectile dysfunction)   . GERD (gastroesophageal reflux disease)   . HYPERLIPIDEMIA 10/17/2006  . HYPERTENSION 10/17/2006  . NSTEMI (non-ST elevated myocardial infarction) (HCC) 12/16/2015  . OBESITY 10/17/2006  . Paroxysmal atrial fibrillation (HCC)    2006 - was on  coumadin - took himself off 6 mos after initiation.  . Shortness of breath   . Sleep apnea    not using CPAP regularly    Past Surgical History:  Procedure Laterality Date  . arthroscopic knee    . CARDIAC CATHETERIZATION    . CARDIAC CATHETERIZATION N/A 12/17/2015   Procedure: Left Heart Cath and Coronary Angiography;  Surgeon: Kathleene Hazel, MD;  Location: [facility] INVASIVE CV LAB;  Service: Cardiovascular;  Laterality: N/A;  . CARDIAC CATHETERIZATION N/A 12/17/2015   Procedure: Coronary Stent Intervention;  Surgeon: Kathleene Hazel, MD;  Location: [facility] INVASIVE CV LAB;  Service: Cardiovascular;  Laterality: N/A;  . HERNIA REPAIR    . HERNIA REPAIR    . LEFT HEART CATHETERIZATION WITH CORONARY ANGIOGRAM N/A 05/03/2011   Procedure: LEFT HEART CATHETERIZATION WITH CORONARY ANGIOGRAM;  Surgeon: Iran Ouch, MD;  Location: [facility] CATH LAB;  Service: Cardiovascular;  Laterality: N/A;  . skin grafts      Current Medications:    Allergies:   Patient has no known allergies.   Social History   Socioeconomic History  . Marital status: Married    Spouse name: Not on file  . Number of children: Not on file  . Years of education: Not on file  . Highest education level: Not  on file  Occupational History  . Not on file  Social Needs  . Financial resource strain: Not on file  . Food insecurity:    Worry: Not on file    Inability: Not on file  . Transportation needs:    Medical: Not on file    Non-medical: Not on file  Tobacco Use  . Smoking status: Never Smoker  . Smokeless tobacco: Never Used  Substance and Sexual Activity  . Alcohol use: No  . Drug use: No  . Sexual activity: Yes  Lifestyle  . Physical activity:    Days per week: Not on file    Minutes per session: Not on file  . Stress: Not on file  Relationships  . Social connections:    Talks on phone: Not on file    Gets together: Not on file    Attends religious service: Not on file    Active member of club  or organization: Not on file    Attends meetings of clubs or organizations: Not on file    Relationship status: Not on file  Other Topics Concern  . 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  [place] - 2 children - 13, 10.     Family History: The patient's family history includes Cerebral aneurysm in his mother; Diabetes in his maternal grandmother; Heart attack in his brother and sister; Hypertension in his brother; Lung cancer in his father.  ROS:   Please see the history of present illness.    ROS  All other systems reviewed and negative.   EKGs/Labs/Other Studies Reviewed:    The following studies were reviewed today: none  EKG:  EKG is  ordered today.  The ekg ordered today demonstrates  nonobstructive ASCAD with 50% OM, 20-30% dLAD, 30-40%dRCA at cath 2013.  His last cath in the setting of NSTEMI was 12/17/2015 which showed 30% dRCA, 99% OM2, 99% distal LAD and underwent DES to the distal LAD and OM2 and he is on DPAT with ASA and Brilinta. There was an apical LAD stenosis which was too small for PCI along with mild nonobstructive disease in the RCA.  Recent Labs: No results found for requested labs within last 8760 hours.   Recent Lipid Panel    Component Value Date/Time   CHOL 169 05/08/2016 1416   CHOL 177 02/08/2014 0422   TRIG 98 05/08/2016 1416   TRIG 147 02/08/2014 0422   HDL 37 (L) 05/08/2016 1416   HDL 30 (L) 02/08/2014 0422   CHOLHDL 4.6 05/08/2016 1416   CHOLHDL 4.8 02/01/2016 1126   VLDL 17 02/01/2016 1126   VLDL 29 02/08/2014 0422   LDLCALC 112 (H) 05/08/2016 1416   LDLCALC 118 (H) 02/08/2014 0422   LDLDIRECT 171.0 06/24/2012 1146    Physical Exam:    VS:  BP (!) 144/76   Pulse 64   Ht 6\' 2"  (1.88 m)   Wt (!) 392 lb 6.4 oz (178 kg)   SpO2 95%   BMI 50.38 kg/m     Wt Readings from Last 3 Encounters:  08/28/17 (!) 392 lb 6.4 oz (178 kg)  04/06/17 (!) 382 lb 6 oz (173.4 kg)  04/02/17 (!) 380 lb (172.4  kg)     GEN:  Well nourished, well developed in no acute distress HEENT: Normal NECK: No JVD; No carotid bruits LYMPHATICS: No lymphadenopathy CARDIAC: RRR, no murmurs, rubs, gallops RESPIRATORY:  Clear to auscultation without  rales, wheezing or rhonchi  ABDOMEN: Soft, non-tender, non-distended MUSCULOSKELETAL:  No edema; No deformity  SKIN: Warm and dry NEUROLOGIC:  Alert and oriented x 3 PSYCHIATRIC:  Normal affect   ASSESSMENT:    1. Coronary artery disease involving native coronary artery of native heart without angina pectoris   2. Essential hypertension   3. Pure hypercholesterolemia    PLAN:    In order of problems listed above:  1.  ASCAD -  Cath with nonobstructive ASCAD with 50% OM, 20-30% dLAD, 30-40%dRCA at cath 2013.  His last cath in the setting of NSTEMI was 12/17/2015 which showed 30% dRCA, 99% OM2, 99% distal LAD and underwent DES to the distal LAD and OM2 and he is on DPAT with ASA and Brilinta. There was an apical LAD stenosis which was too small for PCI along with mild nonobstructive disease in the RCA.  He is doing well today and has not had any angina since starting Imdur.  He will continue on aspirin 81 mg daily, Plavix 75 mg daily, statin, Imdur 30 mg daily and beta-blocker.  Encouraged him to get into an exercise program in which he walks briskly at least 40 minutes a day and really watch his diet with lower portions and less carbs and sugars.  2.  HTN -blood pressures well controlled on exam today.  He will continue on amlodipine 5 mg daily, Lopressor 50 mg twice daily and lisinopril 40 mg daily.  I will check a bmet.  3.  Hyperlipidemia -LDL goal is less than 70.  He will continue on atorvastatin 80 mg daily.  I will check an FLP and ALT.   Medication Adjustments/Labs and Tests Ordered: Current medicines are reviewed at length with the patient today.  Concerns regarding medicines are outlined above.  No orders of the defined types were placed in this  encounter.  No orders of the defined types were placed in this encounter.   Signed, Armanda Magic, MD  08/28/2017 1:31 PM    [facility]

## 2017-08-28 NOTE — Patient Instructions (Addendum)
Medication Instructions:  Your physician recommends that you continue on your current medications as directed. Please refer to the Current Medication list given to you today.  If you need a refill on your cardiac medications, please contact your pharmacy first.  Labwork: Today for kidney function test, liver function test and fasting lipids   Testing/Procedures: None ordered   Follow-Up: Your physician wants you to follow-up in: 1 year with Dr. Mayford Knifeurner. You will receive a reminder letter in the mail two months in advance. If you don't receive a letter, please call our office to schedule the follow-up appointment.  Any Other Special Instructions Will Be Listed Below (If Applicable).   Thank you for choosing [facility]    Lyda PeroneRena Giordano Getman, RN  [phone]  If you need a refill on your cardiac medications before your next appointment, please call your pharmacy.

## 2017-08-29 LAB — BASIC METABOLIC PANEL
BUN/Creatinine Ratio: 16 (ref 9–20)
BUN: 13 mg/dL (ref 6–24)
CO2: 23 mmol/L (ref 20–29)
Calcium: 9.3 mg/dL (ref 8.7–10.2)
Chloride: 105 mmol/L (ref 96–106)
Creatinine, Ser: 0.83 mg/dL (ref 0.76–1.27)
GFR calc Af Amer: 119 mL/min/{1.73_m2} (ref >59)
GFR calc non Af Amer: 103 mL/min/{1.73_m2} (ref >59)
Glucose: 97 mg/dL (ref 65–99)
Potassium: 4.4 mmol/L (ref 3.5–5.2)
Sodium: 143 mmol/L (ref 134–144)

## 2017-08-29 LAB — HEPATIC FUNCTION PANEL
ALT: 36 IU/L (ref 0–44)
AST: 25 IU/L (ref 0–40)
Albumin: 4.4 g/dL (ref 3.5–5.5)
Alkaline Phosphatase: 57 IU/L (ref 39–117)
Bilirubin Total: 0.3 mg/dL (ref 0.0–1.2)
Bilirubin, Direct: 0.11 mg/dL (ref 0.00–0.40)
Total Protein: 7.1 g/dL (ref 6.0–8.5)

## 2017-08-29 LAB — LIPID PANEL
Chol/HDL Ratio: 4.2 ratio (ref 0.0–5.0)
Cholesterol, Total: 158 mg/dL (ref 100–199)
HDL: 38 mg/dL — ABNORMAL LOW (ref >39)
LDL Calculated: 108 mg/dL — ABNORMAL HIGH (ref 0–99)
Triglycerides: 60 mg/dL (ref 0–149)
VLDL Cholesterol Cal: 12 mg/dL (ref 5–40)

## 2017-08-30 ENCOUNTER — Other Ambulatory Visit: Payer: Self-pay | Admitting: Cardiology

## 2017-09-04 ENCOUNTER — Telehealth: Payer: Self-pay

## 2017-09-04 DIAGNOSIS — E7841 Elevated Lipoprotein(a): Secondary | ICD-10-CM

## 2017-09-04 MED ORDER — EZETIMIBE 10 MG PO TABS: 10.0000 mg | ORAL_TABLET | Freq: Every day | ORAL | 11 refills | Status: DC

## 2017-09-04 NOTE — Telephone Encounter (Signed)
Notes recorded by Phineas Semenobertson, Shakesha Soltau, RN on 09/04/2017 at 5:18 PM EDT Pt made aware of lab results. Informed pt Dr. Mayford Knifeurner recommends adding Zetia 10 mg daily due to elevated LDL level. He states he will need to check price, he was driving at the time of call, he will call back to schedule repeat lab appt. Pt thankful for the call. Order placed for FLP and LFT, needs to be scheduled   Notes recorded by Quintella Reicherturner, Traci R, MD on 08/30/2017 at 1:22 PM EDT LDL not at goal - add Zetia 10mg  daily and repeat FLP and ALT in 6 weeks

## 2017-09-14 ENCOUNTER — Other Ambulatory Visit: Payer: Self-pay | Admitting: Cardiology

## 2017-09-17 ENCOUNTER — Telehealth: Payer: Self-pay

## 2017-09-17 NOTE — Telephone Encounter (Signed)
I have done a Ezetimibe PA through covermymeds.  I received the following immediate response: Lowella DellPatrick Kozikowski Key: [id number] - PA Case ID: [id number] - Rx #: [id number]  Outcome  Approved today  PA Case [id number] Status: Approved. Authorization and Notifications Completed.

## 2017-09-18 NOTE — Telephone Encounter (Signed)
**Note De-Identified Krystofer Hevener Obfuscation** Letter received Isley Zinni [facility] stating that they have approved the pts Ezetimibe PA. Approval effective from 09/17/17 until 09/17/18.  I have notified the pts pharmacy.

## 2017-09-21 ENCOUNTER — Other Ambulatory Visit: Payer: Self-pay | Admitting: Cardiology

## 2017-09-21 DIAGNOSIS — E119 Type 2 diabetes mellitus without complications: Secondary | ICD-10-CM

## 2018-01-03 ENCOUNTER — Other Ambulatory Visit: Payer: Self-pay | Admitting: Family Medicine

## 2018-01-03 DIAGNOSIS — I10 Essential (primary) hypertension: Secondary | ICD-10-CM

## 2018-01-03 DIAGNOSIS — Z760 Encounter for issue of repeat prescription: Secondary | ICD-10-CM

## 2018-01-03 DIAGNOSIS — E119 Type 2 diabetes mellitus without complications: Secondary | ICD-10-CM

## 2018-02-19 ENCOUNTER — Other Ambulatory Visit: Payer: Self-pay

## 2018-02-19 ENCOUNTER — Encounter (HOSPITAL_COMMUNITY): Payer: Self-pay | Admitting: Emergency Medicine

## 2018-02-19 ENCOUNTER — Inpatient Hospital Stay (HOSPITAL_COMMUNITY)
Admission: EM | Admit: 2018-02-19 | Discharge: 2018-02-22 | DRG: 246 | Disposition: A | Discharge status: Home / Self Care | Payer: BLUE CROSS/BLUE SHIELD | Attending: Cardiology | Admitting: Cardiology

## 2018-02-19 ENCOUNTER — Emergency Department (HOSPITAL_COMMUNITY): Payer: BLUE CROSS/BLUE SHIELD

## 2018-02-19 DIAGNOSIS — I10 Essential (primary) hypertension: Secondary | ICD-10-CM | POA: Diagnosis present

## 2018-02-19 DIAGNOSIS — Z7902 Long term (current) use of antithrombotics/antiplatelets: Secondary | ICD-10-CM

## 2018-02-19 DIAGNOSIS — R0602 Shortness of breath: Secondary | ICD-10-CM | POA: Diagnosis not present

## 2018-02-19 DIAGNOSIS — R079 Chest pain, unspecified: Secondary | ICD-10-CM | POA: Diagnosis not present

## 2018-02-19 DIAGNOSIS — Y831 Surgical operation with implant of artificial internal device as the cause of abnormal reaction of the patient, or of later complication, without mention of misadventure at the time of the procedure: Secondary | ICD-10-CM | POA: Diagnosis present

## 2018-02-19 DIAGNOSIS — Z7951 Long term (current) use of inhaled steroids: Secondary | ICD-10-CM | POA: Diagnosis not present

## 2018-02-19 DIAGNOSIS — E7800 Pure hypercholesterolemia, unspecified: Secondary | ICD-10-CM | POA: Diagnosis not present

## 2018-02-19 DIAGNOSIS — R52 Pain, unspecified: Secondary | ICD-10-CM | POA: Diagnosis not present

## 2018-02-19 DIAGNOSIS — Z7982 Long term (current) use of aspirin: Secondary | ICD-10-CM

## 2018-02-19 DIAGNOSIS — Z8249 Family history of ischemic heart disease and other diseases of the circulatory system: Secondary | ICD-10-CM

## 2018-02-19 DIAGNOSIS — M1990 Unspecified osteoarthritis, unspecified site: Secondary | ICD-10-CM | POA: Diagnosis not present

## 2018-02-19 DIAGNOSIS — E119 Type 2 diabetes mellitus without complications: Secondary | ICD-10-CM | POA: Diagnosis not present

## 2018-02-19 DIAGNOSIS — Z6841 Body Mass Index (BMI) 40.0 and over, adult: Secondary | ICD-10-CM | POA: Diagnosis not present

## 2018-02-19 DIAGNOSIS — I4430 Unspecified atrioventricular block: Secondary | ICD-10-CM | POA: Diagnosis not present

## 2018-02-19 DIAGNOSIS — I480 Paroxysmal atrial fibrillation: Secondary | ICD-10-CM | POA: Diagnosis present

## 2018-02-19 DIAGNOSIS — E785 Hyperlipidemia, unspecified: Secondary | ICD-10-CM | POA: Diagnosis present

## 2018-02-19 DIAGNOSIS — G4733 Obstructive sleep apnea (adult) (pediatric): Secondary | ICD-10-CM | POA: Diagnosis not present

## 2018-02-19 DIAGNOSIS — Z833 Family history of diabetes mellitus: Secondary | ICD-10-CM

## 2018-02-19 DIAGNOSIS — Z955 Presence of coronary angioplasty implant and graft: Secondary | ICD-10-CM

## 2018-02-19 DIAGNOSIS — I25119 Atherosclerotic heart disease of native coronary artery with unspecified angina pectoris: Secondary | ICD-10-CM | POA: Diagnosis not present

## 2018-02-19 DIAGNOSIS — K219 Gastro-esophageal reflux disease without esophagitis: Secondary | ICD-10-CM | POA: Diagnosis not present

## 2018-02-19 DIAGNOSIS — M7989 Other specified soft tissue disorders: Secondary | ICD-10-CM | POA: Diagnosis not present

## 2018-02-19 DIAGNOSIS — T82855A Stenosis of coronary artery stent, initial encounter: Secondary | ICD-10-CM | POA: Diagnosis not present

## 2018-02-19 DIAGNOSIS — I4510 Unspecified right bundle-branch block: Secondary | ICD-10-CM | POA: Diagnosis present

## 2018-02-19 DIAGNOSIS — I2510 Atherosclerotic heart disease of native coronary artery without angina pectoris: Secondary | ICD-10-CM | POA: Diagnosis not present

## 2018-02-19 DIAGNOSIS — Z7984 Long term (current) use of oral hypoglycemic drugs: Secondary | ICD-10-CM | POA: Diagnosis not present

## 2018-02-19 DIAGNOSIS — I214 Non-ST elevation (NSTEMI) myocardial infarction: Secondary | ICD-10-CM | POA: Diagnosis present

## 2018-02-19 DIAGNOSIS — Z79899 Other long term (current) drug therapy: Secondary | ICD-10-CM | POA: Diagnosis not present

## 2018-02-19 DIAGNOSIS — I2583 Coronary atherosclerosis due to lipid rich plaque: Secondary | ICD-10-CM | POA: Diagnosis not present

## 2018-02-19 DIAGNOSIS — Z801 Family history of malignant neoplasm of trachea, bronchus and lung: Secondary | ICD-10-CM | POA: Diagnosis not present

## 2018-02-19 HISTORY — DX: Obstructive sleep apnea (adult) (pediatric): Z99.89

## 2018-02-19 HISTORY — DX: Obstructive sleep apnea (adult) (pediatric): G47.33

## 2018-02-19 HISTORY — DX: Personal history of other diseases of the musculoskeletal system and connective tissue: Z87.39

## 2018-02-19 HISTORY — DX: Personal history of urinary calculi: Z87.442

## 2018-02-19 LAB — I-STAT TROPONIN, ED: Troponin i, poc: 0.12 ng/mL (ref 0.00–0.08)

## 2018-02-19 NOTE — ED Triage Notes (Signed)
Pt reports 9/10 burning central cp that started earlier today with sob. Hx of Mis.

## 2018-02-20 ENCOUNTER — Ambulatory Visit: Payer: BLUE CROSS/BLUE SHIELD | Admitting: Family Medicine

## 2018-02-20 ENCOUNTER — Emergency Department (HOSPITAL_COMMUNITY): Payer: BLUE CROSS/BLUE SHIELD

## 2018-02-20 ENCOUNTER — Other Ambulatory Visit (HOSPITAL_COMMUNITY): Payer: BLUE CROSS/BLUE SHIELD

## 2018-02-20 ENCOUNTER — Inpatient Hospital Stay (HOSPITAL_COMMUNITY): Payer: BLUE CROSS/BLUE SHIELD

## 2018-02-20 DIAGNOSIS — K219 Gastro-esophageal reflux disease without esophagitis: Secondary | ICD-10-CM | POA: Diagnosis present

## 2018-02-20 DIAGNOSIS — I2510 Atherosclerotic heart disease of native coronary artery without angina pectoris: Secondary | ICD-10-CM | POA: Diagnosis not present

## 2018-02-20 DIAGNOSIS — I2583 Coronary atherosclerosis due to lipid rich plaque: Secondary | ICD-10-CM

## 2018-02-20 DIAGNOSIS — E119 Type 2 diabetes mellitus without complications: Secondary | ICD-10-CM | POA: Diagnosis present

## 2018-02-20 DIAGNOSIS — I4430 Unspecified atrioventricular block: Secondary | ICD-10-CM | POA: Diagnosis present

## 2018-02-20 DIAGNOSIS — I214 Non-ST elevation (NSTEMI) myocardial infarction: Secondary | ICD-10-CM

## 2018-02-20 DIAGNOSIS — Z7982 Long term (current) use of aspirin: Secondary | ICD-10-CM | POA: Diagnosis not present

## 2018-02-20 DIAGNOSIS — R52 Pain, unspecified: Secondary | ICD-10-CM | POA: Diagnosis not present

## 2018-02-20 DIAGNOSIS — Z801 Family history of malignant neoplasm of trachea, bronchus and lung: Secondary | ICD-10-CM | POA: Diagnosis not present

## 2018-02-20 DIAGNOSIS — I4510 Unspecified right bundle-branch block: Secondary | ICD-10-CM | POA: Diagnosis present

## 2018-02-20 DIAGNOSIS — E7800 Pure hypercholesterolemia, unspecified: Secondary | ICD-10-CM

## 2018-02-20 DIAGNOSIS — Z79899 Other long term (current) drug therapy: Secondary | ICD-10-CM | POA: Diagnosis not present

## 2018-02-20 DIAGNOSIS — I25119 Atherosclerotic heart disease of native coronary artery with unspecified angina pectoris: Secondary | ICD-10-CM | POA: Diagnosis not present

## 2018-02-20 DIAGNOSIS — M7989 Other specified soft tissue disorders: Secondary | ICD-10-CM

## 2018-02-20 DIAGNOSIS — Z833 Family history of diabetes mellitus: Secondary | ICD-10-CM | POA: Diagnosis not present

## 2018-02-20 DIAGNOSIS — T82855A Stenosis of coronary artery stent, initial encounter: Secondary | ICD-10-CM | POA: Diagnosis not present

## 2018-02-20 DIAGNOSIS — Z7951 Long term (current) use of inhaled steroids: Secondary | ICD-10-CM | POA: Diagnosis not present

## 2018-02-20 DIAGNOSIS — Z8249 Family history of ischemic heart disease and other diseases of the circulatory system: Secondary | ICD-10-CM | POA: Diagnosis not present

## 2018-02-20 DIAGNOSIS — Z6841 Body Mass Index (BMI) 40.0 and over, adult: Secondary | ICD-10-CM | POA: Diagnosis not present

## 2018-02-20 DIAGNOSIS — Z7902 Long term (current) use of antithrombotics/antiplatelets: Secondary | ICD-10-CM | POA: Diagnosis not present

## 2018-02-20 DIAGNOSIS — I10 Essential (primary) hypertension: Secondary | ICD-10-CM | POA: Diagnosis not present

## 2018-02-20 DIAGNOSIS — Z7984 Long term (current) use of oral hypoglycemic drugs: Secondary | ICD-10-CM | POA: Diagnosis not present

## 2018-02-20 DIAGNOSIS — Z0289 Encounter for other administrative examinations: Secondary | ICD-10-CM

## 2018-02-20 DIAGNOSIS — E785 Hyperlipidemia, unspecified: Secondary | ICD-10-CM | POA: Diagnosis present

## 2018-02-20 DIAGNOSIS — Y831 Surgical operation with implant of artificial internal device as the cause of abnormal reaction of the patient, or of later complication, without mention of misadventure at the time of the procedure: Secondary | ICD-10-CM | POA: Diagnosis present

## 2018-02-20 DIAGNOSIS — I480 Paroxysmal atrial fibrillation: Secondary | ICD-10-CM | POA: Diagnosis present

## 2018-02-20 DIAGNOSIS — R0602 Shortness of breath: Secondary | ICD-10-CM | POA: Diagnosis not present

## 2018-02-20 DIAGNOSIS — G4733 Obstructive sleep apnea (adult) (pediatric): Secondary | ICD-10-CM | POA: Diagnosis present

## 2018-02-20 DIAGNOSIS — M1990 Unspecified osteoarthritis, unspecified site: Secondary | ICD-10-CM | POA: Diagnosis present

## 2018-02-20 LAB — BASIC METABOLIC PANEL
Anion gap: 10 (ref 5–15)
BUN: 16 mg/dL (ref 6–20)
CO2: 26 mmol/L (ref 22–32)
Calcium: 9.4 mg/dL (ref 8.9–10.3)
Chloride: 104 mmol/L (ref 98–111)
Creatinine, Ser: 0.87 mg/dL (ref 0.61–1.24)
GFR calc Af Amer: >60 mL/min (ref >60)
GFR calc non Af Amer: >60 mL/min (ref >60)
Glucose, Bld: 97 mg/dL (ref 70–99)
Potassium: 4 mmol/L (ref 3.5–5.1)
Sodium: 140 mmol/L (ref 135–145)

## 2018-02-20 LAB — HEPARIN LEVEL (UNFRACTIONATED): Heparin Unfractionated: 0.92 IU/mL — ABNORMAL HIGH (ref 0.30–0.70)

## 2018-02-20 LAB — CBC
HCT: 42.1 % (ref 39.0–52.0)
Hemoglobin: 13.2 g/dL (ref 13.0–17.0)
MCH: 26.4 pg (ref 26.0–34.0)
MCHC: 31.4 g/dL (ref 30.0–36.0)
MCV: 84.2 fL (ref 80.0–100.0)
Platelets: 315 10*3/uL (ref 150–400)
RBC: 5 MIL/uL (ref 4.22–5.81)
RDW: 14.2 % (ref 11.5–15.5)
WBC: 6.1 10*3/uL (ref 4.0–10.5)
nRBC: 0 % (ref 0.0–0.2)

## 2018-02-20 LAB — TROPONIN I
Troponin I: 0.09 ng/mL (ref <0.03)
Troponin I: 0.12 ng/mL (ref <0.03)

## 2018-02-20 LAB — GLUCOSE, CAPILLARY
Glucose-Capillary: 90 mg/dL (ref 70–99)
Glucose-Capillary: 116 mg/dL — ABNORMAL HIGH (ref 70–99)

## 2018-02-20 MED ORDER — NITROGLYCERIN 0.4 MG SL SUBL: 0.4000 mg | SUBLINGUAL_TABLET | SUBLINGUAL | Status: DC | PRN

## 2018-02-20 MED ORDER — METOPROLOL TARTRATE 25 MG PO TABS
50.0000 mg | ORAL_TABLET | Freq: Two times a day (BID) | ORAL | Status: DC
  Administered 2018-02-20 – 2018-02-22 (×5): 50 mg via ORAL
  Filled 2018-02-20: qty 1
  Filled 2018-02-20: qty 2
  Filled 2018-02-20: qty 1
  Filled 2018-02-20 (×2): qty 2

## 2018-02-20 MED ORDER — ASPIRIN EC 81 MG PO TBEC
81.0000 mg | DELAYED_RELEASE_TABLET | Freq: Every day | ORAL | Status: DC
  Administered 2018-02-22: 81 mg via ORAL
  Filled 2018-02-20: qty 1

## 2018-02-20 MED ORDER — SODIUM CHLORIDE 0.9% FLUSH: 3.0000 mL | INTRAVENOUS | Status: DC | PRN

## 2018-02-20 MED ORDER — HEPARIN BOLUS VIA INFUSION
6000.0000 [IU] | Freq: Once | INTRAVENOUS | Status: AC
  Administered 2018-02-20: 6000 [IU] via INTRAVENOUS
  Filled 2018-02-20: qty 6000

## 2018-02-20 MED ORDER — ACETAMINOPHEN 325 MG PO TABS: 650.0000 mg | ORAL_TABLET | ORAL | Status: DC | PRN

## 2018-02-20 MED ORDER — ASPIRIN 81 MG PO CHEW
81.0000 mg | CHEWABLE_TABLET | ORAL | Status: AC
  Administered 2018-02-21: 81 mg via ORAL
  Filled 2018-02-20: qty 1

## 2018-02-20 MED ORDER — HEPARIN (PORCINE) 25000 UT/250ML-% IV SOLN
1550.0000 [IU]/h | INTRAVENOUS | Status: DC
  Administered 2018-02-20 (×2): 2000 [IU]/h via INTRAVENOUS
  Administered 2018-02-21: 1750 [IU]/h via INTRAVENOUS
  Filled 2018-02-20 (×3): qty 250

## 2018-02-20 MED ORDER — ATORVASTATIN CALCIUM 80 MG PO TABS
80.0000 mg | ORAL_TABLET | Freq: Every day | ORAL | Status: DC
  Administered 2018-02-20 – 2018-02-21 (×2): 80 mg via ORAL
  Filled 2018-02-20 (×2): qty 1

## 2018-02-20 MED ORDER — SODIUM CHLORIDE 0.9% FLUSH: 3.0000 mL | Freq: Two times a day (BID) | INTRAVENOUS | Status: DC

## 2018-02-20 MED ORDER — ONDANSETRON HCL 4 MG/2ML IJ SOLN: 4.0000 mg | Freq: Four times a day (QID) | INTRAMUSCULAR | Status: DC | PRN

## 2018-02-20 MED ORDER — SODIUM CHLORIDE 0.9 % IV SOLN
INTRAVENOUS | Status: DC
  Administered 2018-02-21: 12:00:00 via INTRAVENOUS

## 2018-02-20 MED ORDER — SODIUM CHLORIDE 0.9 % IV SOLN
250.0000 mL | INTRAVENOUS | Status: DC | PRN
  Administered 2018-02-20: 250 mL via INTRAVENOUS

## 2018-02-20 MED ORDER — INSULIN ASPART 100 UNIT/ML ~~LOC~~ SOLN: 0.0000 [IU] | Freq: Three times a day (TID) | SUBCUTANEOUS | Status: DC

## 2018-02-20 MED ORDER — AMLODIPINE BESYLATE 5 MG PO TABS
5.0000 mg | ORAL_TABLET | Freq: Every day | ORAL | Status: DC
  Administered 2018-02-20 – 2018-02-22 (×3): 5 mg via ORAL
  Filled 2018-02-20 (×3): qty 1

## 2018-02-20 MED ORDER — LISINOPRIL 40 MG PO TABS
40.0000 mg | ORAL_TABLET | Freq: Every day | ORAL | Status: DC
  Administered 2018-02-20 – 2018-02-22 (×3): 40 mg via ORAL
  Filled 2018-02-20: qty 1
  Filled 2018-02-20: qty 2
  Filled 2018-02-20: qty 1

## 2018-02-20 MED ORDER — IOPAMIDOL (ISOVUE-370) INJECTION 76%
100.0000 mL | Freq: Once | INTRAVENOUS | Status: AC | PRN
  Administered 2018-02-20: 100 mL via INTRAVENOUS

## 2018-02-20 MED ORDER — IOPAMIDOL (ISOVUE-370) INJECTION 76%
INTRAVENOUS | Status: AC
  Filled 2018-02-20: qty 100

## 2018-02-20 MED ORDER — INSULIN ASPART 100 UNIT/ML ~~LOC~~ SOLN: 0.0000 [IU] | Freq: Every day | SUBCUTANEOUS | Status: DC

## 2018-02-20 MED ORDER — IOPAMIDOL (ISOVUE-370) INJECTION 76%
INTRAVENOUS | Status: AC
  Filled 2018-02-20: qty 50

## 2018-02-20 NOTE — Progress Notes (Signed)
ANTICOAGULATION CONSULT NOTE  Pharmacy Consult for Heparin Indication: chest pain/ACS  No Known Allergies  Patient Measurements: Height: 6\' 2"  (188 cm) Weight: (!) 390 lb (176.9 kg) IBW/kg (Calculated) : 82.2 Heparin Dosing Weight: 125 kg  Vital Signs: BP: 119/67 (12/11 1300) Pulse Rate: 54 (12/11 1300)  Labs: Recent Labs    02/19/18 2334 02/20/18 0040 02/20/18 0528 02/20/18 1223  HGB 13.2  --   --   --   HCT 42.1  --   --   --   PLT 315  --   --   --   HEPARINUNFRC  --   --   --  0.92*  CREATININE 0.87  --   --   --   TROPONINI  --  0.09* 0.12*  --     Estimated Creatinine Clearance: 170.6 mL/min (by C-G formula based on SCr of 0.87 mg/dL).   Medical History: Past Medical History:  Diagnosis Date  . Arthritis   . Asthma   . 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  . Chest pain 03/18/2014  . Coronary artery disease    a. 2013: cath showing nonobstructive CAD with 50% OM, 20-30% dLAD, 30-40%dRCA  b. 12/2015: NSTEMI w/ 99% stenosis dLAD (DES placed), 99% stenosis 2nd Mrg (DES placed), apical LAD stenosis too small for PCI and mild disease in the RCA.   Marland Kitchen DIABETES MELLITUS, TYPE II 10/17/2006  . ED (erectile dysfunction)   . GERD (gastroesophageal reflux disease)   . HYPERLIPIDEMIA 10/17/2006  . HYPERTENSION 10/17/2006  . NSTEMI (non-ST elevated myocardial infarction) (HCC) 12/16/2015  . OBESITY 10/17/2006  . Paroxysmal atrial fibrillation (HCC)    2006 - was on coumadin - took himself off 6 mos after initiation.  . Shortness of breath   . Sleep apnea    not using CPAP regularly    Medications:  Current Facility-Administered Medications on File Prior to Encounter  Medication Dose Route Frequency Provider Last Rate Last Dose  . ondansetron (ZOFRAN-ODT) disintegrating tablet 4 mg  4 mg Oral Once Roderick Pee, MD       Current Outpatient Medications on File Prior to Encounter  Medication Sig Dispense Refill  . albuterol (VENTOLIN HFA) 108 (90 Base) MCG/ACT inhaler Inhale 2 puffs into  the lungs every 6 (six) hours as needed. (Patient taking differently: Inhale 2 puffs into the lungs every 6 (six) hours as needed for wheezing or shortness of breath. ) 1 Inhaler 1  . amLODipine (NORVASC) 5 MG tablet Take 1 tablet (5 mg total) by mouth daily. 30 tablet 10  . aspirin EC 81 MG tablet Take 1 tablet (81 mg total) by mouth daily. 90 tablet 3  . atorvastatin (LIPITOR) 80 MG tablet Take 1 tablet (80 mg total) by mouth daily at 6 PM. 90 tablet 3  . clopidogrel (PLAVIX) 75 MG tablet Take 1 tablet (75 mg total) by mouth daily. 90 tablet 3  . ezetimibe (ZETIA) 10 MG tablet Take 1 tablet (10 mg total) by mouth daily. 30 tablet 11  . furosemide (LASIX) 40 MG tablet TAKE 1 TABLET BY MOUTH TWICE DAILY (Patient taking differently: Take 40 mg by mouth 2 (two) times daily. ) 60 tablet 5  . glipiZIDE (GLUCOTROL) 5 MG tablet TAKE 1 TABLET BY MOUTH TWICE DAILY BEFORE MEAL(S) (Patient taking differently: Take 5 mg by mouth 2 (two) times daily before a meal. ) 60 tablet 5  . isosorbide mononitrate (IMDUR) 30 MG 24 hr tablet TAKE 1 TABLET BY MOUTH ONCE DAILY (Patient taking differently:  Take 30 mg by mouth daily. ) 90 tablet 3  . lisinopril (PRINIVIL,ZESTRIL) 40 MG tablet Take 1 tablet (40 mg total) by mouth daily. 90 tablet 3  . metFORMIN (GLUCOPHAGE-XR) 750 MG 24 hr tablet Take 1 tablet (750 mg total) by mouth daily with breakfast. 30 tablet 11  . metoprolol tartrate (LOPRESSOR) 50 MG tablet Take 1 tablet (50 mg total) by mouth 2 (two) times daily. 180 tablet 3  . potassium chloride SA (K-DUR,KLOR-CON) 20 MEQ tablet TAKE 2 TABLETS BY MOUTH IN THE MORNING (Patient taking differently: Take 40 mEq by mouth daily. ) 200 tablet 5  . fluticasone (FLONASE) 50 MCG/ACT nasal spray Place 1 spray into both nostrils 2 (two) times daily. (Patient not taking: Reported on 02/20/2018) 16 g 0  . [DISCONTINUED] BOOSTRIX 5-2.5-18.5 injection        Assessment: 52 y.o. male with chest pain for heparin- not on anticoag PTA.    Heparin level came back supratherapeutic at 0.92, on 2000 units/hr. Hgb 13.2, plt 315. Trop at 0.12. No s/sx of bleeding. No infusion issues.  Goal of Therapy:  Heparin level 0.3-0.7 units/ml Monitor platelets by anticoagulation protocol: Yes   Plan:  Decrease infusion to 1750 units/hr  Check heparin level in 6 hours or after cardiac cath Monitor daily HL, CBC, and for s/sx of bleeding  Sherron MondayKimberly Maddilyn Campus, PharmD, BCCCP Clinical Pharmacist  Pager: [phone] Phone: [phone] 02/20/2018,1:31 PM

## 2018-02-20 NOTE — ED Notes (Signed)
MD notified of increase in troponin. 

## 2018-02-20 NOTE — Progress Notes (Signed)
Right lower extremity venous duplex completed - There is no evidence of DVT or Baker's cyst. [facility] Kirchman,RVS 02/20/2018 9:34 AM

## 2018-02-20 NOTE — ED Notes (Signed)
Pt verbalized that he has had several incidents of intense  burning substernal sensation.  Pt not doing anything when it occurred. Pt reports SOB, denies sweating, weakness, loc.

## 2018-02-20 NOTE — ED Notes (Signed)
Breakfast ordered 

## 2018-02-20 NOTE — H&P (Signed)
Cardiology Admission History and Physical:   Patient ID: Raymond Lutz; MRN: [id number]; DOB: [birth year]   Admission date: 02/19/2018  Primary Care Provider:  Nelwyn SalisburyFry, Stephen A, MD Primary Cardiologist:  Armanda Magicurner, Traci   Chief Complaint: Chest pain  History of Present Illness:   Raymond Glassmanatrick D Strandberg is a 52 y.o. male with a history of coronary artery disease status post PCI of the distal LAD and OM 2 in 2017, morbid obesity, obstructive sleep apnea (not on CPAP) type 2 diabetes mellitus, hyperlipidemia, hypertension, peripheral vascular disease and paroxysmal atrial fibrillation (not on anticoagulation) who presents to the hospital with complaints of substernal chest pain.  The patient states that yesterday he had chest heaviness at around 2 in the afternoon.  He then had recurrent pain at around 9 PM that lasted for approximately 40 minutes.  He had some expired nitroglycerin at home that he took without any relief.  He denied any associated shortness of breath, diaphoresis, nausea or vomiting.  He has been noticing increased right lower extremity edema.  He denies any fevers or chills.  He has been compliant with all of his medications.  Prior to yesterday he had been doing relatively well and was fairly active without any provocation of chest pain or shortness of breath on exertion.  His last cardiac catheterization was on 12/17/2015 that revealed 99% distal LAD, 99% OM 2, and 30% distal RCA stenoses.  He underwent placement of DES to distal LAD and OM 2.  Afterwards he was treated with aspirin and Brilinta.  On subsequent clinic visits for complaints of residual anginal pain Imdur was added.  In the ED his ECG revealed normal sinus rhythm, rate 67 bpm and an incomplete right bundle branch block.  The troponin was mildly elevated at 0.09.  A CT pulmonary angiogram was negative for a PE (given his right lower extremity edema at presentation).   Past Medical History:  Diagnosis Date  . Arthritis     . Asthma   . 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  . Chest pain 03/18/2014  . Coronary artery disease    a. 2013: cath showing nonobstructive CAD with 50% OM, 20-30% dLAD, 30-40%dRCA  b. 12/2015: NSTEMI w/ 99% stenosis dLAD (DES placed), 99% stenosis 2nd Mrg (DES placed), apical LAD stenosis too small for PCI and mild disease in the RCA.   Marland Kitchen. DIABETES MELLITUS, TYPE II 10/17/2006  . ED (erectile dysfunction)   . GERD (gastroesophageal reflux disease)   . HYPERLIPIDEMIA 10/17/2006  . HYPERTENSION 10/17/2006  . NSTEMI (non-ST elevated myocardial infarction) (HCC) 12/16/2015  . OBESITY 10/17/2006  . Paroxysmal atrial fibrillation (HCC)    2006 - was on coumadin - took himself off 6 mos after initiation.  . Shortness of breath   . Sleep apnea    not using CPAP regularly    Past Surgical History:  Procedure Laterality Date  . arthroscopic knee    . CARDIAC CATHETERIZATION    . CARDIAC CATHETERIZATION N/A 12/17/2015   Procedure: Left Heart Cath and Coronary Angiography;  Surgeon: Kathleene Hazelhristopher D McAlhany, MD;  [facility];  Service: Cardiovascular;  Laterality: N/A;  . CARDIAC CATHETERIZATION N/A 12/17/2015   Procedure: Coronary Stent Intervention;  Surgeon: Kathleene Hazelhristopher D McAlhany, MD;  Location: [facility] INVASIVE CV LAB;  Service: Cardiovascular;  Laterality: N/A;  . HERNIA REPAIR    . HERNIA REPAIR    . LEFT HEART CATHETERIZATION WITH CORONARY ANGIOGRAM N/A 05/03/2011   Procedure: LEFT HEART CATHETERIZATION WITH CORONARY ANGIOGRAM;  Surgeon: Jerolyn CenterMuhammad  Argentina Donovan, MD;  Location: [facility] CATH LAB;  Service: Cardiovascular;  Laterality: N/A;  . skin grafts       Medications Prior to Admission: Prior to Admission medications   Medication Sig Start Date End Date Taking? Authorizing Provider  albuterol (VENTOLIN HFA) 108 (90 Base) MCG/ACT inhaler Inhale 2 puffs into the lungs every 6 (six) hours as needed. Patient taking differently: Inhale 2 puffs into the lungs every 6 (six) hours as needed for wheezing or shortness  of breath.  04/06/17  Yes [place], Betty G, MD  amLODipine (NORVASC) 5 MG tablet Take 1 tablet (5 mg total) by mouth daily. 09/14/17  Yes Quintella Reichert, MD  aspirin EC 81 MG tablet Take 1 tablet (81 mg total) by mouth daily. 03/22/17  Yes Turner, Cornelious Bryant, MD  atorvastatin (LIPITOR) 80 MG tablet Take 1 tablet (80 mg total) by mouth daily at 6 PM. 09/24/17  Yes Turner, Cornelious Bryant, MD  clopidogrel (PLAVIX) 75 MG tablet Take 1 tablet (75 mg total) by mouth daily. 03/22/17  Yes Turner, Cornelious Bryant, MD  ezetimibe (ZETIA) 10 MG tablet Take 1 tablet (10 mg total) by mouth daily. 09/04/17  Yes Turner, Cornelious Bryant, MD  furosemide (LASIX) 40 MG tablet TAKE 1 TABLET BY MOUTH TWICE DAILY Patient taking differently: Take 40 mg by mouth 2 (two) times daily.  01/04/18  Yes Roderick Pee, MD  glipiZIDE (GLUCOTROL) 5 MG tablet TAKE 1 TABLET BY MOUTH TWICE DAILY BEFORE MEAL(S) Patient taking differently: Take 5 mg by mouth 2 (two) times daily before a meal.  01/04/18  Yes Roderick Pee, MD  isosorbide mononitrate (IMDUR) 30 MG 24 hr tablet TAKE 1 TABLET BY MOUTH ONCE DAILY Patient taking differently: Take 30 mg by mouth daily.  08/31/17  Yes Turner, Cornelious Bryant, MD  lisinopril (PRINIVIL,ZESTRIL) 40 MG tablet Take 1 tablet (40 mg total) by mouth daily. 09/24/17  Yes Quintella Reichert, MD  metFORMIN (GLUCOPHAGE-XR) 750 MG 24 hr tablet Take 1 tablet (750 mg total) by mouth daily with breakfast. 12/10/15  Yes Nafziger, Kandee Keen, NP  metoprolol tartrate (LOPRESSOR) 50 MG tablet Take 1 tablet (50 mg total) by mouth 2 (two) times daily. 09/24/17  Yes Turner, Cornelious Bryant, MD  potassium chloride SA (K-DUR,KLOR-CON) 20 MEQ tablet TAKE 2 TABLETS BY MOUTH IN THE MORNING Patient taking differently: Take 40 mEq by mouth daily.  07/16/17  Yes Roderick Pee, MD  fluticasone (FLONASE) 50 MCG/ACT nasal spray Place 1 spray into both nostrils 2 (two) times daily. Patient not taking: Reported on 02/20/2018 04/06/17   [place], Betty G, MD     Allergies:   No Known  Allergies  Social History:   Social History   Socioeconomic History  . Marital status: Married    Spouse name: Not on file  . Number of children: Not on file  . Years of education: Not on file  . Highest education level: Not on file  Occupational History  . Not on file  Social Needs  . Financial resource strain: Not on file  . Food insecurity:    Worry: Not on file    Inability: Not on file  . Transportation needs:    Medical: Not on file    Non-medical: Not on file  Tobacco Use  . Smoking status: Never Smoker  . Smokeless tobacco: Never Used  Substance and Sexual Activity  . Alcohol use: No  . Drug use: No  . Sexual activity: Yes  Lifestyle  .  Physical activity:    Days per week: Not on file    Minutes per session: Not on file  . Stress: Not on file  Relationships  . Social connections:    Talks on phone: Not on file    Gets together: Not on file    Attends religious service: Not on file    Active member of club or organization: Not on file    Attends meetings of clubs or organizations: Not on file    Relationship status: Not on file  . Intimate partner violence:    Fear of current or ex partner: Not on file    Emotionally abused: Not on file    Physically abused: Not on file    Forced sexual activity: Not on file  Other Topics Concern  . Not on file  Social History Narrative   Works at [employer].  Lives in [place] with wife - 2 children - 13, 10.     Family History:   The patient's family history includes Cerebral aneurysm in his mother; Diabetes in his maternal grandmother; Heart attack in his brother and sister; Hypertension in his brother; Lung cancer in his father.     Review of Systems: [y] = yes, [ ]  = no   . General: Weight gain [ ] ; Weight loss [ ] ; Anorexia [ ] ; Fatigue [ ] ; Fever [ ] ; Chills [ ] ; Weakness [ ]   . Cardiac: Chest pain/pressure [place] ]; Resting SOB [ ] ; Exertional SOB [ ] ; Orthopnea [ ] ; Pedal Edema [ ] ; Palpitations [ ] ; Syncope  [ ] ; Presyncope [ ] ; Paroxysmal nocturnal dyspnea[ ]   . Pulmonary: Cough [ ] ; Wheezing[ ] ; Hemoptysis[ ] ; Sputum [ ] ; Snoring [ ]   . GI: Vomiting[ ] ; Dysphagia[ ] ; Melena[ ] ; Hematochezia [ ] ; Heartburn[ ] ; Abdominal pain [ ] ; Constipation [ ] ; Diarrhea [ ] ; BRBPR [ ]   . GU: Hematuria[ ] ; Dysuria [ ] ; Nocturia[ ]   . Vascular: Pain in legs with walking [ ] ; Pain in feet with lying flat [ ] ; Non-healing sores [ ] ; Stroke [ ] ; TIA [ ] ; Slurred speech [ ] ;  . Neuro: Headaches[ ] ; Vertigo[ ] ; Seizures[ ] ; Paresthesias[ ] ;Blurred vision [ ] ; Diplopia [ ] ; Vision changes [ ]   . Ortho/Skin: Arthritis [ ] ; Joint pain [ ] ; Muscle pain [ ] ; Joint swelling [ ] ; Back Pain [ ] ; Rash [ ]   . Psych: Depression[ ] ; Anxiety[ ]   . Heme: Bleeding problems [ ] ; Clotting disorders [ ] ; Anemia [ ]   . Endocrine: Diabetes [ ] ; Thyroid dysfunction[ ]      Physical Exam/Data:   Vitals:   02/20/18 0215 02/20/18 0245 02/20/18 0315 02/20/18 0345  BP: 136/69 (!) 141/76 (!) 154/72 130/87  Pulse: 64 (!) 59 64 (!) 59  Resp: 11 10 15 10   Temp:      TempSrc:      SpO2: 100% 99% 100% 100%  Weight:      Height:       No intake or output data in the 24 hours ending 02/20/18 0409 Filed Weights   02/19/18 2324  Weight: (!) 176.9 kg   Body mass index is 50.07 kg/m.  General:  Well nourished, well developed, in no acute distress HEENT: normal Lymph: no adenopathy Neck: no JVD Endocrine:  No thryomegaly Vascular: No carotid bruits; FA pulses 2+ bilaterally without bruits  Cardiac:  normal S1, S2; RRR, 1/6 systolic murmur Lungs:  clear to auscultation bilaterally, no wheezing, rhonchi or rales  Abd: soft, nontender, no hepatomegaly  Ext: Right lower extremity edema Musculoskeletal:  No deformities, BUE and BLE strength normal and equal Skin: warm and dry  Neuro:  CNs 2-12 intact, no focal abnormalities noted Psych:  Normal affect      Laboratory Data:  Chemistry Recent Labs  Lab 02/19/18 2334  NA 140  K  4.0  CL 104  CO2 26  GLUCOSE 97  BUN 16  CREATININE 0.87  CALCIUM 9.4  GFRNONAA >60  GFRAA >60  ANIONGAP 10    No results for input(s): PROT, ALBUMIN, AST, ALT, ALKPHOS, BILITOT in the last 168 hours. Hematology Recent Labs  Lab 02/19/18 2334  WBC 6.1  RBC 5.00  HGB 13.2  HCT 42.1  MCV 84.2  MCH 26.4  MCHC 31.4  RDW 14.2  PLT 315   Cardiac Enzymes Recent Labs  Lab 02/20/18 0040  TROPONINI 0.09*    Recent Labs  Lab 02/19/18 2340  TROPIPOC 0.12*    BNPNo results for input(s): BNP, PROBNP in the last 168 hours.  DDimer No results for input(s): DDIMER in the last 168 hours.  Radiology/Studies:  Dg Chest 2 View  Result Date: 02/19/2018 CLINICAL DATA:  Chest pain EXAM: CHEST - 2 VIEW COMPARISON:  12/16/2015 FINDINGS: The heart size and mediastinal contours are within normal limits. Both lungs are clear. Degenerative osteophytes of the spine. IMPRESSION: No active cardiopulmonary disease. Electronically Signed   By: Jasmine Pang M.D.   On: 02/19/2018 23:46   Ct Angio Chest Pe W And/or Wo Contrast  Result Date: 02/20/2018 CLINICAL DATA:  52 year old male with shortness of breath. Concern for pulmonary embolism. EXAM: CT ANGIOGRAPHY CHEST WITH CONTRAST TECHNIQUE: Multidetector CT imaging of the chest was performed using the standard protocol during bolus administration of intravenous contrast. Multiplanar CT image reconstructions and MIPs were obtained to evaluate the vascular anatomy. CONTRAST:  ISOVUE-370 IOPAMIDOL (ISOVUE-370) INJECTION 76% COMPARISON:  Chest radiograph dated 02/19/2018 FINDINGS: Cardiovascular: There is mild cardiomegaly. Multi vessel coronary vascular calcification. No pericardial effusion. The thoracic aorta is unremarkable. Evaluation of the pulmonary arteries is limited due to suboptimal opacification and visualization of the peripheral branches. No large or central pulmonary artery embolus identified. Mediastinum/Nodes: There is no hilar or  mediastinal adenopathy. Esophagus and the thyroid gland are grossly unremarkable. No mediastinal fluid collection. Lungs/Pleura: Minimal subpleural right lung base hazy nodularity, likely atelectatic changes. There is no consolidative changes. No pleural effusion or pneumothorax. The central airways are patent. Upper Abdomen: No acute abnormality. Musculoskeletal: Degenerative changes of the spine. No acute osseous pathology. Review of the MIP images confirms the above findings. IMPRESSION: No acute intrathoracic pathology. No CT evidence of central pulmonary artery embolus. Electronically Signed   By: Elgie Collard M.D.   On: 02/20/2018 01:41    Assessment and Plan:   1.  Non-ST elevation myocardial infarction  -Admit to stepdown -Cycle cardiac biomarkers -Obtain serial ECGs -Continue aspirin and Plavix -Continue Zetia and atorvastatin at current doses, check a lipid panel -Continue Lasix daily -Continue amlodipine, lisinopril, and metoprolol -Transthoracic echocardiogram to evaluate the LV systolic and diastolic function -Keep the patient NPO for possible cardiac catheterization to delineate the coronary anatomy    Severity of Illness: The appropriate patient status for this patient is INPATIENT. Inpatient status is judged to be reasonable and necessary in order to provide the required intensity of service to ensure the patient's safety. The patient's presenting symptoms, physical exam findings, and initial radiographic and laboratory data in the context  of their chronic comorbidities is felt to place them at high risk for further clinical deterioration. Furthermore, it is not anticipated that the patient will be medically stable for discharge from the hospital within 2 midnights of admission. The following factors support the patient status of inpatient.   " The patient's presenting symptoms include chest pain. " The worrisome physical exam findings include normal cardiac  auscultation. " The initial radiographic and laboratory data are worrisome because of elevated troponin. " The chronic co-morbidities include hyperlipidemia.   * I certify that at the point of admission it is my clinical judgment that the patient will require inpatient hospital care spanning beyond 2 midnights from the point of admission due to high intensity of service, high risk for further deterioration and high frequency of surveillance required.*    For questions or updates, please contact [facility] Please consult [email or web address] for contact info under Cardiology/STEMI.    Signed, Lonie Peak, MD  02/20/2018 4:09 AM

## 2018-02-20 NOTE — Progress Notes (Addendum)
Progress Note  Patient Name: Raymond Lutz Date of Encounter: 02/20/2018  Primary Cardiologist: No primary care provider on file.   Subjective   He denies any further chest burning or SOB  Inpatient Medications    Scheduled Meds: . amLODipine  5 mg Oral Daily  . [START ON 02/21/2018] aspirin EC  81 mg Oral Daily  . atorvastatin  80 mg Oral q1800  . lisinopril  40 mg Oral Daily  . metoprolol tartrate  50 mg Oral BID  . ondansetron  4 mg Oral Once   Continuous Infusions: . heparin 2,000 Units/hr (02/20/18 0254)   PRN Meds: acetaminophen, nitroGLYCERIN, ondansetron (ZOFRAN) IV   Vital Signs    Vitals:   02/20/18 0730 02/20/18 0745 02/20/18 0800 02/20/18 0815  BP: (!) 152/82 (!) 157/64 (!) 161/97 (!) 143/66  Pulse: (!) 56 65 60 71  Resp: 12 15 13 19   Temp:      TempSrc:      SpO2: 100% 100% 97% 100%  Weight:      Height:       No intake or output data in the 24 hours ending 02/20/18 1225 Filed Weights   02/19/18 2324  Weight: (!) 176.9 kg    Telemetry    NSR - Personally Reviewed  ECG    NSR with no ST changes - Personally Reviewed  Physical Exam   GEN: No acute distress.  Morbidly obese Neck: No JVD Cardiac: RRR, no murmurs, rubs, or gallops.  Respiratory: Clear to auscultation bilaterally. GI: Soft, nontender, non-distended  MS: No edema; No deformity. Neuro:  Nonfocal  Psych: Normal affect   Labs    Chemistry Recent Labs  Lab 02/19/18 2334  NA 140  K 4.0  CL 104  CO2 26  GLUCOSE 97  BUN 16  CREATININE 0.87  CALCIUM 9.4  GFRNONAA >60  GFRAA >60  ANIONGAP 10     Hematology Recent Labs  Lab 02/19/18 2334  WBC 6.1  RBC 5.00  HGB 13.2  HCT 42.1  MCV 84.2  MCH 26.4  MCHC 31.4  RDW 14.2  PLT 315    Cardiac Enzymes Recent Labs  Lab 02/20/18 0040 02/20/18 0528  TROPONINI 0.09* 0.12*    Recent Labs  Lab 02/19/18 2340  TROPIPOC 0.12*     BNPNo results for input(s): BNP, PROBNP in the last 168 hours.   DDimer  No results for input(s): DDIMER in the last 168 hours.   Radiology    Dg Chest 2 View  Result Date: 02/19/2018 CLINICAL DATA:  Chest pain EXAM: CHEST - 2 VIEW COMPARISON:  12/16/2015 FINDINGS: The heart size and mediastinal contours are within normal limits. Both lungs are clear. Degenerative osteophytes of the spine. IMPRESSION: No active cardiopulmonary disease. Electronically Signed   By: Jasmine Pang M.D.   On: 02/19/2018 23:46   Ct Angio Chest Pe W And/or Wo Contrast  Result Date: 02/20/2018 CLINICAL DATA:  52 year old male with shortness of breath. Concern for pulmonary embolism. EXAM: CT ANGIOGRAPHY CHEST WITH CONTRAST TECHNIQUE: Multidetector CT imaging of the chest was performed using the standard protocol during bolus administration of intravenous contrast. Multiplanar CT image reconstructions and MIPs were obtained to evaluate the vascular anatomy. CONTRAST:  ISOVUE-370 IOPAMIDOL (ISOVUE-370) INJECTION 76% COMPARISON:  Chest radiograph dated 02/19/2018 FINDINGS: Cardiovascular: There is mild cardiomegaly. Multi vessel coronary vascular calcification. No pericardial effusion. The thoracic aorta is unremarkable. Evaluation of the pulmonary arteries is limited due to suboptimal opacification and visualization of the  peripheral branches. No large or central pulmonary artery embolus identified. Mediastinum/Nodes: There is no hilar or mediastinal adenopathy. Esophagus and the thyroid gland are grossly unremarkable. No mediastinal fluid collection. Lungs/Pleura: Minimal subpleural right lung base hazy nodularity, likely atelectatic changes. There is no consolidative changes. No pleural effusion or pneumothorax. The central airways are patent. Upper Abdomen: No acute abnormality. Musculoskeletal: Degenerative changes of the spine. No acute osseous pathology. Review of the MIP images confirms the above findings. IMPRESSION: No acute intrathoracic pathology. No CT evidence of central pulmonary  artery embolus. Electronically Signed   By: Elgie CollardArash  Radparvar M.D.   On: 02/20/2018 01:41   Vas [place] Lower Extremity Venous (dvt) (only [facility])  Result Date: 02/20/2018  Lower Venous Study Indications: Pain, and Swelling.  Risk Factors: Obesity. Mobidly greaer than 179 Kg. Performing Technologist: Toma DeitersVirginia Slaughter RVS  Examination Guidelines: A complete evaluation includes B-mode imaging, spectral Doppler, color Doppler, and power Doppler as needed of all accessible portions of each vessel. Bilateral testing is considered an integral part of a complete examination. Limited examinations for reoccurring indications may be performed as noted.  Right Venous Findings: +---------+---------------+---------+-----------+----------+-------------------+          CompressibilityPhasicitySpontaneityPropertiesSummary             +---------+---------------+---------+-----------+----------+-------------------+ CFV      Full           Yes      Yes                                      +---------+---------------+---------+-----------+----------+-------------------+ SFJ      Full                                                             +---------+---------------+---------+-----------+----------+-------------------+ FV Prox  Full           Yes      Yes                                      +---------+---------------+---------+-----------+----------+-------------------+ FV Mid   Full                                                             +---------+---------------+---------+-----------+----------+-------------------+ FV DistalFull           Yes      Yes                                      +---------+---------------+---------+-----------+----------+-------------------+ PFV      Full           Yes      Yes                                      +---------+---------------+---------+-----------+----------+-------------------+ POP  Full           Yes      Yes                                       +---------+---------------+---------+-----------+----------+-------------------+ PTV      Full                                                             +---------+---------------+---------+-----------+----------+-------------------+ PERO                                                  Difficult to image                                                        due to body habitus +---------+---------------+---------+-----------+----------+-------------------+  Left Venous Findings: +---+---------------+---------+-----------+----------+-------+    CompressibilityPhasicitySpontaneityPropertiesSummary +---+---------------+---------+-----------+----------+-------+ CFVFull           Yes      Yes                          +---+---------------+---------+-----------+----------+-------+ SFJFull                                                 +---+---------------+---------+-----------+----------+-------+    Summary: Right: There is no evidence of deep vein thrombosis in the lower extremity. No cystic structure found in the popliteal fossa. Left: No evidence of a common femoral vein obstruction.  *See table(s) above for measurements and observations.    Preliminary     Cardiac Studies   Cardiac Cath 12/2015  Dist RCA lesion, 30 %stenosed.  Prox RCA to Mid RCA lesion, 10 %stenosed.  2nd Mrg-1 lesion, 99 %stenosed.  RPDA lesion, 20 %stenosed.  Ost Cx to Prox Cx lesion, 10 %stenosed.  Dist LAD-2 lesion, 99 %stenosed.  A STENT PROMUS PREM MR 2.25X28 drug eluting stent was successfully placed.  2nd Mrg-2 lesion, 99 %stenosed.  Post intervention, there is a 0% residual stenosis.  A STENT PROMUS PREM MR 2.25X8 drug eluting stent was successfully placed.  Dist LAD-1 lesion, 99 %stenosed.  Post intervention, there is a 0% residual stenosis.   1. Double vessel CAD 2. NSTEMI 3. Severe stenosis second obtuse marginal branch. Tandem 99%  stenoses in OM2. Successful PTCA/DES x 1 OM2. 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  4. Severe stenosis distal LAD treated successfully with PTCA/DES x 1. The apical LAD stenosis is in the very small apical LAD and is too small for PCI.  5. Mild non-obstructive disease in the RCA  Recommendations: Will need DAPT with ASA and Brilinta for one year. Continue beta blocker and statin.  2D echo 12/2015 Study Conclusions  - Left ventricle: The cavity  size was normal. Wall thickness was   increased in a pattern of severe LVH. Systolic function was   normal. The estimated ejection fraction was in the range of 60%   to 65%. Left ventricular diastolic function parameters were   normal. - Left atrium: The atrium was moderately dilated. - Atrial septum: No defect or patent foramen ovale was identified.  Patient Profile     52 y.o. male with a history of ASCAD with cath 12/17/2015 that revealed 99% distal LAD, 99% OM 2, and 30% distal RCA stenoses.  He underwent placement of DES to distal LAD and OM 2.  Afterwards he was treated with aspirin and Brilinta.  Admitted to ER with complaints of chest burning and increased SOB for over 30 minutes and trop was mildly elevated.   Assessment & Plan    1.  Chest pain/NSTEMI -admitted with chest burning and elevated troponin -Sx similar to sx prior to PCI but burning more severe this time -trop mildly elevated (0.12>0.09>0.12) -Sx resolved with 1 SL NTG -He has eaten this am and cath lab is full -make NPO after MN -LHC tomorrow to redefine coronary anatomy and assess patency of stents -Cardiac catheterization was discussed with the patient fully. The patient understands that risks include but are not limited to stroke (1 in 1000), death (1 in 1000), kidney failure [usually temporary] (1 in 500), bleeding (1 in 200), allergic reaction [possibly serious] (1 in 200).  The patient understands and is willing to proceed.   -continue  ASA, IV Heparin gtt, statin and BB.  2.  ASCAD  - cath 12/17/2015 in the setting of NSTEMI showed 30% dRCA, 99% OM2, 99% distal LAD and underwent DES to the distal LAD and OM2 and he is on DPAT with ASA and Brilinta.  -There was an apical LAD stenosis which was too small for PCI along with mild nonobstructive disease in the RCA.   -continue ASA 81mg  daily, high dose statin and BB  2.  HTN  -BP borderline controlled -continue amlodipine 5mg  daily, Lopressor 50mg  BID and Lisinopril 40mg  daily.  -titrate amlodipine if needed for more BP control  3.  Hyperlipidemia -LDL goal is less than 70.   -continue atorvastatin 80 mg daily.  For questions or updates, please contact [facility] Please consult [email or web address] for contact info under Cardiology/STEMI.      Signed, Armanda Magic, MD  02/20/2018, 12:25 PM

## 2018-02-20 NOTE — Progress Notes (Signed)
ANTICOAGULATION CONSULT NOTE - Initial Consult  Pharmacy Consult for Heparin Indication: chest pain/ACS  No Known Allergies  Patient Measurements: Height: 6\' 2"  (188 cm) Weight: (!) 390 lb (176.9 kg) IBW/kg (Calculated) : 82.2 Heparin Dosing Weight: 125 kg  Vital Signs: Temp: 98.3 F (36.8 C) (12/11 0012) Temp Source: Oral (12/11 0012) BP: 150/86 (12/11 0015) Pulse Rate: 65 (12/11 0015)  Labs: Recent Labs    02/19/18 2334 02/20/18 0040  HGB 13.2  --   HCT 42.1  --   PLT 315  --   CREATININE 0.87  --   TROPONINI  --  0.09*    Estimated Creatinine Clearance: 170.6 mL/min (by C-G formula based on SCr of 0.87 mg/dL).   Medical History: Past Medical History:  Diagnosis Date  . Arthritis   . Asthma   . 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  . Chest pain 03/18/2014  . Coronary artery disease    a. 2013: cath showing nonobstructive CAD with 50% OM, 20-30% dLAD, 30-40%dRCA  b. 12/2015: NSTEMI w/ 99% stenosis dLAD (DES placed), 99% stenosis 2nd Mrg (DES placed), apical LAD stenosis too small for PCI and mild disease in the RCA.   Marland Kitchen. DIABETES MELLITUS, TYPE II 10/17/2006  . ED (erectile dysfunction)   . GERD (gastroesophageal reflux disease)   . HYPERLIPIDEMIA 10/17/2006  . HYPERTENSION 10/17/2006  . NSTEMI (non-ST elevated myocardial infarction) (HCC) 12/16/2015  . OBESITY 10/17/2006  . Paroxysmal atrial fibrillation (HCC)    2006 - was on coumadin - took himself off 6 mos after initiation.  . Shortness of breath   . Sleep apnea    not using CPAP regularly    Medications:  Current Facility-Administered Medications on File Prior to Encounter  Medication Dose Route Frequency Provider Last Rate Last Dose  . ondansetron (ZOFRAN-ODT) disintegrating tablet 4 mg  4 mg Oral Once Roderick Peeodd, Jeffrey A, MD       Current Outpatient Medications on File Prior to Encounter  Medication Sig Dispense Refill  . albuterol (VENTOLIN HFA) 108 (90 Base) MCG/ACT inhaler Inhale 2 puffs into the lungs every 6 (six) hours  as needed. 1 Inhaler 1  . amLODipine (NORVASC) 5 MG tablet Take 1 tablet (5 mg total) by mouth daily. 30 tablet 10  . aspirin EC 81 MG tablet Take 1 tablet (81 mg total) by mouth daily. 90 tablet 3  . atorvastatin (LIPITOR) 80 MG tablet Take 1 tablet (80 mg total) by mouth daily at 6 PM. 90 tablet 3  . BOOSTRIX 5-2.5-18.5 injection     . clopidogrel (PLAVIX) 75 MG tablet Take 1 tablet (75 mg total) by mouth daily. 90 tablet 3  . ezetimibe (ZETIA) 10 MG tablet Take 1 tablet (10 mg total) by mouth daily. 30 tablet 11  . fluticasone (FLONASE) 50 MCG/ACT nasal spray Place 1 spray into both nostrils 2 (two) times daily. 16 g 0  . furosemide (LASIX) 40 MG tablet TAKE 1 TABLET BY MOUTH TWICE DAILY 60 tablet 5  . glipiZIDE (GLUCOTROL) 5 MG tablet TAKE 1 TABLET BY MOUTH TWICE DAILY BEFORE MEAL(S) 60 tablet 5  . isosorbide mononitrate (IMDUR) 30 MG 24 hr tablet TAKE 1 TABLET BY MOUTH ONCE DAILY 90 tablet 3  . lisinopril (PRINIVIL,ZESTRIL) 40 MG tablet Take 1 tablet (40 mg total) by mouth daily. 90 tablet 3  . metFORMIN (GLUCOPHAGE-XR) 750 MG 24 hr tablet Take 1 tablet (750 mg total) by mouth daily with breakfast. 30 tablet 11  . metoprolol tartrate (LOPRESSOR) 50 MG tablet Take  1 tablet (50 mg total) by mouth 2 (two) times daily. 180 tablet 3  . potassium chloride SA (K-DUR,KLOR-CON) 20 MEQ tablet TAKE 2 TABLETS BY MOUTH IN THE MORNING 200 tablet 5     Assessment: 52 y.o. male with chest pain for heparin  Goal of Therapy:  Heparin level 0.3-0.7 units/ml Monitor platelets by anticoagulation protocol: Yes   Plan:  Heparin 6000 units IV bolus, then start heparin 2000 units/hr Check heparin level in 6 hours.   Meaghan Whistler, Gary Fleet 02/20/2018,2:17 AM

## 2018-02-20 NOTE — ED Provider Notes (Addendum)
[facility] EMERGENCY DEPARTMENT Provider Note   CSN: [id number] Arrival date & time: 02/19/18  2315     History   Chief Complaint Chief Complaint  Patient presents with  . Chest Pain    HPI RONON FERGER is a 52 y.o. male.  HPI  52 year old male comes in with chief complaint of chest pain. Patient has known history of coronary artery disease with PCI placement on 10, 2017.  Patient also has history of hypertension, hyperlipidemia, diabetes and morbid obesity.  Patient reports that around 2 PM he had an episode of chest pain described as burning type pain that was generalized in nature.  Symptoms were unprovoked and resolved on their own.  After dinner he had repeat episode of similar pain.  Both pain were extremely intense and not similar to his GERD type pain.  Patient had associated shortness of breath and decided to come to the ER.  He is also complaining of right leg swelling and calf pain. Pt has no hx of PE, DVT and denies any exogenous hormone (testosterone / estrogen) use, long distance travels or surgery in the past 6 weeks, active cancer, recent immobilization.   Past Medical History:  Diagnosis Date  . Arthritis   . Asthma   . 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  . Chest pain 03/18/2014  . Coronary artery disease    a. 2013: cath showing nonobstructive CAD with 50% OM, 20-30% dLAD, 30-40%dRCA  b. 12/2015: NSTEMI w/ 99% stenosis dLAD (DES placed), 99% stenosis 2nd Mrg (DES placed), apical LAD stenosis too small for PCI and mild disease in the RCA.   Marland Kitchen DIABETES MELLITUS, TYPE II 10/17/2006  . ED (erectile dysfunction)   . GERD (gastroesophageal reflux disease)   . HYPERLIPIDEMIA 10/17/2006  . HYPERTENSION 10/17/2006  . NSTEMI (non-ST elevated myocardial infarction) (HCC) 12/16/2015  . OBESITY 10/17/2006  . Paroxysmal atrial fibrillation (HCC)    2006 - was on coumadin - took himself off 6 mos after initiation.  . Shortness of breath   . Sleep apnea    not using  CPAP regularly    Patient Active Problem List   Diagnosis Date Noted  . Viral URI with cough 04/02/2017  . Left flank pain 12/27/2016  . Kidney stone on left side 12/27/2016  . Epididymitis, left 12/27/2016  . OSA (obstructive sleep apnea) 12/18/2015  . NSTEMI (non-ST elevated myocardial infarction) (HCC) 12/16/2015  . Bilateral foot pain 07/16/2013  . Carotid bruit 05/17/2011  . CAD (coronary artery disease) 05/04/2011  . 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  . GERD (gastroesophageal reflux disease)   . KNEE PAIN, LEFT, ACUTE 12/27/2009  . DM II (diabetes mellitus, type II), controlled (HCC) 10/17/2006  . Hyperlipidemia 10/17/2006  . Morbid obesity (HCC) 10/17/2006  . Essential hypertension 10/17/2006  . ASTHMA, CHILDHOOD 10/17/2006    Past Surgical History:  Procedure Laterality Date  . arthroscopic knee    . CARDIAC CATHETERIZATION    . CARDIAC CATHETERIZATION N/A 12/17/2015   Procedure: Left Heart Cath and Coronary Angiography;  Surgeon: Kathleene Hazel, MD;  Location: [facility] INVASIVE CV LAB;  Service: Cardiovascular;  Laterality: N/A;  . CARDIAC CATHETERIZATION N/A 12/17/2015   Procedure: Coronary Stent Intervention;  Surgeon: Kathleene Hazel, MD;  Location: [facility] INVASIVE CV LAB;  Service: Cardiovascular;  Laterality: N/A;  . HERNIA REPAIR    . HERNIA REPAIR    . LEFT HEART CATHETERIZATION WITH CORONARY ANGIOGRAM N/A 05/03/2011   Procedure: LEFT HEART CATHETERIZATION WITH CORONARY ANGIOGRAM;  Surgeon: Jerolyn Center  Argentina Donovan, MD;  Location: [facility] CATH LAB;  Service: Cardiovascular;  Laterality: N/A;  . skin grafts          Home Medications    Prior to Admission medications   Medication Sig Start Date End Date Taking? Authorizing Provider  albuterol (VENTOLIN HFA) 108 (90 Base) MCG/ACT inhaler Inhale 2 puffs into the lungs every 6 (six) hours as needed. 04/06/17   [place], Betty G, MD  amLODipine (NORVASC) 5 MG tablet Take 1 tablet (5 mg total) by mouth daily. 09/14/17   Quintella Reichert, MD    aspirin EC 81 MG tablet Take 1 tablet (81 mg total) by mouth daily. 03/22/17   Quintella Reichert, MD  atorvastatin (LIPITOR) 80 MG tablet Take 1 tablet (80 mg total) by mouth daily at 6 PM. 09/24/17   Quintella Reichert, MD  BOOSTRIX 5-2.5-18.5 injection  09/10/15   [provider]  clopidogrel (PLAVIX) 75 MG tablet Take 1 tablet (75 mg total) by mouth daily. 03/22/17   Quintella Reichert, MD  ezetimibe (ZETIA) 10 MG tablet Take 1 tablet (10 mg total) by mouth daily. 09/04/17   Quintella Reichert, MD  fluticasone (FLONASE) 50 MCG/ACT nasal spray Place 1 spray into both nostrils 2 (two) times daily. 04/06/17   [place], Betty G, MD  furosemide (LASIX) 40 MG tablet TAKE 1 TABLET BY MOUTH TWICE DAILY 01/04/18   Roderick Pee, MD  glipiZIDE (GLUCOTROL) 5 MG tablet TAKE 1 TABLET BY MOUTH TWICE DAILY BEFORE MEAL(S) 01/04/18   Roderick Pee, MD  isosorbide mononitrate (IMDUR) 30 MG 24 hr tablet TAKE 1 TABLET BY MOUTH ONCE DAILY 08/31/17   Quintella Reichert, MD  lisinopril (PRINIVIL,ZESTRIL) 40 MG tablet Take 1 tablet (40 mg total) by mouth daily. 09/24/17   Quintella Reichert, MD  metFORMIN (GLUCOPHAGE-XR) 750 MG 24 hr tablet Take 1 tablet (750 mg total) by mouth daily with breakfast. 12/10/15   Nafziger, Kandee Keen, NP  metoprolol tartrate (LOPRESSOR) 50 MG tablet Take 1 tablet (50 mg total) by mouth 2 (two) times daily. 09/24/17   Quintella Reichert, MD  potassium chloride SA (K-DUR,KLOR-CON) 20 MEQ tablet TAKE 2 TABLETS BY MOUTH IN THE MORNING 07/16/17   Roderick Pee, MD    Family History Family History  Problem Relation Age of Onset  . Hypertension Brother   . Heart attack Sister        died mid 46's  . Heart attack Brother        died early 61's  . Cerebral aneurysm Mother        died mid 50's  . Lung cancer Father        died @ 32  . Diabetes Maternal Grandmother     Social History Social History   Tobacco Use  . Smoking status: Never Smoker  . Smokeless tobacco: Never Used  Substance Use Topics  .  Alcohol use: No  . Drug use: No     Allergies   Patient has no known allergies.   Review of Systems Review of Systems  Constitutional: Positive for activity change.  Respiratory: Positive for shortness of breath.   Cardiovascular: Positive for chest pain.  Musculoskeletal: Positive for arthralgias.  All other systems reviewed and are negative.    Physical Exam Updated Vital Signs BP (!) 150/86   Pulse 65   Temp 98.3 F (36.8 C) (Oral)   Resp 13   Ht 6\' 2"  (1.88 m)   Wt (!) 176.9  kg   SpO2 100%   BMI 50.07 kg/m   Physical Exam  Constitutional: He is oriented to person, place, and time. He appears well-developed.  HENT:  Head: Atraumatic.  Neck: Neck supple.  Cardiovascular: Normal rate.  Pulmonary/Chest: Effort normal.  Neurological: He is alert and oriented to person, place, and time.  Skin: Skin is warm.  Nursing note and vitals reviewed.    ED Treatments / Results  Labs (all labs ordered are listed, but only abnormal results are displayed) Labs Reviewed  TROPONIN I - Abnormal; Notable for the following components:      Result Value   Troponin I 0.09 (*)    All other components within normal limits  I-STAT TROPONIN, ED - Abnormal; Notable for the following components:   Troponin i, poc 0.12 (*)    All other components within normal limits  BASIC METABOLIC PANEL  CBC    EKG EKG Interpretation  Date/Time:  Tuesday February 19 2018 23:19:57 EST Ventricular Rate:  72 PR Interval:  198 QRS Duration: 108 QT Interval:  392 QTC Calculation: 429 R Axis:   4 Text Interpretation:  Normal sinus rhythm Normal ECG No acute changes No significant change since last tracing Confirmed by Derwood KaplanNanavati, Lavon Bothwell [phone]([id number]) on 02/20/2018 12:14:00 AM    EKG Interpretation  Date/Time:  Wednesday February 20 2018 00:10:44 EST Ventricular Rate:  67 PR Interval:  198 QRS Duration: 114 QT Interval:  396 QTC Calculation: 418 R Axis:   45 Text Interpretation:  Sinus rhythm  Incomplete right bundle branch block No acute changes No significant change since last tracing Confirmed by Derwood KaplanNanavati, Mitsuo Budnick [phone]([id number]) on 02/20/2018 2:13:51 AM        Radiology Dg Chest 2 View  Result Date: 02/19/2018 CLINICAL DATA:  Chest pain EXAM: CHEST - 2 VIEW COMPARISON:  12/16/2015 FINDINGS: The heart size and mediastinal contours are within normal limits. Both lungs are clear. Degenerative osteophytes of the spine. IMPRESSION: No active cardiopulmonary disease. Electronically Signed   By: Jasmine PangKim  Fujinaga M.D.   On: 02/19/2018 23:46   Ct Angio Chest Pe W And/or Wo Contrast  Result Date: 02/20/2018 CLINICAL DATA:  52 year old male with shortness of breath. Concern for pulmonary embolism. EXAM: CT ANGIOGRAPHY CHEST WITH CONTRAST TECHNIQUE: Multidetector CT imaging of the chest was performed using the standard protocol during bolus administration of intravenous contrast. Multiplanar CT image reconstructions and MIPs were obtained to evaluate the vascular anatomy. CONTRAST:  100mL ISOVUE-370 IOPAMIDOL (ISOVUE-370) INJECTION 76% COMPARISON:  Chest radiograph dated 02/19/2018 FINDINGS: Cardiovascular: There is mild cardiomegaly. Multi vessel coronary vascular calcification. No pericardial effusion. The thoracic aorta is unremarkable. Evaluation of the pulmonary arteries is limited due to suboptimal opacification and visualization of the peripheral branches. No large or central pulmonary artery embolus identified. Mediastinum/Nodes: There is no hilar or mediastinal adenopathy. Esophagus and the thyroid gland are grossly unremarkable. No mediastinal fluid collection. Lungs/Pleura: Minimal subpleural right lung base hazy nodularity, likely atelectatic changes. There is no consolidative changes. No pleural effusion or pneumothorax. The central airways are patent. Upper Abdomen: No acute abnormality. Musculoskeletal: Degenerative changes of the spine. No acute osseous pathology. Review of the MIP images  confirms the above findings. IMPRESSION: No acute intrathoracic pathology. No CT evidence of central pulmonary artery embolus. Electronically Signed   By: Elgie CollardArash  Radparvar M.D.   On: 02/20/2018 01:41    Procedures .Critical Care Performed by: Derwood KaplanNanavati, Deepa Barthel, MD Authorized by: Derwood KaplanNanavati, Mollie Rossano, MD   Critical care provider statement:    Critical care  time (minutes):  50   Critical care start time:  02/20/2018 12:18 AM   Critical care end time:  02/20/2018 2:18 AM   Critical care was time spent personally by me on the following activities:  Discussions with consultants, evaluation of patient's response to treatment, examination of patient, ordering and performing treatments and interventions, ordering and review of laboratory studies, ordering and review of radiographic studies, pulse oximetry, re-evaluation of patient's condition, obtaining history from patient or surrogate and review of old charts   (including critical care time)  Medications Ordered in ED Medications  iopamidol (ISOVUE-370) 76 % injection (has no administration in time range)  iopamidol (ISOVUE-370) 76 % injection 100 mL (100 mLs Intravenous Contrast Given 02/20/18 0106)     Initial Impression / Assessment and Plan / ED Course  I have reviewed the triage vital signs and the nursing notes.  Pertinent labs & imaging results that were available during my care of the patient were reviewed by me and considered in my medical decision making (see chart for details).  Clinical Course as of Feb 21 219  Wed Feb 20, 2018  0217 Results reviewed.  Patient does not have a large PE.  CT Angio Chest PE W and/or Wo Contrast [AN]  0217 Conventional troponin level is also slightly elevated.  We will start heparin.  Troponin I(!!): 0.09 [AN]  0217 Results from the ER workup discussed with the patient face to face and all questions answered to the best of my ability.  I spoke with cardiology who will admit the patient.   [AN]      Clinical Course User Index [AN] Derwood Kaplan, MD    52 year old male comes in with chief complaint of chest discomfort. He has known history of CAD status post PCI placed on October 2017.  He also has history of diabetes, hypertension, hyperlipidemia.  Patient had nonspecific chest discomfort described as burning type pain twice today.  With the second episode he had shortness of breath.  He has been compliant with his medication.    Patient's troponin is mildly elevated. On exam he is noted to have right lower extremity edema and calf tenderness.  No PE risk factors, however, pretest probability for PE is high clinically, and it would be prudent for [place] to rule out PE before cardiology is consulted.  CT PE ordered.  Ultrasound DVT for the right lower extremity also ordered in case CT study is negative.  Patient is having mild discomfort at this time. We will follow-up and monitor closely.  Initial EKG and repeat EKG did not have any signs of acute STEMI.  Final Clinical Impressions(s) / ED Diagnoses   Final diagnoses:  NSTEMI (non-ST elevated myocardial infarction) [facility])    ED Discharge Orders    None       Derwood Kaplan, MD 02/20/18 1610    Derwood Kaplan, MD 02/20/18 9604

## 2018-02-20 NOTE — ED Notes (Signed)
Pt back from US

## 2018-02-21 ENCOUNTER — Inpatient Hospital Stay (HOSPITAL_COMMUNITY): Payer: BLUE CROSS/BLUE SHIELD

## 2018-02-21 ENCOUNTER — Ambulatory Visit: Payer: BLUE CROSS/BLUE SHIELD | Admitting: Family Medicine

## 2018-02-21 ENCOUNTER — Encounter (HOSPITAL_COMMUNITY)
Admission: EM | Disposition: A | Discharge status: Home / Self Care | Payer: Self-pay | Source: Home / Self Care | Attending: Cardiology

## 2018-02-21 ENCOUNTER — Encounter (HOSPITAL_COMMUNITY): Payer: Self-pay | Admitting: General Practice

## 2018-02-21 DIAGNOSIS — I214 Non-ST elevation (NSTEMI) myocardial infarction: Secondary | ICD-10-CM

## 2018-02-21 HISTORY — PX: LEFT HEART CATH AND CORONARY ANGIOGRAPHY: CATH118249

## 2018-02-21 HISTORY — PX: CORONARY STENT INTERVENTION: CATH118234

## 2018-02-21 HISTORY — PX: CORONARY ANGIOPLASTY WITH STENT PLACEMENT: SHX49

## 2018-02-21 LAB — CBC
HCT: 40 % (ref 39.0–52.0)
Hemoglobin: 12.6 g/dL — ABNORMAL LOW (ref 13.0–17.0)
MCH: 26.4 pg (ref 26.0–34.0)
MCHC: 31.5 g/dL (ref 30.0–36.0)
MCV: 83.7 fL (ref 80.0–100.0)
Platelets: 284 10*3/uL (ref 150–400)
RBC: 4.78 MIL/uL (ref 4.22–5.81)
RDW: 14.4 % (ref 11.5–15.5)
WBC: 5.6 10*3/uL (ref 4.0–10.5)
nRBC: 0 % (ref 0.0–0.2)

## 2018-02-21 LAB — GLUCOSE, CAPILLARY
Glucose-Capillary: 116 mg/dL — ABNORMAL HIGH (ref 70–99)
Glucose-Capillary: 101 mg/dL — ABNORMAL HIGH (ref 70–99)
Glucose-Capillary: 100 mg/dL — ABNORMAL HIGH (ref 70–99)
Glucose-Capillary: 102 mg/dL — ABNORMAL HIGH (ref 70–99)
Glucose-Capillary: 80 mg/dL (ref 70–99)

## 2018-02-21 LAB — ECHOCARDIOGRAM COMPLETE
Height: 74 in
Weight: 5588.8 oz

## 2018-02-21 LAB — BASIC METABOLIC PANEL
Anion gap: 10 (ref 5–15)
BUN: 11 mg/dL (ref 6–20)
CO2: 24 mmol/L (ref 22–32)
Calcium: 8.8 mg/dL — ABNORMAL LOW (ref 8.9–10.3)
Chloride: 105 mmol/L (ref 98–111)
Creatinine, Ser: 0.89 mg/dL (ref 0.61–1.24)
GFR calc Af Amer: >60 mL/min (ref >60)
GFR calc non Af Amer: >60 mL/min (ref >60)
Glucose, Bld: 104 mg/dL — ABNORMAL HIGH (ref 70–99)
Potassium: 3.8 mmol/L (ref 3.5–5.1)
Sodium: 139 mmol/L (ref 135–145)

## 2018-02-21 LAB — POCT ACTIVATED CLOTTING TIME
Activated Clotting Time: 257 seconds
Activated Clotting Time: 263 seconds

## 2018-02-21 LAB — HEPARIN LEVEL (UNFRACTIONATED): Heparin Unfractionated: 0.83 IU/mL — ABNORMAL HIGH (ref 0.30–0.70)

## 2018-02-21 LAB — HIV ANTIBODY (ROUTINE TESTING W REFLEX): HIV Screen 4th Generation wRfx: NONREACTIVE

## 2018-02-21 SURGERY — LEFT HEART CATH AND CORONARY ANGIOGRAPHY
Anesthesia: LOCAL

## 2018-02-21 MED ORDER — HEPARIN SODIUM (PORCINE) 1000 UNIT/ML IJ SOLN
INTRAMUSCULAR | Status: AC
  Filled 2018-02-21: qty 1

## 2018-02-21 MED ORDER — HEPARIN SODIUM (PORCINE) 1000 UNIT/ML IJ SOLN
INTRAMUSCULAR | Status: DC | PRN
  Administered 2018-02-21: 7000 [IU] via INTRAVENOUS
  Administered 2018-02-21: 5000 [IU] via INTRAVENOUS
  Administered 2018-02-21: 3000 [IU] via INTRAVENOUS

## 2018-02-21 MED ORDER — VERAPAMIL HCL 2.5 MG/ML IV SOLN
INTRAVENOUS | Status: DC | PRN
  Administered 2018-02-21: 10 mL via INTRA_ARTERIAL

## 2018-02-21 MED ORDER — HEART ATTACK BOUNCING BOOK
Freq: Once | Status: AC
  Administered 2018-02-21: 1
  Filled 2018-02-21: qty 1

## 2018-02-21 MED ORDER — HYDRALAZINE HCL 20 MG/ML IJ SOLN: 5.0000 mg | INTRAMUSCULAR | Status: AC | PRN

## 2018-02-21 MED ORDER — SODIUM CHLORIDE 0.9% FLUSH: 3.0000 mL | INTRAVENOUS | Status: DC | PRN

## 2018-02-21 MED ORDER — PRASUGREL HCL 10 MG PO TABS
ORAL_TABLET | ORAL | Status: DC | PRN
  Administered 2018-02-21: 60 mg via ORAL

## 2018-02-21 MED ORDER — ANGIOPLASTY BOOK
Freq: Once | Status: AC
  Administered 2018-02-21: 15:00:00
  Filled 2018-02-21: qty 1

## 2018-02-21 MED ORDER — SODIUM CHLORIDE 0.9% FLUSH
3.0000 mL | Freq: Two times a day (BID) | INTRAVENOUS | Status: DC
  Administered 2018-02-22: 10:00:00 3 mL via INTRAVENOUS

## 2018-02-21 MED ORDER — LIDOCAINE HCL (PF) 1 % IJ SOLN
INTRAMUSCULAR | Status: AC
  Filled 2018-02-21: qty 30

## 2018-02-21 MED ORDER — MIDAZOLAM HCL 2 MG/2ML IJ SOLN
INTRAMUSCULAR | Status: DC | PRN
  Administered 2018-02-21: 1 mg via INTRAVENOUS

## 2018-02-21 MED ORDER — EZETIMIBE 10 MG PO TABS
10.0000 mg | ORAL_TABLET | Freq: Every day | ORAL | Status: DC
  Administered 2018-02-21 – 2018-02-22 (×2): 10 mg via ORAL
  Filled 2018-02-21 (×2): qty 1

## 2018-02-21 MED ORDER — CLOPIDOGREL BISULFATE 75 MG PO TABS: 75.0000 mg | ORAL_TABLET | Freq: Every day | ORAL | Status: DC

## 2018-02-21 MED ORDER — FENTANYL CITRATE (PF) 100 MCG/2ML IJ SOLN
INTRAMUSCULAR | Status: AC
  Filled 2018-02-21: qty 2

## 2018-02-21 MED ORDER — HEPARIN (PORCINE) IN NACL 1000-0.9 UT/500ML-% IV SOLN
INTRAVENOUS | Status: AC
  Filled 2018-02-21: qty 500

## 2018-02-21 MED ORDER — CLOPIDOGREL BISULFATE 75 MG PO TABS
300.0000 mg | ORAL_TABLET | Freq: Once | ORAL | Status: AC
  Administered 2018-02-22: 10:00:00 300 mg via ORAL
  Filled 2018-02-21: qty 4

## 2018-02-21 MED ORDER — VERAPAMIL HCL 2.5 MG/ML IV SOLN
INTRAVENOUS | Status: AC
  Filled 2018-02-21: qty 2

## 2018-02-21 MED ORDER — LABETALOL HCL 5 MG/ML IV SOLN: 10.0000 mg | INTRAVENOUS | Status: AC | PRN

## 2018-02-21 MED ORDER — LIDOCAINE HCL (PF) 1 % IJ SOLN
INTRAMUSCULAR | Status: DC | PRN
  Administered 2018-02-21: 2 mL

## 2018-02-21 MED ORDER — MIDAZOLAM HCL 2 MG/2ML IJ SOLN
INTRAMUSCULAR | Status: AC
  Filled 2018-02-21: qty 2

## 2018-02-21 MED ORDER — HEPARIN (PORCINE) IN NACL 1000-0.9 UT/500ML-% IV SOLN
INTRAVENOUS | Status: DC | PRN
  Administered 2018-02-21 (×2): 500 mL

## 2018-02-21 MED ORDER — ENOXAPARIN SODIUM 40 MG/0.4ML ~~LOC~~ SOLN
40.0000 mg | SUBCUTANEOUS | Status: DC
  Administered 2018-02-22: 40 mg via SUBCUTANEOUS
  Filled 2018-02-21: qty 0.4

## 2018-02-21 MED ORDER — FENTANYL CITRATE (PF) 100 MCG/2ML IJ SOLN
INTRAMUSCULAR | Status: DC | PRN
  Administered 2018-02-21: 25 ug via INTRAVENOUS

## 2018-02-21 MED ORDER — THE SENSUOUS HEART BOOK
Freq: Once | Status: AC
  Administered 2018-02-21: 1
  Filled 2018-02-21: qty 1

## 2018-02-21 MED ORDER — IOHEXOL 350 MG/ML SOLN
INTRAVENOUS | Status: DC | PRN
  Administered 2018-02-21: 125 mL

## 2018-02-21 MED ORDER — SODIUM CHLORIDE 0.9 % IV SOLN: 250.0000 mL | INTRAVENOUS | Status: DC | PRN

## 2018-02-21 MED ORDER — PRASUGREL HCL 10 MG PO TABS
ORAL_TABLET | ORAL | Status: AC
  Filled 2018-02-21: qty 6

## 2018-02-21 SURGICAL SUPPLY — 20 items
BALLN SAPPHIRE 2.25X10 (BALLOONS) ×2
BALLN ~~LOC~~ EUPHORA RX 2.75X8 (BALLOONS) ×2
BALLOON SAPPHIRE 2.25X10 (BALLOONS) IMPLANT
BALLOON ~~LOC~~ EUPHORA RX 2.75X8 (BALLOONS) IMPLANT
CATH LAUNCHER 6FR EBU3.5 (CATHETERS) ×1 IMPLANT
CATH OPTITORQUE TIG 4.0 5F (CATHETERS) ×1 IMPLANT
DEVICE RAD COMP TR BAND LRG (VASCULAR PRODUCTS) ×1 IMPLANT
GLIDESHEATH SLEND SS 6F .021 (SHEATH) ×1 IMPLANT
GUIDEWIRE INQWIRE 1.5J.035X260 (WIRE) IMPLANT
HOVERMATT SINGLE USE (MISCELLANEOUS) ×1 IMPLANT
INQWIRE 1.5J .035X260CM (WIRE) ×2
KIT ENCORE 26 ADVANTAGE (KITS) ×1 IMPLANT
KIT HEART LEFT (KITS) ×2 IMPLANT
PACK CARDIAC CATHETERIZATION (CUSTOM PROCEDURE TRAY) ×2 IMPLANT
PAD PRO RADIOLUCENT 2001M-C (PAD) ×1 IMPLANT
SHEATH PROBE COVER 6X72 (BAG) ×1 IMPLANT
STENT SYNERGY DES 2.5X12 (Permanent Stent) ×1 IMPLANT
TRANSDUCER W/STOPCOCK (MISCELLANEOUS) ×2 IMPLANT
TUBING CIL FLEX 10 FLL-RA (TUBING) ×2 IMPLANT
WIRE SION BLUE 180 (WIRE) ×1 IMPLANT

## 2018-02-21 NOTE — Interval H&P Note (Signed)
History and Physical Interval Note:  02/21/2018 11:32 AM  Raymond GlassmanPatrick D Lutz  has presented today for cardiac catheterization, with the diagnosis of NSTEMI. The various methods of treatment have been discussed with the patient and family. After consideration of risks, benefits and other options for treatment, the patient has consented to  Procedure(s): LEFT HEART CATH AND CORONARY ANGIOGRAPHY (N/A) as a surgical intervention .  The patient's history has been reviewed, patient examined, no change in status, stable for surgery.  I have reviewed the patient's chart and labs.  Questions were answered to the patient's satisfaction.    Cath Lab Visit (complete for each Cath Lab visit)  Clinical Evaluation Leading to the Procedure:   ACS: Yes.    Non-ACS:  N/A  Elnoria Livingston

## 2018-02-21 NOTE — Progress Notes (Signed)
  Echocardiogram 2D Echocardiogram has been performed.  Raymond PartridgeBrooke Lutz Raymond Lutz 02/21/2018, 11:16 AM

## 2018-02-21 NOTE — Progress Notes (Addendum)
ANTICOAGULATION CONSULT NOTE  Pharmacy Consult for Heparin Indication: chest pain/ACS  No Known Allergies  Patient Measurements: Height: 6\' 2"  (188 cm) Weight: (!) 349 lb 4.8 oz (158.4 kg) IBW/kg (Calculated) : 82.2 Heparin Dosing Weight: 125 kg  Vital Signs: Temp: 98 F (36.7 C) (12/11 2058) Temp Source: Oral (12/11 16102058) BP: 142/82 (12/11 2243) Pulse Rate: 68 (12/11 2243)  Labs: Recent Labs    02/19/18 2334 02/20/18 0040 02/20/18 0528 02/20/18 1223 02/21/18 0344  HGB 13.2  --   --   --  12.6*  HCT 42.1  --   --   --  40.0  PLT 315  --   --   --  284  HEPARINUNFRC  --   --   --  0.92* 0.83*  CREATININE 0.87  --   --   --  0.89  TROPONINI  --  0.09* 0.12*  --   --     Estimated Creatinine Clearance: 156.5 mL/min (by C-G formula based on SCr of 0.89 mg/dL).  Assessment: 52 y.o. male with chest pain for heparin- not on anticoag PTA.   Heparin level remains supratherapeutic at 0.83.  No s/sx of bleeding. No infusion issues.  Goal of Therapy:  Heparin level 0.3-0.7 units/ml Monitor platelets by anticoagulation protocol: Yes   Plan:  Decrease infusion to 1550 units/hr  F/u after cardiac cath Monitor daily HL, CBC, and for s/sx of bleeding  Thanks for allowing pharmacy to be a part of this patient's care.  Talbert CageLora Titania Gault, PharmD Clinical Pharmacist

## 2018-02-21 NOTE — Progress Notes (Signed)
[facility] BAND REMOVAL  LOCATION:    right radial  DEFLATED PER PROTOCOL:    Yes.    TIME BAND OFF / DRESSING APPLIED:    1700   SITE UPON ARRIVAL:    Level 0  SITE AFTER BAND REMOVAL:    Level 0  CIRCULATION SENSATION AND MOVEMENT:    Within Normal Limits   Yes.    COMMENTS:

## 2018-02-21 NOTE — Brief Op Note (Signed)
BRIEF CARDIAC CATHETERIZATION NOTE  DATE: 02/21/2018 TIME: 12:48 PM  PATIENT:  Raymond Lutz  52 y.o. male  PRE-OPERATIVE DIAGNOSIS:  NSTEMI  POST-OPERATIVE DIAGNOSIS:  NSTEMI  PROCEDURE:  Procedure(s): LEFT HEART CATH AND CORONARY ANGIOGRAPHY (N/A) CORONARY STENT INTERVENTION (N/A)  SURGEON:  Surgeon(s) and Role:    * Kimon Loewen, Cristal Deerhristopher, MD - Primary  FINDINGS: 1. Severe single-vessel coronary artery disease with 90% focal in-stent restenosis in the mid LCx. 2. Patent stent in the distal LAD with moderate LAD and RCA disease similar to prior catheterization. 3. Moderately elevated left ventricular filling pressure. 4. Successful PCI to mid LCx ISR using Synergy 2.5 x 12 mm DES (post-dilated to 2.8 mm) with 0% residual stenosis and TIMI-3 flow.  RECOMMENDATIONS: 1. DAPT with ASA and clopidogrel for at least 12 months. 2. Aggressive secondary prevention.  Yvonne Kendallhristopher Danyele Smejkal, MD [facility] Pager: [phone]

## 2018-02-21 NOTE — H&P (View-Only) (Signed)
 Progress Note  Patient Name: Raymond Lutz Date of Encounter: 02/21/2018  Primary Cardiologist: No primary care provider on file.   Subjective   Denies any further CP.  Plan for cath today  Inpatient Medications    Scheduled Meds: . amLODipine  5 mg Oral Daily  . aspirin EC  81 mg Oral Daily  . atorvastatin  80 mg Oral q1800  . insulin aspart  0-15 Units Subcutaneous TID WC  . insulin aspart  0-5 Units Subcutaneous QHS  . lisinopril  40 mg Oral Daily  . metoprolol tartrate  50 mg Oral BID  . sodium chloride flush  3 mL Intravenous Q12H   Continuous Infusions: . sodium chloride 250 mL (02/20/18 2016)  . sodium chloride    . heparin 1,550 Units/hr (02/21/18 0536)   PRN Meds: sodium chloride, acetaminophen, nitroGLYCERIN, ondansetron (ZOFRAN) IV, sodium chloride flush   Vital Signs    Vitals:   02/20/18 1616 02/20/18 2058 02/20/18 2243 02/21/18 0533  BP:  139/79 (!) 142/82 (!) 144/78  Pulse:  92 68   Resp:      Temp:  98 F (36.7 C)  98.1 F (36.7 C)  TempSrc:  Oral  Oral  SpO2:      Weight: (!) 158.4 kg     Height: 6' 2" (1.88 m)       Intake/Output Summary (Last 24 hours) at 02/21/2018 0830 Last data filed at 02/21/2018 0300 Gross per 24 hour  Intake 772.85 ml  Output 900 ml  Net -127.15 ml   Filed Weights   02/19/18 2324 02/20/18 1616  Weight: (!) 176.9 kg (!) 158.4 kg    Telemetry    NSR - Personally Reviewed  ECG    NSR with no ST changes - Personally Reviewed  Physical Exam   GEN: No acute distress.   Neck: No JVD Cardiac: RRR, no murmurs, rubs, or gallops.  Respiratory: Clear to auscultation bilaterally. GI: Soft, nontender, non-distended  MS: No edema; No deformity. Neuro:  Nonfocal  Psych: Normal affect   Labs    Chemistry Recent Labs  Lab 02/19/18 2334 02/21/18 0344  NA 140 139  K 4.0 3.8  CL 104 105  CO2 26 24  GLUCOSE 97 104*  BUN 16 11  CREATININE 0.87 0.89  CALCIUM 9.4 8.8*  GFRNONAA >60 >60  GFRAA >60 >60   ANIONGAP 10 10     Hematology Recent Labs  Lab 02/19/18 2334 02/21/18 0344  WBC 6.1 5.6  RBC 5.00 4.78  HGB 13.2 12.6*  HCT 42.1 40.0  MCV 84.2 83.7  MCH 26.4 26.4  MCHC 31.4 31.5  RDW 14.2 14.4  PLT 315 284    Cardiac Enzymes Recent Labs  Lab 02/20/18 0040 02/20/18 0528  TROPONINI 0.09* 0.12*    Recent Labs  Lab 02/19/18 2340  TROPIPOC 0.12*     BNPNo results for input(s): BNP, PROBNP in the last 168 hours.   DDimer No results for input(s): DDIMER in the last 168 hours.   Radiology    Dg Chest 2 View  Result Date: 02/19/2018 CLINICAL DATA:  Chest pain EXAM: CHEST - 2 VIEW COMPARISON:  12/16/2015 FINDINGS: The heart size and mediastinal contours are within normal limits. Both lungs are clear. Degenerative osteophytes of the spine. IMPRESSION: No active cardiopulmonary disease. Electronically Signed   By: Kim  Fujinaga M.D.   On: 02/19/2018 23:46   Ct Angio Chest Pe W And/or Wo Contrast  Result Date: 02/20/2018 CLINICAL DATA:  52-year-old   male with shortness of breath. Concern for pulmonary embolism. EXAM: CT ANGIOGRAPHY CHEST WITH CONTRAST TECHNIQUE: Multidetector CT imaging of the chest was performed using the standard protocol during bolus administration of intravenous contrast. Multiplanar CT image reconstructions and MIPs were obtained to evaluate the vascular anatomy. CONTRAST:  100mL ISOVUE-370 IOPAMIDOL (ISOVUE-370) INJECTION 76% COMPARISON:  Chest radiograph dated 02/19/2018 FINDINGS: Cardiovascular: There is mild cardiomegaly. Multi vessel coronary vascular calcification. No pericardial effusion. The thoracic aorta is unremarkable. Evaluation of the pulmonary arteries is limited due to suboptimal opacification and visualization of the peripheral branches. No large or central pulmonary artery embolus identified. Mediastinum/Nodes: There is no hilar or mediastinal adenopathy. Esophagus and the thyroid gland are grossly unremarkable. No mediastinal fluid  collection. Lungs/Pleura: Minimal subpleural right lung base hazy nodularity, likely atelectatic changes. There is no consolidative changes. No pleural effusion or pneumothorax. The central airways are patent. Upper Abdomen: No acute abnormality. Musculoskeletal: Degenerative changes of the spine. No acute osseous pathology. Review of the MIP images confirms the above findings. IMPRESSION: No acute intrathoracic pathology. No CT evidence of central pulmonary artery embolus. Electronically Signed   By: Arash  Radparvar M.D.   On: 02/20/2018 01:41   Vas [place] Lower Extremity Venous (dvt) (only Mc & Wl)  Result Date: 02/20/2018  Lower Venous Study Indications: Pain, and Swelling.  Risk Factors: Obesity. Mobidly greaer than 179 Kg. Performing Technologist: Virginia Slaughter RVS  Examination Guidelines: A complete evaluation includes B-mode imaging, spectral Doppler, color Doppler, and power Doppler as needed of all accessible portions of each vessel. Bilateral testing is considered an integral part of a complete examination. Limited examinations for reoccurring indications may be performed as noted.  Right Venous Findings: +---------+---------------+---------+-----------+----------+-------------------+          CompressibilityPhasicitySpontaneityPropertiesSummary             +---------+---------------+---------+-----------+----------+-------------------+ CFV      Full           Yes      Yes                                      +---------+---------------+---------+-----------+----------+-------------------+ SFJ      Full                                                             +---------+---------------+---------+-----------+----------+-------------------+ FV Prox  Full           Yes      Yes                                      +---------+---------------+---------+-----------+----------+-------------------+ FV Mid   Full                                                              +---------+---------------+---------+-----------+----------+-------------------+ FV DistalFull           Yes      Yes                                      +---------+---------------+---------+-----------+----------+-------------------+   PFV      Full           Yes      Yes                                      +---------+---------------+---------+-----------+----------+-------------------+ POP      Full           Yes      Yes                                      +---------+---------------+---------+-----------+----------+-------------------+ PTV      Full                                                             +---------+---------------+---------+-----------+----------+-------------------+ PERO                                                  Difficult to image                                                        due to body habitus +---------+---------------+---------+-----------+----------+-------------------+  Left Venous Findings: +---+---------------+---------+-----------+----------+-------+    CompressibilityPhasicitySpontaneityPropertiesSummary +---+---------------+---------+-----------+----------+-------+ CFVFull           Yes      Yes                          +---+---------------+---------+-----------+----------+-------+ SFJFull                                                 +---+---------------+---------+-----------+----------+-------+    Summary: Right: There is no evidence of deep vein thrombosis in the lower extremity. No cystic structure found in the popliteal fossa. Left: No evidence of a common femoral vein obstruction.  *See table(s) above for measurements and observations. Electronically signed by Todd Early MD on 02/20/2018 at 2:04:25 PM.    Final     Cardiac Studies   none  Patient Profile     52 y.o. male with a history of ASCAD with cath 12/17/2015 that revealed 99% distal LAD, 99% OM 2, and 30% distal RCA stenoses. He  underwent placement of DES to distal LAD and OM 2. Afterwards he was treated with aspirin and Brilinta. Admitted to ER with complaints of chest burning and increased SOB for over 30 minutes and trop was mildly elevated.   Assessment & Plan    1. Chest pain/NSTEMI -admitted with chest burning and elevated troponin -Sx similar to sx prior to PCI but burning more severe this time -trop mildly elevated (0.12>0.09>0.12) -Sx resolved with 1 SL NTG -plan for LHC today to redefine coronary anatomy and assess patency of stents -  continue ASA, IV Heparin gtt, statin and BB.  2.  ASCAD  - cath 12/17/2015 in the setting of NSTEMI showed 30% dRCA, 99% OM2, 99% distal LAD and underwent DES to the distal LAD and OM2 and he is on DPAT with ASA and Brilinta.  -There was an apical LAD stenosis which was too small for PCI along with mild nonobstructive disease in the RCA. -continue ASA 81mg daily, high dose statin and BB  3. HTN  -BP borderline controlled at 144/78mmHg -continue amlodipine 5mg daily, Lopressor 50mg BID and Lisinopril 40mg daily.  -titrate amlodipine if needed for more BP control  3. Hyperlipidemia -LDL goal is less than 70.   -continue atorvastatin 80 mg daily and Zetia 10mg daily -check FLP in am.  If LDL not at goal then need to consider PCSK9i  For questions or updates, please contact [facility] Please consult [email or web address] for contact info under Cardiology/STEMI.      Signed, Leonette Tischer, MD  02/21/2018, 8:30 AM   

## 2018-02-21 NOTE — Progress Notes (Signed)
Progress Note  Patient Name: Raymond Lutz Date of Encounter: 02/21/2018  Primary Cardiologist: No primary care provider on file.   Subjective   Denies any further CP.  Plan for cath today  Inpatient Medications    Scheduled Meds: . amLODipine  5 mg Oral Daily  . aspirin EC  81 mg Oral Daily  . atorvastatin  80 mg Oral q1800  . insulin aspart  0-15 Units Subcutaneous TID WC  . insulin aspart  0-5 Units Subcutaneous QHS  . lisinopril  40 mg Oral Daily  . metoprolol tartrate  50 mg Oral BID  . sodium chloride flush  3 mL Intravenous Q12H   Continuous Infusions: . sodium chloride 250 mL (02/20/18 2016)  . sodium chloride    . heparin 1,550 Units/hr (02/21/18 0536)   PRN Meds: sodium chloride, acetaminophen, nitroGLYCERIN, ondansetron (ZOFRAN) IV, sodium chloride flush   Vital Signs    Vitals:   02/20/18 1616 02/20/18 2058 02/20/18 2243 02/21/18 0533  BP:  139/79 (!) 142/82 (!) 144/78  Pulse:  92 68   Resp:      Temp:  98 F (36.7 C)  98.1 F (36.7 C)  TempSrc:  Oral  Oral  SpO2:      Weight: (!) 158.4 kg     Height: 6\' 2"  (1.88 m)       Intake/Output Summary (Last 24 hours) at 02/21/2018 0830 Last data filed at 02/21/2018 0300 Gross per 24 hour  Intake 772.85 ml  Output 900 ml  Net -127.15 ml   Filed Weights   02/19/18 2324 02/20/18 1616  Weight: (!) 176.9 kg (!) 158.4 kg    Telemetry    NSR - Personally Reviewed  ECG    NSR with no ST changes - Personally Reviewed  Physical Exam   GEN: No acute distress.   Neck: No JVD Cardiac: RRR, no murmurs, rubs, or gallops.  Respiratory: Clear to auscultation bilaterally. GI: Soft, nontender, non-distended  MS: No edema; No deformity. Neuro:  Nonfocal  Psych: Normal affect   Labs    Chemistry Recent Labs  Lab 02/19/18 2334 02/21/18 0344  NA 140 139  K 4.0 3.8  CL 104 105  CO2 26 24  GLUCOSE 97 104*  BUN 16 11  CREATININE 0.87 0.89  CALCIUM 9.4 8.8*  GFRNONAA >60 >60  GFRAA >60 >60   ANIONGAP 10 10     Hematology Recent Labs  Lab 02/19/18 2334 02/21/18 0344  WBC 6.1 5.6  RBC 5.00 4.78  HGB 13.2 12.6*  HCT 42.1 40.0  MCV 84.2 83.7  MCH 26.4 26.4  MCHC 31.4 31.5  RDW 14.2 14.4  PLT 315 284    Cardiac Enzymes Recent Labs  Lab 02/20/18 0040 02/20/18 0528  TROPONINI 0.09* 0.12*    Recent Labs  Lab 02/19/18 2340  TROPIPOC 0.12*     BNPNo results for input(s): BNP, PROBNP in the last 168 hours.   DDimer No results for input(s): DDIMER in the last 168 hours.   Radiology    Dg Chest 2 View  Result Date: 02/19/2018 CLINICAL DATA:  Chest pain EXAM: CHEST - 2 VIEW COMPARISON:  12/16/2015 FINDINGS: The heart size and mediastinal contours are within normal limits. Both lungs are clear. Degenerative osteophytes of the spine. IMPRESSION: No active cardiopulmonary disease. Electronically Signed   By: Jasmine Pang M.D.   On: 02/19/2018 23:46   Ct Angio Chest Pe W And/or Wo Contrast  Result Date: 02/20/2018 CLINICAL DATA:  52 year old  male with shortness of breath. Concern for pulmonary embolism. EXAM: CT ANGIOGRAPHY CHEST WITH CONTRAST TECHNIQUE: Multidetector CT imaging of the chest was performed using the standard protocol during bolus administration of intravenous contrast. Multiplanar CT image reconstructions and MIPs were obtained to evaluate the vascular anatomy. CONTRAST:  ISOVUE-370 IOPAMIDOL (ISOVUE-370) INJECTION 76% COMPARISON:  Chest radiograph dated 02/19/2018 FINDINGS: Cardiovascular: There is mild cardiomegaly. Multi vessel coronary vascular calcification. No pericardial effusion. The thoracic aorta is unremarkable. Evaluation of the pulmonary arteries is limited due to suboptimal opacification and visualization of the peripheral branches. No large or central pulmonary artery embolus identified. Mediastinum/Nodes: There is no hilar or mediastinal adenopathy. Esophagus and the thyroid gland are grossly unremarkable. No mediastinal fluid  collection. Lungs/Pleura: Minimal subpleural right lung base hazy nodularity, likely atelectatic changes. There is no consolidative changes. No pleural effusion or pneumothorax. The central airways are patent. Upper Abdomen: No acute abnormality. Musculoskeletal: Degenerative changes of the spine. No acute osseous pathology. Review of the MIP images confirms the above findings. IMPRESSION: No acute intrathoracic pathology. No CT evidence of central pulmonary artery embolus. Electronically Signed   By: Elgie Collard M.D.   On: 02/20/2018 01:41   Vas [place] Lower Extremity Venous (dvt) (only Mc & Wl)  Result Date: 02/20/2018  Lower Venous Study Indications: Pain, and Swelling.  Risk Factors: Obesity. Mobidly greaer than 179 Kg. Performing Technologist: Toma Deiters RVS  Examination Guidelines: A complete evaluation includes B-mode imaging, spectral Doppler, color Doppler, and power Doppler as needed of all accessible portions of each vessel. Bilateral testing is considered an integral part of a complete examination. Limited examinations for reoccurring indications may be performed as noted.  Right Venous Findings: +---------+---------------+---------+-----------+----------+-------------------+          CompressibilityPhasicitySpontaneityPropertiesSummary             +---------+---------------+---------+-----------+----------+-------------------+ CFV      Full           Yes      Yes                                      +---------+---------------+---------+-----------+----------+-------------------+ SFJ      Full                                                             +---------+---------------+---------+-----------+----------+-------------------+ FV Prox  Full           Yes      Yes                                      +---------+---------------+---------+-----------+----------+-------------------+ FV Mid   Full                                                              +---------+---------------+---------+-----------+----------+-------------------+ FV DistalFull           Yes      Yes                                      +---------+---------------+---------+-----------+----------+-------------------+  PFV      Full           Yes      Yes                                      +---------+---------------+---------+-----------+----------+-------------------+ POP      Full           Yes      Yes                                      +---------+---------------+---------+-----------+----------+-------------------+ PTV      Full                                                             +---------+---------------+---------+-----------+----------+-------------------+ PERO                                                  Difficult to image                                                        due to body habitus +---------+---------------+---------+-----------+----------+-------------------+  Left Venous Findings: +---+---------------+---------+-----------+----------+-------+    CompressibilityPhasicitySpontaneityPropertiesSummary +---+---------------+---------+-----------+----------+-------+ CFVFull           Yes      Yes                          +---+---------------+---------+-----------+----------+-------+ SFJFull                                                 +---+---------------+---------+-----------+----------+-------+    Summary: Right: There is no evidence of deep vein thrombosis in the lower extremity. No cystic structure found in the popliteal fossa. Left: No evidence of a common femoral vein obstruction.  *See table(s) above for measurements and observations. Electronically signed by Gretta Beganodd Early MD on 02/20/2018 at 2:04:25 PM.    Final     Cardiac Studies   none  Patient Profile     52 y.o. male with a history of ASCAD with cath 12/17/2015 that revealed 99% distal LAD, 99% OM 2, and 30% distal RCA stenoses. He  underwent placement of DES to distal LAD and OM 2. Afterwards he was treated with aspirin and Brilinta. Admitted to ER with complaints of chest burning and increased SOB for over 30 minutes and trop was mildly elevated.   Assessment & Plan    1. Chest pain/NSTEMI -admitted with chest burning and elevated troponin -Sx similar to sx prior to PCI but burning more severe this time -trop mildly elevated (0.12>0.09>0.12) -Sx resolved with 1 SL NTG -plan for LHC today to redefine coronary anatomy and assess patency of stents -  continue ASA, IV Heparin gtt, statin and BB.  2.  ASCAD  - cath 12/17/2015 in the setting of NSTEMI showed 30% dRCA, 99% OM2, 99% distal LAD and underwent DES to the distal LAD and OM2 and he is on DPAT with ASA and Brilinta.  -There was an apical LAD stenosis which was too small for PCI along with mild nonobstructive disease in the RCA. -continue ASA 81mg  daily, high dose statin and BB  3. HTN  -BP borderline controlled at 144/29mmHg -continue amlodipine 5mg  daily, Lopressor 50mg  BID and Lisinopril 40mg  daily.  -titrate amlodipine if needed for more BP control  3. Hyperlipidemia -LDL goal is less than 70.   -continue atorvastatin 80 mg daily and Zetia 10mg  daily -check FLP in am.  If LDL not at goal then need to consider PCSK9i  For questions or updates, please contact [facility] Please consult [email or web address] for contact info under Cardiology/STEMI.      Signed, Armanda Magic, MD  02/21/2018, 8:30 AM

## 2018-02-22 ENCOUNTER — Encounter (HOSPITAL_COMMUNITY): Payer: Self-pay | Admitting: Internal Medicine

## 2018-02-22 LAB — BASIC METABOLIC PANEL
Anion gap: 11 (ref 5–15)
BUN: 13 mg/dL (ref 6–20)
CO2: 22 mmol/L (ref 22–32)
Calcium: 8.9 mg/dL (ref 8.9–10.3)
Chloride: 107 mmol/L (ref 98–111)
Creatinine, Ser: 0.94 mg/dL (ref 0.61–1.24)
GFR calc Af Amer: >60 mL/min (ref >60)
GFR calc non Af Amer: >60 mL/min (ref >60)
Glucose, Bld: 113 mg/dL — ABNORMAL HIGH (ref 70–99)
Potassium: 3.9 mmol/L (ref 3.5–5.1)
Sodium: 140 mmol/L (ref 135–145)

## 2018-02-22 LAB — CBC
HCT: 43.1 % (ref 39.0–52.0)
Hemoglobin: 13.3 g/dL (ref 13.0–17.0)
MCH: 26.1 pg (ref 26.0–34.0)
MCHC: 30.9 g/dL (ref 30.0–36.0)
MCV: 84.5 fL (ref 80.0–100.0)
Platelets: 300 10*3/uL (ref 150–400)
RBC: 5.1 MIL/uL (ref 4.22–5.81)
RDW: 14.2 % (ref 11.5–15.5)
WBC: 5 10*3/uL (ref 4.0–10.5)
nRBC: 0 % (ref 0.0–0.2)

## 2018-02-22 LAB — GLUCOSE, CAPILLARY
Glucose-Capillary: 108 mg/dL — ABNORMAL HIGH (ref 70–99)
Glucose-Capillary: 88 mg/dL (ref 70–99)

## 2018-02-22 LAB — LIPID PANEL
Cholesterol: 113 mg/dL (ref 0–200)
HDL: 30 mg/dL — ABNORMAL LOW (ref >40)
LDL Cholesterol: 75 mg/dL (ref 0–99)
Total CHOL/HDL Ratio: 3.8 RATIO
Triglycerides: 41 mg/dL (ref <150)
VLDL: 8 mg/dL (ref 0–40)

## 2018-02-22 MED ORDER — CLOPIDOGREL BISULFATE 75 MG PO TABS: 75.0000 mg | ORAL_TABLET | Freq: Every day | ORAL | 3 refills | Status: DC

## 2018-02-22 MED ORDER — NITROGLYCERIN 0.4 MG SL SUBL: 0.4000 mg | SUBLINGUAL_TABLET | SUBLINGUAL | 12 refills | Status: DC | PRN

## 2018-02-22 NOTE — Progress Notes (Signed)
Progress Note  Patient Name: Raymond Lutz Date of Encounter: 02/22/2018  Primary Cardiologist: No primary care provider on file.   Subjective   Denies any chest pain or SOB.  Cath yesterday with severe 90% focal in stent restenosis of the mid LCx stent and patent stent in the LAD with mild to moderate LAD/RCA dz (25-30%) unchanged from prior cath. Residual 90% distal LAD too small for PCI.  Elevated LVEDP.    Inpatient Medications    Scheduled Meds: . amLODipine  5 mg Oral Daily  . aspirin EC  81 mg Oral Daily  . atorvastatin  80 mg Oral q1800  . clopidogrel  300 mg Oral Once  . [START ON 02/23/2018] clopidogrel  75 mg Oral Q breakfast  . enoxaparin (LOVENOX) injection  40 mg Subcutaneous Q24H  . ezetimibe  10 mg Oral Daily  . insulin aspart  0-15 Units Subcutaneous TID WC  . insulin aspart  0-5 Units Subcutaneous QHS  . lisinopril  40 mg Oral Daily  . metoprolol tartrate  50 mg Oral BID  . sodium chloride flush  3 mL Intravenous Q12H   Continuous Infusions: . sodium chloride     PRN Meds: sodium chloride, acetaminophen, nitroGLYCERIN, ondansetron (ZOFRAN) IV, sodium chloride flush   Vital Signs    Vitals:   02/21/18 1930 02/21/18 2034 02/22/18 0622 02/22/18 0758  BP:  (!) 133/102 128/73 (!) 160/78  Pulse:  64 (!) 55 66  Resp: 12 11 10 13   Temp:  98.1 F (36.7 C) 97.8 F (36.6 C) 97.6 F (36.4 C)  TempSrc:  Oral Oral Oral  SpO2: 99% 99% 99% 98%  Weight:   (!) 159 kg   Height:        Intake/Output Summary (Last 24 hours) at 02/22/2018 [id number] Last data filed at 02/22/2018 0800 Gross per 24 hour  Intake 720 ml  Output 1325 ml  Net -605 ml   Filed Weights   02/19/18 2324 02/20/18 1616 02/22/18 0622  Weight: (!) 176.9 kg (!) 158.4 kg (!) 159 kg    Telemetry    NSR - Personally Reviewed  ECG    NSR with IRBBB - Personally Reviewed  Physical Exam   GEN: No acute distress.   Neck: No JVD Cardiac: RRR, no murmurs, rubs, or gallops.  Respiratory:  Clear to auscultation bilaterally. GI: Soft, nontender, non-distended  MS: No edema; No deformity. Neuro:  Nonfocal  Psych: Normal affect   Labs    Chemistry Recent Labs  Lab 02/19/18 2334 02/21/18 0344  NA 140 139  K 4.0 3.8  CL 104 105  CO2 26 24  GLUCOSE 97 104*  BUN 16 11  CREATININE 0.87 0.89  CALCIUM 9.4 8.8*  GFRNONAA >60 >60  GFRAA >60 >60  ANIONGAP 10 10     Hematology Recent Labs  Lab 02/19/18 2334 02/21/18 0344 02/22/18 0456  WBC 6.1 5.6 5.0  RBC 5.00 4.78 5.10  HGB 13.2 12.6* 13.3  HCT 42.1 40.0 43.1  MCV 84.2 83.7 84.5  MCH 26.4 26.4 26.1  MCHC 31.4 31.5 30.9  RDW 14.2 14.4 14.2  PLT 315 284 300    Cardiac Enzymes Recent Labs  Lab 02/20/18 0040 02/20/18 0528  TROPONINI 0.09* 0.12*    Recent Labs  Lab 02/19/18 2340  TROPIPOC 0.12*     BNPNo results for input(s): BNP, PROBNP in the last 168 hours.   DDimer No results for input(s): DDIMER in the last 168 hours.   Radiology  Vas [place] Lower Extremity Venous (dvt) (only [facility])  Result Date: 02/20/2018  Lower Venous Study Indications: Pain, and Swelling.  Risk Factors: Obesity. Mobidly greaer than 179 Kg. Performing Technologist: Toma Deiters RVS  Examination Guidelines: A complete evaluation includes B-mode imaging, spectral Doppler, color Doppler, and power Doppler as needed of all accessible portions of each vessel. Bilateral testing is considered an integral part of a complete examination. Limited examinations for reoccurring indications may be performed as noted.  Right Venous Findings: +---------+---------------+---------+-----------+----------+-------------------+          CompressibilityPhasicitySpontaneityPropertiesSummary             +---------+---------------+---------+-----------+----------+-------------------+ CFV      Full           Yes      Yes                                      +---------+---------------+---------+-----------+----------+-------------------+  SFJ      Full                                                             +---------+---------------+---------+-----------+----------+-------------------+ FV Prox  Full           Yes      Yes                                      +---------+---------------+---------+-----------+----------+-------------------+ FV Mid   Full                                                             +---------+---------------+---------+-----------+----------+-------------------+ FV DistalFull           Yes      Yes                                      +---------+---------------+---------+-----------+----------+-------------------+ PFV      Full           Yes      Yes                                      +---------+---------------+---------+-----------+----------+-------------------+ POP      Full           Yes      Yes                                      +---------+---------------+---------+-----------+----------+-------------------+ PTV      Full                                                             +---------+---------------+---------+-----------+----------+-------------------+  PERO                                                  Difficult to image                                                        due to body habitus +---------+---------------+---------+-----------+----------+-------------------+  Left Venous Findings: +---+---------------+---------+-----------+----------+-------+    CompressibilityPhasicitySpontaneityPropertiesSummary +---+---------------+---------+-----------+----------+-------+ CFVFull           Yes      Yes                          +---+---------------+---------+-----------+----------+-------+ SFJFull                                                 +---+---------------+---------+-----------+----------+-------+    Summary: Right: There is no evidence of deep vein thrombosis in the lower extremity. No cystic structure  found in the popliteal fossa. Left: No evidence of a common femoral vein obstruction.  *See table(s) above for measurements and observations. Electronically signed by Gretta Began MD on 02/20/2018 at 2:04:25 PM.    Final     Cardiac Studies   Cardiac cath 02/21/2018 Conclusions: 1. Severe single-vessel coronary artery disease with 90% focal in-stent restenosis in the mid LCx. 2. Patent stent in the distal LAD with moderate LAD and RCA disease similar to prior catheterization. 3. Moderately elevated left ventricular filling pressure. 4. Successful PCI to mid LCx ISR using Synergy 2.5 x 12 mm DES (post-dilated to 2.8 mm) with 0% residual stenosis and TIMI-3 flow.  Recommendations: 1. Dual antiplatelet therapy with aspirin and clopidogrel for at least 12 months. 2. Aggressive secondary prevention.  2D echo 02/21/2018 Study Conclusions  - Left ventricle: The cavity size was normal. Wall thickness was   increased in a pattern of moderate LVH. Systolic function was   normal. The estimated ejection fraction was in the range of 55%   to 60%. Mild lateral hypokinesis. Left ventricular diastolic   function parameters were normal. - Left atrium: Moderately dilated. - Right atrium: The atrium was at the upper limits of normal in   size. - Inferior vena cava: The vessel was dilated. The respirophasic   diameter changes were blunted (< 50%), consistent with elevated   central venous pressure.  Impressions:  - Compared to a prior study in 2017, the LVEF is slightly lower,   but normal at 55-60%. There may be mild lateral hypokinesis,   however, diastolic function appears normal (lateral e&' velocity   of 15 cm/s). The IVC is dilated with moderate LAE and upper   normal RA size.    Patient Profile     52 y.o. male  with a history of ASCAD with cath10/08/2015 that revealed 99% distal LAD, 99% OM 2, and 30% distal RCA stenoses. He underwent placement of DES to distal LAD and OM 2.  Afterwards he was treated with aspirin and Brilinta.Admitted to ER with complaints of chest burning and increased SOB for over 30 minutes  and trop was mildly elevated.  Assessment & Plan    1.Chest pain/NSTEMI -admitted with chest burning and elevated troponin -Sx similar to sx prior to PCI but burning more severe this time -trop mildly elevated (0.12>0.09>0.12) -Sx resolved with 1 SL NTG -Cath yesterday showed severe 90% focal in stent restenosis of the mid LCx stent and patent stent in the LAD with mild to moderate LAD/RCA dz (25-30%) unchanged from prior cath. Residual 90% distal LAD too small for PCI.  Elevated LVEDP.  S/P PCI of the LCx -continue ASA, Plavix 75mg  daily, high dose statin and BB. -BMET pending  2.ASCAD -cath 12/17/2015 in the setting of NSTEMI showed 30% dRCA, 99% OM2, 99% distal LAD and underwent DES to the distal LAD and OM2 and he is on DPAT with ASA and Brilinta.  -There was an apical LAD stenosis which was too small for PCI along with mild nonobstructive disease in the RCA. -continue ASA 81mg  daily, high dose statin and BB  3. HTN -BP borderline 128/73 to 160/39mmHg this am but has not had meds -continue amlodipine 5mg  daily, Lopressor 50mg  BID and Lisinopril 40mg  daily.  -titrate amlodipine if needed for more BP control as outpt  3. Hyperlipidemia -LDL goal is less than 70. -continueatorvastatin 80 mg daily and Zetia 10mg  daily -FLP pending this am.  If LDL not at goal then need to consider PCSK9i  He is stable this am and ready for discharge if BMET stable.  Plan for TOC followup in 7-10 days.    For questions or updates, please contact [facility] Please consult [email or web address] for contact info under Cardiology/STEMI.      Signed, Armanda Magic, MD  02/22/2018, 8:22 AM

## 2018-02-22 NOTE — Progress Notes (Signed)
CARDIAC REHAB PHASE I   PRE:  Rate/Rhythm: 63 SR  BP:  Supine:   Sitting: 157/80  Standing:    SaO2:   MODE:  Ambulation: 450 ft   POST:  Rate/Rhythm: 85 SR  BP:  Supine:   Sitting: 160/79  Standing:    SaO2:  0905-1010 Pt walked 450 ft on RA with steady gait and no CP. Left ankle bothering him some this morning.  This is not new. Discussed MI restrictions, NTG use, ex ed, gave heart healthy and diabetic diets, plavix importance and CRP 2. Referred to [place] program. Pt voiced understanding.   Luetta Nuttingharlene Darielle Hancher, RN BSN  02/22/2018 10:07 AM

## 2018-02-22 NOTE — Discharge Summary (Signed)
Discharge Summary    Patient ID: Raymond Lutz MRN: [id number]; DOB: [birth year]  Admit date: 02/19/2018 Discharge date: 02/22/2018  Primary Care Provider: Nelwyn Salisbury, MD  Primary Cardiologist: Armanda Magic, MD  Primary Electrophysiologist:  None   Discharge Diagnoses    Principal Problem:   NSTEMI (non-ST elevated myocardial infarction) [facility]) Active Problems:   DM II (diabetes mellitus, type II), controlled (HCC)   Hyperlipidemia   Essential hypertension   CAD (coronary artery disease)   Allergies No Known Allergies  Diagnostic Studies/Procedures    LEFT HEART CATH AND CORONARY ANGIOGRAPHY 02/21/18   Conclusions: 1. Severe single-vessel coronary artery disease with 90% focal in-stent restenosis in the mid LCx. 2. Patent stent in the distal LAD with moderate LAD and RCA disease similar to prior catheterization. 3. Moderately elevated left ventricular filling pressure. 4. Successful PCI to mid LCx ISR using Synergy 2.5 x 12 mm DES (post-dilated to 2.8 mm) with 0% residual stenosis and TIMI-3 flow.  Recommendations: 1. Dual antiplatelet therapy with aspirin and clopidogrel for at least 12 months. 2. Aggressive secondary prevention.  Yvonne Kendall, MD   Diagnostic  Dominance: Right    Intervention      _____________  Echocardiogram 02/21/18 Study Conclusions - Left ventricle: The cavity size was normal. Wall thickness was   increased in a pattern of moderate LVH. Systolic function was   normal. The estimated ejection fraction was in the range of 55%   to 60%. Mild lateral hypokinesis. Left ventricular diastolic   function parameters were normal. - Left atrium: Moderately dilated. - Right atrium: The atrium was at the upper limits of normal in   size. - Inferior vena cava: The vessel was dilated. The respirophasic   diameter changes were blunted (< 50%), consistent with elevated   central venous pressure.  Impressions: - Compared to a prior  study in 2017, the LVEF is slightly lower,   but normal at 55-60%. There may be mild lateral hypokinesis,   however, diastolic function appears normal (lateral e&' velocity   of 15 cm/s). The IVC is dilated with moderate LAE and upper   normal RA size.    History of Present Illness     Raymond Lutz is a 52 y.o. male with a history of coronary artery disease status post PCI of the distal LAD and OM 2 in 2017, morbid obesity, obstructive sleep apnea (not on CPAP) type 2 diabetes mellitus, hyperlipidemia, hypertension, peripheral vascular disease and paroxysmal atrial fibrillation (not on anticoagulation) who presented to the hospital 02/20/18 with complaints of substernal chest pain.  The patient states that on the previous day he had chest heaviness at around 2 in the afternoon.  He then had recurrent pain at around 9 PM that lasted for approximately 40 minutes.  He had some expired nitroglycerin at home that he took without any relief.  He denied any associated shortness of breath, diaphoresis, nausea or vomiting.  He has been noticing increased right lower extremity edema.  He denies any fevers or chills.  He has been compliant with all of his medications.  Prior to this he had been doing relatively well and was fairly active without any provocation of chest pain or shortness of breath on exertion.  His last cardiac catheterization was on 12/17/2015 that revealed 99% distal LAD, 99% OM 2, and 30% distal RCA stenoses.  He underwent placement of DES to distal LAD and OM 2.  Afterwards he was treated with aspirin and Brilinta.  On  subsequent clinic visits for complaints of residual anginal pain Imdur was added.  In the ED his ECG revealed normal sinus rhythm, rate 67 bpm and an incomplete right bundle branch block.  The troponin was mildly elevated at 0.09.  A CT pulmonary angiogram was negative for a PE (given his right lower extremity edema at presentation).   Hospital Course       Consultants: none   Symptoms were similar to prior to his last PCI, resolved with NTG. Troponins stayed mildly elevated (0.12>0.09>0.12). He was take to the cath lab on 02/21/18 and found to have severe 90% focal in stent restenosis of the mid LCx stent and patent stent in the LAD with mild to moderate LAD/RCA dz (25-30%) unchanged from prior cath. Residual 90% distal LAD too small for PCI.  Elevated LVEDP. Successful PCI to mid LCx ISR using Synergy 2.5 x 12 mm DES (post-dilated to 2.8 mm) with 0% residual stenosis and TIMI-3 flow.   He was monitored overnight and has done well. His right radial cath site is stable.  He will continue on DAPT with clopidogrel and aspirin as well as BB and high intensity statin (also on zetia). LDL goal <70. If not to goal, recommend referral to lipid clinic for consideration of PCSK9 inhibitor.   BP is mildly elevated. Resume home meds and recheck at follow up. Diabetes was managed with SSI. Metformin held for procedure and can restart 48 hours post procedure.   SCr remained stable 0.94, K+ 3.9.  Hemoglobin is stable at 13.3.  Patient has been seen by Dr. Mayford Knife today and deemed ready for discharge home. All follow up appointments have been scheduled. Discharge medications are listed below. _____________  Discharge Vitals Blood pressure (!) 160/79, pulse 69, temperature 97.6 F (36.4 C), temperature source Oral, resp. rate 13, height 6\' 2"  (1.88 m), weight (!) 159 kg, SpO2 98 %.  Filed Weights   02/19/18 2324 02/20/18 1616 02/22/18 0622  Weight: (!) 176.9 kg (!) 158.4 kg (!) 159 kg    Labs & Radiologic Studies    CBC Recent Labs    02/21/18 0344 02/22/18 0456  WBC 5.6 5.0  HGB 12.6* 13.3  HCT 40.0 43.1  MCV 83.7 84.5  PLT 284 300   Basic Metabolic Panel Recent Labs    16/10/96 0344 02/22/18 0456  NA 139 140  K 3.8 3.9  CL 105 107  CO2 24 22  GLUCOSE 104* 113*  BUN 11 13  CREATININE 0.89 0.94  CALCIUM 8.8* 8.9   Liver Function  Tests No results for input(s): AST, ALT, ALKPHOS, BILITOT, PROT, ALBUMIN in the last 72 hours. No results for input(s): LIPASE, AMYLASE in the last 72 hours. Cardiac Enzymes Recent Labs    02/20/18 0040 02/20/18 0528  TROPONINI 0.09* 0.12*   BNP Invalid input(s): POCBNP D-Dimer No results for input(s): DDIMER in the last 72 hours. Hemoglobin A1C No results for input(s): HGBA1C in the last 72 hours. Fasting Lipid Panel Recent Labs    02/22/18 0456  CHOL 113  HDL 30*  LDLCALC 75  TRIG 41  CHOLHDL 3.8   Thyroid Function Tests No results for input(s): TSH, T4TOTAL, T3FREE, THYROIDAB in the last 72 hours.  Invalid input(s): FREET3 _____________  Dg Chest 2 View  Result Date: 02/19/2018 CLINICAL DATA:  Chest pain EXAM: CHEST - 2 VIEW COMPARISON:  12/16/2015 FINDINGS: The heart size and mediastinal contours are within normal limits. Both lungs are clear. Degenerative osteophytes of the spine. IMPRESSION: No  active cardiopulmonary disease. Electronically Signed   By: Jasmine PangKim  Fujinaga M.D.   On: 02/19/2018 23:46   Ct Angio Chest Pe W And/or Wo Contrast  Result Date: 02/20/2018 CLINICAL DATA:  52 year old male with shortness of breath. Concern for pulmonary embolism. EXAM: CT ANGIOGRAPHY CHEST WITH CONTRAST TECHNIQUE: Multidetector CT imaging of the chest was performed using the standard protocol during bolus administration of intravenous contrast. Multiplanar CT image reconstructions and MIPs were obtained to evaluate the vascular anatomy. CONTRAST:  100mL ISOVUE-370 IOPAMIDOL (ISOVUE-370) INJECTION 76% COMPARISON:  Chest radiograph dated 02/19/2018 FINDINGS: Cardiovascular: There is mild cardiomegaly. Multi vessel coronary vascular calcification. No pericardial effusion. The thoracic aorta is unremarkable. Evaluation of the pulmonary arteries is limited due to suboptimal opacification and visualization of the peripheral branches. No large or central pulmonary artery embolus identified.  Mediastinum/Nodes: There is no hilar or mediastinal adenopathy. Esophagus and the thyroid gland are grossly unremarkable. No mediastinal fluid collection. Lungs/Pleura: Minimal subpleural right lung base hazy nodularity, likely atelectatic changes. There is no consolidative changes. No pleural effusion or pneumothorax. The central airways are patent. Upper Abdomen: No acute abnormality. Musculoskeletal: Degenerative changes of the spine. No acute osseous pathology. Review of the MIP images confirms the above findings. IMPRESSION: No acute intrathoracic pathology. No CT evidence of central pulmonary artery embolus. Electronically Signed   By: Elgie CollardArash  Radparvar M.D.   On: 02/20/2018 01:41   Vas [place] Lower Extremity Venous (dvt) (only [facility])  Result Date: 02/20/2018  Lower Venous Study Indications: Pain, and Swelling.  Risk Factors: Obesity. Mobidly greaer than 179 Kg. Performing Technologist: Toma DeitersVirginia Slaughter RVS  Examination Guidelines: A complete evaluation includes B-mode imaging, spectral Doppler, color Doppler, and power Doppler as needed of all accessible portions of each vessel. Bilateral testing is considered an integral part of a complete examination. Limited examinations for reoccurring indications may be performed as noted.  Right Venous Findings: +---------+---------------+---------+-----------+----------+-------------------+          CompressibilityPhasicitySpontaneityPropertiesSummary             +---------+---------------+---------+-----------+----------+-------------------+ CFV      Full           Yes      Yes                                      +---------+---------------+---------+-----------+----------+-------------------+ SFJ      Full                                                             +---------+---------------+---------+-----------+----------+-------------------+ FV Prox  Full           Yes      Yes                                       +---------+---------------+---------+-----------+----------+-------------------+ FV Mid   Full                                                             +---------+---------------+---------+-----------+----------+-------------------+  FV DistalFull           Yes      Yes                                      +---------+---------------+---------+-----------+----------+-------------------+ PFV      Full           Yes      Yes                                      +---------+---------------+---------+-----------+----------+-------------------+ POP      Full           Yes      Yes                                      +---------+---------------+---------+-----------+----------+-------------------+ PTV      Full                                                             +---------+---------------+---------+-----------+----------+-------------------+ PERO                                                  Difficult to image                                                        due to body habitus +---------+---------------+---------+-----------+----------+-------------------+  Left Venous Findings: +---+---------------+---------+-----------+----------+-------+    CompressibilityPhasicitySpontaneityPropertiesSummary +---+---------------+---------+-----------+----------+-------+ CFVFull           Yes      Yes                          +---+---------------+---------+-----------+----------+-------+ SFJFull                                                 +---+---------------+---------+-----------+----------+-------+    Summary: Right: There is no evidence of deep vein thrombosis in the lower extremity. No cystic structure found in the popliteal fossa. Left: No evidence of a common femoral vein obstruction.  *See table(s) above for measurements and observations. Electronically signed by Gretta Began MD on 02/20/2018 at 2:04:25 PM.    Final    Disposition   Pt  is being discharged home today in good condition.  Follow-up Plans & Appointments    Follow-up Information    Berton Bon, NP Follow up.   Specialty:  Cardiology Why:  Cardiology hospital follow up on 03/05/18 at 9:00. Please arrive at 8:45.  Contact information: [address] [place] [address] [phone]          Discharge Instructions  AMB Referral to Cardiac Rehabilitation - Phase II   Complete by:  As directed    Diagnosis:   Coronary Stents NSTEMI     Amb Referral to Cardiac Rehabilitation   Complete by:  As directed    Diagnosis:   NSTEMI Coronary Stents     Diet - low sodium heart healthy   Complete by:  As directed    Discharge instructions   Complete by:  As directed    No lifting over 5 lbs for 1 week. No sexual activity for 1 week. You may return to work in 1 week. Keep procedure site clean & dry. If you notice increased pain, swelling, bleeding or pus, call/return!  You may shower, but no soaking baths/hot tubs/pools for 1 week.   Increase activity slowly   Complete by:  As directed       Discharge Medications   Allergies as of 02/22/2018   No Known Allergies     Medication List    TAKE these medications   albuterol 108 (90 Base) MCG/ACT inhaler Commonly known as:  VENTOLIN HFA Inhale 2 puffs into the lungs every 6 (six) hours as needed. What changed:  reasons to take this   amLODipine 5 MG tablet Commonly known as:  NORVASC Take 1 tablet (5 mg total) by mouth daily.   aspirin EC 81 MG tablet Take 1 tablet (81 mg total) by mouth daily.   atorvastatin 80 MG tablet Commonly known as:  LIPITOR Take 1 tablet (80 mg total) by mouth daily at 6 PM.   clopidogrel 75 MG tablet Commonly known as:  PLAVIX Take 1 tablet (75 mg total) by mouth daily. What changed:  Another medication with the same name was added. Make sure you understand how and when to take each.   clopidogrel 75 MG tablet Commonly known as:  PLAVIX Take 1  tablet (75 mg total) by mouth daily with breakfast. Start taking on:  February 23, 2018 What changed:  You were already taking a medication with the same name, and this prescription was added. Make sure you understand how and when to take each.   ezetimibe 10 MG tablet Commonly known as:  ZETIA Take 1 tablet (10 mg total) by mouth daily.   fluticasone 50 MCG/ACT nasal spray Commonly known as:  FLONASE Place 1 spray into both nostrils 2 (two) times daily.   furosemide 40 MG tablet Commonly known as:  LASIX TAKE 1 TABLET BY MOUTH TWICE DAILY What changed:  when to take this   glipiZIDE 5 MG tablet Commonly known as:  GLUCOTROL TAKE 1 TABLET BY MOUTH TWICE DAILY BEFORE MEAL(S) What changed:  See the new instructions.   isosorbide mononitrate 30 MG 24 hr tablet Commonly known as:  IMDUR TAKE 1 TABLET BY MOUTH ONCE DAILY   lisinopril 40 MG tablet Commonly known as:  PRINIVIL,ZESTRIL Take 1 tablet (40 mg total) by mouth daily.   metFORMIN 750 MG 24 hr tablet Commonly known as:  GLUCOPHAGE-XR Take 1 tablet (750 mg total) by mouth daily with breakfast.   metoprolol tartrate 50 MG tablet Commonly known as:  LOPRESSOR Take 1 tablet (50 mg total) by mouth 2 (two) times daily.   nitroGLYCERIN 0.4 MG SL tablet Commonly known as:  NITROSTAT Place 1 tablet (0.4 mg total) under the tongue every 5 (five) minutes x 3 doses as needed for chest pain.   potassium chloride SA 20 MEQ tablet Commonly known as:  K-DUR,KLOR-CON TAKE 2 TABLETS BY MOUTH IN  THE MORNING What changed:  See the new instructions.        Acute coronary syndrome (MI, NSTEMI, STEMI, etc) this admission?: Yes.     AHA/ACC Clinical Performance & Quality Measures: 3. Aspirin prescribed? - Yes 4. ADP Receptor Inhibitor (Plavix/Clopidogrel, Brilinta/Ticagrelor or Effient/Prasugrel) prescribed (includes medically managed patients)? - Yes 5. Beta Blocker prescribed? - Yes 6. High Intensity Statin (Lipitor 40-80mg  or  Crestor 20-40mg ) prescribed? - Yes 7. EF assessed during THIS hospitalization? - Yes 8. For EF <40%, was ACEI/ARB prescribed? - Not Applicable (EF >/= 40%) 9. For EF <40%, Aldosterone Antagonist (Spironolactone or Eplerenone) prescribed? - Not Applicable (EF >/= 40%) 10. Cardiac Rehab Phase II ordered (Included Medically managed Patients)? - Yes     Outstanding Labs/Studies   no  Duration of Discharge Encounter   Greater than 30 minutes including physician time.  Signed, Berton Bon, NP 02/22/2018, 11:02 AM

## 2018-02-22 NOTE — Discharge Instructions (Signed)
Radial Site Care Refer to this sheet in the next few weeks. These instructions provide you with information about caring for yourself after your procedure. Your health care provider may also give you more specific instructions. Your treatment has been planned according to current medical practices, but problems sometimes occur. Call your health care provider if you have any problems or questions after your procedure. What can I expect after the procedure? After your procedure, it is typical to have the following:  Bruising at the radial site that usually fades within 1-2 weeks.  Blood collecting in the tissue (hematoma) that may be painful to the touch. It should usually decrease in size and tenderness within 1-2 weeks.  Follow these instructions at home:  Take medicines only as directed by your health care provider.  You may shower 24-48 hours after the procedure or as directed by your health care provider. Remove the bandage (dressing) and gently wash the site with plain soap and water. Pat the area dry with a clean towel. Do not rub the site, because this may cause bleeding.  Do not take baths, swim, or use a hot tub until your health care provider approves.  Check your insertion site every day for redness, swelling, or drainage.  Do not apply powder or lotion to the site.  Do not flex or bend the affected arm for 24 hours or as directed by your health care provider.  Do not push or pull heavy objects with the affected arm for 24 hours or as directed by your health care provider.  Do not lift over 10 lb (4.5 kg) for 5 days after your procedure or as directed by your health care provider.  Ask your health care provider when it is okay to: ? Return to work or school. ? Resume usual physical activities or sports. ? Resume sexual activity.  Do not drive home if you are discharged the same day as the procedure. Have someone else drive you.  You may drive 24 hours after the procedure  unless otherwise instructed by your health care provider.  Do not operate machinery or power tools for 24 hours after the procedure.  If your procedure was done as an outpatient procedure, which means that you went home the same day as your procedure, a responsible adult should be with you for the first 24 hours after you arrive home.  Keep all follow-up visits as directed by your health care provider. This is important. Contact a health care provider if:  You have a fever.  You have chills.  You have increased bleeding from the radial site. Hold pressure on the site. Get help right away if:  You have unusual pain at the radial site.  You have redness, warmth, or swelling at the radial site.  You have drainage (other than a small amount of blood on the dressing) from the radial site.  The radial site is bleeding, and the bleeding does not stop after 30 minutes of holding steady pressure on the site.  Your arm or hand becomes pale, cool, tingly, or numb. This information is not intended to replace advice given to you by your health care provider. Make sure you discuss any questions you have with your health care provider. Document Released: 04/01/2010 Document Revised: 08/05/2015 Document Reviewed: 09/15/2013 Elsevier Interactive Patient Education  2018 [employer]    Information about your medication: Plavix (anti-platelet agent)  Generic Name (Brand): clopidogrel (Plavix), once daily medication  PURPOSE: You are taking this medication  along with aspirin to lower your chance of having a heart attack, stroke, or blood clots in your heart stent. These can be fatal. Brilinta and aspirin help prevent platelets from sticking together and forming a clot that can block an artery or your stent.   Common SIDE EFFECTS you may experience include: bruising or bleeding more easily, shortness of breath  Do not stop taking PLAVIX without talking to the doctor who prescribes it for you.  People who are treated with a stent and stop taking Plavix too soon, have a higher risk of getting a blood clot in the stent, having a heart attack, or dying. If you stop Plavix because of bleeding, or for other reasons, your risk of a heart attack or stroke may increase.   Tell all of your doctors and dentists that you are taking Plavix. They should talk to the doctor who prescribed plavix for you before you have any surgery or invasive procedure.   Contact your health care provider if you experience: severe or uncontrollable bleeding, pink/red/brown urine, vomiting blood or vomit that looks like "coffee grounds", red or black stools (looks like tar), coughing up blood or blood clots --------------------------------------------------------------------------------------------------------------

## 2018-02-25 ENCOUNTER — Telehealth (HOSPITAL_COMMUNITY): Payer: Self-pay

## 2018-02-25 NOTE — Telephone Encounter (Signed)
Pt insurance is active and benefits verified through [employer]. Co-pay $0.00, DED $250.00/$250.00 met, out of pocket $3,600.00/$910.75 met, co-insurance 10%. No pre-authorization required. Passport, 02/25/18 @ 3:25PM, REF# [id number]  Will contact patient to see if he is interested in the Cardiac Rehab Program. If interested, patient will need to complete follow up appt. Once completed, patient will be contacted for scheduling upon review by the RN Navigator.

## 2018-02-25 NOTE — Telephone Encounter (Signed)
Attempted to call patient in regards to Cardiac Rehab - unable to leave vmail, vmail full.

## 2018-03-05 ENCOUNTER — Ambulatory Visit: Payer: BLUE CROSS/BLUE SHIELD | Admitting: Cardiology

## 2018-03-05 ENCOUNTER — Encounter: Payer: Self-pay | Admitting: Cardiology

## 2018-03-05 VITALS — BP 126/80 | HR 67 | Ht 74.0 in | Wt 398.8 lb

## 2018-03-05 DIAGNOSIS — E785 Hyperlipidemia, unspecified: Secondary | ICD-10-CM

## 2018-03-05 DIAGNOSIS — I10 Essential (primary) hypertension: Secondary | ICD-10-CM | POA: Diagnosis not present

## 2018-03-05 DIAGNOSIS — I2510 Atherosclerotic heart disease of native coronary artery without angina pectoris: Secondary | ICD-10-CM | POA: Diagnosis not present

## 2018-03-05 DIAGNOSIS — I214 Non-ST elevation (NSTEMI) myocardial infarction: Secondary | ICD-10-CM

## 2018-03-05 DIAGNOSIS — Z6841 Body Mass Index (BMI) 40.0 and over, adult: Secondary | ICD-10-CM
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
## 2018-03-05 NOTE — Patient Instructions (Signed)
Medication Instructions:  Your physician recommends that you continue on your current medications as directed. Please refer to the Current Medication list given to you today.  If you need a refill on your cardiac medications before your next appointment, please call your pharmacy.   Lab work: None  If you have labs (blood work) drawn today and your tests are completely normal, you will receive your results only by: Marland Kitchen. MyChart Message (if you have MyChart) OR . A paper copy in the mail If you have any lab test that is abnormal or we need to change your treatment, we will call you to review the results.  Testing/Procedures: None  Follow-Up: At [facility], you and your health needs are our priority.  As part of our continuing mission to provide you with exceptional heart care, we have created designated Provider Care Teams.  These Care Teams include your primary Cardiologist (physician) and Advanced Practice Providers (APPs -  Physician Assistants and Nurse Practitioners) who all work together to provide you with the care you need, when you need it. You will need a follow up appointment in 3-4 months.  Please call our office 2 months in advance to schedule this appointment.  You may see Armanda Magicraci Turner, MD or one of the following Advanced Practice Providers on your designated Care Team:   FairchildBrittainy Simmons, PA-C Ronie Spiesayna Dunn, PA-C . Jacolyn ReedyMichele Lenze, PA-C  Any Other Special Instructions Will Be Listed Below (If Applicable).   DASH Eating Plan DASH stands for "Dietary Approaches to Stop Hypertension." The DASH eating plan is a healthy eating plan that has been shown to reduce high blood pressure (hypertension). It may also reduce your risk for type 2 diabetes, heart disease, and stroke. The DASH eating plan may also help with weight loss. What are tips for following this plan?  General guidelines  Avoid eating more than 2,300 mg (milligrams) of salt (sodium) a day. If you have hypertension,  you may need to reduce your sodium intake to 1,500 mg a day.  Limit alcohol intake to no more than 1 drink a day for nonpregnant women and 2 drinks a day for men. One drink equals 12 oz of beer, 5 oz of wine, or 1 oz of hard liquor.  Work with your health care provider to maintain a healthy body weight or to lose weight. Ask what an ideal weight is for you.  Get at least 30 minutes of exercise that causes your heart to beat faster (aerobic exercise) most days of the week. Activities may include walking, swimming, or biking.  Work with your health care provider or diet and nutrition specialist (dietitian) to adjust your eating plan to your individual calorie needs. Reading food labels   Check food labels for the amount of sodium per serving. Choose foods with less than 5 percent of the Daily Value of sodium. Generally, foods with less than 300 mg of sodium per serving fit into this eating plan.  To find whole grains, look for the word "whole" as the first word in the ingredient list. Shopping  Buy products labeled as "low-sodium" or "no salt added."  Buy fresh foods. Avoid canned foods and premade or frozen meals. Cooking  Avoid adding salt when cooking. Use salt-free seasonings or herbs instead of table salt or sea salt. Check with your health care provider or pharmacist before using salt substitutes.  Do not fry foods. Cook foods using healthy methods such as baking, boiling, grilling, and broiling instead.  Cook with  heart-healthy oils, such as olive, canola, soybean, or sunflower oil. Meal planning  Eat a balanced diet that includes: ? 5 or more servings of fruits and vegetables each day. At each meal, try to fill half of your plate with fruits and vegetables. ? Up to 6-8 servings of whole grains each day. ? Less than 6 oz of lean meat, poultry, or fish each day. A 3-oz serving of meat is about the same size as a deck of cards. One egg equals 1 oz. ? 2 servings of low-fat dairy  each day. ? A serving of nuts, seeds, or beans 5 times each week. ? Heart-healthy fats. Healthy fats called Omega-3 fatty acids are found in foods such as flaxseeds and coldwater fish, like sardines, salmon, and mackerel.  Limit how much you eat of the following: ? Canned or prepackaged foods. ? Food that is high in trans fat, such as fried foods. ? Food that is high in saturated fat, such as fatty meat. ? Sweets, desserts, sugary drinks, and other foods with added sugar. ? Full-fat dairy products.  Do not salt foods before eating.  Try to eat at least 2 vegetarian meals each week.  Eat more home-cooked food and less restaurant, buffet, and fast food.  When eating at a restaurant, ask that your food be prepared with less salt or no salt, if possible. What foods are recommended? The items listed may not be a complete list. Talk with your dietitian about what dietary choices are best for you. Grains Whole-grain or whole-wheat bread. Whole-grain or whole-wheat pasta. Brown rice. Orpah Cobb. Bulgur. Whole-grain and low-sodium cereals. Pita bread. Low-fat, low-sodium crackers. Whole-wheat flour tortillas. Vegetables Fresh or frozen vegetables (raw, steamed, roasted, or grilled). Low-sodium or reduced-sodium tomato and vegetable juice. Low-sodium or reduced-sodium tomato sauce and tomato paste. Low-sodium or reduced-sodium canned vegetables. Fruits All fresh, dried, or frozen fruit. Canned fruit in natural juice (without added sugar). Meat and other protein foods Skinless chicken or [place]. Ground chicken or [place]. Pork with fat trimmed off. Fish and seafood. Egg whites. Dried beans, peas, or lentils. Unsalted nuts, nut butters, and seeds. Unsalted canned beans. Lean cuts of beef with fat trimmed off. Low-sodium, lean deli meat. Dairy Low-fat (1%) or fat-free (skim) milk. Fat-free, low-fat, or reduced-fat cheeses. Nonfat, low-sodium ricotta or cottage cheese. Low-fat or nonfat yogurt.  Low-fat, low-sodium cheese. Fats and oils Soft margarine without trans fats. Vegetable oil. Low-fat, reduced-fat, or light mayonnaise and salad dressings (reduced-sodium). Canola, safflower, olive, soybean, and sunflower oils. Avocado. Seasoning and other foods Herbs. Spices. Seasoning mixes without salt. Unsalted popcorn and pretzels. Fat-free sweets. What foods are not recommended? The items listed may not be a complete list. Talk with your dietitian about what dietary choices are best for you. Grains Baked goods made with fat, such as croissants, muffins, or some breads. Dry pasta or rice meal packs. Vegetables Creamed or fried vegetables. Vegetables in a cheese sauce. Regular canned vegetables (not low-sodium or reduced-sodium). Regular canned tomato sauce and paste (not low-sodium or reduced-sodium). Regular tomato and vegetable juice (not low-sodium or reduced-sodium). Rosita Fire. Olives. Fruits Canned fruit in a light or heavy syrup. Fried fruit. Fruit in cream or butter sauce. Meat and other protein foods Fatty cuts of meat. Ribs. Fried meat. Tomasa Blase. Sausage. Bologna and other processed lunch meats. Salami. Fatback. Hotdogs. Bratwurst. Salted nuts and seeds. Canned beans with added salt. Canned or smoked fish. Whole eggs or egg yolks. Chicken or [place] with skin. Dairy Whole or 2%  milk, cream, and half-and-half. Whole or full-fat cream cheese. Whole-fat or sweetened yogurt. Full-fat cheese. Nondairy creamers. Whipped toppings. Processed cheese and cheese spreads. Fats and oils Butter. Stick margarine. Lard. Shortening. Ghee. Bacon fat. Tropical oils, such as coconut, palm kernel, or palm oil. Seasoning and other foods Salted popcorn and pretzels. Onion salt, garlic salt, seasoned salt, table salt, and sea salt. Worcestershire sauce. Tartar sauce. Barbecue sauce. Teriyaki sauce. Soy sauce, including reduced-sodium. Steak sauce. Canned and packaged gravies. Fish sauce. Oyster sauce. Cocktail  sauce. Horseradish that you find on the shelf. Ketchup. Mustard. Meat flavorings and tenderizers. Bouillon cubes. Hot sauce and Tabasco sauce. Premade or packaged marinades. Premade or packaged taco seasonings. Relishes. Regular salad dressings. Where to find more information:  National Heart, Lung, and Blood Institute: PopSteam.is  American Heart Association: www.heart.org Summary  The DASH eating plan is a healthy eating plan that has been shown to reduce high blood pressure (hypertension). It may also reduce your risk for type 2 diabetes, heart disease, and stroke.  With the DASH eating plan, you should limit salt (sodium) intake to 2,300 mg a day. If you have hypertension, you may need to reduce your sodium intake to 1,500 mg a day.  When on the DASH eating plan, aim to eat more fresh fruits and vegetables, whole grains, lean proteins, low-fat dairy, and heart-healthy fats.  Work with your health care provider or diet and nutrition specialist (dietitian) to adjust your eating plan to your individual calorie needs. This information is not intended to replace advice given to you by your health care provider. Make sure you discuss any questions you have with your health care provider. Document Released: 02/16/2011 Document Revised: 02/21/2016 Document Reviewed: 02/21/2016 Elsevier Interactive Patient Education  2019 [employer].

## 2018-03-15 NOTE — Telephone Encounter (Signed)
Attempted to call patient in regards to Cardiac Rehab - LM on VM 

## 2018-04-01 ENCOUNTER — Telehealth (HOSPITAL_COMMUNITY): Payer: Self-pay

## 2018-04-01 ENCOUNTER — Encounter (HOSPITAL_COMMUNITY): Payer: Self-pay

## 2018-04-01 NOTE — Telephone Encounter (Signed)
Attempted to call pt a 2nd time- LM ON VM ° °Mailed letter out °

## 2018-04-04 ENCOUNTER — Encounter: Payer: Self-pay | Admitting: Physician Assistant

## 2018-04-17 ENCOUNTER — Telehealth (HOSPITAL_COMMUNITY): Payer: Self-pay

## 2018-04-17 NOTE — Telephone Encounter (Signed)
3rd Attempted to call patient in regards to Cardiac Rehab - LM on VM °

## 2018-04-30 NOTE — Telephone Encounter (Signed)
Pt didn't return phone call closed referral Tempie Donning. Support Rep II

## 2018-05-21 ENCOUNTER — Ambulatory Visit: Payer: BLUE CROSS/BLUE SHIELD | Admitting: Physician Assistant

## 2018-06-18 ENCOUNTER — Other Ambulatory Visit: Payer: Self-pay | Admitting: Family Medicine

## 2018-06-18 DIAGNOSIS — J4520 Mild intermittent asthma, uncomplicated: Secondary | ICD-10-CM

## 2018-07-10 ENCOUNTER — Encounter: Payer: Self-pay | Admitting: Family Medicine

## 2018-07-10 ENCOUNTER — Other Ambulatory Visit: Payer: Self-pay

## 2018-07-10 ENCOUNTER — Telehealth: Payer: Self-pay | Admitting: Family Medicine

## 2018-07-10 ENCOUNTER — Ambulatory Visit (INDEPENDENT_AMBULATORY_CARE_PROVIDER_SITE_OTHER): Payer: BLUE CROSS/BLUE SHIELD | Admitting: Family Medicine

## 2018-07-10 DIAGNOSIS — J4520 Mild intermittent asthma, uncomplicated: Secondary | ICD-10-CM

## 2018-07-10 DIAGNOSIS — I2510 Atherosclerotic heart disease of native coronary artery without angina pectoris: Secondary | ICD-10-CM

## 2018-07-10 DIAGNOSIS — I214 Non-ST elevation (NSTEMI) myocardial infarction: Secondary | ICD-10-CM

## 2018-07-10 DIAGNOSIS — Z760 Encounter for issue of repeat prescription: Secondary | ICD-10-CM | POA: Diagnosis not present

## 2018-07-10 DIAGNOSIS — I10 Essential (primary) hypertension: Secondary | ICD-10-CM | POA: Diagnosis not present

## 2018-07-10 DIAGNOSIS — E119 Type 2 diabetes mellitus without complications: Secondary | ICD-10-CM

## 2018-07-10 DIAGNOSIS — J45909 Unspecified asthma, uncomplicated: Secondary | ICD-10-CM | POA: Insufficient documentation

## 2018-07-10 MED ORDER — GLUCOSE BLOOD VI STRP: ORAL_STRIP | 3 refills | Status: DC

## 2018-07-10 MED ORDER — GLIPIZIDE 5 MG PO TABS: ORAL_TABLET | ORAL | 3 refills | Status: DC

## 2018-07-10 MED ORDER — FUROSEMIDE 40 MG PO TABS: 40.0000 mg | ORAL_TABLET | Freq: Two times a day (BID) | ORAL | 3 refills | Status: DC

## 2018-07-10 MED ORDER — ALBUTEROL SULFATE HFA 108 (90 BASE) MCG/ACT IN AERS: 2.0000 | INHALATION_SPRAY | RESPIRATORY_TRACT | 5 refills | Status: DC | PRN

## 2018-07-10 NOTE — Telephone Encounter (Signed)
Spoke with the pt and appt has been made

## 2018-07-10 NOTE — Telephone Encounter (Signed)
Copied from CRM [id number]. Topic: Appointment Scheduling - Scheduling Inquiry for Clinic >> Jul 10, 2018  1:56 PM Maia Petties wrote: Reason for CRM: Pt called stating his pharmacy advised him to call office as Dr. Tawanna Cooler has retired. Pt states Dr. Tawanna Cooler is PCP. Epic shows Dr. Clent Ridges but last OV with Dr. Clent Ridges 10/24/2016. Pt said he is needing albuterol inhaler and furosemide. He is out of albuterol and will be out of furosemide tomorrow.
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
## 2018-07-11 ENCOUNTER — Telehealth: Payer: Self-pay | Admitting: Family Medicine

## 2018-07-11 MED ORDER — GLUCOSE BLOOD VI STRP: ORAL_STRIP | 3 refills | Status: DC

## 2018-07-11 NOTE — Telephone Encounter (Signed)
rx has been sent to the pharmacy. 

## 2018-07-11 NOTE — Telephone Encounter (Signed)
Copied from CRM [id number]. Topic: Quick Communication - Rx Refill/Question >> Jul 11, 2018  9:43 AM Doreatha Massed wrote: Medication: Glucose test strips. Need new prescription with directions  Has the patient contacted their pharmacy? pharmacy is calling (Agent: If no, request that the patient contact the pharmacy for the refill.) (Agent: If yes, when and what did the pharmacy advise? [facility]  Agent: Please be advised that RX refills may take up to 3 business days. We ask that you follow-up with your pharmacy.

## 2018-07-18 ENCOUNTER — Other Ambulatory Visit: Payer: Self-pay | Admitting: Family Medicine

## 2018-07-18 MED ORDER — BLOOD GLUCOSE TEST VI STRP: ORAL_STRIP | 6 refills | Status: DC

## 2018-07-24 DIAGNOSIS — Z8679 Personal history of other diseases of the circulatory system: Secondary | ICD-10-CM | POA: Diagnosis not present

## 2018-07-24 DIAGNOSIS — J209 Acute bronchitis, unspecified: Secondary | ICD-10-CM | POA: Diagnosis not present

## 2018-07-24 DIAGNOSIS — I252 Old myocardial infarction: Secondary | ICD-10-CM | POA: Diagnosis not present

## 2018-07-24 DIAGNOSIS — J019 Acute sinusitis, unspecified: Secondary | ICD-10-CM | POA: Diagnosis not present

## 2018-08-07 ENCOUNTER — Telehealth: Payer: Self-pay

## 2018-08-07 NOTE — Telephone Encounter (Signed)
Copied from CRM [id number]. Topic: General - Other >> Aug 07, 2018 12:39 PM Percival Spanish wrote:  Pharmacy told pt that he need a new blood sugar testing maching , Glucose Blood (BLOOD GLUCOSE TEST STRIPS) STRP was received by the pharmacy but said pt need a new maching alone with the needles    [address]

## 2018-08-08 MED ORDER — FREESTYLE FREEDOM LITE W/DEVICE KIT: PACK | 0 refills | Status: DC

## 2018-08-20 ENCOUNTER — Other Ambulatory Visit: Payer: Self-pay | Admitting: Cardiology

## 2018-09-03 ENCOUNTER — Telehealth: Payer: Self-pay | Admitting: Family Medicine

## 2018-09-03 DIAGNOSIS — I10 Essential (primary) hypertension: Secondary | ICD-10-CM

## 2018-09-03 DIAGNOSIS — Z760 Encounter for issue of repeat prescription: Secondary | ICD-10-CM

## 2018-09-03 NOTE — Telephone Encounter (Signed)
Medication was filled by Dr. Sherren Mocha 07/2017. Dr. Sarajane Jews please advise if its okay to refiill

## 2018-09-03 NOTE — Telephone Encounter (Signed)
Medication Refill - Medication:  potassium chloride SA (K-DUR,KLOR-CON) 20 MEQ tablet [[id number]]  Has the patient contacted their pharmacy? No. (Agent: If no, request that the patient contact the pharmacy for the refill.) (Agent: If yes, when and what did the pharmacy advise?)  Preferred Pharmacy (with phone number or street name):  [place], [place] [phone] (Phone) [phone] (Fax)     Agent: Please be advised that RX refills may take up to 3 business days. We ask that you follow-up with your pharmacy.

## 2018-09-04 NOTE — Telephone Encounter (Signed)
Call in #[phone] with 3 rf 

## 2018-09-05 ENCOUNTER — Other Ambulatory Visit: Payer: Self-pay | Admitting: Cardiology

## 2018-09-05 MED ORDER — POTASSIUM CHLORIDE CRYS ER 20 MEQ PO TBCR: EXTENDED_RELEASE_TABLET | ORAL | 3 refills | Status: DC

## 2018-09-05 NOTE — Telephone Encounter (Signed)
Pt is aware can take up to 3 business day °

## 2018-09-10 DIAGNOSIS — M170 Bilateral primary osteoarthritis of knee: Secondary | ICD-10-CM | POA: Diagnosis not present

## 2018-09-14 ENCOUNTER — Other Ambulatory Visit: Payer: Self-pay | Admitting: Cardiology

## 2018-09-24 ENCOUNTER — Other Ambulatory Visit: Payer: Self-pay | Admitting: Cardiology

## 2018-10-09 ENCOUNTER — Other Ambulatory Visit: Payer: Self-pay | Admitting: Cardiology

## 2018-10-14 ENCOUNTER — Other Ambulatory Visit: Payer: Self-pay | Admitting: Cardiology

## 2018-10-14 DIAGNOSIS — E119 Type 2 diabetes mellitus without complications: Secondary | ICD-10-CM

## 2018-12-05 DIAGNOSIS — E119 Type 2 diabetes mellitus without complications: Secondary | ICD-10-CM | POA: Diagnosis not present

## 2018-12-06 LAB — HM DIABETES EYE EXAM

## 2018-12-16 DIAGNOSIS — Z6841 Body Mass Index (BMI) 40.0 and over, adult: Secondary | ICD-10-CM | POA: Diagnosis not present

## 2018-12-16 DIAGNOSIS — Z20828 Contact with and (suspected) exposure to other viral communicable diseases: Secondary | ICD-10-CM | POA: Diagnosis not present

## 2018-12-20 ENCOUNTER — Encounter: Payer: Self-pay | Admitting: *Deleted

## 2018-12-26 ENCOUNTER — Other Ambulatory Visit: Payer: Self-pay | Admitting: Cardiology

## 2018-12-26 DIAGNOSIS — E119 Type 2 diabetes mellitus without complications: Secondary | ICD-10-CM

## 2019-01-01 ENCOUNTER — Other Ambulatory Visit: Payer: Self-pay | Admitting: Cardiology

## 2019-01-15 DIAGNOSIS — Z20828 Contact with and (suspected) exposure to other viral communicable diseases: Secondary | ICD-10-CM | POA: Diagnosis not present

## 2019-01-28 ENCOUNTER — Other Ambulatory Visit: Payer: Self-pay

## 2019-01-28 ENCOUNTER — Encounter: Payer: Self-pay | Admitting: Family Medicine

## 2019-01-28 ENCOUNTER — Telehealth: Payer: Self-pay | Admitting: Family Medicine

## 2019-01-28 ENCOUNTER — Ambulatory Visit: Payer: BC Managed Care – PPO | Admitting: Family Medicine

## 2019-01-28 VITALS — BP 150/90 | HR 65 | Temp 98.3°F | Ht 74.0 in | Wt >= 6400 oz

## 2019-01-28 DIAGNOSIS — I10 Essential (primary) hypertension: Secondary | ICD-10-CM | POA: Diagnosis not present

## 2019-01-28 DIAGNOSIS — I2510 Atherosclerotic heart disease of native coronary artery without angina pectoris: Secondary | ICD-10-CM

## 2019-01-28 DIAGNOSIS — Z125 Encounter for screening for malignant neoplasm of prostate: Secondary | ICD-10-CM

## 2019-01-28 DIAGNOSIS — Z0000 Encounter for general adult medical examination without abnormal findings: Secondary | ICD-10-CM

## 2019-01-28 DIAGNOSIS — N529 Male erectile dysfunction, unspecified: Secondary | ICD-10-CM

## 2019-01-28 DIAGNOSIS — J4520 Mild intermittent asthma, uncomplicated: Secondary | ICD-10-CM

## 2019-01-28 DIAGNOSIS — E119 Type 2 diabetes mellitus without complications: Secondary | ICD-10-CM

## 2019-01-28 DIAGNOSIS — E785 Hyperlipidemia, unspecified: Secondary | ICD-10-CM

## 2019-01-28 LAB — CBC WITH DIFFERENTIAL/PLATELET
Basophils Absolute: 0.1 10*3/uL (ref 0.0–0.1)
Basophils Relative: 1.6 % (ref 0.0–3.0)
Eosinophils Absolute: 0.3 10*3/uL (ref 0.0–0.7)
Eosinophils Relative: 6.4 % — ABNORMAL HIGH (ref 0.0–5.0)
HCT: 41.2 % (ref 39.0–52.0)
Hemoglobin: 13.5 g/dL (ref 13.0–17.0)
Lymphocytes Relative: 31.5 % (ref 12.0–46.0)
Lymphs Abs: 1.7 10*3/uL (ref 0.7–4.0)
MCHC: 32.8 g/dL (ref 30.0–36.0)
MCV: 83.4 fl (ref 78.0–100.0)
Monocytes Absolute: 0.6 10*3/uL (ref 0.1–1.0)
Monocytes Relative: 11.1 % (ref 3.0–12.0)
Neutro Abs: 2.7 10*3/uL (ref 1.4–7.7)
Neutrophils Relative %: 49.4 % (ref 43.0–77.0)
Platelets: 262 10*3/uL (ref 150.0–400.0)
RBC: 4.93 Mil/uL (ref 4.22–5.81)
RDW: 14.5 % (ref 11.5–15.5)
WBC: 5.4 10*3/uL (ref 4.0–10.5)

## 2019-01-28 LAB — LIPID PANEL
Cholesterol: 115 mg/dL (ref 0–200)
HDL: 32 mg/dL — ABNORMAL LOW (ref >39.00)
LDL Cholesterol: 74 mg/dL (ref 0–99)
NonHDL: 82.86
Total CHOL/HDL Ratio: 4
Triglycerides: 43 mg/dL (ref 0.0–149.0)
VLDL: 8.6 mg/dL (ref 0.0–40.0)

## 2019-01-28 LAB — BASIC METABOLIC PANEL
BUN: 16 mg/dL (ref 6–23)
CO2: 28 mEq/L (ref 19–32)
Calcium: 9.1 mg/dL (ref 8.4–10.5)
Chloride: 105 mEq/L (ref 96–112)
Creatinine, Ser: 0.84 mg/dL (ref 0.40–1.50)
GFR: 116.11 mL/min (ref >60.00)
Glucose, Bld: 141 mg/dL — ABNORMAL HIGH (ref 70–99)
Potassium: 4 mEq/L (ref 3.5–5.1)
Sodium: 140 mEq/L (ref 135–145)

## 2019-01-28 LAB — HEPATIC FUNCTION PANEL
ALT: 27 U/L (ref 0–53)
AST: 19 U/L (ref 0–37)
Albumin: 4.3 g/dL (ref 3.5–5.2)
Alkaline Phosphatase: 60 U/L (ref 39–117)
Bilirubin, Direct: 0.1 mg/dL (ref 0.0–0.3)
Total Bilirubin: 0.5 mg/dL (ref 0.2–1.2)
Total Protein: 6.7 g/dL (ref 6.0–8.3)

## 2019-01-28 LAB — PSA: PSA: 0.23 ng/mL (ref 0.10–4.00)

## 2019-01-28 LAB — TSH: TSH: 1.06 u[IU]/mL (ref 0.35–4.50)

## 2019-01-28 LAB — TESTOSTERONE: Testosterone: 362.75 ng/dL (ref 300.00–890.00)

## 2019-01-28 LAB — HEMOGLOBIN A1C: Hgb A1c MFr Bld: 6.8 % — ABNORMAL HIGH (ref 4.6–6.5)

## 2019-01-28 MED ORDER — TADALAFIL 20 MG PO TABS: 20.0000 mg | ORAL_TABLET | Freq: Every day | ORAL | 5 refills | Status: DC | PRN

## 2019-01-28 NOTE — Progress Notes (Signed)
   Subjective:    Patient ID: Raymond Lutz, male    DOB: [birth year], 53 y.o.   MRN: [id number]  HPI Here to follow up on issues and for fasting labs. Also he asks for help with erectile problems. His desire is strong but sometimes erections are weak. His BP at home this morning was 135/78 and seems to be stable. He does not check his glucose very often.    Review of Systems  Constitutional: Negative.   Respiratory: Negative.   Cardiovascular: Negative.   Gastrointestinal: Negative.   Genitourinary: Negative.   Neurological: Negative.        Objective:   Physical Exam Constitutional:      Appearance: He is obese.  Cardiovascular:     Rate and Rhythm: Normal rate and regular rhythm.     Pulses: Normal pulses.     Heart sounds: Normal heart sounds.  Pulmonary:     Effort: Pulmonary effort is normal.     Breath sounds: Normal breath sounds.  Neurological:     General: No focal deficit present.     Mental Status: He is alert and oriented to person, place, and time.           Assessment & Plan:  His HTN is stable. For the diabetes we will check an A1c today. Check other labs today including lipids and PSA. For the ED we will check a testosterone level, and he will try Cialis 20 mg as needed. Set up a colonoscopy. He got a flu shot a few weeks ago.  Alysia Penna, MD

## 2019-01-28 NOTE — Telephone Encounter (Signed)
Pharmacy called regarding pts Cialis. Pharmacy states pt is taking isosorbide and both medication could possibly cause a drop in BP. Please advise.    [facility] [place], [place] [address]  Phone: [phone] Fax: [phone]  Not a 24 hour pharmacy; exact hours not known.

## 2019-01-28 NOTE — Telephone Encounter (Signed)
Please advise 

## 2019-01-29 NOTE — Telephone Encounter (Signed)
Pharmacist notified of update. Will refill pt med. Called pt with update but no answer

## 2019-01-29 NOTE — Telephone Encounter (Signed)
Pt called in and state that the pharmacy told him that they had speak with the provider before they could fill it because of a drug interaction.  Please advise Best number [phone]

## 2019-01-29 NOTE — Telephone Encounter (Signed)
I am aware of this. Please fill the rx

## 2019-02-19 ENCOUNTER — Other Ambulatory Visit: Payer: Self-pay

## 2019-02-19 DIAGNOSIS — Z20822 Contact with and (suspected) exposure to covid-19: Secondary | ICD-10-CM

## 2019-02-21 LAB — NOVEL CORONAVIRUS, NAA: SARS-CoV-2, NAA: NOT DETECTED

## 2019-02-22 DIAGNOSIS — Z03818 Encounter for observation for suspected exposure to other biological agents ruled out: Secondary | ICD-10-CM | POA: Diagnosis not present

## 2019-02-22 DIAGNOSIS — Z20828 Contact with and (suspected) exposure to other viral communicable diseases: Secondary | ICD-10-CM | POA: Diagnosis not present

## 2019-02-24 ENCOUNTER — Other Ambulatory Visit: Payer: Self-pay | Admitting: Cardiology

## 2019-02-27 ENCOUNTER — Encounter: Payer: Self-pay | Admitting: Family Medicine

## 2019-03-03 DIAGNOSIS — Z20828 Contact with and (suspected) exposure to other viral communicable diseases: Secondary | ICD-10-CM | POA: Diagnosis not present

## 2019-03-03 DIAGNOSIS — R519 Headache, unspecified: Secondary | ICD-10-CM | POA: Diagnosis not present

## 2019-03-03 DIAGNOSIS — I10 Essential (primary) hypertension: Secondary | ICD-10-CM | POA: Diagnosis not present

## 2019-03-10 ENCOUNTER — Telehealth: Payer: Self-pay | Admitting: *Deleted

## 2019-03-10 NOTE — Telephone Encounter (Signed)
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
## 2019-03-27 ENCOUNTER — Encounter: Payer: Self-pay | Admitting: Cardiology

## 2019-03-27 ENCOUNTER — Other Ambulatory Visit: Payer: Self-pay

## 2019-03-27 ENCOUNTER — Telehealth (INDEPENDENT_AMBULATORY_CARE_PROVIDER_SITE_OTHER): Payer: BC Managed Care – PPO | Admitting: Cardiology

## 2019-03-27 VITALS — BP 134/76 | HR 65 | Ht 74.0 in | Wt 385.0 lb

## 2019-03-27 DIAGNOSIS — E785 Hyperlipidemia, unspecified: Secondary | ICD-10-CM | POA: Diagnosis not present

## 2019-03-27 DIAGNOSIS — I2510 Atherosclerotic heart disease of native coronary artery without angina pectoris: Secondary | ICD-10-CM

## 2019-03-27 DIAGNOSIS — E119 Type 2 diabetes mellitus without complications: Secondary | ICD-10-CM

## 2019-03-27 DIAGNOSIS — I10 Essential (primary) hypertension: Secondary | ICD-10-CM | POA: Diagnosis not present

## 2019-03-27 MED ORDER — EZETIMIBE 10 MG PO TABS: 10.0000 mg | ORAL_TABLET | Freq: Every day | ORAL | 0 refills | Status: DC

## 2019-03-27 MED ORDER — ISOSORBIDE MONONITRATE ER 30 MG PO TB24: 30.0000 mg | ORAL_TABLET | Freq: Every day | ORAL | 0 refills | Status: DC

## 2019-03-27 MED ORDER — LISINOPRIL 40 MG PO TABS: 40.0000 mg | ORAL_TABLET | Freq: Every day | ORAL | 0 refills | Status: DC

## 2019-03-27 MED ORDER — METOPROLOL TARTRATE 50 MG PO TABS: 50.0000 mg | ORAL_TABLET | Freq: Two times a day (BID) | ORAL | 0 refills | Status: DC

## 2019-03-27 NOTE — Patient Instructions (Signed)
Medication Instructions:  Your physician recommends that you continue on your current medications as directed. Please refer to the Current Medication list given to you today. *If you need a refill on your cardiac medications before your next appointment, please call your pharmacy*  Follow-Up: At [facility], you and your health needs are our priority.  As part of our continuing mission to provide you with exceptional heart care, we have created designated Provider Care Teams.  These Care Teams include your primary Cardiologist (physician) and Advanced Practice Providers (APPs -  Physician Assistants and Nurse Practitioners) who all work together to provide you with the care you need, when you need it.  Your next appointment:   1 year(s)  The format for your next appointment:   In Person  Provider:   You may see Armanda Magic, MD or one of the following Advanced Practice Providers on your designated Care Team:    Ronie Spies, PA-C  Jacolyn Reedy, PA-C   Other Instructions COVID-19 Vaccine Information can be found at: [email or web address] For questions related to vaccine distribution or appointments, please email [email or web address] or call [phone].

## 2019-04-03 ENCOUNTER — Other Ambulatory Visit: Payer: Self-pay | Admitting: Cardiology

## 2019-04-06 DIAGNOSIS — B349 Viral infection, unspecified: Secondary | ICD-10-CM | POA: Diagnosis not present

## 2019-04-17 ENCOUNTER — Other Ambulatory Visit: Payer: Self-pay

## 2019-04-18 MED ORDER — ISOSORBIDE MONONITRATE ER 30 MG PO TB24: 30.0000 mg | ORAL_TABLET | Freq: Every day | ORAL | 3 refills | Status: DC

## 2019-04-23 ENCOUNTER — Telehealth (INDEPENDENT_AMBULATORY_CARE_PROVIDER_SITE_OTHER): Payer: BC Managed Care – PPO | Admitting: Family Medicine

## 2019-04-23 ENCOUNTER — Other Ambulatory Visit: Payer: Self-pay

## 2019-04-23 DIAGNOSIS — M545 Low back pain, unspecified: Secondary | ICD-10-CM

## 2019-04-23 MED ORDER — TRAMADOL HCL 50 MG PO TABS: 100.0000 mg | ORAL_TABLET | Freq: Four times a day (QID) | ORAL | 0 refills | Status: DC | PRN

## 2019-04-23 MED ORDER — METHYLPREDNISOLONE 4 MG PO TBPK: ORAL_TABLET | ORAL | 0 refills | Status: DC

## 2019-04-23 MED ORDER — CYCLOBENZAPRINE HCL 10 MG PO TABS: 10.0000 mg | ORAL_TABLET | Freq: Three times a day (TID) | ORAL | 0 refills | Status: DC | PRN
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
## 2019-04-24 ENCOUNTER — Telehealth: Payer: Self-pay | Admitting: Family Medicine

## 2019-04-24 NOTE — Telephone Encounter (Signed)
Pt was seen yesterday and is requesting an excuse note for work for yesterday(04/23/19 and today(04/24/19). Please send to Mychart. Thanks

## 2019-04-25 NOTE — Telephone Encounter (Signed)
Yes please do the note

## 2019-05-11 DIAGNOSIS — Z20828 Contact with and (suspected) exposure to other viral communicable diseases: Secondary | ICD-10-CM | POA: Diagnosis not present

## 2019-05-11 DIAGNOSIS — Z03818 Encounter for observation for suspected exposure to other biological agents ruled out: Secondary | ICD-10-CM | POA: Diagnosis not present

## 2019-05-13 ENCOUNTER — Other Ambulatory Visit: Payer: Self-pay | Admitting: Cardiology

## 2019-05-13 DIAGNOSIS — E119 Type 2 diabetes mellitus without complications: Secondary | ICD-10-CM

## 2019-05-14 ENCOUNTER — Other Ambulatory Visit: Payer: Self-pay

## 2019-05-15 ENCOUNTER — Ambulatory Visit: Payer: BC Managed Care – PPO | Admitting: Family Medicine

## 2019-05-15 ENCOUNTER — Encounter: Payer: Self-pay | Admitting: Family Medicine

## 2019-05-15 VITALS — BP 170/90 | HR 75 | Temp 98.5°F | Wt >= 6400 oz

## 2019-05-15 DIAGNOSIS — I10 Essential (primary) hypertension: Secondary | ICD-10-CM | POA: Diagnosis not present

## 2019-05-15 DIAGNOSIS — E119 Type 2 diabetes mellitus without complications: Secondary | ICD-10-CM | POA: Diagnosis not present

## 2019-05-15 LAB — HEMOGLOBIN A1C: Hgb A1c MFr Bld: 6.8 % — ABNORMAL HIGH (ref 4.6–6.5)

## 2019-05-15 NOTE — Progress Notes (Signed)
   Subjective:    Patient ID: Raymond Lutz, male    DOB: 01/31/1966, 54 y.o.   MRN: [id number]  HPI Here for low glucose readings. He had been doing well last fall at his [facility]. His fasting glucoses at that time averaged from 90-120. His A1c in Novemeber was 6.8. He has been taking Glipizide 5 mg BID. Then about a month ago he got serious about losing weight and he adopted a ketogenic diet. His glucoses began to drop into the 50's and 60's, and he would have spells of feeling shaky and lightheaded. He is walking for exercise.    Review of Systems  Constitutional: Negative.   Respiratory: Negative.   Cardiovascular: Negative.   Neurological: Positive for light-headedness.       Objective:   Physical Exam Constitutional:      General: He is not in acute distress.    Appearance: Normal appearance. He is obese.  Cardiovascular:     Rate and Rhythm: Normal rate and regular rhythm.     Pulses: Normal pulses.     Heart sounds: Normal heart sounds.  Pulmonary:     Effort: Pulmonary effort is normal.     Breath sounds: Normal breath sounds.  Neurological:     General: No focal deficit present.     Mental Status: He is alert and oriented to person, place, and time.           Assessment & Plan:  Type 2 diabetes now with hypoglycemic episodes. He will stop the Glipizide, and we will attempt to control the diabetes with exercise and diet alone. He will stay on the keto diet, and we will follow up in 4 weeks. Gershon Crane, MD

## 2019-05-16 ENCOUNTER — Other Ambulatory Visit: Payer: Self-pay | Admitting: Cardiology

## 2019-05-16 DIAGNOSIS — E119 Type 2 diabetes mellitus without complications: Secondary | ICD-10-CM

## 2019-05-19 ENCOUNTER — Other Ambulatory Visit: Payer: Self-pay | Admitting: Cardiology

## 2019-05-19 DIAGNOSIS — E119 Type 2 diabetes mellitus without complications: Secondary | ICD-10-CM

## 2019-05-21 ENCOUNTER — Other Ambulatory Visit: Payer: Self-pay

## 2019-05-21 ENCOUNTER — Other Ambulatory Visit: Payer: Self-pay | Admitting: Cardiology

## 2019-05-21 ENCOUNTER — Telehealth: Payer: Self-pay

## 2019-05-21 DIAGNOSIS — E119 Type 2 diabetes mellitus without complications: Secondary | ICD-10-CM

## 2019-05-21 NOTE — Telephone Encounter (Signed)
Pt called requesting a refill on Atorvastatin. This medication is not on his list and was discontinued in the last virtual visit. Should the pt still be taking this medication? He would like a call back at [phone].

## 2019-05-21 NOTE — Telephone Encounter (Signed)
Left message for patient to call back  

## 2019-05-22 ENCOUNTER — Other Ambulatory Visit: Payer: Self-pay | Admitting: Cardiology

## 2019-05-22 DIAGNOSIS — E119 Type 2 diabetes mellitus without complications: Secondary | ICD-10-CM

## 2019-05-22 MED ORDER — ATORVASTATIN CALCIUM 80 MG PO TABS: 80.0000 mg | ORAL_TABLET | Freq: Every day | ORAL | 3 refills | Status: DC

## 2019-05-22 NOTE — Telephone Encounter (Signed)
Follow Up  Patient is returning call. Please give patient call back to assist.

## 2019-05-22 NOTE — Telephone Encounter (Signed)
Spoke with patient who states he is out of atorvastatin and needs a refill. The medication was taken off of his list inadvertently at his last telemedicine visit, however he should be continuing to take it. I will add it back to his list and send in a refill.

## 2019-06-24 ENCOUNTER — Other Ambulatory Visit: Payer: Self-pay

## 2019-06-25 ENCOUNTER — Ambulatory Visit (INDEPENDENT_AMBULATORY_CARE_PROVIDER_SITE_OTHER): Payer: BC Managed Care – PPO | Admitting: Family Medicine

## 2019-06-25 ENCOUNTER — Telehealth: Payer: Self-pay | Admitting: Family Medicine

## 2019-06-25 ENCOUNTER — Ambulatory Visit (INDEPENDENT_AMBULATORY_CARE_PROVIDER_SITE_OTHER): Payer: BC Managed Care – PPO

## 2019-06-25 ENCOUNTER — Encounter: Payer: Self-pay | Admitting: Family Medicine

## 2019-06-25 VITALS — BP 150/100 | HR 78 | Temp 98.1°F | Wt 399.6 lb

## 2019-06-25 DIAGNOSIS — R0781 Pleurodynia: Secondary | ICD-10-CM | POA: Diagnosis not present

## 2019-06-25 MED ORDER — TRAMADOL HCL 50 MG PO TABS: 100.0000 mg | ORAL_TABLET | Freq: Four times a day (QID) | ORAL | 0 refills | Status: DC | PRN

## 2019-06-25 NOTE — Telephone Encounter (Signed)
The patient called because the pharmacy won't fill the pain medication that Dr. Clent Ridges called in for him today without talking with Dr. Clent Ridges.  Please advise

## 2019-06-25 NOTE — Telephone Encounter (Signed)
Spoke with the patients pharmacy and this medication needs a PA.  Spoke with the patient. He is aware that it usually takes a week for me to hear back from the insurance company. He would like to know if there is an alternative that we can call in.  Aware that Dr. Clent Ridges is out of the office until tomorrow

## 2019-06-25 NOTE — Progress Notes (Signed)
   Subjective:    Patient ID: Raymond Lutz, male    DOB: [birth year], 54 y.o.   MRN: [id number]  HPI Here for the sudden onset of sharp pain in the right lower ribs about 3 days ago. No known trauma. He is not SOB but it hurts to take a deep breath. No coughing.    Review of Systems  Constitutional: Negative.   Respiratory: Negative.   Cardiovascular: Positive for chest pain. Negative for palpitations and leg swelling.  Gastrointestinal: Negative.        Objective:   Physical Exam Constitutional:      Appearance: He is obese.     Comments: In pain   Cardiovascular:     Rate and Rhythm: Normal rate and regular rhythm.     Pulses: Normal pulses.     Heart sounds: Normal heart sounds.  Pulmonary:     Effort: Pulmonary effort is normal.     Breath sounds: Normal breath sounds.     Comments: He is very tender over the right inferior anterior ribs, no crepitus  Abdominal:     General: Abdomen is flat. Bowel sounds are normal. There is no distension.     Palpations: Abdomen is soft. There is no mass.     Tenderness: There is no abdominal tenderness. There is no guarding or rebound.     Hernia: No hernia is present.  Neurological:     Mental Status: He is alert.           Assessment & Plan:  Rib pain, treat with Tramadol. Get Xrays of the right ribs today.  Gershon Crane, MD

## 2019-06-26 ENCOUNTER — Ambulatory Visit: Payer: BC Managed Care – PPO | Admitting: Family Medicine

## 2019-06-26 MED ORDER — HYDROCODONE-ACETAMINOPHEN 5-325 MG PO TABS: 1.0000 | ORAL_TABLET | ORAL | 0 refills | Status: AC | PRN

## 2019-06-26 NOTE — Addendum Note (Signed)
Addended by: Gershon Crane A on: 06/26/2019 08:16 AM   Modules accepted: Orders

## 2019-06-26 NOTE — Telephone Encounter (Signed)
I sent in some Norco instead

## 2019-07-03 ENCOUNTER — Other Ambulatory Visit: Payer: Self-pay | Admitting: Cardiology

## 2019-07-09 ENCOUNTER — Other Ambulatory Visit: Payer: Self-pay | Admitting: Cardiology

## 2019-07-09 NOTE — Telephone Encounter (Signed)
Pt's medication was sent to pt's pharmacy as requested. Confirmation received.  °

## 2019-07-14 ENCOUNTER — Other Ambulatory Visit: Payer: Self-pay | Admitting: Family Medicine

## 2019-07-14 DIAGNOSIS — I10 Essential (primary) hypertension: Secondary | ICD-10-CM

## 2019-07-14 DIAGNOSIS — Z760 Encounter for issue of repeat prescription: Secondary | ICD-10-CM

## 2019-08-02 ENCOUNTER — Emergency Department: Payer: No Typology Code available for payment source

## 2019-08-02 ENCOUNTER — Other Ambulatory Visit: Payer: Self-pay

## 2019-08-02 ENCOUNTER — Emergency Department
Admission: EM | Admit: 2019-08-02 | Discharge: 2019-08-02 | Disposition: A | Discharge status: Home / Self Care | Payer: No Typology Code available for payment source | Attending: Emergency Medicine | Admitting: Emergency Medicine

## 2019-08-02 DIAGNOSIS — Z7984 Long term (current) use of oral hypoglycemic drugs: Secondary | ICD-10-CM | POA: Insufficient documentation

## 2019-08-02 DIAGNOSIS — S060X0A Concussion without loss of consciousness, initial encounter: Secondary | ICD-10-CM | POA: Insufficient documentation

## 2019-08-02 DIAGNOSIS — Z79899 Other long term (current) drug therapy: Secondary | ICD-10-CM | POA: Insufficient documentation

## 2019-08-02 DIAGNOSIS — E119 Type 2 diabetes mellitus without complications: Secondary | ICD-10-CM | POA: Diagnosis not present

## 2019-08-02 DIAGNOSIS — I2510 Atherosclerotic heart disease of native coronary artery without angina pectoris: Secondary | ICD-10-CM | POA: Insufficient documentation

## 2019-08-02 DIAGNOSIS — Y929 Unspecified place or not applicable: Secondary | ICD-10-CM | POA: Diagnosis not present

## 2019-08-02 DIAGNOSIS — Y9389 Activity, other specified: Secondary | ICD-10-CM | POA: Insufficient documentation

## 2019-08-02 DIAGNOSIS — W2209XA Striking against other stationary object, initial encounter: Secondary | ICD-10-CM | POA: Diagnosis not present

## 2019-08-02 DIAGNOSIS — Y990 Civilian activity done for income or pay: Secondary | ICD-10-CM | POA: Insufficient documentation

## 2019-08-02 DIAGNOSIS — S0990XA Unspecified injury of head, initial encounter: Secondary | ICD-10-CM

## 2019-08-02 DIAGNOSIS — Z7902 Long term (current) use of antithrombotics/antiplatelets: Secondary | ICD-10-CM | POA: Insufficient documentation

## 2019-08-02 NOTE — ED Notes (Signed)
Pt verbalized understanding of discharge instructions. NAD at this time. 
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
## 2019-08-02 NOTE — ED Notes (Signed)
D/w Dr. Fuller Plan, new order received for CT head w/o Contrast.  If patient has C-spine tenderness order C-spine CT, at this time patient denies any pain and denies any tenderness to palpation.

## 2019-08-02 NOTE — ED Triage Notes (Addendum)
Pt arrived via POV with reports of he was cleaning under malt machine at work, pt states he hit the top of his head when underneath it, denies any LOC, states he did have a knot on the top of his.  PT states injury happened at 11pm last night.  Pt states he woke up this morning he had a headache, pt states when he was driving to work he noticed light sensitivity and felt nauseated.  Pt takes Plavix.  Pt referred here from [facility] for CT head.

## 2019-08-04 ENCOUNTER — Ambulatory Visit (INDEPENDENT_AMBULATORY_CARE_PROVIDER_SITE_OTHER): Payer: BC Managed Care – PPO | Admitting: Family Medicine

## 2019-08-04 ENCOUNTER — Other Ambulatory Visit: Payer: Self-pay

## 2019-08-04 ENCOUNTER — Encounter: Payer: Self-pay | Admitting: Family Medicine

## 2019-08-04 VITALS — BP 136/80 | HR 68 | Temp 97.9°F | Wt 395.0 lb

## 2019-08-04 DIAGNOSIS — Z0279 Encounter for issue of other medical certificate: Secondary | ICD-10-CM

## 2019-08-04 DIAGNOSIS — S060X0D Concussion without loss of consciousness, subsequent encounter: Secondary | ICD-10-CM

## 2019-08-04 NOTE — Progress Notes (Signed)
  Subjective:    Raymond Lutz is a 54 y.o. male who presents for f/u from a concussion. Initial evaluation was performed in the Emergency Department. Injury occurred 4 days ago (08/01/19) while at work. Mechanism of injury was head hitting metal cabinet after standing from bending down. The point of impact was the top of head. Patient did not experience an altered level of consciousness. Patient did not have amnesia.  Co-workers noted pt was not acting himself-less joking and energetic.  [facility] but was sent the the ED 5/22 given current plavix use for h/o NSTEMI.  CT head negative. Since the injury, his symptoms include sensitivity to light and a HA if exposed to light.  Wearing dark sun blocking disposable film behind glasses.   He has had no previous head injuries.   Pt denies changes in mood, insomnia, nausea vomiting, mental fogginess.  Pt eager to return to work.  The following portions of the patient's history were reviewed and updated as appropriate: allergies, current medications, past family history, past medical history, past social history, past surgical history and problem list.  Review of Systems Pertinent items noted in HPI and remainder of comprehensive ROS otherwise negative.    Objective:    BP 136/80 (BP Location: Left Arm, Patient Position: Sitting, Cuff Size: Large) Comment (Cuff Size): Thigh high cuff  Pulse 68   Temp 97.9 F (36.6 C) (Temporal)   Wt (!) 395 lb (179.2 kg)   SpO2 98%   BMI 50.71 kg/m  General appearance: alert, cooperative, no distress and wearing dark film behind glasses to block light Head: Normocephalic, without obvious abnormality, atraumatic, area of TTP near R coronal suture Eyes: conjunctivae/corneas clear. PERRL, EOM's intact. Fundi benign., sensitivity to light Lungs: clear to auscultation bilaterally Heart: regular rate and rhythm, S1, S2 normal, no murmur, click, rub or gallop Extremities: extremities normal, atraumatic, no  cyanosis or edema Neurologic: Alert and oriented X 3, normal strength and tone. Normal symmetric reflexes. Normal coordination and gait Coordination: normal Gait: Normal    Assessment:    Pt is a 54 yo male with sensitivity to light s/p mild concussion. Plan:  Concussion without loss of consciousness, subsequent encounter -CT head from 08/02/19 reviewed and normal. -Post-concussion and recovery plan handout given and reviewed in detail. -Patient given a note for work.  Will attempt to return on Friday if symptoms have resolved. -given precautions  Follow-up with pcp as needed  Abbe Amsterdam, MD

## 2019-08-04 NOTE — Patient Instructions (Signed)
Concussion, Adult  A concussion is a brain injury from a hard, direct hit (trauma) to the head or body. This direct hit causes the brain to shake quickly back and forth inside the skull. This can damage brain cells and cause chemical changes in the brain. A concussion may also be known as a mild traumatic brain injury (TBI). Concussions are usually not life-threatening, but the effects of a concussion can be serious. If you have a concussion, you should be very careful to avoid having a second concussion. What are the causes? This condition is caused by:  A direct hit to your head, such as: ? Running into another player during a game. ? Being hit in a fight. ? Hitting your head on a hard surface.  Sudden movement of your body that causes your brain to move back and forth inside the skull, such as in a car crash. What are the signs or symptoms? The signs of a concussion can be hard to notice. Early on, they may be missed by you, family members, and health care providers. You may look fine on the outside but may act or feel differently. Symptoms are usually temporary and most often improve in 7-10 days. Some symptoms appear right away, but other symptoms may not show up for hours or days. If your symptoms last longer than normal, you may have post-concussion syndrome. Every head injury is different. Physical symptoms  Headaches. This can include a feeling of pressure in the head or migraine-like symptoms.  Tiredness (fatigue).  Dizziness.  Problems with coordination or balance.  Vision or hearing problems.  Sensitivity to light or noise.  Nausea or vomiting.  Changes in eating or sleeping patterns.  Numbness or tingling.  Seizure. Mental and emotional symptoms  Memory problems.  Trouble concentrating, organizing, or making decisions.  Slowness in thinking, acting or reacting, speaking, or reading.  Irritability or mood changes.  Anxiety or depression. How is this  diagnosed? This condition is diagnosed based on:  Your symptoms.  A description of your injury. You may also have tests, including:  Imaging tests, such as a CT scan or MRI.  Neuropsychological tests. These measure your thinking, understanding, learning, and remembering abilities. How is this treated? Treatment for this condition includes:  Stopping sports or activity if you are injured. If you hit your head or show signs of concussion: ? Do not return to sports or activities the same day. ? Get checked by a health care provider before you return to your activities.  Physical and mental rest and careful observation, usually at home. Gradually return to your normal activities.  Medicines to help with symptoms such as headaches, nausea, or difficulty sleeping. ? Avoid taking opioid pain medicine while recovering from a concussion.  Avoiding alcohol and drugs. These may slow your recovery and can put you at risk of further injury.  Referral to a concussion clinic or rehabilitation center. Recovery from a concussion can take time. How fast you recover depends on many factors. Return to activities only when:  Your symptoms are completely gone.  Your health care provider says that it is safe. Follow these instructions at home: Activity  Limit activities that require a lot of thought or concentration, such as: ? Doing homework or job-related work. ? Watching TV. ? Working on the computer or phone. ? Playing memory games and puzzles.  Rest. Rest helps your brain heal. Make sure you: ? Get plenty of sleep. Most adults should get 7-9 hours of sleep   each night. ? Rest during the day. Take naps or rest breaks when you feel tired.  Avoid physical activity like exercise until your health care provider says it is safe. Stop any activity that worsens symptoms.  Do not do high-risk activities that could cause a second concussion, such as riding a bike or playing sports.  Ask your  health care provider when you can return to your normal activities, such as school, work, athletics, and driving. Your ability to react may be slower after a brain injury. Never do these activities if you are dizzy. Your health care provider will likely give you a plan for gradually returning to activities. General instructions   Take over-the-counter and prescription medicines only as told by your health care provider. Some medicines, such as blood thinners (anticoagulants) and aspirin, may increase the risk for complications, such as bleeding.  Do not drink alcohol until your health care provider says you can.  Watch your symptoms and tell others around you to do the same. Complications sometimes occur after a concussion. Older adults with a brain injury may have a higher risk of serious complications.  Tell your work manager, teachers, school nurse, school counselor, coach, or athletic trainer about your injury, symptoms, and restrictions.  Keep all follow-up visits as told by your health care provider. This is important. How is this prevented? Avoiding another brain injury is very important. In rare cases, another injury can lead to permanent brain damage, brain swelling, or death. The risk of this is greatest during the first 7-10 days after a head injury. Avoid injuries by:  Stopping activities that could lead to a second concussion, such as contact or recreational sports, until your health care provider says it is okay.  Taking these actions once you have returned to sports or activities: ? Avoiding plays or moves that can cause you to crash into another person. This is how most concussions occur. ? Following the rules and being respectful of other players. Do not engage in violent or illegal plays.  Getting regular exercise that includes strength and balance training.  Wearing a properly fitting helmet during sports, biking, or other activities. Helmets can help protect you from  serious skull and brain injuries, but they do not protect you from a concussion. Even when wearing a helmet, you should avoid being hit in the head. Contact a health care provider if:  Your symptoms get worse or they do not improve.  You have new symptoms.  You have another injury. Get help right away if:  You have severe or worsening headaches.  You have weakness or numbness in any part of your body.  You are confused.  Your coordination gets worse.  You vomit repeatedly.  You are sleepier than normal.  Your speech is slurred.  You cannot recognize people or places.  You have a seizure.  It is difficult to wake you up.  You have unusual behavior changes.  You have changes in your vision.  You lose consciousness. Summary  A concussion is a brain injury that results from a hard, direct hit (trauma) to your head or body.  You may have imaging tests and neuropsychological tests to diagnose a concussion.  Treatment for this condition includes physical and mental rest and careful observation.  Ask your health care provider when you can return to your normal activities, such as school, work, athletics, and driving.  Get help right away if you have a severe headache, weakness on one side of the   body, seizures, behavior changes, changes in vision, or if you are confused or sleepier than normal. This information is not intended to replace advice given to you by your health care provider. Make sure you discuss any questions you have with your health care provider. Document Revised: 10/18/2017 Document Reviewed: 10/18/2017 Elsevier Patient Education  2020 [employer]  Returning to Sports and Activities After a Concussion, Adult Knowing when to return to sports and activities after a concussion is important. Make sure you wait to return to activity until after both of these have occurred:  Your symptoms are completely gone.  A health care provider says it is  safe. Returning to activity too soon increases the risk of serious and long-lasting symptoms. You may also need to take time away from work or other activities that require concentration, depending on how severe your concussion is. Concussions can have serious effects on your brain. People who have more than one concussion are at greater risk of having long-term (chronic) headaches and problems with learning. When can I return to sports or other activities? You should stop participating in an activity right away after you hit your head or a concussion is suspected. You need to rest physically and mentally. You should also be monitored carefully by another adult. How quickly you can return to sports and other activities depends on:  Your age.  The severity of your concussion.  Your health before the injury.  Whether you have had a previous concussion. How should I gradually return to sports or other activities?  You should not return to sports or activities until you are symptom-free without medicine for at least 24 hours. Your health care provider will determine when your symptoms are completely gone and when it is safe for you to practice and play sports again. Make sure you return to sports gradually. Do not try to do too much too soon. Gradually advance through the following activity levels to return to sports: 1. Begin with only light aerobic activity to increase your heart rate. You may bike, walk, or jog for up to 10 minutes. Do not jump, run, or lift weights. 2. Get moderate physical activity with some head and body movements. Running short distances, fast jogging, using a stationary bike, and moderate-intensity weight lifting are okay. 3. Participate in high-intensity exercise without physical contact. 4. Return to your normal practice routine, which may include full contact. 5. Return to play in games, matches, or other competitions. Some people can progress quickly through these  levels. Other people will need several days to go from one level to the next. Do not move on to the next level until you have been symptom-free for at least 24 hours after doing activity at the previous level. Symptoms to watch for include:  Tiredness (fatigue).  Headache.  Problems with balance, coordination, or memory. If you notice any of these warning signs during exercise or other physical activities, rest for at least 24 hours or until the symptoms go away. You can then return to activity. Start at the activity level that you were on before your symptoms began. What symptoms are important to report to my health care provider? Concussion symptoms may not appear right away. They could also get worse at any time. It is important to let your health care provider know if you have any new or worsening symptoms. Watch for these symptoms: Physical symptoms  Headaches.  Dizziness and problems with coordination or balance.  Sensitivity to light or noise.    Nausea or vomiting.  Tiredness (fatigue).  Vision or hearing problems.  Seizure. Mental and emotional symptoms  Irritability or mood changes.  Memory problems.  Trouble concentrating, organizing, or making decisions.  Changes in eating or sleeping patterns.  Slowness in thinking, acting or reacting, speaking, or reading.  Anxiety or depression. Certain health issues may make your recovery from a concussion take longer. Let your health care provider know if you have a history of:  Migraines.  Depression.  Mood disorders.  Anxiety.  A developmental disorder, such as ADHD.  Any previous concussions. What are some questions to ask my health care provider? When you have a concussion, learning as much as you can about your injury can help you protect your long-term health. Ask your health care provider the following questions: Questions about recovery and treatment  Is it safe for me to return to sports or other physical  activities?  Should I limit how much time I watch TV, play video games, or use a computer?  Do I need to take time away from work?  Do I need more sleep than normal? Questions about the effects of a concussion  What are the short-term and long-term consequences of my injury?  How might the concussion affect my professional life?  Could I have problems with memory or learning? Questions about getting another concussion  When should I go to the emergency room?  What happens if I get another concussion?  What are the warning signs of another concussion?  Could I have a concussion without knowing it?  How can I prevent another concussion from happening?  Should I consider not playing sports anymore? Where to find more information  Centers for Disease Control and Prevention: www.cdc.gov Summary  Returning to activity too soon after a concussion increases the risk of serious and long-lasting symptoms.  After a concussion, you must rest physically and mentally until your symptoms are gone. Your health care provider will determine when it is safe for you to return to sports, work, and other activities that require concentration.  Returning to activity is a slow process, starting with limited activity and monitoring for symptoms.  If symptoms do not occur with activity, the intensity of activity can be increased.  Make sure you talk with your health care provider before returning to activity. This information is not intended to replace advice given to you by your health care provider. Make sure you discuss any questions you have with your health care provider. Document Revised: 10/09/2018 Document Reviewed: 10/09/2018 Elsevier Patient Education  2020 [employer] Inc.  

## 2019-08-14 ENCOUNTER — Other Ambulatory Visit: Payer: Self-pay | Admitting: Family Medicine

## 2019-08-14 DIAGNOSIS — Z760 Encounter for issue of repeat prescription: Secondary | ICD-10-CM

## 2019-08-14 DIAGNOSIS — I10 Essential (primary) hypertension: Secondary | ICD-10-CM

## 2019-08-15 ENCOUNTER — Other Ambulatory Visit: Payer: Self-pay | Admitting: Family Medicine

## 2019-08-15 DIAGNOSIS — I10 Essential (primary) hypertension: Secondary | ICD-10-CM

## 2019-08-15 DIAGNOSIS — Z760 Encounter for issue of repeat prescription: Secondary | ICD-10-CM

## 2019-08-15 NOTE — Telephone Encounter (Signed)
See Rx refill request.  

## 2019-08-29 ENCOUNTER — Other Ambulatory Visit: Payer: Self-pay | Admitting: Family Medicine

## 2019-08-29 DIAGNOSIS — I10 Essential (primary) hypertension: Secondary | ICD-10-CM

## 2019-08-29 DIAGNOSIS — Z760 Encounter for issue of repeat prescription: Secondary | ICD-10-CM

## 2019-09-11 ENCOUNTER — Other Ambulatory Visit: Payer: Self-pay | Admitting: Family Medicine

## 2019-09-11 DIAGNOSIS — Z6841 Body Mass Index (BMI) 40.0 and over, adult: Secondary | ICD-10-CM | POA: Diagnosis not present

## 2019-09-11 DIAGNOSIS — M1711 Unilateral primary osteoarthritis, right knee: Secondary | ICD-10-CM | POA: Diagnosis not present

## 2019-09-11 DIAGNOSIS — I10 Essential (primary) hypertension: Secondary | ICD-10-CM

## 2019-09-11 DIAGNOSIS — Z760 Encounter for issue of repeat prescription: Secondary | ICD-10-CM

## 2019-09-12 MED ORDER — FUROSEMIDE 40 MG PO TABS: 40.0000 mg | ORAL_TABLET | Freq: Two times a day (BID) | ORAL | 0 refills | Status: DC

## 2019-09-15 DIAGNOSIS — M1990 Unspecified osteoarthritis, unspecified site: Secondary | ICD-10-CM | POA: Insufficient documentation

## 2019-09-15 DIAGNOSIS — M25561 Pain in right knee: Secondary | ICD-10-CM | POA: Diagnosis not present

## 2019-09-15 DIAGNOSIS — Z955 Presence of coronary angioplasty implant and graft: Secondary | ICD-10-CM | POA: Insufficient documentation

## 2019-09-18 ENCOUNTER — Other Ambulatory Visit: Payer: Self-pay

## 2019-09-18 ENCOUNTER — Encounter: Payer: Self-pay | Admitting: Family Medicine

## 2019-09-18 ENCOUNTER — Ambulatory Visit: Payer: BC Managed Care – PPO | Admitting: Family Medicine

## 2019-09-18 ENCOUNTER — Other Ambulatory Visit (INDEPENDENT_AMBULATORY_CARE_PROVIDER_SITE_OTHER): Payer: BC Managed Care – PPO

## 2019-09-18 VITALS — BP 198/100 | HR 61 | Temp 97.8°F | Ht 74.0 in | Wt 389.0 lb

## 2019-09-18 DIAGNOSIS — E119 Type 2 diabetes mellitus without complications: Secondary | ICD-10-CM | POA: Diagnosis not present

## 2019-09-18 DIAGNOSIS — I2510 Atherosclerotic heart disease of native coronary artery without angina pectoris: Secondary | ICD-10-CM

## 2019-09-18 DIAGNOSIS — G4733 Obstructive sleep apnea (adult) (pediatric): Secondary | ICD-10-CM | POA: Diagnosis not present

## 2019-09-18 DIAGNOSIS — I10 Essential (primary) hypertension: Secondary | ICD-10-CM | POA: Diagnosis not present

## 2019-09-18 LAB — BASIC METABOLIC PANEL
BUN: 17 mg/dL (ref 6–23)
CO2: 29 mEq/L (ref 19–32)
Calcium: 9.4 mg/dL (ref 8.4–10.5)
Chloride: 104 mEq/L (ref 96–112)
Creatinine, Ser: 0.88 mg/dL (ref 0.40–1.50)
GFR: 109.77 mL/min (ref >60.00)
Glucose, Bld: 150 mg/dL — ABNORMAL HIGH (ref 70–99)
Potassium: 4 mEq/L (ref 3.5–5.1)
Sodium: 139 mEq/L (ref 135–145)

## 2019-09-18 LAB — TSH: TSH: 1.08 u[IU]/mL (ref 0.35–4.50)

## 2019-09-18 LAB — T4, FREE: Free T4: 1.1 ng/dL (ref 0.60–1.60)

## 2019-09-18 LAB — T3, FREE: T3, Free: 3.5 pg/mL (ref 2.3–4.2)

## 2019-09-18 LAB — HEMOGLOBIN A1C: Hgb A1c MFr Bld: 7.2 % — ABNORMAL HIGH (ref 4.6–6.5)

## 2019-09-18 MED ORDER — CLOPIDOGREL BISULFATE 75 MG PO TABS
75.0000 mg | ORAL_TABLET | Freq: Every day | ORAL | 0 refills | Status: DC
Start: 2019-09-18 — End: 2019-11-14

## 2019-09-18 MED ORDER — LOSARTAN POTASSIUM 50 MG PO TABS
50.0000 mg | ORAL_TABLET | Freq: Every day | ORAL | 3 refills | Status: DC
Start: 2019-09-18 — End: 2019-10-23

## 2019-09-18 NOTE — Addendum Note (Signed)
Addended by: Trellis Paganini D on: 09/18/2019 11:12 AM   Modules accepted: Orders

## 2019-09-18 NOTE — Progress Notes (Signed)
   Subjective:    Patient ID: Raymond Lutz, male    DOB: 1965/09/06, 54 y.o.   MRN: [id number]  HPI Here for elevated BP. He had been doing well with his BP at home averaging in the 130's systolic, but over the past few days he has had readings as high as the 190's systolic. At the same time his heart rates remain low, often in the 50's or 60's. Sometimes the rate drops to the high 40's in his sleep (he wears a [employer], [employer]). He feels fine, no chest pain or SOB, no headache or lightheadedness. His glucoses have been well controlled. He has known sleep apnea but he has used his CPAP machine in well over 10 years.    Review of Systems  Constitutional: Negative.   Respiratory: Negative.   Cardiovascular: Negative.   Neurological: Negative.        Objective:   Physical Exam Constitutional:      General: He is not in acute distress.    Appearance: Normal appearance. He is obese.  Cardiovascular:     Rate and Rhythm: Regular rhythm. Bradycardia present.     Pulses: Normal pulses.     Heart sounds: Normal heart sounds.  Pulmonary:     Effort: Pulmonary effort is normal.     Breath sounds: Normal breath sounds.  Musculoskeletal:     Right lower leg: No edema.     Left lower leg: No edema.  Neurological:     General: No focal deficit present.     Mental Status: He is alert and oriented to person, place, and time.           Assessment & Plan:  His HTN has recently been poorly controlled and he has been bradycardic. We will add Losartan 50 mg daily to his regimen, and we will arrange for him to see Dr. Mayford Knife asap. Get fasting labs today for renal function, A1c, and thyroid levels. We will refer him to Pulmonary to address the sleep apnea again.  Gershon Crane, MD

## 2019-09-19 ENCOUNTER — Telehealth: Payer: Self-pay | Admitting: Family Medicine

## 2019-09-19 NOTE — Telephone Encounter (Signed)
[facility] wants to be sure that the pt is stopping the lisinopril and only taking the losartan?   [facility] - [place], [place] - [address] Phone:  [phone]  Fax:  [phone]

## 2019-09-19 NOTE — Telephone Encounter (Signed)
Please advise 

## 2019-09-22 NOTE — Telephone Encounter (Signed)
No I want him to take BOTH Losartan and Lisinopril

## 2019-09-22 NOTE — Telephone Encounter (Signed)
Message routed to PCP CMA as I am no longer with provider.

## 2019-09-22 NOTE — Telephone Encounter (Signed)
Spoke with Pharmacist and she stated that they do not prescribe both bc its a contraindication and its basically the same medication and could increase cough.   Will route to PCP as well as inform in-person

## 2019-09-22 NOTE — Telephone Encounter (Signed)
Actually we are trying to lower his BP (he does NOT have a cough). Tell them to fill the Losartan and NOT fill the Lisinopril. He will follow up with Cardiology soon

## 2019-09-23 NOTE — Telephone Encounter (Signed)
Spoke witht he patient pharmacy. They are aware of the medication changes below and will notify the patient.

## 2019-09-29 ENCOUNTER — Encounter: Payer: Self-pay | Admitting: Family Medicine

## 2019-10-15 ENCOUNTER — Other Ambulatory Visit: Payer: Self-pay | Admitting: Family Medicine

## 2019-10-15 DIAGNOSIS — Z760 Encounter for issue of repeat prescription: Secondary | ICD-10-CM

## 2019-10-15 DIAGNOSIS — I10 Essential (primary) hypertension: Secondary | ICD-10-CM

## 2019-10-15 MED ORDER — FUROSEMIDE 40 MG PO TABS: 40.0000 mg | ORAL_TABLET | Freq: Two times a day (BID) | ORAL | 0 refills | Status: DC

## 2019-10-20 ENCOUNTER — Encounter: Payer: Self-pay | Admitting: Family Medicine

## 2019-10-20 ENCOUNTER — Ambulatory Visit: Payer: BC Managed Care – PPO | Admitting: Family Medicine

## 2019-10-20 ENCOUNTER — Other Ambulatory Visit: Payer: Self-pay

## 2019-10-20 VITALS — BP 140/80 | HR 65 | Temp 98.1°F | Wt 385.6 lb

## 2019-10-20 DIAGNOSIS — I10 Essential (primary) hypertension: Secondary | ICD-10-CM | POA: Diagnosis not present

## 2019-10-20 DIAGNOSIS — I2510 Atherosclerotic heart disease of native coronary artery without angina pectoris: Secondary | ICD-10-CM | POA: Diagnosis not present

## 2019-10-20 DIAGNOSIS — R001 Bradycardia, unspecified: Secondary | ICD-10-CM | POA: Diagnosis not present

## 2019-10-20 DIAGNOSIS — R011 Cardiac murmur, unspecified: Secondary | ICD-10-CM | POA: Diagnosis not present

## 2019-10-20 MED ORDER — METOPROLOL TARTRATE 50 MG PO TABS: 25.0000 mg | ORAL_TABLET | Freq: Two times a day (BID) | ORAL | 2 refills | Status: DC

## 2019-10-20 NOTE — Progress Notes (Signed)
   Subjective:    Patient ID: Raymond Lutz, male    DOB: 09-14-1965, 54 y.o.   MRN: [id number]  HPI Here to follow up on HTN and bradycardia. On 09-18-19 his BP had been running quite high, up to the 180s systolic, so we stopped Lisinopril and began Losartan 50 mg daily. His BP has responded nicely and his pressures at home have been 130s over 80s lately. However his heart rates have been quite low, often in the 50s and 60s during the day. Since our last visit he has begun to cut his evening dose of Metorpolol in half. However he still has spells of lightheadedness that come and go. No chest pain or SOB. He has committed himself to losing weight, and since our last visit he has lost 10 lbs.    Review of Systems  Constitutional: Negative.   Respiratory: Negative.   Cardiovascular: Negative.   Neurological: Positive for light-headedness.       Objective:   Physical Exam Constitutional:      Appearance: Normal appearance.  Cardiovascular:     Rate and Rhythm: Regular rhythm. Bradycardia present.     Pulses: Normal pulses.     Comments: He has a 2/6 SM today which I have never heard before.  Neurological:     Mental Status: He is alert.           Assessment & Plan:  He is having spells of lightheadedness and bradycardia, though his BP is under better control now. He also has a new murmur (his last ECHO in 2019 did not show any valvular diease). He is scheduled to see Dr. Mayford Knife, his cardiologist, on 10-23-19. We will decrease the Metoprolol to 1/2 tab (25 mg) BID. He likely will need another ECHO to evaluate this murmur.  Gershon Crane, MD

## 2019-10-23 ENCOUNTER — Other Ambulatory Visit: Payer: Self-pay

## 2019-10-23 ENCOUNTER — Encounter: Payer: Self-pay | Admitting: Cardiology

## 2019-10-23 ENCOUNTER — Encounter: Payer: Self-pay | Admitting: Family Medicine

## 2019-10-23 ENCOUNTER — Ambulatory Visit: Payer: BC Managed Care – PPO | Admitting: Cardiology

## 2019-10-23 VITALS — BP 140/82 | HR 60 | Ht 74.0 in | Wt 379.2 lb

## 2019-10-23 DIAGNOSIS — E785 Hyperlipidemia, unspecified: Secondary | ICD-10-CM

## 2019-10-23 DIAGNOSIS — E119 Type 2 diabetes mellitus without complications: Secondary | ICD-10-CM | POA: Diagnosis not present

## 2019-10-23 DIAGNOSIS — I10 Essential (primary) hypertension: Secondary | ICD-10-CM

## 2019-10-23 DIAGNOSIS — I2510 Atherosclerotic heart disease of native coronary artery without angina pectoris: Secondary | ICD-10-CM | POA: Diagnosis not present

## 2019-10-23 MED ORDER — LOSARTAN POTASSIUM 100 MG PO TABS
100.0000 mg | ORAL_TABLET | Freq: Every day | ORAL | 3 refills | Status: DC
Start: 2019-10-23 — End: 2020-10-26

## 2019-10-23 MED ORDER — METOPROLOL TARTRATE 25 MG PO TABS
12.5000 mg | ORAL_TABLET | Freq: Two times a day (BID) | ORAL | 3 refills | Status: DC
Start: 2019-10-23 — End: 2019-11-14
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
## 2019-10-23 NOTE — Patient Instructions (Signed)
Medication Instructions:  Your physician has recommended you make the following change in your medication:  1) DECREASE metoprolol to 12.5 mg twice daily  2) INCREASE losartan to 100 mg daily  *If you need a refill on your cardiac medications before your next appointment, please call your pharmacy*  Lab Work: Fasting lipids and CMET on 08/19 between 7:30am and 4:30pm If you have labs (blood work) drawn today and your tests are completely normal, you will receive your results only by: Marland Kitchen MyChart Message (if you have MyChart) OR . A paper copy in the mail If you have any lab test that is abnormal or we need to change your treatment, we will call you to review the results.  Follow-Up: At [facility], you and your health needs are our priority.  As part of our continuing mission to provide you with exceptional heart care, we have created designated Provider Care Teams.  These Care Teams include your primary Cardiologist (physician) and Advanced Practice Providers (APPs -  Physician Assistants and Nurse Practitioners) who all work together to provide you with the care you need, when you need it.  Your next appointment:   1 year(s)  The format for your next appointment:   In Person  Provider:   You may see Armanda Magic, MD or one of the following Advanced Practice Providers on your designated Care Team:    Ronie Spies, PA-C  Jacolyn Reedy, PA-C

## 2019-10-23 NOTE — Addendum Note (Signed)
Addended by: Theresia Majors on: 10/23/2019 04:26 PM   Modules accepted: Orders

## 2019-10-24 ENCOUNTER — Telehealth: Payer: Self-pay | Admitting: Family Medicine

## 2019-10-24 NOTE — Telephone Encounter (Signed)
error 

## 2019-10-28 NOTE — Telephone Encounter (Signed)
Set up an OV to discuss this  

## 2019-10-30 ENCOUNTER — Other Ambulatory Visit: Payer: BC Managed Care – PPO | Admitting: *Deleted

## 2019-10-30 ENCOUNTER — Other Ambulatory Visit: Payer: Self-pay

## 2019-10-30 DIAGNOSIS — E785 Hyperlipidemia, unspecified: Secondary | ICD-10-CM

## 2019-10-30 DIAGNOSIS — E119 Type 2 diabetes mellitus without complications: Secondary | ICD-10-CM | POA: Diagnosis not present

## 2019-10-30 DIAGNOSIS — I2510 Atherosclerotic heart disease of native coronary artery without angina pectoris: Secondary | ICD-10-CM

## 2019-10-30 DIAGNOSIS — I10 Essential (primary) hypertension: Secondary | ICD-10-CM | POA: Diagnosis not present

## 2019-10-30 LAB — COMPREHENSIVE METABOLIC PANEL
ALT: 18 IU/L (ref 0–44)
AST: 17 IU/L (ref 0–40)
Albumin/Globulin Ratio: 1.6 (ref 1.2–2.2)
Albumin: 4.2 g/dL (ref 3.8–4.9)
Alkaline Phosphatase: 66 IU/L (ref 48–121)
BUN/Creatinine Ratio: 23 — ABNORMAL HIGH (ref 9–20)
BUN: 19 mg/dL (ref 6–24)
Bilirubin Total: 0.4 mg/dL (ref 0.0–1.2)
CO2: 25 mmol/L (ref 20–29)
Calcium: 9.3 mg/dL (ref 8.7–10.2)
Chloride: 105 mmol/L (ref 96–106)
Creatinine, Ser: 0.83 mg/dL (ref 0.76–1.27)
GFR calc Af Amer: 117 mL/min/{1.73_m2} (ref >59)
GFR calc non Af Amer: 101 mL/min/{1.73_m2} (ref >59)
Globulin, Total: 2.7 g/dL (ref 1.5–4.5)
Glucose: 128 mg/dL — ABNORMAL HIGH (ref 65–99)
Potassium: 4.1 mmol/L (ref 3.5–5.2)
Sodium: 142 mmol/L (ref 134–144)
Total Protein: 6.9 g/dL (ref 6.0–8.5)

## 2019-10-30 LAB — LIPID PANEL
Chol/HDL Ratio: 3.4 ratio (ref 0.0–5.0)
Cholesterol, Total: 118 mg/dL (ref 100–199)
HDL: 35 mg/dL — ABNORMAL LOW (ref >39)
LDL Chol Calc (NIH): 73 mg/dL (ref 0–99)
Triglycerides: 42 mg/dL (ref 0–149)
VLDL Cholesterol Cal: 10 mg/dL (ref 5–40)

## 2019-11-03 ENCOUNTER — Ambulatory Visit: Payer: BC Managed Care – PPO | Admitting: Student

## 2019-11-11 ENCOUNTER — Telehealth: Payer: Self-pay | Admitting: Cardiology

## 2019-11-11 DIAGNOSIS — Z20822 Contact with and (suspected) exposure to covid-19: Secondary | ICD-10-CM | POA: Diagnosis not present

## 2019-11-11 DIAGNOSIS — Z03818 Encounter for observation for suspected exposure to other biological agents ruled out: Secondary | ICD-10-CM | POA: Diagnosis not present

## 2019-11-11 NOTE — Telephone Encounter (Signed)
Called patient back about his message. Patient has been having chest pressure since earlier this morning around 3:00 am. Patient stated he took a nitro and his symptoms got better. Patient is also concerned because he was exposed to someone with COVID last week. Patient is currently waiting in line to get a rapid test for COVID. Made patient an appointment to see Dr. Mayford Knife tomorrow. Informed patient if his test is positive he will need to have a virtual visit with Dr. Mayford Knife instead. Encouraged patient to go to the ED if his symptoms get worse or do not improve with nitro.  Patient verbalized understanding.

## 2019-11-11 NOTE — Telephone Encounter (Signed)
   Pt c/o of Chest Pain: STAT if CP now or developed within 24 hours  1. Are you having CP right now? Dull pain, more like  pressure on his chest  2. Are you experiencing any other symptoms (ex. SOB, nausea, vomiting, sweating) only when the chest pressure accrued   3. How long have you been experiencing CP? Last night  4. Is your CP continuous or coming and going? Consistent dull pain  5. Have you taken Nitroglycerin? Yes, one nitro last night   Woke up last night with CP, consistent dull pain, he said he may have exposed with someone with covid, might be one of the symptoms.

## 2019-11-12 ENCOUNTER — Inpatient Hospital Stay (HOSPITAL_COMMUNITY)
Admission: EM | Admit: 2019-11-12 | Discharge: 2019-11-14 | DRG: 247 | Disposition: A | Discharge status: Home / Self Care | Payer: BC Managed Care – PPO | Attending: Cardiology | Admitting: Cardiology

## 2019-11-12 ENCOUNTER — Encounter: Payer: Self-pay | Admitting: Cardiology

## 2019-11-12 ENCOUNTER — Emergency Department (HOSPITAL_COMMUNITY): Payer: BC Managed Care – PPO

## 2019-11-12 ENCOUNTER — Other Ambulatory Visit: Payer: Self-pay

## 2019-11-12 ENCOUNTER — Encounter: Payer: BC Managed Care – PPO | Admitting: Cardiology

## 2019-11-12 ENCOUNTER — Encounter (HOSPITAL_COMMUNITY): Payer: Self-pay

## 2019-11-12 ENCOUNTER — Telehealth (INDEPENDENT_AMBULATORY_CARE_PROVIDER_SITE_OTHER): Payer: BC Managed Care – PPO | Admitting: Cardiology

## 2019-11-12 VITALS — BP 154/82 | HR 78 | Ht 74.0 in | Wt 379.2 lb

## 2019-11-12 VITALS — BP 154/82 | HR 78 | Ht 74.0 in | Wt 379.0 lb

## 2019-11-12 DIAGNOSIS — N529 Male erectile dysfunction, unspecified: Secondary | ICD-10-CM | POA: Diagnosis present

## 2019-11-12 DIAGNOSIS — K219 Gastro-esophageal reflux disease without esophagitis: Secondary | ICD-10-CM | POA: Diagnosis not present

## 2019-11-12 DIAGNOSIS — I10 Essential (primary) hypertension: Secondary | ICD-10-CM | POA: Diagnosis not present

## 2019-11-12 DIAGNOSIS — E1151 Type 2 diabetes mellitus with diabetic peripheral angiopathy without gangrene: Secondary | ICD-10-CM | POA: Diagnosis not present

## 2019-11-12 DIAGNOSIS — I2583 Coronary atherosclerosis due to lipid rich plaque: Secondary | ICD-10-CM | POA: Diagnosis not present

## 2019-11-12 DIAGNOSIS — R079 Chest pain, unspecified: Secondary | ICD-10-CM

## 2019-11-12 DIAGNOSIS — G4733 Obstructive sleep apnea (adult) (pediatric): Secondary | ICD-10-CM | POA: Diagnosis present

## 2019-11-12 DIAGNOSIS — M170 Bilateral primary osteoarthritis of knee: Secondary | ICD-10-CM | POA: Diagnosis present

## 2019-11-12 DIAGNOSIS — J45909 Unspecified asthma, uncomplicated: Secondary | ICD-10-CM | POA: Diagnosis not present

## 2019-11-12 DIAGNOSIS — I480 Paroxysmal atrial fibrillation: Secondary | ICD-10-CM | POA: Diagnosis not present

## 2019-11-12 DIAGNOSIS — M109 Gout, unspecified: Secondary | ICD-10-CM | POA: Diagnosis not present

## 2019-11-12 DIAGNOSIS — I2510 Atherosclerotic heart disease of native coronary artery without angina pectoris: Secondary | ICD-10-CM | POA: Diagnosis present

## 2019-11-12 DIAGNOSIS — Z833 Family history of diabetes mellitus: Secondary | ICD-10-CM

## 2019-11-12 DIAGNOSIS — I214 Non-ST elevation (NSTEMI) myocardial infarction: Principal | ICD-10-CM | POA: Diagnosis present

## 2019-11-12 DIAGNOSIS — R072 Precordial pain: Secondary | ICD-10-CM

## 2019-11-12 DIAGNOSIS — Z7982 Long term (current) use of aspirin: Secondary | ICD-10-CM

## 2019-11-12 DIAGNOSIS — E785 Hyperlipidemia, unspecified: Secondary | ICD-10-CM

## 2019-11-12 DIAGNOSIS — Z87442 Personal history of urinary calculi: Secondary | ICD-10-CM | POA: Diagnosis not present

## 2019-11-12 DIAGNOSIS — Z7902 Long term (current) use of antithrombotics/antiplatelets: Secondary | ICD-10-CM

## 2019-11-12 DIAGNOSIS — Z20822 Contact with and (suspected) exposure to covid-19: Secondary | ICD-10-CM | POA: Diagnosis present

## 2019-11-12 DIAGNOSIS — E7800 Pure hypercholesterolemia, unspecified: Secondary | ICD-10-CM | POA: Diagnosis not present

## 2019-11-12 DIAGNOSIS — Z8249 Family history of ischemic heart disease and other diseases of the circulatory system: Secondary | ICD-10-CM

## 2019-11-12 DIAGNOSIS — Z79899 Other long term (current) drug therapy: Secondary | ICD-10-CM | POA: Diagnosis not present

## 2019-11-12 DIAGNOSIS — Z955 Presence of coronary angioplasty implant and graft: Secondary | ICD-10-CM

## 2019-11-12 LAB — TROPONIN I (HIGH SENSITIVITY)
Troponin I (High Sensitivity): 347 ng/L (ref <18)
Troponin I (High Sensitivity): 362 ng/L (ref <18)

## 2019-11-12 LAB — CBC
HCT: 44.8 % (ref 39.0–52.0)
Hemoglobin: 14.1 g/dL (ref 13.0–17.0)
MCH: 26.9 pg (ref 26.0–34.0)
MCHC: 31.5 g/dL (ref 30.0–36.0)
MCV: 85.5 fL (ref 80.0–100.0)
Platelets: 296 10*3/uL (ref 150–400)
RBC: 5.24 MIL/uL (ref 4.22–5.81)
RDW: 14.6 % (ref 11.5–15.5)
WBC: 5.2 10*3/uL (ref 4.0–10.5)
nRBC: 0 % (ref 0.0–0.2)

## 2019-11-12 LAB — BASIC METABOLIC PANEL
Anion gap: 9 (ref 5–15)
BUN: 14 mg/dL (ref 6–20)
CO2: 26 mmol/L (ref 22–32)
Calcium: 9.4 mg/dL (ref 8.9–10.3)
Chloride: 104 mmol/L (ref 98–111)
Creatinine, Ser: 0.89 mg/dL (ref 0.61–1.24)
GFR calc Af Amer: >60 mL/min (ref >60)
GFR calc non Af Amer: >60 mL/min (ref >60)
Glucose, Bld: 142 mg/dL — ABNORMAL HIGH (ref 70–99)
Potassium: 3.4 mmol/L — ABNORMAL LOW (ref 3.5–5.1)
Sodium: 139 mmol/L (ref 135–145)

## 2019-11-12 LAB — TROPONIN T: Troponin T (Highly Sensitive): 128 ng/L (ref 0–22)

## 2019-11-12 MED ORDER — ONDANSETRON HCL 4 MG/2ML IJ SOLN: 4.0000 mg | Freq: Four times a day (QID) | INTRAMUSCULAR | Status: DC | PRN

## 2019-11-12 MED ORDER — INSULIN ASPART 100 UNIT/ML ~~LOC~~ SOLN
0.0000 [IU] | Freq: Three times a day (TID) | SUBCUTANEOUS | Status: DC
  Administered 2019-11-13: 2 [IU] via SUBCUTANEOUS
  Administered 2019-11-13: 3 [IU] via SUBCUTANEOUS
  Administered 2019-11-14: 2 [IU] via SUBCUTANEOUS

## 2019-11-12 MED ORDER — ASPIRIN EC 81 MG PO TBEC
81.0000 mg | DELAYED_RELEASE_TABLET | Freq: Every day | ORAL | Status: DC
  Administered 2019-11-14: 81 mg via ORAL
  Filled 2019-11-12: qty 1

## 2019-11-12 MED ORDER — METOPROLOL TARTRATE 5 MG/5ML IV SOLN
5.0000 mg | Freq: Once | INTRAVENOUS | Status: AC
  Administered 2019-11-12: 5 mg via INTRAVENOUS
  Filled 2019-11-12: qty 5

## 2019-11-12 MED ORDER — ASPIRIN 81 MG PO CHEW
324.0000 mg | CHEWABLE_TABLET | Freq: Once | ORAL | Status: AC
  Administered 2019-11-12: 324 mg via ORAL
  Filled 2019-11-12: qty 4

## 2019-11-12 MED ORDER — AMLODIPINE BESYLATE 5 MG PO TABS
7.5000 mg | ORAL_TABLET | Freq: Every day | ORAL | Status: DC
  Administered 2019-11-13 – 2019-11-14 (×2): 7.5 mg via ORAL
  Filled 2019-11-12 (×2): qty 1

## 2019-11-12 MED ORDER — ASPIRIN 300 MG RE SUPP: 300.0000 mg | RECTAL | Status: DC

## 2019-11-12 MED ORDER — ASPIRIN 81 MG PO CHEW: 324.0000 mg | CHEWABLE_TABLET | ORAL | Status: DC

## 2019-11-12 MED ORDER — EZETIMIBE 10 MG PO TABS
10.0000 mg | ORAL_TABLET | Freq: Every day | ORAL | Status: DC
  Administered 2019-11-13 – 2019-11-14 (×2): 10 mg via ORAL
  Filled 2019-11-12 (×3): qty 1

## 2019-11-12 MED ORDER — ALBUTEROL SULFATE HFA 108 (90 BASE) MCG/ACT IN AERS
2.0000 | INHALATION_SPRAY | RESPIRATORY_TRACT | Status: DC | PRN
  Filled 2019-11-12: qty 6.7

## 2019-11-12 MED ORDER — AMLODIPINE BESYLATE 5 MG PO TABS
7.5000 mg | ORAL_TABLET | Freq: Every day | ORAL | 3 refills | Status: DC
Start: 2019-11-12 — End: 2019-11-14

## 2019-11-12 MED ORDER — INSULIN ASPART 100 UNIT/ML ~~LOC~~ SOLN: 0.0000 [IU] | Freq: Every day | SUBCUTANEOUS | Status: DC

## 2019-11-12 MED ORDER — NITROGLYCERIN 0.4 MG SL SUBL: 0.4000 mg | SUBLINGUAL_TABLET | SUBLINGUAL | Status: DC | PRN

## 2019-11-12 MED ORDER — METOPROLOL TARTRATE 12.5 MG HALF TABLET
12.5000 mg | ORAL_TABLET | Freq: Two times a day (BID) | ORAL | Status: DC
  Administered 2019-11-13 – 2019-11-14 (×4): 12.5 mg via ORAL
  Filled 2019-11-12 (×4): qty 1

## 2019-11-12 MED ORDER — HEPARIN BOLUS VIA INFUSION
4000.0000 [IU] | Freq: Once | INTRAVENOUS | Status: AC
  Administered 2019-11-12: 4000 [IU] via INTRAVENOUS
  Filled 2019-11-12: qty 4000

## 2019-11-12 MED ORDER — ATORVASTATIN CALCIUM 80 MG PO TABS
80.0000 mg | ORAL_TABLET | Freq: Every day | ORAL | Status: DC
  Administered 2019-11-13 – 2019-11-14 (×2): 80 mg via ORAL
  Filled 2019-11-12 (×2): qty 1

## 2019-11-12 MED ORDER — ACETAMINOPHEN 325 MG PO TABS: 650.0000 mg | ORAL_TABLET | ORAL | Status: DC | PRN

## 2019-11-12 MED ORDER — HEPARIN (PORCINE) 25000 UT/250ML-% IV SOLN
1650.0000 [IU]/h | INTRAVENOUS | Status: DC
  Administered 2019-11-12: 1500 [IU]/h via INTRAVENOUS
  Filled 2019-11-12: qty 250

## 2019-11-12 MED ORDER — CLOPIDOGREL BISULFATE 75 MG PO TABS
75.0000 mg | ORAL_TABLET | Freq: Every day | ORAL | Status: DC
  Administered 2019-11-13 – 2019-11-14 (×2): 75 mg via ORAL
  Filled 2019-11-12 (×2): qty 1

## 2019-11-12 MED ORDER — NITROGLYCERIN 0.4 MG SL SUBL
0.4000 mg | SUBLINGUAL_TABLET | Freq: Once | SUBLINGUAL | Status: AC
  Administered 2019-11-12: 0.4 mg via SUBLINGUAL
  Filled 2019-11-12: qty 1

## 2019-11-12 MED ORDER — ISOSORBIDE MONONITRATE ER 30 MG PO TB24
30.0000 mg | ORAL_TABLET | Freq: Every day | ORAL | Status: DC
  Administered 2019-11-13 – 2019-11-14 (×2): 30 mg via ORAL
  Filled 2019-11-12 (×2): qty 1

## 2019-11-12 NOTE — Telephone Encounter (Signed)
Patient is returning call. He states when speaking with the nurse the call got disconnected. However, she states his COVID test results are negative. He also wants to make Dr. Mayford Knife aware that he is vaccinated.

## 2019-11-12 NOTE — Addendum Note (Signed)
Addended by: Theresia Majors on: 11/12/2019 10:37 AM   Modules accepted: Orders

## 2019-11-12 NOTE — ED Triage Notes (Signed)
Pt arrives POV for eval of elevated troponins. Pt reports he went to his cards MD this afternoon and she called him back and advised that his trops were elevated and he needed to come in. Pt is pain free at this time.
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
## 2019-11-12 NOTE — Progress Notes (Signed)
This encounter was created in error - please disregard.

## 2019-11-12 NOTE — Telephone Encounter (Signed)
Left message for patient to call back in regards to his symptoms/COVID test results. Also need to know when he was exposed to COVID to see whether his appointment needs to be switched to virtual.
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
## 2019-11-12 NOTE — Patient Instructions (Signed)
Medication Instructions:  Your physician recommends that you continue on your current medications as directed. Please refer to the Current Medication list given to you today.  *If you need a refill on your cardiac medications before your next appointment, please call your pharmacy*   Lab Work: TODAY: Stat Troponin If you have labs (blood work) drawn today and your tests are completely normal, you will receive your results only by: Marland Kitchen MyChart Message (if you have MyChart) OR . A paper copy in the mail If you have any lab test that is abnormal or we need to change your treatment, we will call you to review the results.   Testing/Procedures: Your physician has requested that you have a lexiscan myoview. For further information please visit [email or web address] Please follow instruction sheet, as given.  Follow-Up: At [facility], you and your health needs are our priority.  As part of our continuing mission to provide you with exceptional heart care, we have created designated Provider Care Teams.  These Care Teams include your primary Cardiologist (physician) and Advanced Practice Providers (APPs -  Physician Assistants and Nurse Practitioners) who all work together to provide you with the care you need, when you need it.
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
## 2019-11-12 NOTE — Progress Notes (Signed)
ANTICOAGULATION CONSULT NOTE - Initial Consult  Pharmacy Consult for heparin Indication: chest pain/ACS  No Known Allergies  Patient Measurements: Height: 6\' 2"  (188 cm) Weight: (!) 171.5 kg (378 lb) IBW/kg (Calculated) : 82.2 Heparin Dosing Weight: 123.4kg  Vital Signs: Temp: 98.6 F (37 C) (09/01 2100) Temp Source: Oral (09/01 2100) BP: 156/76 (09/01 2100) Pulse Rate: 73 (09/01 2100)  Labs: Recent Labs    11/12/19 1938  HGB 14.1  HCT 44.8  PLT 296  CREATININE 0.89  TROPONINIHS 347*    Estimated Creatinine Clearance: 161.9 mL/min (by C-G formula based on SCr of 0.89 mg/dL).   Medical History: Past Medical History:  Diagnosis Date  . Arthritis    "knees" (02/21/2018)  . Asthma   . 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  . Chest pain 03/18/2014  . Coronary artery disease    a. 2013: cath showing nonobstructive CAD with 50% OM, 20-30% dLAD, 30-40%dRCA  b. 12/2015: NSTEMI w/ 99% stenosis dLAD (DES placed), 99% stenosis 2nd Mrg (DES placed), apical LAD stenosis too small for PCI and mild disease in the RCA.   01/2016 DIABETES MELLITUS, TYPE II 10/17/2006  . ED (erectile dysfunction)   . GERD (gastroesophageal reflux disease)   . History of gout   . History of kidney stones   . HYPERLIPIDEMIA 10/17/2006  . HYPERTENSION 10/17/2006  . NSTEMI (non-ST elevated myocardial infarction) ([facility]) 12/16/2015; 02/19/2018  . OBESITY 10/17/2006  . OSA on CPAP    not using CPAP regularly"need a new one" (02/21/2018)  . Paroxysmal atrial fibrillation ([facility])    2006 - was on coumadin - took himself off 6 mos after initiation.  . Shortness of breath     Medications:  Infusions:  . heparin      Assessment: 53 yom presented to the ED with CP and elevated troponins. To start IV heparin. Baseline CBC is WNL and he is not on anticoagulation PTA.   Goal of Therapy:  Heparin level 0.3-0.7 units/ml Monitor platelets by anticoagulation protocol: Yes   Plan:  Heparin bolus 4000 units IV x 1 Heparin gtt 1500  units/hr Check a 6 hr heparin level Daily heparin level and CBC  Sharetta Ricchio, 2007 11/12/2019,9:37 PM

## 2019-11-13 ENCOUNTER — Inpatient Hospital Stay (HOSPITAL_COMMUNITY): Payer: BC Managed Care – PPO

## 2019-11-13 ENCOUNTER — Encounter (HOSPITAL_COMMUNITY): Payer: Self-pay | Admitting: Cardiovascular Disease

## 2019-11-13 ENCOUNTER — Inpatient Hospital Stay (HOSPITAL_COMMUNITY)
Admission: EM | Disposition: A | Discharge status: Home / Self Care | Payer: Self-pay | Source: Home / Self Care | Attending: Cardiology

## 2019-11-13 DIAGNOSIS — I214 Non-ST elevation (NSTEMI) myocardial infarction: Secondary | ICD-10-CM

## 2019-11-13 DIAGNOSIS — E7800 Pure hypercholesterolemia, unspecified: Secondary | ICD-10-CM | POA: Insufficient documentation

## 2019-11-13 DIAGNOSIS — I10 Essential (primary) hypertension: Secondary | ICD-10-CM

## 2019-11-13 DIAGNOSIS — G4733 Obstructive sleep apnea (adult) (pediatric): Secondary | ICD-10-CM

## 2019-11-13 HISTORY — PX: LEFT HEART CATH AND CORONARY ANGIOGRAPHY: CATH118249

## 2019-11-13 LAB — POCT ACTIVATED CLOTTING TIME
Activated Clotting Time: 263 seconds
Activated Clotting Time: 285 seconds
Activated Clotting Time: 318 seconds

## 2019-11-13 LAB — CBG MONITORING, ED
Glucose-Capillary: 101 mg/dL — ABNORMAL HIGH (ref 70–99)
Glucose-Capillary: 123 mg/dL — ABNORMAL HIGH (ref 70–99)

## 2019-11-13 LAB — BRAIN NATRIURETIC PEPTIDE: B Natriuretic Peptide: 12.6 pg/mL (ref 0.0–100.0)

## 2019-11-13 LAB — GLUCOSE, CAPILLARY
Glucose-Capillary: 157 mg/dL — ABNORMAL HIGH (ref 70–99)
Glucose-Capillary: 96 mg/dL (ref 70–99)

## 2019-11-13 LAB — ECHOCARDIOGRAM COMPLETE
Height: 74 in
S' Lateral: 3.7 cm
Weight: 6048 oz

## 2019-11-13 LAB — TROPONIN I (HIGH SENSITIVITY)
Troponin I (High Sensitivity): 419 ng/L (ref <18)
Troponin I (High Sensitivity): 417 ng/L (ref <18)

## 2019-11-13 LAB — BASIC METABOLIC PANEL
Anion gap: 9 (ref 5–15)
BUN: 17 mg/dL (ref 6–20)
CO2: 24 mmol/L (ref 22–32)
Calcium: 9 mg/dL (ref 8.9–10.3)
Chloride: 106 mmol/L (ref 98–111)
Creatinine, Ser: 0.84 mg/dL (ref 0.61–1.24)
GFR calc Af Amer: >60 mL/min (ref >60)
GFR calc non Af Amer: >60 mL/min (ref >60)
Glucose, Bld: 109 mg/dL — ABNORMAL HIGH (ref 70–99)
Potassium: 3.6 mmol/L (ref 3.5–5.1)
Sodium: 139 mmol/L (ref 135–145)

## 2019-11-13 LAB — SARS CORONAVIRUS 2 BY RT PCR (HOSPITAL ORDER, PERFORMED IN ~~LOC~~ HOSPITAL LAB): SARS Coronavirus 2: NEGATIVE

## 2019-11-13 LAB — HEPARIN LEVEL (UNFRACTIONATED): Heparin Unfractionated: 0.3 IU/mL (ref 0.30–0.70)

## 2019-11-13 LAB — MAGNESIUM: Magnesium: 2.1 mg/dL (ref 1.7–2.4)

## 2019-11-13 SURGERY — LEFT HEART CATH AND CORONARY ANGIOGRAPHY
Anesthesia: LOCAL

## 2019-11-13 MED ORDER — CLOPIDOGREL BISULFATE 75 MG PO TABS
ORAL_TABLET | ORAL | Status: AC
  Filled 2019-11-13: qty 1

## 2019-11-13 MED ORDER — LABETALOL HCL 5 MG/ML IV SOLN: 10.0000 mg | INTRAVENOUS | Status: AC | PRN

## 2019-11-13 MED ORDER — ASPIRIN 81 MG PO CHEW: 81.0000 mg | CHEWABLE_TABLET | ORAL | Status: DC

## 2019-11-13 MED ORDER — NITROGLYCERIN 1 MG/10 ML FOR IR/CATH LAB
INTRA_ARTERIAL | Status: DC | PRN
  Administered 2019-11-13 (×3): 200 ug via INTRACORONARY

## 2019-11-13 MED ORDER — SODIUM CHLORIDE 0.9 % IV SOLN: 250.0000 mL | INTRAVENOUS | Status: DC | PRN

## 2019-11-13 MED ORDER — CLOPIDOGREL BISULFATE 300 MG PO TABS
ORAL_TABLET | ORAL | Status: DC | PRN
  Administered 2019-11-13: 75 mg via ORAL

## 2019-11-13 MED ORDER — SODIUM CHLORIDE 0.9 % IV SOLN: INTRAVENOUS | Status: DC

## 2019-11-13 MED ORDER — VERAPAMIL HCL 2.5 MG/ML IV SOLN
INTRAVENOUS | Status: DC | PRN
  Administered 2019-11-13: 10 mL via INTRA_ARTERIAL

## 2019-11-13 MED ORDER — ALBUTEROL SULFATE (2.5 MG/3ML) 0.083% IN NEBU: 2.5000 mg | INHALATION_SOLUTION | RESPIRATORY_TRACT | Status: DC | PRN

## 2019-11-13 MED ORDER — ASPIRIN 81 MG PO CHEW: 81.0000 mg | CHEWABLE_TABLET | Freq: Every day | ORAL | Status: DC

## 2019-11-13 MED ORDER — HEPARIN (PORCINE) IN NACL 1000-0.9 UT/500ML-% IV SOLN
INTRAVENOUS | Status: AC
  Filled 2019-11-13: qty 1000

## 2019-11-13 MED ORDER — SODIUM CHLORIDE 0.9% FLUSH
3.0000 mL | Freq: Two times a day (BID) | INTRAVENOUS | Status: DC
  Administered 2019-11-14: 3 mL via INTRAVENOUS

## 2019-11-13 MED ORDER — IOHEXOL 350 MG/ML SOLN
INTRAVENOUS | Status: DC | PRN
  Administered 2019-11-13: 190 mL

## 2019-11-13 MED ORDER — HEPARIN SODIUM (PORCINE) 1000 UNIT/ML IJ SOLN
INTRAMUSCULAR | Status: AC
  Filled 2019-11-13: qty 1

## 2019-11-13 MED ORDER — HEPARIN SODIUM (PORCINE) 1000 UNIT/ML IJ SOLN
INTRAMUSCULAR | Status: DC | PRN
  Administered 2019-11-13: 8500 [IU] via INTRAVENOUS
  Administered 2019-11-13 (×2): 1500 [IU] via INTRAVENOUS
  Administered 2019-11-13: 8000 [IU] via INTRAVENOUS

## 2019-11-13 MED ORDER — ATORVASTATIN CALCIUM 80 MG PO TABS: 80.0000 mg | ORAL_TABLET | Freq: Every day | ORAL | Status: DC

## 2019-11-13 MED ORDER — ASPIRIN 81 MG PO CHEW
81.0000 mg | CHEWABLE_TABLET | ORAL | Status: AC
  Administered 2019-11-13: 81 mg via ORAL
  Filled 2019-11-13: qty 1

## 2019-11-13 MED ORDER — HEPARIN (PORCINE) IN NACL 1000-0.9 UT/500ML-% IV SOLN
INTRAVENOUS | Status: DC | PRN
  Administered 2019-11-13 (×2): 500 mL

## 2019-11-13 MED ORDER — NITROGLYCERIN 1 MG/10 ML FOR IR/CATH LAB
INTRA_ARTERIAL | Status: AC
  Filled 2019-11-13: qty 10

## 2019-11-13 MED ORDER — HYDRALAZINE HCL 20 MG/ML IJ SOLN: 10.0000 mg | INTRAMUSCULAR | Status: AC | PRN

## 2019-11-13 MED ORDER — MIDAZOLAM HCL 2 MG/2ML IJ SOLN
INTRAMUSCULAR | Status: AC
  Filled 2019-11-13: qty 2

## 2019-11-13 MED ORDER — ACETAMINOPHEN 325 MG PO TABS
650.0000 mg | ORAL_TABLET | ORAL | Status: DC | PRN
  Administered 2019-11-13: 650 mg via ORAL
  Filled 2019-11-13: qty 2

## 2019-11-13 MED ORDER — CLOPIDOGREL BISULFATE 75 MG PO TABS: 75.0000 mg | ORAL_TABLET | Freq: Every day | ORAL | Status: DC

## 2019-11-13 MED ORDER — HEPARIN (PORCINE) IN NACL 1000-0.9 UT/500ML-% IV SOLN
INTRAVENOUS | Status: AC
  Filled 2019-11-13: qty 500

## 2019-11-13 MED ORDER — DIAZEPAM 5 MG PO TABS: 5.0000 mg | ORAL_TABLET | Freq: Four times a day (QID) | ORAL | Status: DC | PRN

## 2019-11-13 MED ORDER — VERAPAMIL HCL 2.5 MG/ML IV SOLN
INTRAVENOUS | Status: AC
  Filled 2019-11-13: qty 2

## 2019-11-13 MED ORDER — MIDAZOLAM HCL 2 MG/2ML IJ SOLN
INTRAMUSCULAR | Status: DC | PRN
  Administered 2019-11-13: 2 mg via INTRAVENOUS
  Administered 2019-11-13 (×2): 1 mg via INTRAVENOUS

## 2019-11-13 MED ORDER — LIDOCAINE HCL (PF) 1 % IJ SOLN
INTRAMUSCULAR | Status: DC | PRN
  Administered 2019-11-13: 2 mL

## 2019-11-13 MED ORDER — LIDOCAINE HCL (PF) 1 % IJ SOLN
INTRAMUSCULAR | Status: AC
  Filled 2019-11-13: qty 30

## 2019-11-13 MED ORDER — ONDANSETRON HCL 4 MG/2ML IJ SOLN: 4.0000 mg | Freq: Four times a day (QID) | INTRAMUSCULAR | Status: DC | PRN

## 2019-11-13 MED ORDER — FENTANYL CITRATE (PF) 100 MCG/2ML IJ SOLN
INTRAMUSCULAR | Status: DC | PRN
Start: 2019-11-13 — End: 2019-11-13
  Administered 2019-11-13: 25 ug via INTRAVENOUS
  Administered 2019-11-13: 50 ug via INTRAVENOUS

## 2019-11-13 MED ORDER — FENTANYL CITRATE (PF) 100 MCG/2ML IJ SOLN
INTRAMUSCULAR | Status: AC
  Filled 2019-11-13: qty 2

## 2019-11-13 MED ORDER — SODIUM CHLORIDE 0.9% FLUSH: 3.0000 mL | INTRAVENOUS | Status: DC | PRN

## 2019-11-13 SURGICAL SUPPLY — 18 items
BALLN SAPPHIRE 2.5X12 (BALLOONS) ×2
BALLN SAPPHIRE ~~LOC~~ 3.75X12 (BALLOONS) ×1 IMPLANT
BALLOON SAPPHIRE 2.5X12 (BALLOONS) IMPLANT
CATH INFINITI 5 FR JR3.5 (CATHETERS) ×1 IMPLANT
CATH OPTITORQUE TIG 4.0 5F (CATHETERS) ×1 IMPLANT
CATH VISTA GUIDE 6FR XBLAD3.5 (CATHETERS) ×1 IMPLANT
DEVICE RAD COMP TR BAND LRG (VASCULAR PRODUCTS) ×1 IMPLANT
GLIDESHEATH SLEND SS 6F .021 (SHEATH) ×1 IMPLANT
GUIDEWIRE INQWIRE 1.5J.035X260 (WIRE) IMPLANT
INQWIRE 1.5J .035X260CM (WIRE) ×2
KIT ENCORE 26 ADVANTAGE (KITS) ×1 IMPLANT
KIT HEART LEFT (KITS) ×2 IMPLANT
PACK CARDIAC CATHETERIZATION (CUSTOM PROCEDURE TRAY) ×2 IMPLANT
SHEATH PROBE COVER 6X72 (BAG) ×1 IMPLANT
STENT RESOLUTE ONYX 3.5X15 (Permanent Stent) ×1 IMPLANT
TRANSDUCER W/STOPCOCK (MISCELLANEOUS) ×2 IMPLANT
TUBING CIL FLEX 10 FLL-RA (TUBING) ×2 IMPLANT
WIRE COUGAR XT STRL 190CM (WIRE) ×1 IMPLANT

## 2019-11-13 NOTE — Progress Notes (Signed)
  Echocardiogram 2D Echocardiogram has been performed.  ARRICK DUTTON 11/13/2019, 10:09 AM

## 2019-11-13 NOTE — Progress Notes (Signed)
ANTICOAGULATION CONSULT NOTE   Pharmacy Consult for heparin Indication: chest pain/ACS  No Known Allergies  Patient Measurements: Height: 6\' 2"  (188 cm) Weight: (!) 171.5 kg (378 lb) IBW/kg (Calculated) : 82.2 Heparin Dosing Weight: 123.4kg  Vital Signs: Temp: 98.6 F (37 C) (09/01 2100) Temp Source: Oral (09/01 2100) BP: 137/73 (09/02 0500) Pulse Rate: 53 (09/02 0500)  Labs: Recent Labs    11/12/19 1938 11/12/19 1938 11/12/19 2136 11/13/19 0230 11/13/19 0348  HGB 14.1  --   --   --   --   HCT 44.8  --   --   --   --   PLT 296  --   --   --   --   HEPARINUNFRC  --   --   --   --  0.30  CREATININE 0.89  --   --  0.84  --   TROPONINIHS 347*   < > 362* 419* 417*   < > = values in this interval not displayed.    Estimated Creatinine Clearance: 171.5 mL/min (by C-G formula based on SCr of 0.84 mg/dL).   Medical History: Past Medical History:  Diagnosis Date  . Arthritis    "knees" (02/21/2018)  . Asthma   . 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  . Chest pain 03/18/2014  . Coronary artery disease    a. 2013: cath showing nonobstructive CAD with 50% OM, 20-30% dLAD, 30-40%dRCA  b. 12/2015: NSTEMI w/ 99% stenosis dLAD (DES placed), 99% stenosis 2nd Mrg (DES placed), apical LAD stenosis too small for PCI and mild disease in the RCA.   01/2016 DIABETES MELLITUS, TYPE II 10/17/2006  . ED (erectile dysfunction)   . GERD (gastroesophageal reflux disease)   . History of gout   . History of kidney stones   . HYPERLIPIDEMIA 10/17/2006  . HYPERTENSION 10/17/2006  . NSTEMI (non-ST elevated myocardial infarction) ([facility]) 12/16/2015; 02/19/2018  . OBESITY 10/17/2006  . OSA on CPAP    not using CPAP regularly"need a new one" (02/21/2018)  . Paroxysmal atrial fibrillation ([facility])    2006 - was on coumadin - took himself off 6 mos after initiation.  . Shortness of breath     Medications:  Infusions:  . sodium chloride 75 mL/hr at 11/13/19 0418  . heparin 1,500 Units/hr (11/12/19 2240)    Assessment: 53 yom  presented to the ED with CP and elevated troponins. To start IV heparin. Baseline CBC is WNL and he is not on anticoagulation PTA.   AM f/u - heparin level at low end of goal range.  No overt bleeding or complications noted.  Goal of Therapy:  Heparin level 0.3-0.7 units/ml Monitor platelets by anticoagulation protocol: Yes   Plan:  Increase IV heparin to 1650 units/hr. Check a 6 hr heparin level Daily heparin level and CBC 01/12/20, Reece Leader, [facility] Clinical Pharmacist  11/13/2019 6:36 AM   [facility] pharmacy phone numbers are listed on [email or web address]

## 2019-11-13 NOTE — Interval H&P Note (Signed)
Cath Lab Visit (complete for each Cath Lab visit)  Clinical Evaluation Leading to the Procedure:   ACS: Yes.    Non-ACS:    Anginal Classification: CCS IV  Anti-ischemic medical therapy: Maximal Therapy (2 or more classes of medications)  Non-Invasive Test Results: No non-invasive testing performed  Prior CABG: No previous CABG      History and Physical Interval Note:  11/13/2019 11:39 AM  Raymond Lutz  has presented today for surgery, with the diagnosis of NSTEMI.  The various methods of treatment have been discussed with the patient and family. After consideration of risks, benefits and other options for treatment, the patient has consented to  Procedure(s): LEFT HEART CATH AND CORONARY ANGIOGRAPHY (N/A) as a surgical intervention.  The patient's history has been reviewed, patient examined, no change in status, stable for surgery.  I have reviewed the patient's chart and labs.  Questions were answered to the patient's satisfaction.     Nicki Guadalajara

## 2019-11-13 NOTE — Progress Notes (Signed)
PATIENT ID: [id number]  INTERVAL HISTORY:  Raymond Lutz a 54 y.o.malewith PMH of ASCAD s/p PCI of the distal LAD and OM 2 in 2017, morbid obesity, obstructive sleep apnea(not on CPAP)type 2 diabetes mellitus, hyperlipidemia, hypertension, peripheral vascular disease and paroxysmal atrial fibrillation (not on anticoagulation).  He had a repeat heart cath in 02/2018 for CP and minimally elevated trop showing severe 90% focal in stent restenosis of the mid LCx stent and patent stent in the LAD with mild to moderate LAD/RCA dz (25-30%) unchanged from prior cath. There was also residual 90% distal LAD too small for PCI. He underwent successful PCI to mid LCx ISR using Synergy 2.5 x 12 mm DES (post-dilated to 2.8 mm) with 0% residual stenosis and TIMI-3 flow.  Raymond Lutz presented on 9/1 with complaints of chest pain and currently admitted for NSTEMI.  SUBJECTIVE:    Raymond Lutz states he has not had any additional episodes of chest pain, shortness of breath since the last episode last night.  At this time, he denies any pain or discomfort.  Raymond Lutz states that he has been taking his Plavix as instructed without missing any doses.  He notes that for his hyperlipidemia, he has been compliant with his statin and Zetia.  In terms of his diabetes, he states his doctor recently took him off his diabetic medications and he is currently not taking any.  He was previously on Metformin but had significant GI upset.  PHYSICAL EXAM Vitals:   11/13/19 0200 11/13/19 0300 11/13/19 0400 11/13/19 0500  BP: (!) 143/76 (!) 150/87 138/72 137/73  Pulse: (!) 56 (!) 54 (!) 54 (!) 53  Resp: 11 13 12 12   Temp:      TempSrc:      SpO2: 100% 98% 99% 100%  Weight:      Height:       Physical Exam Vitals reviewed.  Constitutional:      General: He is not in acute distress.    Appearance: He is obese.  Cardiovascular:     Rate and Rhythm: Regular rhythm. Bradycardia present.     Heart sounds:  Heart sounds are distant. Murmur heard.  Systolic murmur is present with a grade of 2/6.  No diastolic murmur is present.  No S3 or S4 sounds.   Pulmonary:     Effort: Pulmonary effort is normal.     Breath sounds: Normal breath sounds. No decreased breath sounds, wheezing, rhonchi or rales.  Chest:     Chest wall: No tenderness.  Abdominal:     General: Bowel sounds are normal.     Palpations: Abdomen is soft.  Musculoskeletal:     Right lower leg: No edema.     Left lower leg: No edema.  Skin:    General: Skin is warm and dry.  Neurological:     General: No focal deficit present.     Mental Status: He is alert and oriented to person, place, and time.  Psychiatric:        Mood and Affect: Mood normal.        Behavior: Behavior normal.    LABS: Lab Results  Component Value Date   TROPONINI 0.12 (HH) 02/20/2018   Results for orders placed or performed during the hospital encounter of 11/12/19 (from the past 24 hour(s))  Basic metabolic panel     Status: Abnormal   Collection Time: 11/12/19  7:38 PM  Result Value Ref Range   Sodium 139 135 - 145  mmol/L   Potassium 3.4 (L) 3.5 - 5.1 mmol/L   Chloride 104 98 - 111 mmol/L   CO2 26 22 - 32 mmol/L   Glucose, Bld 142 (H) 70 - 99 mg/dL   BUN 14 6 - 20 mg/dL   Creatinine, Ser 8.29 0.61 - 1.24 mg/dL   Calcium 9.4 8.9 - 56.2 mg/dL   GFR calc non Af Amer >60 >60 mL/min   GFR calc Af Amer >60 >60 mL/min   Anion gap 9 5 - 15  CBC     Status: None   Collection Time: 11/12/19  7:38 PM  Result Value Ref Range   WBC 5.2 4.0 - 10.5 K/uL   RBC 5.24 4.22 - 5.81 MIL/uL   Hemoglobin 14.1 13.0 - 17.0 g/dL   HCT 13.0 39 - 52 %   MCV 85.5 80.0 - 100.0 fL   MCH 26.9 26.0 - 34.0 pg   MCHC 31.5 30.0 - 36.0 g/dL   RDW 86.5 78.4 - 69.6 %   Platelets 296 150 - 400 K/uL   nRBC 0.0 0.0 - 0.2 %  Troponin I (High Sensitivity)     Status: Abnormal   Collection Time: 11/12/19  7:38 PM  Result Value Ref Range   Troponin I (High Sensitivity) 347  (HH) <18 ng/L  Troponin I (High Sensitivity)     Status: Abnormal   Collection Time: 11/12/19  9:36 PM  Result Value Ref Range   Troponin I (High Sensitivity) 362 (HH) <18 ng/L  SARS Coronavirus 2 by RT PCR (hospital order, performed in [facility] hospital lab) Nasopharyngeal Nasopharyngeal Swab     Status: None   Collection Time: 11/12/19 10:31 PM   Specimen: Nasopharyngeal Swab  Result Value Ref Range   SARS Coronavirus 2 NEGATIVE NEGATIVE  CBG monitoring, ED     Status: Abnormal   Collection Time: 11/13/19 12:15 AM  Result Value Ref Range   Glucose-Capillary 101 (H) 70 - 99 mg/dL  Basic metabolic panel     Status: Abnormal   Collection Time: 11/13/19  2:30 AM  Result Value Ref Range   Sodium 139 135 - 145 mmol/L   Potassium 3.6 3.5 - 5.1 mmol/L   Chloride 106 98 - 111 mmol/L   CO2 24 22 - 32 mmol/L   Glucose, Bld 109 (H) 70 - 99 mg/dL   BUN 17 6 - 20 mg/dL   Creatinine, Ser 2.95 0.61 - 1.24 mg/dL   Calcium 9.0 8.9 - 28.4 mg/dL   GFR calc non Af Amer >60 >60 mL/min   GFR calc Af Amer >60 >60 mL/min   Anion gap 9 5 - 15  Magnesium     Status: None   Collection Time: 11/13/19  2:30 AM  Result Value Ref Range   Magnesium 2.1 1.7 - 2.4 mg/dL  Troponin I (High Sensitivity)     Status: Abnormal   Collection Time: 11/13/19  2:30 AM  Result Value Ref Range   Troponin I (High Sensitivity) 419 (HH) <18 ng/L  Heparin level (unfractionated)     Status: None   Collection Time: 11/13/19  3:48 AM  Result Value Ref Range   Heparin Unfractionated 0.30 0.30 - 0.70 IU/mL  Brain natriuretic peptide     Status: None   Collection Time: 11/13/19  3:48 AM  Result Value Ref Range   B Natriuretic Peptide 12.6 0.0 - 100.0 pg/mL  Troponin I (High Sensitivity)     Status: Abnormal   Collection Time: 11/13/19  3:48  AM  Result Value Ref Range   Troponin I (High Sensitivity) 417 (HH) <18 ng/L   No intake or output data in the 24 hours ending 11/13/19 0829  EKG:  Sinus rhythm with rate of 79. No  axis deviation. No T wave inversions or ST elevation/depression.   ASSESSMENT AND PLAN:  Principal Problem:   NSTEMI (non-ST elevated myocardial infarction) (HCC)   # NSTEMI  Patient with 2 episodes of CP (8/30, 9/1), EKG stable, mildly elevated troponin. Most recent set of troponin is down-trending at this time.  Will move forward with cath today to evaluate if patient developed any new obstruction or restenosis of previous stents.  If patient has restenosis of stents, will likely need to switch Plavix to Brilinta. -Left heart cath today -TTE pending -Continue heparin GTT -Aspirin 81 mg daily -Lopressor 12.5 mg twice daily -Imdur 30 mg daily -Nitro as needed for chest pain  # CAD Long-term history of early CAD since at least 2013 with multiple events since then.  Will need aggressive risk management.  Given his LDL is still not at goal with both a high intensity statin and Zetia, patient may benefit from a PCSK9 inhibitor.  Discussed that patient will need management of his diabetes given his last A1c was 7.2%; he would benefit from an SGLT2 inhibitor. We will discuss making these changes as we get closer to discharge. Encourage he continue to work on weight loss.  -Continue Aspirin and Plavix for now -Continue home metoprolol -Continue home Imdur -Continue home amlodipine -Continue home atorvastatin and Zetia  # HTN Per chart review, patient's blood pressure has been above goal for quite some time.  Medications included amlodipine, Imdur, and losartan, with dose of losartan being increased to maximum approximately 2 weeks ago. -Hold losartan given cath today; plan to restart perhaps tomorrow if no change in creatinine -Continue home Imdur -Continue home amlodipine  # HLD LDL above goal with most recent LDL of 73.  See above for more details - Continue Atorvastatin and Zetia   # pA.fib  Currently in sinus rhythm. Patient was not previously on [facility].  Now being anticoagulated on  heparin for NSTEMI.    Dr. Verdene Lennert Internal Medicine PGY-2  11/13/2019, 8:38 AM

## 2019-11-14 ENCOUNTER — Other Ambulatory Visit: Payer: Self-pay | Admitting: Medical

## 2019-11-14 ENCOUNTER — Other Ambulatory Visit: Payer: Self-pay | Admitting: Family Medicine

## 2019-11-14 ENCOUNTER — Telehealth: Payer: Self-pay | Admitting: Cardiology

## 2019-11-14 ENCOUNTER — Telehealth: Payer: Self-pay | Admitting: *Deleted

## 2019-11-14 DIAGNOSIS — I2510 Atherosclerotic heart disease of native coronary artery without angina pectoris: Secondary | ICD-10-CM

## 2019-11-14 DIAGNOSIS — I10 Essential (primary) hypertension: Secondary | ICD-10-CM

## 2019-11-14 DIAGNOSIS — I2583 Coronary atherosclerosis due to lipid rich plaque: Secondary | ICD-10-CM

## 2019-11-14 DIAGNOSIS — Z79899 Other long term (current) drug therapy: Secondary | ICD-10-CM

## 2019-11-14 DIAGNOSIS — E785 Hyperlipidemia, unspecified: Secondary | ICD-10-CM

## 2019-11-14 DIAGNOSIS — Z760 Encounter for issue of repeat prescription: Secondary | ICD-10-CM

## 2019-11-14 LAB — BASIC METABOLIC PANEL
Anion gap: 9 (ref 5–15)
BUN: 11 mg/dL (ref 6–20)
CO2: 22 mmol/L (ref 22–32)
Calcium: 8.8 mg/dL — ABNORMAL LOW (ref 8.9–10.3)
Chloride: 105 mmol/L (ref 98–111)
Creatinine, Ser: 0.94 mg/dL (ref 0.61–1.24)
GFR calc Af Amer: >60 mL/min (ref >60)
GFR calc non Af Amer: >60 mL/min (ref >60)
Glucose, Bld: 164 mg/dL — ABNORMAL HIGH (ref 70–99)
Potassium: 4.1 mmol/L (ref 3.5–5.1)
Sodium: 136 mmol/L (ref 135–145)

## 2019-11-14 LAB — CBC
HCT: 39.6 % (ref 39.0–52.0)
Hemoglobin: 12.6 g/dL — ABNORMAL LOW (ref 13.0–17.0)
MCH: 26.9 pg (ref 26.0–34.0)
MCHC: 31.8 g/dL (ref 30.0–36.0)
MCV: 84.4 fL (ref 80.0–100.0)
Platelets: 253 10*3/uL (ref 150–400)
RBC: 4.69 MIL/uL (ref 4.22–5.81)
RDW: 14.6 % (ref 11.5–15.5)
WBC: 4.4 10*3/uL (ref 4.0–10.5)
nRBC: 0 % (ref 0.0–0.2)

## 2019-11-14 LAB — GLUCOSE, CAPILLARY
Glucose-Capillary: 122 mg/dL — ABNORMAL HIGH (ref 70–99)
Glucose-Capillary: 109 mg/dL — ABNORMAL HIGH (ref 70–99)

## 2019-11-14 LAB — MAGNESIUM: Magnesium: 1.8 mg/dL (ref 1.7–2.4)

## 2019-11-14 LAB — SPECIMEN STATUS REPORT

## 2019-11-14 LAB — HIV ANTIBODY (ROUTINE TESTING W REFLEX): HIV Screen 4th Generation wRfx: NONREACTIVE

## 2019-11-14 MED ORDER — AMLODIPINE BESYLATE 10 MG PO TABS: 10.0000 mg | ORAL_TABLET | Freq: Every day | ORAL | Status: DC

## 2019-11-14 MED ORDER — TICAGRELOR 90 MG PO TABS
180.0000 mg | ORAL_TABLET | Freq: Once | ORAL | Status: AC
  Administered 2019-11-14: 180 mg via ORAL
  Filled 2019-11-14: qty 2

## 2019-11-14 MED ORDER — CARVEDILOL 12.5 MG PO TABS: 12.5000 mg | ORAL_TABLET | Freq: Two times a day (BID) | ORAL | Status: DC

## 2019-11-14 MED ORDER — AMLODIPINE BESYLATE 10 MG PO TABS: 10.0000 mg | ORAL_TABLET | Freq: Every day | ORAL | 5 refills | Status: DC

## 2019-11-14 MED ORDER — CARVEDILOL 12.5 MG PO TABS: 12.5000 mg | ORAL_TABLET | Freq: Two times a day (BID) | ORAL | 6 refills | Status: DC

## 2019-11-14 MED ORDER — TICAGRELOR 90 MG PO TABS: 90.0000 mg | ORAL_TABLET | Freq: Two times a day (BID) | ORAL | Status: DC

## 2019-11-14 MED ORDER — TICAGRELOR 90 MG PO TABS: 90.0000 mg | ORAL_TABLET | Freq: Two times a day (BID) | ORAL | 3 refills | Status: DC

## 2019-11-14 MED ORDER — EMPAGLIFLOZIN 10 MG PO TABS
10.0000 mg | ORAL_TABLET | Freq: Every day | ORAL | Status: DC
  Administered 2019-11-14: 10 mg via ORAL
  Filled 2019-11-14: qty 1

## 2019-11-14 MED FILL — Clopidogrel Bisulfate Tab 75 MG (Base Equiv): ORAL | Qty: 1 | Status: AC

## 2019-11-14 MED FILL — AMLODIPINE BESYLATE 10 MG T: 10 | 60 days supply | Qty: 60 | Fill #0

## 2019-11-14 MED FILL — CARVEDILOL 12.5 MG TABLET: 12.5 | 30 days supply | Qty: 60 | Fill #0

## 2019-11-14 MED FILL — BRILINTA 90 MG TABLET: 90 | 30 days supply | Qty: 60 | Fill #0

## 2019-11-14 NOTE — Care Management (Addendum)
1154 11-14-19 Case Manager received referral regarding Brilinta. Benefits check submitted and Case Manager will follow for cost. Graves-Bigelow, Lamar Laundry , RN,BSN Case Manager    1235 11-14-19 Case Manager received consult for Jardiance. Benefits check submitted-following for cost. Case Manager spoke with PA and medications should be sent to [facility] once benefits check completed. Gala Lewandowsky, RN,BSN Case Manager

## 2019-11-14 NOTE — Telephone Encounter (Signed)
  TOC appointment on 11/25/19 @ 8:45 am with Tereso Newcomer per Cadence Fransico Michael

## 2019-11-14 NOTE — Discharge Summary (Signed)
Discharge Summary    Patient ID: HERSH MINNEY MRN: [id number]; DOB: Jan 09, 1966  Admit date: 11/12/2019 Discharge date: 11/14/2019  Primary Care Provider: Laurey Morale, MD  Primary Cardiologist: Fransico Him, MD  Primary Electrophysiologist:  None   Discharge Diagnoses    Principal Problem:   NSTEMI (non-ST elevated myocardial infarction) [facility]) Active Problems:   DM II (diabetes mellitus, type II), controlled ([place])   Hyperlipidemia LDL goal <70   Morbid obesity ([place])   Essential hypertension   CAD (coronary artery disease)   OSA (obstructive sleep apnea)  Diagnostic Studies/Procedures    Cardiac cath 11/13/19  Mid LAD-1 lesion is 90% stenosed.  Mid LAD-2 lesion is 20% stenosed.  Previously placed Dist LAD stent (unknown type) is widely patent.  Previously placed 2nd Mrg stent (unknown type) is widely patent.  Previously placed Mid Cx stent (unknown type) is widely patent.  Prox RCA lesion is 20% stenosed.  Mid RCA to Dist RCA lesion is 30% stenosed.  Post intervention, there is a 0% residual stenosis.  A stent was successfully placed.  Non-ST segment elevation myocardial infarction secondary to new 90% proximal to mid LAD stenosis between the first and second diagonal vessel. There is mild 20% narrowing in the mid LAD and a widely patent more distal mid LAD stent.  Patent stent in the circumflex vessel extending into the OM 2 vessel.  Mild nonobstructive disease in a dominant RCA with 20% mid and 30% mid distal narrowing.  Preserved LV function with EF estimated 55%. LVEDP 18 mmHg.  Successful PCI to the 90% proximal to mid LAD stenosis with ultimate insertion of a 3.5 x 15 mm Resolute Onyx stent postdilated to 3.7 mm with the stenosis being reduced to 0%.  RECOMMENDATION: DAPT for minimum of 1 year and probably long-term with his multiple stents. Medical therapy for mild concomitant CAD. Consider reassessment of his obstructive sleep apnea which  currently is not being treated. Aggressive lipid-lowering therapy and continue atorvastatin 80 mg and Zetia 10 mg. If patient unable to achieve LDL less than 70, consider PCSK9 inhibition adjunctive treatment.   Coronary Diagrams  Diagnostic Dominance: Right  Intervention    Echo 11/13/19 1. Left ventricular ejection fraction, by estimation, is 60 to 65%. The  left ventricle has normal function. The left ventricle has no regional  wall motion abnormalities. Left ventricular diastolic parameters were  normal.  2. Right ventricular systolic function is normal. The right ventricular  size is normal. There is normal pulmonary artery systolic pressure.  3. Left atrial size was mildly dilated.  4. The mitral valve is normal in structure. No evidence of mitral valve  regurgitation. No evidence of mitral stenosis.  5. The aortic valve is normal in structure. Aortic valve regurgitation is  not visualized. No aortic stenosis is present.  6. The inferior vena cava is normal in size with greater than 50%  respiratory variability, suggesting right atrial pressure of 3 mmHg.   _____________   History of Present Illness     Raymond Lutz is a 54 y.o. male with pmh of ASCAD s/p DES of the distal LAD and OM2 in 2017 and ISR of LCx stent s/p PCI in 2019, morbid obesity, OSA (not on CPAP), DM2, HLD, HTN, PVD, PAD (patient self discontinued coumadin in 2006) who presented with chest pain. The patient presented to the clinic 11/12/19 for an episode of chest pain on Monday night that woke him up from sleep. The pain was sharp and lasted 30  minutes. It was substernal and non radiating. No exertional symptoms. At the office EKG was unremarkable. Hs troponin came back at 347 and he was told to go to the ED for further evaluation.   Hospital Course     Consultants: None  In the ED the patient had an episode of chest pain that was relieved with SL nitro and lopressor 47m IV. He was given ASA  3228m Patient was started on IV heparin and admitted for cath. All home meds except losartan and lasix were continued. Pressures were elevated and amlodipine was increased. BB unable to be increased due to bradycardia. Cath showed 90% stenosis of mLAD-1, 20% stenosis mLAD-2, widely patent stents x 3, prox RCA lesion 20% stenosis, 30% stenosis on mid RCA to dis RCA, preserved LV function. The patient was treated with PCI/DES to prox to mid LAD. Patient tolerated procedure. Plan for DAPT with aspirin and plavix for 1 year. Continue with concominant medical therapy. Echo showed LVEF 60-65%, no WMA. The patient did not have recurrent chest pain. He remained in sinus rhythm. Cardiac worked with the patient and was able to ambulate without symptoms. Cardiac site, right radial, remained stable without bruising, bleeding, tenderness. LDL came back at 72 with goal >70 already on statin and zetia. Will need to consider PCSK9i as outpatient. Bps remained high and metoprolol was switched to coreg 12.42m72mID. Amlodipine was increased to 10 mg daily. Plan to restart Losartan on discharge. Given elevated A1C the plan was to add Jar[place]hanad Far[place]ver insurance did not cover this. He will need to follow-up with his PCP for further diabetes management. Since insurance will cover brilina will plan for DAPT with aspirin and Brilinta.  He did not tolerate metformin in the past. Also plan to re-visit OSA as outpatient.   The patient was evaluated by Dr. RanOval Linsey 11/14/19 and felt to be stable for discharge.   Did the patient have an acute coronary syndrome (MI, NSTEMI, STEMI, etc) this admission?:  Yes                               AHA/ACC Clinical Performance & Quality Measures: 1. Aspirin prescribed? - Yes 2. ADP Receptor Inhibitor (Plavix/Clopidogrel, Brilinta/Ticagrelor or Effient/Prasugrel) prescribed (includes medically managed patients)? - Yes 3. Beta Blocker prescribed? - Yes 4. High Intensity Statin (Lipitor  40-49m86m Crestor 20-40mg34mescribed? - Yes 5. EF assessed during THIS hospitalization? - Yes 6. For EF <40%, was ACEI/ARB prescribed? - Not Applicable (EF >/= 40%) 19%For EF <40%, Aldosterone Antagonist (Spironolactone or Eplerenone) prescribed? - Not Applicable (EF >/= 40%) 14%Cardiac Rehab Phase II ordered (including medically managed patients)? - Yes   _____________  Discharge Vitals Blood pressure (!) 144/71, pulse (!) 59, temperature 98.2 F (36.8 C), temperature source Oral, resp. rate 17, height 6' 2"  (1.88 m), weight (!) 171.5 kg, SpO2 98 %.  Filed Weights   11/12/19 1934 11/13/19 1422 11/14/19 0432  Weight: (!) 171.5 kg (!) 169.1 kg (!) 171.5 kg    Labs & Radiologic Studies    CBC Recent Labs    11/12/19 1938  WBC 5.2  HGB 14.1  HCT 44.8  MCV 85.5  PLT 296  782sic Metabolic Panel Recent Labs    11/12/19 1938 11/13/19 0230  NA 139 139  K 3.4* 3.6  CL 104 106  CO2 26 24  GLUCOSE 142* 109*  BUN 14 17  CREATININE 0.89  0.84  CALCIUM 9.4 9.0  MG  --  2.1   Liver Function Tests No results for input(s): AST, ALT, ALKPHOS, BILITOT, PROT, ALBUMIN in the last 72 hours. No results for input(s): LIPASE, AMYLASE in the last 72 hours. High Sensitivity Troponin:   Recent Labs  Lab 11/12/19 1938 11/12/19 2136 11/13/19 0230 11/13/19 0348  TROPONINIHS 347* 362* 419* 417*    BNP Invalid input(s): POCBNP D-Dimer No results for input(s): DDIMER in the last 72 hours. Hemoglobin A1C No results for input(s): HGBA1C in the last 72 hours. Fasting Lipid Panel No results for input(s): CHOL, HDL, LDLCALC, TRIG, CHOLHDL, LDLDIRECT in the last 72 hours. Thyroid Function Tests No results for input(s): TSH, T4TOTAL, T3FREE, THYROIDAB in the last 72 hours.  Invalid input(s): FREET3 _____________  DG Chest 2 View  Result Date: 11/12/2019 CLINICAL DATA:  Chest pain EXAM: CHEST - 2 VIEW COMPARISON:  12/16/2015, 02/19/2018 FINDINGS: The heart size and mediastinal contours  are within normal limits. Both lungs are clear. The visualized skeletal structures are unremarkable. IMPRESSION: No active cardiopulmonary disease. Electronically Signed   By: Donavan Foil M.D.   On: 11/12/2019 20:16   CARDIAC CATHETERIZATION  Result Date: 11/13/2019  Mid LAD-1 lesion is 90% stenosed.  Mid LAD-2 lesion is 20% stenosed.  Previously placed Dist LAD stent (unknown type) is widely patent.  Previously placed 2nd Mrg stent (unknown type) is widely patent.  Previously placed Mid Cx stent (unknown type) is widely patent.  Prox RCA lesion is 20% stenosed.  Mid RCA to Dist RCA lesion is 30% stenosed.  Post intervention, there is a 0% residual stenosis.  A stent was successfully placed.  Non-ST segment elevation myocardial infarction secondary to new 90% proximal to mid LAD stenosis between the first and second diagonal vessel.  There is mild 20% narrowing in the mid LAD and a widely patent more distal mid LAD stent. Patent stent in the circumflex vessel extending into the OM 2 vessel. Mild nonobstructive disease in a dominant RCA with 20% mid and 30% mid distal narrowing. Preserved LV function with EF estimated 55%.  LVEDP 18 mmHg. Successful PCI to the 90% proximal to mid LAD stenosis with ultimate insertion of a 3.5 x 15 mm Resolute Onyx stent postdilated to 3.7 mm with the stenosis being reduced to 0%. RECOMMENDATION: DAPT for minimum of 1 year and probably long-term with his multiple stents.  Medical therapy for mild concomitant CAD.  Consider reassessment of his obstructive sleep apnea which currently is not being treated.  Aggressive lipid-lowering therapy and continue atorvastatin 80 mg and Zetia 10 mg.  If patient unable to achieve LDL less than 70, consider PCSK9 inhibition adjunctive treatment.   ECHOCARDIOGRAM COMPLETE  Result Date: 11/13/2019    ECHOCARDIOGRAM REPORT   Patient Name:   Raymond Lutz Date of Exam: 11/13/2019 Medical Rec #:  [id number]        Height:       74.0 in  Accession #:    [id number]       Weight:       378.0 lb Date of Birth:  03/02/1966       BSA:          2.849 m Patient Age:    54 years         BP:           137/73 mmHg Patient Gender: M                HR:  53 bpm. Exam Location:  Inpatient Procedure: 2D Echo Indications:    NSTEMI I21.4  History:        Patient has prior history of Echocardiogram examinations, most                 recent 02/21/2018. CAD and Previous Myocardial Infarction,                 Arrythmias:Atrial Fibrillation; Risk Factors:Hypertension,                 Diabetes and Current Smoker.  Sonographer:    Mikki Santee RDCS (AE) Referring Phys: [id number] HEATHER E [place] IMPRESSIONS  1. Left ventricular ejection fraction, by estimation, is 60 to 65%. The left ventricle has normal function. The left ventricle has no regional wall motion abnormalities. Left ventricular diastolic parameters were normal.  2. Right ventricular systolic function is normal. The right ventricular size is normal. There is normal pulmonary artery systolic pressure.  3. Left atrial size was mildly dilated.  4. The mitral valve is normal in structure. No evidence of mitral valve regurgitation. No evidence of mitral stenosis.  5. The aortic valve is normal in structure. Aortic valve regurgitation is not visualized. No aortic stenosis is present.  6. The inferior vena cava is normal in size with greater than 50% respiratory variability, suggesting right atrial pressure of 3 mmHg. FINDINGS  Left Ventricle: Left ventricular ejection fraction, by estimation, is 60 to 65%. The left ventricle has normal function. The left ventricle has no regional wall motion abnormalities. The left ventricular internal cavity size was normal in size. There is  no left ventricular hypertrophy. Left ventricular diastolic parameters were normal. Right Ventricle: The right ventricular size is normal. No increase in right ventricular wall thickness. Right ventricular systolic function is  normal. There is normal pulmonary artery systolic pressure. The tricuspid regurgitant velocity is 1.81 m/s, and  with an assumed right atrial pressure of 8 mmHg, the estimated right ventricular systolic pressure is 72.6 mmHg. Left Atrium: Left atrial size was mildly dilated. Right Atrium: Right atrial size was normal in size. Pericardium: There is no evidence of pericardial effusion. Mitral Valve: The mitral valve is normal in structure. Normal mobility of the mitral valve leaflets. No evidence of mitral valve regurgitation. No evidence of mitral valve stenosis. Tricuspid Valve: The tricuspid valve is normal in structure. Tricuspid valve regurgitation is trivial. No evidence of tricuspid stenosis. Aortic Valve: The aortic valve is normal in structure. Aortic valve regurgitation is not visualized. No aortic stenosis is present. Pulmonic Valve: The pulmonic valve was normal in structure. Pulmonic valve regurgitation is not visualized. No evidence of pulmonic stenosis. Aorta: The aortic root is normal in size and structure. Venous: The inferior vena cava is normal in size with greater than 50% respiratory variability, suggesting right atrial pressure of 3 mmHg. IAS/Shunts: No atrial level shunt detected by color flow Doppler.  LEFT VENTRICLE PLAX 2D LVIDd:         5.50 cm LVIDs:         3.70 cm LV PW:         1.30 cm LV IVS:        1.10 cm LVOT diam:     2.30 cm LV SV:         89 LV SV Index:   31 LVOT Area:     4.15 cm  RIGHT VENTRICLE TAPSE (M-mode): 1.9 cm LEFT ATRIUM  Index       RIGHT ATRIUM           Index LA diam:        4.20 cm 1.47 cm/m  RA Area:     22.00 cm LA Vol (A2C):   91.2 ml 32.01 ml/m RA Volume:   64.90 ml  22.78 ml/m LA Vol (A4C):   63.1 ml 22.15 ml/m LA Biplane Vol: 77.5 ml 27.21 ml/m  AORTIC VALVE LVOT Vmax:   97.30 cm/s LVOT Vmean:  62.400 cm/s LVOT VTI:    0.214 m  AORTA Ao Root diam: 3.70 cm TRICUSPID VALVE TR Peak grad:   13.1 mmHg TR Vmax:        181.00 cm/s  SHUNTS Systemic  VTI:  0.21 m Systemic Diam: 2.30 cm Jenkins Rouge MD Electronically signed by Jenkins Rouge MD Signature Date/Time: 11/13/2019/10:32:44 AM    Final    Disposition   Pt is being discharged home today in good condition.  Follow-up Plans & Appointments     Follow-up Information    Liliane Shi, PA-C Follow up on 11/25/2019.   Specialties: Cardiology, Physician Assistant Why: @ 8:45AM Contact information: [address] [place] [address] [phone]              Discharge Instructions    Amb Referral to Cardiac Rehabilitation   Complete by: As directed    Diagnosis:  Coronary Stents NSTEMI     After initial evaluation and assessments completed: Virtual Based Care may be provided alone or in conjunction with Phase 2 Cardiac Rehab based on patient barriers.: Yes      Discharge Medications   Allergies as of 11/14/2019   No Known Allergies     Medication List    STOP taking these medications   BLOOD GLUCOSE TEST STRIPS Strp   clopidogrel 75 MG tablet Commonly known as: PLAVIX   FreeStyle Freedom Lite w/Device Kit   metoprolol tartrate 25 MG tablet Commonly known as: LOPRESSOR     TAKE these medications   albuterol 108 (90 Base) MCG/ACT inhaler Commonly known as: Ventolin HFA Inhale 2 puffs into the lungs every 4 (four) hours as needed. What changed: reasons to take this   amLODipine 10 MG tablet Commonly known as: NORVASC Take 1 tablet (10 mg total) by mouth daily. Start taking on: November 15, 2019 What changed:   medication strength  how much to take   aspirin EC 81 MG tablet Take 1 tablet (81 mg total) by mouth daily.   atorvastatin 80 MG tablet Commonly known as: LIPITOR Take 1 tablet (80 mg total) by mouth daily.   carvedilol 12.5 MG tablet Commonly known as: COREG Take 1 tablet (12.5 mg total) by mouth 2 (two) times daily with a meal.   cyclobenzaprine 10 MG tablet Commonly known as: FLEXERIL Take 1 tablet (10 mg  total) by mouth 3 (three) times daily as needed for muscle spasms.   ezetimibe 10 MG tablet Commonly known as: ZETIA Take 1 tablet by mouth once daily   furosemide 40 MG tablet Commonly known as: LASIX Take 1 tablet (40 mg total) by mouth 2 (two) times daily.   isosorbide mononitrate 30 MG 24 hr tablet Commonly known as: IMDUR Take 1 tablet (30 mg total) by mouth daily.   losartan 100 MG tablet Commonly known as: COZAAR Take 1 tablet (100 mg total) by mouth daily.   nitroGLYCERIN 0.4 MG SL tablet Commonly known as: NITROSTAT Place 1 tablet (  0.4 mg total) under the tongue every 5 (five) minutes x 3 doses as needed for chest pain.   potassium chloride SA 20 MEQ tablet Commonly known as: KLOR-CON TAKE 2 TABLETS BY MOUTH IN THE MORNING What changed: See the new instructions.   tadalafil 20 MG tablet Commonly known as: Cialis Take 1 tablet (20 mg total) by mouth daily as needed for erectile dysfunction.   ticagrelor 90 MG Tabs tablet Commonly known as: BRILINTA Take 1 tablet (90 mg total) by mouth 2 (two) times daily.   traMADol 50 MG tablet Commonly known as: ULTRAM Take 2 tablets (100 mg total) by mouth every 6 (six) hours as needed for moderate pain.          Outstanding Labs/Studies   BMET in 1 week  Duration of Discharge Encounter   Greater than 30 minutes including physician time.  Signed, Caius Silbernagel Ninfa Meeker, PA-C 11/14/2019, 1:51 PM

## 2019-11-14 NOTE — Telephone Encounter (Signed)
Called patient for [facility] follow up. Patient has not be discharged yet, but should be today at some point (11/14/2019)  Advised him that our office will be reaching out to him again after discharge. Patient verbalized understanding.

## 2019-11-14 NOTE — TOC Benefit Eligibility Note (Signed)
Transition of Care [facility]) Benefit Eligibility Note    Patient Details  Name: Raymond Lutz MRN: [id number] Date of Birth: 1965/09/25   Medication/Dose: Marden Noble 90 MG BID  Covered?: Yes  Tier: 2 Drug  Prescription Coverage Preferred Pharmacy: [facility] with Person/Company/Phone Number:: [employer]    @ [facility]  # [phone]  Co-Pay: $40.00  Prior Approval: No  Deductible:  (NO DEDUCTIBLE WITH PLAN  and  OUT-OF-POCKET:UNMET)  Additional Notes: FARXIGA 10 MG DAILY  and   JARDIANCE 10 MG DAILY : BOTH  NOT COVER  P/A YES # [id number]  , ALTERNATIVE : METFORMIN 500 MG DAILY COVER-YES CO-PAY-$ 5.12 TIER- 1 DRUG P/A-NO    Raymond Lutz Phone Number: 11/14/2019, 1:35 PM

## 2019-11-14 NOTE — Progress Notes (Signed)
CARDIAC REHAB PHASE I   PRE:  Rate/Rhythm: 61 SR  BP:  Supine:   Sitting: 164/87  Standing:    SaO2: 99%RA  MODE:  Ambulation: 700 ft   POST:  Rate/Rhythm: 95 SR  BP:  Supine:   Sitting: 148/87  Standing:    SaO2: 99%RA 0809-0920 Pt walked 700 ft on RA with steady gait and tolerated well. No CP. MI education completed with pt and wife who voiced understanding. Reviewed importance of plavix with stent. Reviewed NTG use, MI restrictions, heart healthy and low carb food choices, ex ed, and CRP 2. Referred to [place] program. Pt stated he usually goes to work around 10 am.    Luetta Nutting, RN BSN  11/14/2019 9:09 AM

## 2019-11-14 NOTE — Progress Notes (Signed)
Progress Note  Patient Name: Raymond Lutz Date of Encounter: 11/14/2019  [facility] Cardiologist: Armanda Magic, MD   Subjective   Patient underwent successful PCI to mLAD. Echo with preserved pump function. Bps elevated which the patient says is abnormal. He denies recurrent chest pain. No sob. Cath site, right radial, is stable. Will get AM labs. Cardiac rehab in with patient now.   Inpatient Medications    Scheduled Meds: . amLODipine  7.5 mg Oral Daily  . aspirin EC  81 mg Oral Daily  . atorvastatin  80 mg Oral Daily  . clopidogrel  75 mg Oral Q breakfast  . ezetimibe  10 mg Oral Daily  . insulin aspart  0-15 Units Subcutaneous TID WC  . insulin aspart  0-5 Units Subcutaneous QHS  . isosorbide mononitrate  30 mg Oral Daily  . metoprolol tartrate  12.5 mg Oral BID  . sodium chloride flush  3 mL Intravenous Q12H   Continuous Infusions: . sodium chloride 150 mL/hr at 11/13/19 2125  . sodium chloride     PRN Meds: sodium chloride, acetaminophen, albuterol, diazepam, nitroGLYCERIN, ondansetron (ZOFRAN) IV, sodium chloride flush   Vital Signs    Vitals:   11/13/19 1820 11/13/19 2017 11/13/19 2346 11/14/19 0432  BP: (!) 205/100 (!) 186/92 (!) 169/91 (!) 143/80  Pulse: (!) 129 63 (!) 54 (!) 57  Resp: Temp:  98.3 F (36.8 C) 98.4 F (36.9 C) 98 F (36.7 C)  TempSrc:  Oral Oral Oral  SpO2: 97% 100% 99% 99%  Weight:    (!) 171.5 kg  Height:        Intake/Output Summary (Last 24 hours) at 11/14/2019 0807 Last data filed at 11/14/2019 0145 Gross per 24 hour  Intake 780 ml  Output 450 ml  Net 330 ml   Last 3 Weights 11/14/2019 11/13/2019 11/12/2019  Weight (lbs) 378 lb 1.6 oz 372 lb 11.2 oz 378 lb  Weight (kg) 171.505 kg 169.056 kg 171.46 kg      Telemetry    NSR, HR 50-60, brief 1st degree AV block - Personally Reviewed  ECG    NSR, 60bpm, 1st degree AVB, iRBBB, nonspecific T wave changes inferior leads - Personally Reviewed  Physical Exam    GEN: No acute distress.   Neck: No JVD Cardiac: RRR, no murmurs, rubs, or gallops.  Respiratory: Clear to auscultation bilaterally. GI: Soft, nontender, non-distended  MS: No edema; No deformity. Neuro:  Nonfocal  Psych: Normal affect   Labs    High Sensitivity Troponin:   Recent Labs  Lab 11/12/19 1938 11/12/19 2136 11/13/19 0230 11/13/19 0348  TROPONINIHS 347* 362* 419* 417*      Chemistry Recent Labs  Lab 11/12/19 1938 11/13/19 0230  NA 139 139  K 3.4* 3.6  CL 104 106  CO2 26 24  GLUCOSE 142* 109*  BUN 14 17  CREATININE 0.89 0.84  CALCIUM 9.4 9.0  GFRNONAA >60 >60  GFRAA >60 >60  ANIONGAP 9 9     Hematology Recent Labs  Lab 11/12/19 1938  WBC 5.2  RBC 5.24  HGB 14.1  HCT 44.8  MCV 85.5  MCH 26.9  MCHC 31.5  RDW 14.6  PLT 296    BNP Recent Labs  Lab 11/13/19 0348  BNP 12.6     DDimer No results for input(s): DDIMER in the last 168 hours.   Radiology    DG Chest 2 View  Result Date: 11/12/2019 CLINICAL DATA:  Chest pain EXAM: CHEST - 2 VIEW COMPARISON:  12/16/2015, 02/19/2018 FINDINGS: The heart size and mediastinal contours are within normal limits. Both lungs are clear. The visualized skeletal structures are unremarkable. IMPRESSION: No active cardiopulmonary disease. Electronically Signed   By: Jasmine PangKim  Fujinaga M.D.   On: 11/12/2019 20:16   CARDIAC CATHETERIZATION  Result Date: 11/13/2019  Mid LAD-1 lesion is 90% stenosed.  Mid LAD-2 lesion is 20% stenosed.  Previously placed Dist LAD stent (unknown type) is widely patent.  Previously placed 2nd Mrg stent (unknown type) is widely patent.  Previously placed Mid Cx stent (unknown type) is widely patent.  Prox RCA lesion is 20% stenosed.  Mid RCA to Dist RCA lesion is 30% stenosed.  Post intervention, there is a 0% residual stenosis.  A stent was successfully placed.  Non-ST segment elevation myocardial infarction secondary to new 90% proximal to mid LAD stenosis between the first and  second diagonal vessel.  There is mild 20% narrowing in the mid LAD and a widely patent more distal mid LAD stent. Patent stent in the circumflex vessel extending into the OM 2 vessel. Mild nonobstructive disease in a dominant RCA with 20% mid and 30% mid distal narrowing. Preserved LV function with EF estimated 55%.  LVEDP 18 mmHg. Successful PCI to the 90% proximal to mid LAD stenosis with ultimate insertion of a 3.5 x 15 mm Resolute Onyx stent postdilated to 3.7 mm with the stenosis being reduced to 0%. RECOMMENDATION: DAPT for minimum of 1 year and probably long-term with his multiple stents.  Medical therapy for mild concomitant CAD.  Consider reassessment of his obstructive sleep apnea which currently is not being treated.  Aggressive lipid-lowering therapy and continue atorvastatin 80 mg and Zetia 10 mg.  If patient unable to achieve LDL less than 70, consider PCSK9 inhibition adjunctive treatment.   ECHOCARDIOGRAM COMPLETE  Result Date: 11/13/2019    ECHOCARDIOGRAM REPORT   Patient Name:   Raymond Lutz Date of Exam: 11/13/2019 Medical Rec #:  [id number]        Height:       74.0 in Accession #:    [id number]       Weight:       378.0 lb Date of Birth:  11-Oct-1965       BSA:          2.849 m Patient Age:    53 years         BP:           137/73 mmHg Patient Gender: M                HR:           53 bpm. Exam Location:  Inpatient Procedure: 2D Echo Indications:    NSTEMI I21.4  History:        Patient has prior history of Echocardiogram examinations, most                 recent 02/21/2018. CAD and Previous Myocardial Infarction,                 Arrythmias:Atrial Fibrillation; Risk Factors:Hypertension,                 Diabetes and Current Smoker.  Sonographer:    Thurman Coyerasey Kirkpatrick RDCS (AE) Referring Phys: [id number] HEATHER E [place] IMPRESSIONS  1. Left ventricular ejection fraction, by estimation, is 60 to 65%. The left ventricle has normal function. The left ventricle has no regional wall motion  abnormalities. Left ventricular diastolic parameters were normal.  2. Right ventricular systolic function is normal. The right ventricular size is normal. There is normal pulmonary artery systolic pressure.  3. Left atrial size was mildly dilated.  4. The mitral valve is normal in structure. No evidence of mitral valve regurgitation. No evidence of mitral stenosis.  5. The aortic valve is normal in structure. Aortic valve regurgitation is not visualized. No aortic stenosis is present.  6. The inferior vena cava is normal in size with greater than 50% respiratory variability, suggesting right atrial pressure of 3 mmHg. FINDINGS  Left Ventricle: Left ventricular ejection fraction, by estimation, is 60 to 65%. The left ventricle has normal function. The left ventricle has no regional wall motion abnormalities. The left ventricular internal cavity size was normal in size. There is  no left ventricular hypertrophy. Left ventricular diastolic parameters were normal. Right Ventricle: The right ventricular size is normal. No increase in right ventricular wall thickness. Right ventricular systolic function is normal. There is normal pulmonary artery systolic pressure. The tricuspid regurgitant velocity is 1.81 m/s, and  with an assumed right atrial pressure of 8 mmHg, the estimated right ventricular systolic pressure is 21.1 mmHg. Left Atrium: Left atrial size was mildly dilated. Right Atrium: Right atrial size was normal in size. Pericardium: There is no evidence of pericardial effusion. Mitral Valve: The mitral valve is normal in structure. Normal mobility of the mitral valve leaflets. No evidence of mitral valve regurgitation. No evidence of mitral valve stenosis. Tricuspid Valve: The tricuspid valve is normal in structure. Tricuspid valve regurgitation is trivial. No evidence of tricuspid stenosis. Aortic Valve: The aortic valve is normal in structure. Aortic valve regurgitation is not visualized. No aortic stenosis is  present. Pulmonic Valve: The pulmonic valve was normal in structure. Pulmonic valve regurgitation is not visualized. No evidence of pulmonic stenosis. Aorta: The aortic root is normal in size and structure. Venous: The inferior vena cava is normal in size with greater than 50% respiratory variability, suggesting right atrial pressure of 3 mmHg. IAS/Shunts: No atrial level shunt detected by color flow Doppler.  LEFT VENTRICLE PLAX 2D LVIDd:         5.50 cm LVIDs:         3.70 cm LV PW:         1.30 cm LV IVS:        1.10 cm LVOT diam:     2.30 cm LV SV:         89 LV SV Index:   31 LVOT Area:     4.15 cm  RIGHT VENTRICLE TAPSE (M-mode): 1.9 cm LEFT ATRIUM             Index       RIGHT ATRIUM           Index LA diam:        4.20 cm 1.47 cm/m  RA Area:     22.00 cm LA Vol (A2C):   91.2 ml 32.01 ml/m RA Volume:   64.90 ml  22.78 ml/m LA Vol (A4C):   63.1 ml 22.15 ml/m LA Biplane Vol: 77.5 ml 27.21 ml/m  AORTIC VALVE LVOT Vmax:   97.30 cm/s LVOT Vmean:  62.400 cm/s LVOT VTI:    0.214 m  AORTA Ao Root diam: 3.70 cm TRICUSPID VALVE TR Peak grad:   13.1 mmHg TR Vmax:        181.00 cm/s  SHUNTS Systemic VTI:  0.21 m Systemic Diam: 2.30 cm  Charlton Haws MD Electronically signed by Charlton Haws MD Signature Date/Time: 11/13/2019/10:32:44 AM    Final     Cardiac Studies   Cardiac cath   Mid LAD-1 lesion is 90% stenosed.  Mid LAD-2 lesion is 20% stenosed.  Previously placed Dist LAD stent (unknown type) is widely patent.  Previously placed 2nd Mrg stent (unknown type) is widely patent.  Previously placed Mid Cx stent (unknown type) is widely patent.  Prox RCA lesion is 20% stenosed.  Mid RCA to Dist RCA lesion is 30% stenosed.  Post intervention, there is a 0% residual stenosis.  A stent was successfully placed.   Non-ST segment elevation myocardial infarction secondary to new 90% proximal to mid LAD stenosis between the first and second diagonal vessel.  There is mild 20% narrowing in the mid LAD  and a widely patent more distal mid LAD stent.  Patent stent in the circumflex vessel extending into the OM 2 vessel.  Mild nonobstructive disease in a dominant RCA with 20% mid and 30% mid distal narrowing.  Preserved LV function with EF estimated 55%.  LVEDP 18 mmHg.  Successful PCI to the 90% proximal to mid LAD stenosis with ultimate insertion of a 3.5 x 15 mm Resolute Onyx stent postdilated to 3.7 mm with the stenosis being reduced to 0%.  RECOMMENDATION: DAPT for minimum of 1 year and probably long-term with his multiple stents.  Medical therapy for mild concomitant CAD.  Consider reassessment of his obstructive sleep apnea which currently is not being treated.  Aggressive lipid-lowering therapy and continue atorvastatin 80 mg and Zetia 10 mg.  If patient unable to achieve LDL less than 70, consider PCSK9 inhibition adjunctive treatment.   Coronary Diagrams  Diagnostic Dominance: Right  Intervention    Echo 11/13/19 1. Left ventricular ejection fraction, by estimation, is 60 to 65%. The  left ventricle has normal function. The left ventricle has no regional  wall motion abnormalities. Left ventricular diastolic parameters were  normal.  2. Right ventricular systolic function is normal. The right ventricular  size is normal. There is normal pulmonary artery systolic pressure.  3. Left atrial size was mildly dilated.  4. The mitral valve is normal in structure. No evidence of mitral valve  regurgitation. No evidence of mitral stenosis.  5. The aortic valve is normal in structure. Aortic valve regurgitation is  not visualized. No aortic stenosis is present.  6. The inferior vena cava is normal in size with greater than 50%  respiratory variability, suggesting right atrial pressure of 3 mmHg.   Patient Profile     54 y.o. male with pmh of ASCAD s/p of the distal LAD and OM2 in 2017, morbid obesity, OSA (not on CPAP), DM2, HLD, HTN, PVD, PAF (not anticoagulation).     Assessment & Plan    NSTEMI with history of CAD with multiple stents - presented of sharp pressure like CP resolved with SL NTG - s/p PCI of distal LAD and OM2 in 2017 - Last cath was in 2019 with PCI to LCx - Troponin elevated, peak 419 - underwent cath showing 90% Mid LAD-1 and 20% stenosis Mid LAD 2, patent stents x 3, prox RCA 20% stenosed, mid RCA to dist RCA lesion 30% stenosed. He was treated with DES to mid LAD. Preserved LV function - Plan for DAPT for 12 months with aspirin and plavix - Echo showed LVEF 60-65%, no WMA - Will check labs - Cath site is stable - Cardiac rehab working with the patient  HLD - LDL 72 - continue atorvastatin and zetia  HTN - amlodipine increased to 7.5mg  daily - Lopressor 12.5mg  BID>>cannot increase further for bradycardia - Losartan 100mg  daily - pressures still elevated this am, will increase amlodipine - Imdur 30 mg daily  Pafib - in sinus -previously not on anticoagulation - CHADSVASC at least 3 ( HTN, CAD/PVD, DM2). Likely needs long term anticoagulation  For questions or updates, please contact [facility] Please consult [email or web address] for contact info under        Signed, Raeonna Milo , PA-C  11/14/2019, 8:07 AM

## 2019-11-15 LAB — TROPONIN T

## 2019-11-18 NOTE — Telephone Encounter (Signed)
Transition Care Management Unsuccessful Follow-up Telephone Call  Date of discharge and from where:  11/14/2019 from [facility]  Attempts:  1st Attempt  Reason for unsuccessful TCM follow-up call:  Left voice message

## 2019-11-18 NOTE — Telephone Encounter (Signed)
1st attempt to call patient is in previous note.

## 2019-11-19 ENCOUNTER — Other Ambulatory Visit: Payer: Self-pay

## 2019-11-19 LAB — SPECIMEN STATUS REPORT

## 2019-11-19 NOTE — Telephone Encounter (Signed)
Transition Care Management Unsuccessful Follow-up Telephone Call  Date of discharge and from where:  11/14/19 from [facility]  Attempts:  2nd Attempt  Reason for unsuccessful TCM follow-up call:  Left voice message to call office

## 2019-11-20 ENCOUNTER — Ambulatory Visit (INDEPENDENT_AMBULATORY_CARE_PROVIDER_SITE_OTHER): Payer: BC Managed Care – PPO | Admitting: Family Medicine

## 2019-11-20 ENCOUNTER — Encounter: Payer: Self-pay | Admitting: Family Medicine

## 2019-11-20 ENCOUNTER — Other Ambulatory Visit: Payer: Self-pay | Admitting: Family Medicine

## 2019-11-20 VITALS — BP 138/80 | HR 69 | Ht 74.0 in | Wt 374.2 lb

## 2019-11-20 DIAGNOSIS — I214 Non-ST elevation (NSTEMI) myocardial infarction: Secondary | ICD-10-CM

## 2019-11-20 DIAGNOSIS — E7800 Pure hypercholesterolemia, unspecified: Secondary | ICD-10-CM

## 2019-11-20 DIAGNOSIS — E118 Type 2 diabetes mellitus with unspecified complications: Secondary | ICD-10-CM | POA: Diagnosis not present

## 2019-11-20 DIAGNOSIS — I10 Essential (primary) hypertension: Secondary | ICD-10-CM | POA: Diagnosis not present

## 2019-11-20 MED ORDER — GLIPIZIDE 5 MG PO TABS: 5.0000 mg | ORAL_TABLET | Freq: Two times a day (BID) | ORAL | 3 refills | Status: DC

## 2019-11-20 MED ORDER — METFORMIN HCL ER 500 MG PO TB24: 500.0000 mg | ORAL_TABLET | Freq: Every day | ORAL | 3 refills | Status: DC

## 2019-11-20 MED ORDER — TERBINAFINE HCL 250 MG PO TABS: 250.0000 mg | ORAL_TABLET | Freq: Every day | ORAL | 1 refills | Status: DC

## 2019-11-20 NOTE — Progress Notes (Signed)
   Subjective:    Patient ID: Raymond Lutz, male    DOB: 1966-03-02, 54 y.o.   MRN: [id number]  HPI Here to follow up a hospital stay from 11-12-19 to 11-14-19 for CAD and for diabetes management. His last A1c in July was 7.2. he was recently admitted for chest pain and he was found to have had a non-STEMI. A cath revealed a 90% lesion stenosis in the LAD, and this was stented open. Since coming home he has felt fine with no chest pain or SOB. He was told to see [place] for more aggressive control of his diabetes.    Review of Systems  Constitutional: Negative.   Respiratory: Negative.   Cardiovascular: Negative.        Objective:   Physical Exam Constitutional:      General: He is not in acute distress.    Appearance: He is obese.  Cardiovascular:     Rate and Rhythm: Normal rate and regular rhythm.     Pulses: Normal pulses.     Heart sounds: Normal heart sounds.  Pulmonary:     Effort: Pulmonary effort is normal.     Breath sounds: Normal breath sounds.  Neurological:     General: No focal deficit present.     Mental Status: He is alert and oriented to person, place, and time.           Assessment & Plan:  His CAD is now stable. HTN is stable. For the diabetes he will get back on Metformin XR 500 mg daily and Glipizide 5 mg BID. We will refer him to Nutrition. He will return in one month for another A1c.  Gershon Crane, MD

## 2019-11-20 NOTE — Telephone Encounter (Signed)
TCM Call - Spoke with pt who reports he is on the way to his PCP follow up appointment now after hospital discharge 11/14/2019 from [facility].  Pt reports he is doing well and has no concerns at this time.  Pt states he also had full understanding of his discharge instructions.  Reminded pt he has appointment with Tereso Newcomer PA-C 11/25/2019.  Pt advised to continue medications as prescribed.  Pt verbalizes understanding and agrees with current plan.

## 2019-11-20 NOTE — Telephone Encounter (Signed)
Please advise. This is not on the patients med list. Does he check is BG at home?

## 2019-11-24 NOTE — Progress Notes (Signed)
Cardiology Office Note:    Date:  11/25/2019   ID:  Raymond Lutz, DOB Jun 23, 1965, MRN [id number]  PCP:  Nelwyn Salisbury, MD  [facility] Cardiologist:  Armanda Magic, MD   [facility] Electrophysiologist:  None   Referring MD: Nelwyn Salisbury, MD   Chief Complaint:  Hospitalization Follow-up (s/p NSTEMI tx with DES to LAD; Transitional Care Management visit )    Patient Profile:    Raymond Lutz is a 54 y.o. male with:   Coronary artery disease   S/p PCI of dLAD and OM2 in 2017  S/p NSTEMI tx with DES to mid LCx in 02/2018  S/p NSTEMI 9/21 tx with DES to mLAD   Morbid obesity   OSA  Diabetes mellitus 2  Hyperlipidemia   Hypertension   Peripheral arterial disease   Paroxysmal atrial fibrillation   Patient stopped anticoagulation on his own   Prior CV studies: Cardiac catheterization 11-27-19 LAD mid 90, 20, dist stent patent  LCx mid stent patent; OM2 stent patent  RCA prox 20, mid 30 EF  55 PCI:  3.5 x 15 mm Resolute Onyx DES to mLAD   Echocardiogram November 27, 2019 EF 60-65, no RWMA, normal RVSF, mild LAE  History of Present Illness:    Raymond Lutz was last seen by Dr. Mayford Knife 11/12/19.  He had symptoms of prolonged chest pain earlier in the week and a hs-Trop was elevated.  He was sent to the ED for admission for a NSTEMI.  A cardiac catheterization demonstrated 90% proximal LAD stenosis.  The stent in the distal LAD and LCx extending into the OM were patent.  He underwent PCI with DES to the mid LAD.  An echocardiogram demonstrated preserved LVF.  He returns for follow up.  He is here alone.  Since discharge from the hospital, he has been doing well.  He has not had chest discomfort, syncope, orthopnea, shortness of breath.  He has chronic lower extremity swelling that is overall stable on current dose of furosemide.      Past Medical History:  Diagnosis Date  . Arthritis    "knees" (02/21/2018)  . Asthma   . 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  . Chest pain 03/18/2014  .  Coronary artery disease    a. 2013: cath showing nonobstructive CAD with 50% OM, 20-30% dLAD, 30-40%dRCA  b. 12/2015: NSTEMI w/ 99% stenosis dLAD (DES placed), 99% stenosis 2nd Mrg (DES placed), apical LAD stenosis too small for PCI and mild disease in the RCA.   Marland Kitchen DIABETES MELLITUS, TYPE II 10/17/2006  . ED (erectile dysfunction)   . GERD (gastroesophageal reflux disease)   . History of gout   . History of kidney stones   . HYPERLIPIDEMIA 10/17/2006  . HYPERTENSION 10/17/2006  . NSTEMI (non-ST elevated myocardial infarction) (HCC) 12/16/2015; 02/19/2018  . OBESITY 10/17/2006  . OSA on CPAP    not using CPAP regularly"need a new one" (02/21/2018)  . Paroxysmal atrial fibrillation (HCC)    2006 - was on coumadin - took himself off 6 mos after initiation.  . Shortness of breath     Current Medications: Current Meds  Medication Sig  . albuterol (VENTOLIN HFA) 108 (90 Base) MCG/ACT inhaler Inhale 2 puffs into the lungs every 4 (four) hours as needed.  Marland Kitchen amLODipine (NORVASC) 10 MG tablet Take 1 tablet (10 mg total) by mouth daily.  Marland Kitchen aspirin EC 81 MG tablet Take 1 tablet (81 mg total) by mouth daily.  Marland Kitchen atorvastatin (LIPITOR) 80 MG tablet  Take 1 tablet (80 mg total) by mouth daily.  . carvedilol (COREG) 12.5 MG tablet Take 1 tablet (12.5 mg total) by mouth 2 (two) times daily with a meal.  . ezetimibe (ZETIA) 10 MG tablet Take 1 tablet by mouth once daily  . FREESTYLE LITE test strip USE 1 STRIP TO CHECK GLUCOSE ONCE DAILY  . furosemide (LASIX) 40 MG tablet Take 1 tablet by mouth twice daily  . glipiZIDE (GLUCOTROL) 5 MG tablet Take 1 tablet (5 mg total) by mouth 2 (two) times daily before a meal.  . isosorbide mononitrate (IMDUR) 30 MG 24 hr tablet Take 1 tablet (30 mg total) by mouth daily.  Marland Kitchen losartan (COZAAR) 100 MG tablet Take 1 tablet (100 mg total) by mouth daily.  . metFORMIN (GLUCOPHAGE-XR) 500 MG 24 hr tablet Take 1 tablet (500 mg total) by mouth daily with breakfast.  . nitroGLYCERIN  (NITROSTAT) 0.4 MG SL tablet Place 1 tablet (0.4 mg total) under the tongue every 5 (five) minutes x 3 doses as needed for chest pain.  . potassium chloride SA (KLOR-CON) 20 MEQ tablet TAKE 2 TABLETS BY MOUTH IN THE MORNING  . terbinafine (LAMISIL) 250 MG tablet Take 1 tablet (250 mg total) by mouth daily.  . ticagrelor (BRILINTA) 90 MG TABS tablet Take 1 tablet (90 mg total) by mouth 2 (two) times daily.   Current Facility-Administered Medications for the 11/25/19 encounter (Office Visit) with Tereso Newcomer T, PA-C  Medication  . ondansetron (ZOFRAN-ODT) disintegrating tablet 4 mg     Allergies:   Patient has no known allergies.   Social History   Tobacco Use  . Smoking status: Never Smoker  . Smokeless tobacco: Never Used  Vaping Use  . Vaping Use: Never used  Substance Use Topics  . Alcohol use: Not Currently  . Drug use: No     Family Hx: The patient's family history includes Cerebral aneurysm in his mother; Diabetes in his maternal grandmother; Heart attack in his brother and sister; Hypertension in his brother; Lung cancer in his father.  ROS   EKGs/Labs/Other Test Reviewed:    EKG:  EKG is   ordered today.  The ekg ordered today demonstrates normal sinus rhythm, heart rate 68, normal axis, RSR prime lead V1, QTC 433, no change from prior tracings  Recent Labs: 09/18/2019: TSH 1.08 10/30/2019: ALT 18 11/13/2019: B Natriuretic Peptide 12.6 11/14/2019: BUN 11; Creatinine, Ser 0.94; Hemoglobin 12.6; Magnesium 1.8; Platelets 253; Potassium 4.1; Sodium 136   Recent Lipid Panel Lab Results  Component Value Date/Time   CHOL 118 10/30/2019 08:39 AM   CHOL 177 02/08/2014 04:22 AM   TRIG 42 10/30/2019 08:39 AM   TRIG 147 02/08/2014 04:22 AM   HDL 35 (L) 10/30/2019 08:39 AM   HDL 30 (L) 02/08/2014 04:22 AM   CHOLHDL 3.4 10/30/2019 08:39 AM   CHOLHDL 4 01/28/2019 09:36 AM   LDLCALC 73 10/30/2019 08:39 AM   LDLCALC 118 (H) 02/08/2014 04:22 AM   LDLDIRECT 171.0 06/24/2012 11:46 AM     Physical Exam:    VS:  BP 140/80   Pulse 68   Ht 6\' 2"  (1.88 m)   Wt (!) 374 lb (169.6 kg)   SpO2 96%   BMI 48.02 kg/m     Wt Readings from Last 3 Encounters:  11/25/19 (!) 374 lb (169.6 kg)  11/20/19 (!) 374 lb 4 oz (169.8 kg)  11/14/19 (!) 378 lb 1.6 oz (171.5 kg)     Constitutional:  Appearance: Healthy appearance. Not in distress.  Neck:     Vascular: JVD normal.  Pulmonary:     Effort: Pulmonary effort is normal.     Breath sounds: No wheezing. No rales.  Cardiovascular:     Normal rate. Regular rhythm. Normal S1. Normal S2.     Murmurs: There is no murmur.     Comments: R wrist without hematoma Edema:    Pretibial: bilateral trace edema of the pretibial area. Abdominal:     Palpations: Abdomen is soft.  Skin:    General: Skin is warm and dry.  Neurological:     General: No focal deficit present.     Mental Status: Alert and oriented to person, place and time.     Cranial Nerves: Cranial nerves are intact.       ASSESSMENT & PLAN:    1. NSTEMI (non-ST elevated myocardial infarction) (HCC) Hx of prior stenting to the LAD and LCx.  He is s/p recent NSTEMI tx with DES to the LAD.  His EF is preserved by echocardiogram.  He is overall doing well without anginal symptoms.  We discussed the need to remain on Brilinta for a minimum of 1 year.  I have encouraged him to go to cardiac rehabilitation.  He plans to go to [facility] for his 24th anniversary soon.  He should be fine to go on his trip.  Continue current dose of aspirin, atorvastatin, carvedilol, ezetimibe, isosorbide mononitrate, losartan, ticagrelor.  Follow-up with Dr. Mayford Knife in 3 months.  2. Essential hypertension Blood pressure above goal.  However, he just took his medications prior to coming into the office.  His pressures at home have been 120s-130.  Continue current dose of amlodipine, carvedilol, isosorbide mononitrate and losartan.  3. Hyperlipidemia LDL goal <70 He is on max dose  atorvastatin as well as ezetimibe.  Recent LDL 73.  I will refer him to the lipid clinic for consideration of PCSK9 inhibitor therapy versus bempedoic acid.  4. Controlled type 2 diabetes mellitus with complication, without long-term current use of insulin (HCC) We briefly discussed the CV benefit of empagliflozin.  However, his insurance would not cover this.  Continue follow-up with primary care.    Dispo:  Return in about 3 months (around 02/24/2020) for Routine Follow Up with Dr. Mayford Knife, in person.   Medication Adjustments/Labs and Tests Ordered: Current medicines are reviewed at length with the patient today.  Concerns regarding medicines are outlined above.  Tests Ordered: Orders Placed This Encounter  Procedures  . AMB Referral to [facility]  . EKG 12-Lead   Medication Changes: No orders of the defined types were placed in this encounter.   Signed, Tereso Newcomer, PA-C  11/25/2019 9:32 AM    [facility] [address] [place], [place], [place]  [address] Phone: [phone]; Fax: [phone]

## 2019-11-25 ENCOUNTER — Encounter: Payer: Self-pay | Admitting: Physician Assistant

## 2019-11-25 ENCOUNTER — Other Ambulatory Visit: Payer: Self-pay

## 2019-11-25 ENCOUNTER — Ambulatory Visit (INDEPENDENT_AMBULATORY_CARE_PROVIDER_SITE_OTHER): Payer: BC Managed Care – PPO | Admitting: Physician Assistant

## 2019-11-25 VITALS — BP 140/80 | HR 68 | Ht 74.0 in | Wt 374.0 lb

## 2019-11-25 DIAGNOSIS — I214 Non-ST elevation (NSTEMI) myocardial infarction: Secondary | ICD-10-CM

## 2019-11-25 DIAGNOSIS — I10 Essential (primary) hypertension: Secondary | ICD-10-CM

## 2019-11-25 DIAGNOSIS — E785 Hyperlipidemia, unspecified: Secondary | ICD-10-CM

## 2019-11-25 DIAGNOSIS — E118 Type 2 diabetes mellitus with unspecified complications: Secondary | ICD-10-CM | POA: Diagnosis not present

## 2019-11-25 MED ORDER — NITROGLYCERIN 0.4 MG SL SUBL
0.4000 mg | SUBLINGUAL_TABLET | SUBLINGUAL | 12 refills | Status: DC | PRN
Start: 2019-11-25 — End: 2020-12-01

## 2019-11-25 NOTE — Patient Instructions (Signed)
Medication Instructions:  Your physician recommends that you continue on your current medications as directed. Please refer to the Current Medication list given to you today.  *If you need a refill on your cardiac medications before your next appointment, please call your pharmacy*  Lab Work: None ordered today  If you have labs (blood work) drawn today and your tests are completely normal, you will receive your results only by: Marland Kitchen MyChart Message (if you have MyChart) OR . A paper copy in the mail If you have any lab test that is abnormal or we need to change your treatment, we will call you to review the results.  Testing/Procedures: None ordered today  Follow-Up: On 03/08/20 at 9:00AM with Armanda Magic, MD

## 2019-11-25 NOTE — Addendum Note (Signed)
Addended byAlben Spittle, Lorin Picket T on: 11/25/2019 09:34 AM   Modules accepted: Orders

## 2019-11-26 ENCOUNTER — Ambulatory Visit (HOSPITAL_COMMUNITY): Payer: BC Managed Care – PPO

## 2019-11-27 ENCOUNTER — Ambulatory Visit (HOSPITAL_COMMUNITY): Payer: BC Managed Care – PPO

## 2019-11-27 ENCOUNTER — Telehealth (HOSPITAL_COMMUNITY): Payer: Self-pay

## 2019-11-27 NOTE — Telephone Encounter (Signed)
Attempted to call patient in regards to Cardiac Rehab - LM on VM 

## 2019-11-27 NOTE — Telephone Encounter (Signed)
Pt insurance is active and benefits verified through [place]. Co-pay $0.00, DED $250.00/$250.00 met, out of pocket $3,600.00/$1,303.72 met, co-insurance 10%. No pre-authorization required. Passport, 11/27/19 @ 2:54PM, JZP#[id number]  Will contact patient to see if he is interested in the Cardiac Rehab Program. If interested, patient will need to complete follow up appt. Once completed, patient will be contacted for scheduling upon review by the RN Navigator.

## 2019-11-30 ENCOUNTER — Other Ambulatory Visit: Payer: Self-pay | Admitting: Family Medicine

## 2019-11-30 DIAGNOSIS — Z760 Encounter for issue of repeat prescription: Secondary | ICD-10-CM

## 2019-11-30 DIAGNOSIS — I10 Essential (primary) hypertension: Secondary | ICD-10-CM

## 2019-12-08 ENCOUNTER — Telehealth (HOSPITAL_COMMUNITY): Payer: Self-pay

## 2019-12-08 ENCOUNTER — Encounter (HOSPITAL_COMMUNITY): Payer: Self-pay

## 2019-12-08 NOTE — Telephone Encounter (Signed)
Attempted to call patient in regards to Cardiac Rehab - LM on VM Mailed letter 

## 2019-12-12 ENCOUNTER — Encounter: Payer: Self-pay | Admitting: General Practice

## 2019-12-14 ENCOUNTER — Other Ambulatory Visit: Payer: Self-pay | Admitting: Family Medicine

## 2019-12-14 DIAGNOSIS — Z760 Encounter for issue of repeat prescription: Secondary | ICD-10-CM

## 2019-12-14 DIAGNOSIS — I10 Essential (primary) hypertension: Secondary | ICD-10-CM

## 2019-12-23 NOTE — Telephone Encounter (Signed)
Spoke to patient who requested we email the form. [employer] form emailed 12/23/19

## 2019-12-26 ENCOUNTER — Telehealth (HOSPITAL_COMMUNITY): Payer: Self-pay

## 2019-12-26 NOTE — Telephone Encounter (Signed)
Unable to reach pt in regards to Cardiac Rehab Closed referral 

## 2020-01-04 ENCOUNTER — Other Ambulatory Visit: Payer: Self-pay | Admitting: Family Medicine

## 2020-01-04 DIAGNOSIS — Z760 Encounter for issue of repeat prescription: Secondary | ICD-10-CM

## 2020-01-04 DIAGNOSIS — I10 Essential (primary) hypertension: Secondary | ICD-10-CM

## 2020-02-02 ENCOUNTER — Emergency Department (HOSPITAL_COMMUNITY)
Admission: EM | Admit: 2020-02-02 | Discharge: 2020-02-02 | Disposition: A | Discharge status: Home / Self Care | Payer: BC Managed Care – PPO | Attending: Emergency Medicine | Admitting: Emergency Medicine

## 2020-02-02 ENCOUNTER — Other Ambulatory Visit: Payer: Self-pay

## 2020-02-02 ENCOUNTER — Emergency Department (HOSPITAL_COMMUNITY): Payer: BC Managed Care – PPO

## 2020-02-02 ENCOUNTER — Telehealth: Payer: Self-pay | Admitting: Cardiology

## 2020-02-02 ENCOUNTER — Encounter (HOSPITAL_COMMUNITY): Payer: Self-pay | Admitting: Emergency Medicine

## 2020-02-02 DIAGNOSIS — J45909 Unspecified asthma, uncomplicated: Secondary | ICD-10-CM | POA: Insufficient documentation

## 2020-02-02 DIAGNOSIS — Z7984 Long term (current) use of oral hypoglycemic drugs: Secondary | ICD-10-CM | POA: Insufficient documentation

## 2020-02-02 DIAGNOSIS — I10 Essential (primary) hypertension: Secondary | ICD-10-CM | POA: Diagnosis not present

## 2020-02-02 DIAGNOSIS — I2510 Atherosclerotic heart disease of native coronary artery without angina pectoris: Secondary | ICD-10-CM | POA: Diagnosis not present

## 2020-02-02 DIAGNOSIS — R079 Chest pain, unspecified: Secondary | ICD-10-CM | POA: Diagnosis not present

## 2020-02-02 DIAGNOSIS — Z79899 Other long term (current) drug therapy: Secondary | ICD-10-CM | POA: Diagnosis not present

## 2020-02-02 DIAGNOSIS — Z7982 Long term (current) use of aspirin: Secondary | ICD-10-CM | POA: Diagnosis not present

## 2020-02-02 DIAGNOSIS — E119 Type 2 diabetes mellitus without complications: Secondary | ICD-10-CM | POA: Diagnosis not present

## 2020-02-02 DIAGNOSIS — Z955 Presence of coronary angioplasty implant and graft: Secondary | ICD-10-CM | POA: Diagnosis not present

## 2020-02-02 LAB — CBC
HCT: 41.8 % (ref 39.0–52.0)
Hemoglobin: 12.8 g/dL — ABNORMAL LOW (ref 13.0–17.0)
MCH: 27.2 pg (ref 26.0–34.0)
MCHC: 30.6 g/dL (ref 30.0–36.0)
MCV: 88.9 fL (ref 80.0–100.0)
Platelets: 253 10*3/uL (ref 150–400)
RBC: 4.7 MIL/uL (ref 4.22–5.81)
RDW: 14.8 % (ref 11.5–15.5)
WBC: 5 10*3/uL (ref 4.0–10.5)
nRBC: 0 % (ref 0.0–0.2)

## 2020-02-02 LAB — BASIC METABOLIC PANEL
Anion gap: 10 (ref 5–15)
BUN: 13 mg/dL (ref 6–20)
CO2: 26 mmol/L (ref 22–32)
Calcium: 9.3 mg/dL (ref 8.9–10.3)
Chloride: 104 mmol/L (ref 98–111)
Creatinine, Ser: 0.83 mg/dL (ref 0.61–1.24)
GFR, Estimated: >60 mL/min (ref >60)
Glucose, Bld: 110 mg/dL — ABNORMAL HIGH (ref 70–99)
Potassium: 3.8 mmol/L (ref 3.5–5.1)
Sodium: 140 mmol/L (ref 135–145)

## 2020-02-02 LAB — TROPONIN I (HIGH SENSITIVITY)
Troponin I (High Sensitivity): 11 ng/L (ref <18)
Troponin I (High Sensitivity): 11 ng/L (ref <18)

## 2020-02-02 MED ORDER — ALUM & MAG HYDROXIDE-SIMETH 200-200-20 MG/5ML PO SUSP
30.0000 mL | Freq: Once | ORAL | Status: AC
  Administered 2020-02-02: 30 mL via ORAL
  Filled 2020-02-02: qty 30

## 2020-02-02 MED ORDER — LIDOCAINE VISCOUS HCL 2 % MT SOLN
15.0000 mL | Freq: Once | OROMUCOSAL | Status: AC
  Administered 2020-02-02: 15 mL via ORAL
  Filled 2020-02-02: qty 15

## 2020-02-02 MED ORDER — CARVEDILOL 3.125 MG PO TABS
12.5000 mg | ORAL_TABLET | Freq: Two times a day (BID) | ORAL | Status: DC
  Administered 2020-02-02: 12.5 mg via ORAL
  Filled 2020-02-02: qty 4

## 2020-02-02 MED ORDER — TICAGRELOR 90 MG PO TABS: 90.0000 mg | ORAL_TABLET | Freq: Two times a day (BID) | ORAL | Status: DC

## 2020-02-02 MED ORDER — HYDROMORPHONE HCL 1 MG/ML IJ SOLN
1.0000 mg | Freq: Once | INTRAMUSCULAR | Status: DC
  Filled 2020-02-02: qty 1

## 2020-02-02 NOTE — Telephone Encounter (Signed)
Patient is calling in due to experiencing chest pain for the past few hours. Patient describes it as a discomfort, tightness in the middle of his chest. He has not felt pain like this before. Pt checked BP while on the phone with this RN; 156/80 HR 65. Patient denies SOB, N/V, diaphoresis. Pt did take one Nitroglycerin tablet about 30 minutes ago with minimal relief.   Pt states he celebrated his birthday this weekend with lots of [place] food and is thinking this discomfort may be indigestion.   Considering the patients extensive cardiac history and recent NSTEMI in September, advised the patient to go to the ED in order to be evaluated in person. He verbalized understanding and is agreeable to plan.

## 2020-02-02 NOTE — Telephone Encounter (Signed)
Pt c/o of Chest Pain: STAT if CP now or developed within 24 hours  1. Are you having CP right now? yes  2. Are you experiencing any other symptoms (ex. SOB, nausea, vomiting, sweating)? no  3. How long have you been experiencing CP? This morning since 6am  4. Is your CP continuous or coming and going? continuous   5. Have you taken Nitroglycerin? Yes, took one   Patient states he started having a chest discomfort this morning. He states he is not sure if pulled something or if it is gas.  ?
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
## 2020-02-02 NOTE — ED Notes (Signed)
2nd Troponin

## 2020-02-02 NOTE — Telephone Encounter (Signed)
54 yo M with prox-mid LAD disease s/p recent PCI with chest pain different that his angina.  Had recent spicy food intake.  CP not improved by nitro.  Novel Troponin < 18.  No ECG changes.  Patient and ED feel it is reasonable for DC given resolution of pain and no evidence of high risk ECG/Biomarkers. Agree with this. Will need primary cardiology follow up.  Christell Constant, MD

## 2020-02-02 NOTE — ED Triage Notes (Signed)
Pt reports cp, onset last night. Denies radiation, hx of cardiac stents. Reports intake of large amount of spicy foods all weekend. A/ox4, resp e/u, nad.

## 2020-03-02 ENCOUNTER — Other Ambulatory Visit: Payer: Self-pay | Admitting: Family Medicine

## 2020-03-02 DIAGNOSIS — Z760 Encounter for issue of repeat prescription: Secondary | ICD-10-CM

## 2020-03-02 DIAGNOSIS — I10 Essential (primary) hypertension: Secondary | ICD-10-CM

## 2020-03-08 ENCOUNTER — Ambulatory Visit: Payer: BC Managed Care – PPO | Admitting: Cardiology

## 2020-03-10 ENCOUNTER — Encounter: Payer: Self-pay | Admitting: Family Medicine

## 2020-03-10 DIAGNOSIS — E119 Type 2 diabetes mellitus without complications: Secondary | ICD-10-CM | POA: Diagnosis not present

## 2020-03-10 DIAGNOSIS — U071 COVID-19: Secondary | ICD-10-CM | POA: Diagnosis not present

## 2020-03-10 DIAGNOSIS — I2510 Atherosclerotic heart disease of native coronary artery without angina pectoris: Secondary | ICD-10-CM | POA: Diagnosis not present

## 2020-03-16 DIAGNOSIS — Z23 Encounter for immunization: Secondary | ICD-10-CM | POA: Diagnosis not present

## 2020-03-16 DIAGNOSIS — U071 COVID-19: Secondary | ICD-10-CM | POA: Diagnosis not present

## 2020-03-16 DIAGNOSIS — J1282 Pneumonia due to coronavirus disease 2019: Secondary | ICD-10-CM | POA: Diagnosis not present

## 2020-03-16 DIAGNOSIS — E1121 Type 2 diabetes mellitus with diabetic nephropathy: Secondary | ICD-10-CM | POA: Diagnosis not present

## 2020-03-16 NOTE — Telephone Encounter (Signed)
It looks like he is seeing the [facility] today

## 2020-04-02 ENCOUNTER — Other Ambulatory Visit: Payer: Self-pay

## 2020-04-02 MED ORDER — EZETIMIBE 10 MG PO TABS
10.0000 mg | ORAL_TABLET | Freq: Every day | ORAL | 2 refills | Status: DC
Start: 2020-04-02 — End: 2020-12-30

## 2020-04-02 NOTE — Telephone Encounter (Signed)
Pt's medication was sent to pt's pharmacy as requested. Confirmation received.  °

## 2020-04-18 ENCOUNTER — Other Ambulatory Visit: Payer: Self-pay | Admitting: Family Medicine

## 2020-04-18 DIAGNOSIS — I10 Essential (primary) hypertension: Secondary | ICD-10-CM

## 2020-04-18 DIAGNOSIS — Z760 Encounter for issue of repeat prescription: Secondary | ICD-10-CM

## 2020-04-19 ENCOUNTER — Other Ambulatory Visit: Payer: Self-pay | Admitting: Family Medicine

## 2020-04-19 DIAGNOSIS — Z760 Encounter for issue of repeat prescription: Secondary | ICD-10-CM

## 2020-04-19 DIAGNOSIS — I10 Essential (primary) hypertension: Secondary | ICD-10-CM

## 2020-04-19 MED ORDER — FUROSEMIDE 40 MG PO TABS: 40.0000 mg | ORAL_TABLET | Freq: Two times a day (BID) | ORAL | 0 refills | Status: DC

## 2020-04-26 ENCOUNTER — Ambulatory Visit (HOSPITAL_COMMUNITY)
Admission: EM | Admit: 2020-04-26 | Discharge: 2020-04-26 | Disposition: A | Discharge status: Home / Self Care | Payer: BC Managed Care – PPO | Attending: Urgent Care | Admitting: Urgent Care

## 2020-04-26 ENCOUNTER — Encounter (HOSPITAL_COMMUNITY): Payer: Self-pay | Admitting: *Deleted

## 2020-04-26 ENCOUNTER — Emergency Department (HOSPITAL_COMMUNITY): Payer: BC Managed Care – PPO

## 2020-04-26 ENCOUNTER — Encounter (HOSPITAL_COMMUNITY): Payer: Self-pay | Admitting: Emergency Medicine

## 2020-04-26 ENCOUNTER — Emergency Department (HOSPITAL_COMMUNITY)
Admission: EM | Admit: 2020-04-26 | Discharge: 2020-04-26 | Disposition: A | Discharge status: Home / Self Care | Payer: BC Managed Care – PPO | Attending: Emergency Medicine | Admitting: Emergency Medicine

## 2020-04-26 ENCOUNTER — Other Ambulatory Visit: Payer: Self-pay

## 2020-04-26 DIAGNOSIS — Z96652 Presence of left artificial knee joint: Secondary | ICD-10-CM | POA: Diagnosis not present

## 2020-04-26 DIAGNOSIS — Z7984 Long term (current) use of oral hypoglycemic drugs: Secondary | ICD-10-CM | POA: Diagnosis not present

## 2020-04-26 DIAGNOSIS — Z955 Presence of coronary angioplasty implant and graft: Secondary | ICD-10-CM | POA: Insufficient documentation

## 2020-04-26 DIAGNOSIS — E119 Type 2 diabetes mellitus without complications: Secondary | ICD-10-CM | POA: Insufficient documentation

## 2020-04-26 DIAGNOSIS — J45909 Unspecified asthma, uncomplicated: Secondary | ICD-10-CM | POA: Insufficient documentation

## 2020-04-26 DIAGNOSIS — R1032 Left lower quadrant pain: Secondary | ICD-10-CM | POA: Diagnosis not present

## 2020-04-26 DIAGNOSIS — I10 Essential (primary) hypertension: Secondary | ICD-10-CM | POA: Diagnosis not present

## 2020-04-26 DIAGNOSIS — Z7982 Long term (current) use of aspirin: Secondary | ICD-10-CM | POA: Insufficient documentation

## 2020-04-26 DIAGNOSIS — K5792 Diverticulitis of intestine, part unspecified, without perforation or abscess without bleeding: Secondary | ICD-10-CM | POA: Diagnosis not present

## 2020-04-26 DIAGNOSIS — Z8679 Personal history of other diseases of the circulatory system: Secondary | ICD-10-CM | POA: Insufficient documentation

## 2020-04-26 DIAGNOSIS — R109 Unspecified abdominal pain: Secondary | ICD-10-CM | POA: Diagnosis not present

## 2020-04-26 DIAGNOSIS — Z79899 Other long term (current) drug therapy: Secondary | ICD-10-CM | POA: Diagnosis not present

## 2020-04-26 DIAGNOSIS — Z87442 Personal history of urinary calculi: Secondary | ICD-10-CM | POA: Diagnosis not present

## 2020-04-26 DIAGNOSIS — I2510 Atherosclerotic heart disease of native coronary artery without angina pectoris: Secondary | ICD-10-CM | POA: Diagnosis not present

## 2020-04-26 LAB — COMPREHENSIVE METABOLIC PANEL
ALT: 22 U/L (ref 0–44)
AST: 19 U/L (ref 15–41)
Albumin: 3.6 g/dL (ref 3.5–5.0)
Alkaline Phosphatase: 47 U/L (ref 38–126)
Anion gap: 8 (ref 5–15)
BUN: 13 mg/dL (ref 6–20)
CO2: 23 mmol/L (ref 22–32)
Calcium: 9 mg/dL (ref 8.9–10.3)
Chloride: 106 mmol/L (ref 98–111)
Creatinine, Ser: 0.77 mg/dL (ref 0.61–1.24)
GFR, Estimated: >60 mL/min (ref >60)
Glucose, Bld: 97 mg/dL (ref 70–99)
Potassium: 4 mmol/L (ref 3.5–5.1)
Sodium: 137 mmol/L (ref 135–145)
Total Bilirubin: 0.8 mg/dL (ref 0.3–1.2)
Total Protein: 6.5 g/dL (ref 6.5–8.1)

## 2020-04-26 LAB — URINALYSIS, ROUTINE W REFLEX MICROSCOPIC
Bacteria, UA: NONE SEEN
Bilirubin Urine: NEGATIVE
Glucose, UA: NEGATIVE mg/dL
Ketones, ur: NEGATIVE mg/dL
Leukocytes,Ua: NEGATIVE
Nitrite: NEGATIVE
Protein, ur: NEGATIVE mg/dL
Specific Gravity, Urine: 1.026 (ref 1.005–1.030)
pH: 7 (ref 5.0–8.0)

## 2020-04-26 LAB — CBC
HCT: 39.5 % (ref 39.0–52.0)
Hemoglobin: 12.5 g/dL — ABNORMAL LOW (ref 13.0–17.0)
MCH: 27.2 pg (ref 26.0–34.0)
MCHC: 31.6 g/dL (ref 30.0–36.0)
MCV: 85.9 fL (ref 80.0–100.0)
Platelets: 272 10*3/uL (ref 150–400)
RBC: 4.6 MIL/uL (ref 4.22–5.81)
RDW: 15 % (ref 11.5–15.5)
WBC: 6.5 10*3/uL (ref 4.0–10.5)
nRBC: 0 % (ref 0.0–0.2)

## 2020-04-26 LAB — LIPASE, BLOOD: Lipase: 39 U/L (ref 11–51)

## 2020-04-26 MED ORDER — HYDROCODONE-ACETAMINOPHEN 5-325 MG PO TABS
ORAL_TABLET | ORAL | Status: AC
  Filled 2020-04-26: qty 1

## 2020-04-26 MED ORDER — HYDROCODONE-ACETAMINOPHEN 5-325 MG PO TABS
1.0000 | ORAL_TABLET | Freq: Once | ORAL | Status: AC
  Administered 2020-04-26: 1 via ORAL

## 2020-04-26 MED ORDER — ONDANSETRON 4 MG PO TBDP: 4.0000 mg | ORAL_TABLET | Freq: Three times a day (TID) | ORAL | 0 refills | Status: DC | PRN

## 2020-04-26 MED ORDER — AMOXICILLIN-POT CLAVULANATE 875-125 MG PO TABS: 1.0000 | ORAL_TABLET | Freq: Three times a day (TID) | ORAL | 0 refills | Status: DC

## 2020-04-26 MED ORDER — IOHEXOL 300 MG/ML  SOLN
100.0000 mL | Freq: Once | INTRAMUSCULAR | Status: AC | PRN
  Administered 2020-04-26: 100 mL via INTRAVENOUS

## 2020-04-26 MED ORDER — DICYCLOMINE HCL 20 MG PO TABS: 20.0000 mg | ORAL_TABLET | Freq: Two times a day (BID) | ORAL | 0 refills | Status: DC

## 2020-04-26 MED ORDER — OXYCODONE HCL 5 MG PO TABS
5.0000 mg | ORAL_TABLET | ORAL | 0 refills | Status: DC | PRN
Start: 2020-04-26 — End: 2021-05-12

## 2020-04-26 NOTE — Discharge Instructions (Signed)
Please make sure you head to the emergency department now as you are in need of further imaging studies such as a CT scan and labs that we cannot provide. My primary concern is that you have an acute abdomen or acute diverticulitis.

## 2020-04-26 NOTE — ED Notes (Signed)
4 Iv sticks attempted w/o success. Consult placed.

## 2020-04-26 NOTE — ED Notes (Signed)
IV attempted without success. 

## 2020-04-26 NOTE — ED Triage Notes (Signed)
Pt presents with left side abdominal pain. States started on Saturday, eased off yesterday and woke him up out of sleep this am. States has had trouble with constipation for 3-4 days.

## 2020-04-26 NOTE — ED Notes (Signed)
Patient is being discharged from the Urgent Care and sent to the Emergency Department via POV . Per Marlowe Shores, PA, patient is in need of higher level of care due to Severe abdominal pain and need for imaging. Patient is aware and verbalizes understanding of plan of care.  Vitals:   04/26/20 1010  BP: (!) 174/83  Pulse: 71  Resp: 20  Temp: 98.3 F (36.8 C)  SpO2: 97%

## 2020-04-26 NOTE — ED Triage Notes (Signed)
Pt reports LLQ pain that started on Saturday but more severe this am. Had mild constipation past several days but denies n/v.
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
## 2020-04-26 NOTE — ED Notes (Signed)
Called CT to let them know pt has a line now 

## 2020-04-28 ENCOUNTER — Other Ambulatory Visit: Payer: Self-pay

## 2020-04-29 ENCOUNTER — Ambulatory Visit: Payer: BC Managed Care – PPO | Admitting: Family Medicine

## 2020-04-29 ENCOUNTER — Encounter: Payer: Self-pay | Admitting: Family Medicine

## 2020-04-29 ENCOUNTER — Other Ambulatory Visit: Payer: Self-pay

## 2020-04-29 VITALS — BP 150/84 | HR 62 | Temp 98.0°F | Wt 374.2 lb

## 2020-04-29 DIAGNOSIS — K5732 Diverticulitis of large intestine without perforation or abscess without bleeding: Secondary | ICD-10-CM | POA: Diagnosis not present

## 2020-04-29 MED ORDER — CIPROFLOXACIN HCL 500 MG PO TABS: 500.0000 mg | ORAL_TABLET | Freq: Two times a day (BID) | ORAL | 0 refills | Status: DC

## 2020-04-29 MED ORDER — METRONIDAZOLE 500 MG PO TABS: 500.0000 mg | ORAL_TABLET | Freq: Three times a day (TID) | ORAL | 0 refills | Status: DC

## 2020-04-29 NOTE — Progress Notes (Signed)
° °  Subjective:    Patient ID: Raymond Lutz, male    DOB: Jul 12, 1965, 55 y.o.   MRN: [id number]  HPI Here to follow up an ER visit on 04-26-20 for LLQ abdominal pains. These started about 6 days ago. He has never had this before. He has nevr had a colonoscopy. The pains are severe and sharp, and they wax and wane. No fevers. No nausea or vomiting. His BMs are normal, including one this morning. Passing a stool does not affect the pain at all. No urinary symptoms. At the ER his labs were normal, including a WBC of 6.5. a CT scan showed likely diverticulitis. He was started on Augmentin and he was given a fe Norco for pain. Since then he has not improved at all. He still has severe pains in the LLQ and left flank. He is eating a light diet such as toast and rice.    Review of Systems  Constitutional: Negative.   Respiratory: Negative.   Cardiovascular: Negative.   Gastrointestinal: Positive for abdominal pain. Negative for abdominal distention, anal bleeding, blood in stool, constipation, diarrhea, nausea, rectal pain and vomiting.  Genitourinary: Positive for flank pain. Negative for difficulty urinating, dysuria, frequency, hematuria, testicular pain and urgency.       Objective:   Physical Exam Constitutional:      Comments: In pain   Cardiovascular:     Rate and Rhythm: Normal rate and regular rhythm.     Pulses: Normal pulses.     Heart sounds: Normal heart sounds.  Pulmonary:     Effort: Pulmonary effort is normal.     Breath sounds: Normal breath sounds.  Abdominal:     General: Abdomen is flat. Bowel sounds are normal. There is no distension.     Palpations: Abdomen is soft. There is no mass.     Tenderness: There is no guarding.     Hernia: No hernia is present.     Comments: He is quite tender with positive rebound in the left flank and LLQ   Neurological:     Mental Status: He is alert.           Assessment & Plan:  Diverticulitis. We will stop the Augmentin and  start him on Cipro and Flagyl for 10 days. Written out of work until 05-03-20. Recheck as needed.  Gershon Crane, MD

## 2020-05-24 ENCOUNTER — Other Ambulatory Visit: Payer: Self-pay | Admitting: Family Medicine

## 2020-05-24 ENCOUNTER — Other Ambulatory Visit: Payer: Self-pay | Admitting: Cardiology

## 2020-05-24 DIAGNOSIS — I10 Essential (primary) hypertension: Secondary | ICD-10-CM

## 2020-05-24 DIAGNOSIS — Z760 Encounter for issue of repeat prescription: Secondary | ICD-10-CM

## 2020-06-14 ENCOUNTER — Ambulatory Visit: Payer: BC Managed Care – PPO | Admitting: Family Medicine

## 2020-06-14 ENCOUNTER — Other Ambulatory Visit: Payer: Self-pay | Admitting: Cardiology

## 2020-06-16 ENCOUNTER — Other Ambulatory Visit: Payer: Self-pay | Admitting: Medical

## 2020-06-28 ENCOUNTER — Encounter: Payer: Self-pay | Admitting: Family Medicine

## 2020-06-28 ENCOUNTER — Telehealth (INDEPENDENT_AMBULATORY_CARE_PROVIDER_SITE_OTHER): Payer: BC Managed Care – PPO | Admitting: Family Medicine

## 2020-06-28 VITALS — Temp 99.8°F

## 2020-06-28 DIAGNOSIS — J069 Acute upper respiratory infection, unspecified: Secondary | ICD-10-CM

## 2020-06-28 MED ORDER — FLUTICASONE PROPIONATE 50 MCG/ACT NA SUSP: 1.0000 | Freq: Every day | NASAL | 0 refills | Status: AC

## 2020-06-28 NOTE — Progress Notes (Signed)
Virtual Visit via Telephone Note Call started via video, however this provider unable to hear pt.  Patient called to complete visit.  I connected with Raymond Lutz on 06/28/20 at  9:30 AM EDT by telephone and verified that I am speaking with the correct person using two identifiers.   I discussed the limitations, risks, security and privacy concerns of performing an evaluation and management service by telephone and the availability of in person appointments. I also discussed with the patient that there may be a patient responsible charge related to this service. The patient expressed understanding and agreed to proceed.  Location patient: home Location provider: work or home office Participants present for the call: patient, provider Patient did not have a visit in the prior 7 days to address this/these issue(s).   History of Present Illness: Pt woke up this am with an elevated temp of 100, nasal congestion, HA, throat discomfort, nasal drainage, ear pressure.  Notes h/o allergies that have been really bad.  Taking Allegra.  Also tried Tylenol.  Denies cough, n/v, sick contacts.  Took a home COVID test which was negative.    Observations/Objective: Patient sounds cheerful and well on the phone. I do not appreciate any SOB. Speech and thought processing are grossly intact. Patient reported vitals: 99.53F  Assessment and Plan: Viral URI  -Home COVID test negative -supportive care including Tylenol, rest, hydration -Given precautions -Given note for work - Plan: fluticasone (FLONASE) 50 MCG/ACT nasal spray  Follow Up Instructions: prn   [id number] 5-10 [id number] 11-20 9443 21-30 I did not refer this patient for an OV in the next 24 hours for this/these issue(s).  I discussed the assessment and treatment plan with the patient. The patient was provided an opportunity to ask questions and all were answered. The patient agreed with the plan and demonstrated an understanding of the  instructions.   The patient was advised to call back or seek an in-person evaluation if the symptoms worsen or if the condition fails to improve as anticipated.  I provided 5 minutes of non-face-to-face time during this encounter.   Deeann Saint, MD

## 2020-07-16 ENCOUNTER — Other Ambulatory Visit: Payer: Self-pay | Admitting: Cardiology

## 2020-07-21 ENCOUNTER — Encounter: Payer: Self-pay | Admitting: Family Medicine

## 2020-07-21 NOTE — Telephone Encounter (Signed)
The letter is ready  

## 2020-07-25 ENCOUNTER — Other Ambulatory Visit: Payer: Self-pay | Admitting: Family Medicine

## 2020-07-25 DIAGNOSIS — I10 Essential (primary) hypertension: Secondary | ICD-10-CM

## 2020-07-25 DIAGNOSIS — Z760 Encounter for issue of repeat prescription: Secondary | ICD-10-CM

## 2020-07-26 ENCOUNTER — Other Ambulatory Visit: Payer: Self-pay

## 2020-07-26 DIAGNOSIS — Z760 Encounter for issue of repeat prescription: Secondary | ICD-10-CM

## 2020-07-26 DIAGNOSIS — I10 Essential (primary) hypertension: Secondary | ICD-10-CM

## 2020-07-26 MED ORDER — FUROSEMIDE 40 MG PO TABS: 40.0000 mg | ORAL_TABLET | Freq: Two times a day (BID) | ORAL | 0 refills | Status: DC

## 2020-08-12 ENCOUNTER — Other Ambulatory Visit: Payer: Self-pay | Admitting: Family Medicine

## 2020-08-12 DIAGNOSIS — J4520 Mild intermittent asthma, uncomplicated: Secondary | ICD-10-CM

## 2020-08-30 ENCOUNTER — Other Ambulatory Visit: Payer: Self-pay | Admitting: Family Medicine

## 2020-08-30 DIAGNOSIS — Z760 Encounter for issue of repeat prescription: Secondary | ICD-10-CM

## 2020-08-30 DIAGNOSIS — I10 Essential (primary) hypertension: Secondary | ICD-10-CM

## 2020-09-01 MED ORDER — POTASSIUM CHLORIDE CRYS ER 20 MEQ PO TBCR: EXTENDED_RELEASE_TABLET | ORAL | 0 refills | Status: DC

## 2020-10-14 DIAGNOSIS — M25561 Pain in right knee: Secondary | ICD-10-CM | POA: Diagnosis not present

## 2020-10-14 DIAGNOSIS — M25562 Pain in left knee: Secondary | ICD-10-CM | POA: Diagnosis not present

## 2020-10-25 ENCOUNTER — Other Ambulatory Visit: Payer: Self-pay | Admitting: Family Medicine

## 2020-10-25 ENCOUNTER — Other Ambulatory Visit: Payer: Self-pay | Admitting: Cardiology

## 2020-10-25 DIAGNOSIS — Z760 Encounter for issue of repeat prescription: Secondary | ICD-10-CM

## 2020-10-25 DIAGNOSIS — I10 Essential (primary) hypertension: Secondary | ICD-10-CM

## 2020-10-27 ENCOUNTER — Other Ambulatory Visit: Payer: Self-pay | Admitting: *Deleted

## 2020-10-27 MED ORDER — ISOSORBIDE MONONITRATE ER 30 MG PO TB24: 30.0000 mg | ORAL_TABLET | Freq: Every day | ORAL | 0 refills | Status: DC

## 2020-11-04 ENCOUNTER — Telehealth: Payer: Self-pay | Admitting: Cardiology

## 2020-11-04 ENCOUNTER — Other Ambulatory Visit: Payer: Self-pay | Admitting: Medical

## 2020-11-04 ENCOUNTER — Other Ambulatory Visit: Payer: Self-pay | Admitting: Cardiology

## 2020-11-04 ENCOUNTER — Other Ambulatory Visit: Payer: Self-pay | Admitting: Family Medicine

## 2020-11-04 MED ORDER — TICAGRELOR 90 MG PO TABS: 90.0000 mg | ORAL_TABLET | Freq: Two times a day (BID) | ORAL | 3 refills | Status: DC

## 2020-11-04 MED ORDER — AMLODIPINE BESYLATE 10 MG PO TABS: 10.0000 mg | ORAL_TABLET | Freq: Every day | ORAL | 5 refills | Status: DC

## 2020-11-04 NOTE — Telephone Encounter (Signed)
Refill for Brilinta and Amlodipine has been sent to [facility].

## 2020-11-04 NOTE — Telephone Encounter (Signed)
  *  STAT* If patient is at the pharmacy, call can be transferred to refill team.   1. Which medications need to be refilled? (please list name of each medication and dose if known)   amLODipine (NORVASC) 10 MG tablet    ticagrelor (BRILINTA) 90 MG TABS tablet   2. Which pharmacy/location (including street and city if local pharmacy) is medication to be sent to? [facility] - [place], [place] - [address]  3. Do they need a 30 day or 90 day supply? 90 days  Pt will be going on a vacation and needs refill today, he also made an appt with Dr. Mayford Knife on 02/10/21

## 2020-11-30 ENCOUNTER — Other Ambulatory Visit: Payer: Self-pay | Admitting: Family Medicine

## 2020-11-30 DIAGNOSIS — I10 Essential (primary) hypertension: Secondary | ICD-10-CM

## 2020-11-30 DIAGNOSIS — Z760 Encounter for issue of repeat prescription: Secondary | ICD-10-CM

## 2020-12-01 ENCOUNTER — Other Ambulatory Visit: Payer: Self-pay | Admitting: Physician Assistant

## 2020-12-09 ENCOUNTER — Other Ambulatory Visit: Payer: Self-pay | Admitting: Physician Assistant

## 2020-12-28 ENCOUNTER — Encounter (INDEPENDENT_AMBULATORY_CARE_PROVIDER_SITE_OTHER): Payer: Self-pay

## 2020-12-29 ENCOUNTER — Other Ambulatory Visit: Payer: Self-pay

## 2020-12-30 ENCOUNTER — Encounter: Payer: Self-pay | Admitting: Family Medicine

## 2020-12-30 ENCOUNTER — Other Ambulatory Visit: Payer: Self-pay | Admitting: Cardiology

## 2020-12-30 ENCOUNTER — Ambulatory Visit: Payer: BC Managed Care – PPO | Admitting: Family Medicine

## 2020-12-30 VITALS — BP 132/78 | HR 63 | Temp 98.1°F | Ht 73.0 in | Wt 394.0 lb

## 2020-12-30 DIAGNOSIS — M545 Low back pain, unspecified: Secondary | ICD-10-CM | POA: Diagnosis not present

## 2020-12-30 DIAGNOSIS — N451 Epididymitis: Secondary | ICD-10-CM | POA: Diagnosis not present

## 2020-12-30 LAB — POC URINALSYSI DIPSTICK (AUTOMATED)
Bilirubin, UA: NEGATIVE
Glucose, UA: NEGATIVE
Ketones, UA: NEGATIVE
Leukocytes, UA: NEGATIVE
Nitrite, UA: NEGATIVE
Protein, UA: NEGATIVE
Spec Grav, UA: 1.015 (ref 1.010–1.025)
Urobilinogen, UA: 0.2 E.U./dL
pH, UA: 6.5 (ref 5.0–8.0)

## 2020-12-30 MED ORDER — DOXYCYCLINE HYCLATE 100 MG PO CAPS: 100.0000 mg | ORAL_CAPSULE | Freq: Two times a day (BID) | ORAL | 0 refills | Status: AC

## 2020-12-30 NOTE — Progress Notes (Signed)
   Subjective:    Patient ID: Raymond Lutz, male    DOB: [birth year], 55 y.o.   MRN: [id number]  HPI Here for 2 days of the sudden onset of pain in the right testicle and groin. No urinary urgency or burning. Some low back and right groin pain. No fever or nausea. No recent trauma. Raymond Lutz says it feels like epididymitis, which Raymond Lutz has had several times before.    Review of Systems  Constitutional: Negative.   Respiratory: Negative.    Cardiovascular: Negative.   Gastrointestinal: Negative.   Genitourinary:  Positive for testicular pain. Negative for difficulty urinating, dysuria, flank pain, frequency, scrotal swelling and urgency.      Objective:   Physical Exam Constitutional:      Comments: In some pain   Cardiovascular:     Rate and Rhythm: Normal rate and regular rhythm.     Pulses: Normal pulses.     Heart sounds: Normal heart sounds.  Pulmonary:     Effort: Pulmonary effort is normal.     Breath sounds: Normal breath sounds.  Abdominal:     General: Abdomen is flat. Bowel sounds are normal. There is no distension.     Palpations: Abdomen is soft. There is no mass.     Tenderness: There is no abdominal tenderness. There is no guarding or rebound.     Hernia: No hernia is present.  Genitourinary:    Penis: Normal.      Comments: The right testicle is tender but not swollen. The left side is normal  Neurological:     Mental Status: Raymond Lutz is alert.          Assessment & Plan:  Epididymitis, treat with 10 days of Doxycycline. Recheck prn. Raymond Crane, MD

## 2020-12-30 NOTE — Addendum Note (Signed)
Addended by: Carola Rhine on: 12/30/2020 10:36 AM   Modules accepted: Orders

## 2021-01-06 ENCOUNTER — Other Ambulatory Visit: Payer: Self-pay | Admitting: Family Medicine

## 2021-01-06 DIAGNOSIS — J4520 Mild intermittent asthma, uncomplicated: Secondary | ICD-10-CM

## 2021-01-07 MED ORDER — ALBUTEROL SULFATE HFA 108 (90 BASE) MCG/ACT IN AERS: 2.0000 | INHALATION_SPRAY | RESPIRATORY_TRACT | 0 refills | Status: DC | PRN

## 2021-01-20 ENCOUNTER — Ambulatory Visit: Payer: BC Managed Care – PPO | Admitting: Family Medicine

## 2021-01-20 ENCOUNTER — Other Ambulatory Visit: Payer: Self-pay | Admitting: Family Medicine

## 2021-01-20 ENCOUNTER — Other Ambulatory Visit: Payer: Self-pay | Admitting: Cardiology

## 2021-01-20 VITALS — BP 128/80 | HR 70 | Temp 97.7°F | Wt 392.0 lb

## 2021-01-20 DIAGNOSIS — Z8249 Family history of ischemic heart disease and other diseases of the circulatory system: Secondary | ICD-10-CM

## 2021-01-20 DIAGNOSIS — E118 Type 2 diabetes mellitus with unspecified complications: Secondary | ICD-10-CM | POA: Diagnosis not present

## 2021-01-20 DIAGNOSIS — N529 Male erectile dysfunction, unspecified: Secondary | ICD-10-CM | POA: Diagnosis not present

## 2021-01-20 DIAGNOSIS — E162 Hypoglycemia, unspecified: Secondary | ICD-10-CM

## 2021-01-20 DIAGNOSIS — Z760 Encounter for issue of repeat prescription: Secondary | ICD-10-CM

## 2021-01-20 DIAGNOSIS — I10 Essential (primary) hypertension: Secondary | ICD-10-CM | POA: Diagnosis not present

## 2021-01-20 LAB — POCT GLYCOSYLATED HEMOGLOBIN (HGB A1C): Hemoglobin A1C: 5.9 % — AB (ref 4.0–5.6)

## 2021-01-20 NOTE — Progress Notes (Signed)
   Subjective:    Patient ID: Raymond Lutz, male    DOB: Jan 31, 1966, 55 y.o.   MRN: [id number]  HPI Here for several issues. First he has had 2 weeks of spells where he feels weak and shaky and lightheaded. These usually occur in the mornings. He has been watching his diet closely and he has lost some weight in the past few months. He has been taking Metformin XR 500 mg every morning as well as Glipizide 5 mg BID. His am fasting glucoses have been around 70 to 80. An A1c here today is 5.9%. Second he has noticed a decreased libido and has had trouble with erections for the pas year. He asks to have his testosterone level checked. Third he asks if he can be screened for aneurysms. He says both his mother and his brother have cerebral aneurysms, and he was advised to be checked for these himself. He denies any headaches or neurologic deficits.    Review of Systems  Constitutional: Negative.   Respiratory: Negative.    Cardiovascular: Negative.   Gastrointestinal: Negative.   Genitourinary: Negative.   Neurological:  Positive for weakness and light-headedness.      Objective:   Physical Exam Constitutional:      Appearance: He is obese. He is not ill-appearing.  Cardiovascular:     Rate and Rhythm: Normal rate and regular rhythm.     Pulses: Normal pulses.     Heart sounds: Normal heart sounds.  Pulmonary:     Effort: Pulmonary effort is normal.     Breath sounds: Normal breath sounds.  Neurological:     General: No focal deficit present.     Mental Status: He is alert and oriented to person, place, and time.          Assessment & Plan:  He has been having hypoglycemic spells, so we will stop the Glipizide completely. He will stay on Metformin XR. For the low libido and ED, we will check a testosterone level. For the family hx of cerebral aneurysms, we will get a BMET and will set up a contrasted head CT soon.  Gershon Crane, MD

## 2021-01-20 NOTE — Addendum Note (Signed)
Addended by: Gershon Crane A on: 01/20/2021 09:18 PM   Modules accepted: Orders

## 2021-01-21 ENCOUNTER — Encounter: Payer: Self-pay | Admitting: Family Medicine

## 2021-01-21 ENCOUNTER — Telehealth: Payer: Self-pay | Admitting: Family Medicine

## 2021-01-21 NOTE — Telephone Encounter (Signed)
FYI Pt called this morning state that he is not feeling well and still has dizzy spells, state that his blood sugar is still low 50 this AM, pt requests for  work excuse note which has been provided.

## 2021-01-21 NOTE — Telephone Encounter (Signed)
done

## 2021-01-21 NOTE — Telephone Encounter (Signed)
We already stopped the Glipizide, now tell him to stop the Metformin also (stop ALL diabetes medications). Let's see what the glucoses do over the weekend. Please get him the work notes he needs

## 2021-01-21 NOTE — Telephone Encounter (Signed)
Send pt message via MyChart 

## 2021-01-21 NOTE — Telephone Encounter (Signed)
Pt work note changed per pt request

## 2021-01-21 NOTE — Telephone Encounter (Signed)
Patient called to see if he could get a note excusing him from work on 11/11 as his blood sugar levels dropped to 50 last night and he woke up dizzy and nauseous this morning. Due to this he was unable to go work. Patient states note can be put on mychart and he will print it.     Good callback number is [phone]     Please advise

## 2021-01-21 NOTE — Telephone Encounter (Signed)
Already done

## 2021-01-21 NOTE — Telephone Encounter (Signed)
Work note sent to MyChart.  

## 2021-01-24 ENCOUNTER — Other Ambulatory Visit (INDEPENDENT_AMBULATORY_CARE_PROVIDER_SITE_OTHER): Payer: BC Managed Care – PPO

## 2021-01-24 DIAGNOSIS — N529 Male erectile dysfunction, unspecified: Secondary | ICD-10-CM

## 2021-01-24 DIAGNOSIS — E118 Type 2 diabetes mellitus with unspecified complications: Secondary | ICD-10-CM | POA: Diagnosis not present

## 2021-01-24 LAB — BASIC METABOLIC PANEL
BUN: 14 mg/dL (ref 6–23)
CO2: 26 mEq/L (ref 19–32)
Calcium: 9.2 mg/dL (ref 8.4–10.5)
Chloride: 104 mEq/L (ref 96–112)
Creatinine, Ser: 0.76 mg/dL (ref 0.40–1.50)
GFR: 102.27 mL/min (ref >60.00)
Glucose, Bld: 144 mg/dL — ABNORMAL HIGH (ref 70–99)
Potassium: 3.6 mEq/L (ref 3.5–5.1)
Sodium: 139 mEq/L (ref 135–145)

## 2021-01-24 LAB — TESTOSTERONE: Testosterone: 293.75 ng/dL — ABNORMAL LOW (ref 300.00–890.00)

## 2021-01-26 ENCOUNTER — Encounter: Payer: Self-pay | Admitting: Family Medicine

## 2021-01-27 DIAGNOSIS — L308 Other specified dermatitis: Secondary | ICD-10-CM | POA: Diagnosis not present

## 2021-01-27 DIAGNOSIS — I872 Venous insufficiency (chronic) (peripheral): Secondary | ICD-10-CM | POA: Diagnosis not present

## 2021-01-27 MED ORDER — TESTOSTERONE 20.25 MG/ACT (1.62%) TD GEL: 4.0000 "application " | Freq: Every day | TRANSDERMAL | 5 refills | Status: DC

## 2021-01-27 NOTE — Telephone Encounter (Signed)
I sent in a RX for testosterone gel to apply daily. It looks like this will require a PA but we can take care of that

## 2021-02-01 ENCOUNTER — Telehealth: Payer: Self-pay | Admitting: Family Medicine

## 2021-02-01 NOTE — Telephone Encounter (Signed)
See message.

## 2021-02-01 NOTE — Telephone Encounter (Signed)
Raymond Lutz from [employer] called nurse line about a prior authorization.  This is for 1.62% gel pump testosterone and it was denied.  He had untreated obstructive sleep apnea.  This drug is not advised to be used with that illness.  The prescriber has 180 days to appeal.

## 2021-02-02 ENCOUNTER — Telehealth: Payer: Self-pay

## 2021-02-02 NOTE — Telephone Encounter (Signed)
Rx required a PA which was submitted for appeal on 02/01/21, awaiting for plan outcome

## 2021-02-02 NOTE — Telephone Encounter (Signed)
Pt PA request for  his Testosterone Gel was submitted to pt plan. Will notify pt when PA is approved

## 2021-02-08 NOTE — Telephone Encounter (Signed)
Please submit an appeal. Use of testosterone products has nothing to do with sleep apnea

## 2021-02-10 ENCOUNTER — Ambulatory Visit: Payer: BC Managed Care – PPO | Admitting: Cardiology

## 2021-02-10 ENCOUNTER — Other Ambulatory Visit: Payer: Self-pay

## 2021-02-10 ENCOUNTER — Encounter: Payer: Self-pay | Admitting: Cardiology

## 2021-02-10 VITALS — BP 138/80 | HR 67 | Ht 73.0 in | Wt 393.4 lb

## 2021-02-10 DIAGNOSIS — E785 Hyperlipidemia, unspecified: Secondary | ICD-10-CM

## 2021-02-10 DIAGNOSIS — Z760 Encounter for issue of repeat prescription: Secondary | ICD-10-CM | POA: Diagnosis not present

## 2021-02-10 DIAGNOSIS — I10 Essential (primary) hypertension: Secondary | ICD-10-CM

## 2021-02-10 DIAGNOSIS — G4730 Sleep apnea, unspecified: Secondary | ICD-10-CM

## 2021-02-10 DIAGNOSIS — I2510 Atherosclerotic heart disease of native coronary artery without angina pectoris: Secondary | ICD-10-CM

## 2021-02-10 LAB — LIPID PANEL
Chol/HDL Ratio: 3.9 ratio (ref 0.0–5.0)
Cholesterol, Total: 141 mg/dL (ref 100–199)
HDL: 36 mg/dL — ABNORMAL LOW (ref >39)
LDL Chol Calc (NIH): 90 mg/dL (ref 0–99)
Triglycerides: 77 mg/dL (ref 0–149)
VLDL Cholesterol Cal: 15 mg/dL (ref 5–40)

## 2021-02-10 LAB — ALT: ALT: 25 IU/L (ref 0–44)

## 2021-02-10 MED ORDER — POTASSIUM CHLORIDE CRYS ER 20 MEQ PO TBCR: EXTENDED_RELEASE_TABLET | ORAL | 3 refills | Status: DC

## 2021-02-10 MED ORDER — EZETIMIBE 10 MG PO TABS: 10.0000 mg | ORAL_TABLET | Freq: Every day | ORAL | 3 refills | Status: DC

## 2021-02-10 MED ORDER — ATORVASTATIN CALCIUM 80 MG PO TABS: 80.0000 mg | ORAL_TABLET | Freq: Every day | ORAL | 3 refills | Status: DC

## 2021-02-10 MED ORDER — AMLODIPINE BESYLATE 10 MG PO TABS: 10.0000 mg | ORAL_TABLET | Freq: Every day | ORAL | 3 refills | Status: DC

## 2021-02-10 MED ORDER — TICAGRELOR 60 MG PO TABS: 60.0000 mg | ORAL_TABLET | Freq: Two times a day (BID) | ORAL | 3 refills | Status: DC

## 2021-02-10 MED ORDER — LOSARTAN POTASSIUM 100 MG PO TABS: 100.0000 mg | ORAL_TABLET | Freq: Every day | ORAL | 3 refills | Status: DC

## 2021-02-10 MED ORDER — FUROSEMIDE 40 MG PO TABS: 40.0000 mg | ORAL_TABLET | Freq: Two times a day (BID) | ORAL | 3 refills | Status: DC

## 2021-02-10 MED ORDER — ISOSORBIDE MONONITRATE ER 30 MG PO TB24: 30.0000 mg | ORAL_TABLET | Freq: Every day | ORAL | 3 refills | Status: DC

## 2021-02-10 MED ORDER — CARVEDILOL 12.5 MG PO TABS: 12.5000 mg | ORAL_TABLET | Freq: Two times a day (BID) | ORAL | 3 refills | Status: DC
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
## 2021-02-10 NOTE — Addendum Note (Signed)
Addended by: Vernard Gambles on: 02/10/2021 10:33 AM   Modules accepted: Orders

## 2021-02-10 NOTE — Patient Instructions (Signed)
Medication Instructions:  Decrease Brillinta to 60 mg twice a day  *If you need a refill on your cardiac medications before your next appointment, please call your pharmacy*   Lab Work: Lipids, Alt- Today   If you have labs (blood work) drawn today and your tests are completely normal, you will receive your results only by: MyChart Message (if you have MyChart) OR A paper copy in the mail If you have any lab test that is abnormal or we need to change your treatment, we will call you to review the results.   Testing/Procedures: Your physician has recommended that you have a sleep study. This test records several body functions during sleep, including: brain activity, eye movement, oxygen and carbon dioxide blood levels, heart rate and rhythm, breathing rate and rhythm, the flow of air through your mouth and nose, snoring, body muscle movements, and chest and belly movement.    Follow-Up: At [facility], you and your health needs are our priority.  As part of our continuing mission to provide you with exceptional heart care, we have created designated Provider Care Teams.  These Care Teams include your primary Cardiologist (physician) and Advanced Practice Providers (APPs -  Physician Assistants and Nurse Practitioners) who all work together to provide you with the care you need, when you need it.  We recommend signing up for the patient portal called "MyChart".  Sign up information is provided on this After Visit Summary.  MyChart is used to connect with patients for Virtual Visits (Telemedicine).  Patients are able to view lab/test results, encounter notes, upcoming appointments, etc.  Non-urgent messages can be sent to your provider as well.   To learn more about what you can do with MyChart, go to [email or web address].    Your next appointment:   12 month(s)  The format for your next appointment:   In Person  Provider:   Armanda Magic, MD {    Other Instructions None

## 2021-02-15 ENCOUNTER — Telehealth: Payer: Self-pay

## 2021-02-15 DIAGNOSIS — I2510 Atherosclerotic heart disease of native coronary artery without angina pectoris: Secondary | ICD-10-CM

## 2021-02-15 NOTE — Telephone Encounter (Signed)
-----   Message from Raymond Lutz, RPH-CPP sent at 02/15/2021  2:32 PM EST ----- LDL remains elevated at 90 on atorvastatin 80mg  daily and ezetimibe 10mg  daily. If he's adherent to both lipid meds, would have him scheduled with PharmD to discuss addition of PCSK9i therapy. His LDL goal is < 55 given premature ASCVD.

## 2021-02-15 NOTE — Telephone Encounter (Signed)
The patient has been notified of the result and verbalized understanding.  All questions (if any) were answered. Theresia Majors, RN 02/15/2021 5:19 PM  Patient has been adherent to both atorvastatin and zetia. Will refer him to lipid clinic.

## 2021-02-22 ENCOUNTER — Telehealth: Payer: Self-pay | Admitting: *Deleted

## 2021-02-22 ENCOUNTER — Telehealth: Payer: Self-pay

## 2021-02-22 NOTE — Telephone Encounter (Signed)
-----   Message from Vernard Gambles, [place] sent at 02/10/2021 11:06 AM EST ----- Regarding: Split night Pt needs split night per Dr. Mayford Knife   Dx: Sleep Apnea  Thanks

## 2021-02-22 NOTE — Telephone Encounter (Signed)
Spoke with pt Insurance regarding PA for Testosterone, completed a PA  on the phone and Rx was approved. Agent state that the approval is covered through 02/22/2022. Pt was notified

## 2021-02-22 NOTE — Telephone Encounter (Signed)
Pt Testosterone PA was approved, pt notified

## 2021-02-22 NOTE — Telephone Encounter (Signed)
Prior Authorization for SPLIT NIGHT sent to [facility] via Phone.  APPROVEDBerkley Harvey # [id number], VALID DATES  02-22-21--04-22-21

## 2021-02-23 NOTE — Telephone Encounter (Signed)
Pt PA was submitted to plan and  approved, pt was notified

## 2021-03-09 ENCOUNTER — Other Ambulatory Visit: Payer: Self-pay | Admitting: Family Medicine

## 2021-03-10 ENCOUNTER — Ambulatory Visit: Payer: BC Managed Care – PPO

## 2021-03-10 NOTE — Telephone Encounter (Signed)
Patient is scheduled for lab study on 04-06-20. Patient understands her sleep study will be done at [facility] sleep lab. Patient understands she will receive a sleep packet in a week or so. Patient understands to call if she does not receive the sleep packet in a timely manner. Patient agrees with treatment and thanked me for call.

## 2021-03-13 ENCOUNTER — Other Ambulatory Visit: Payer: Self-pay | Admitting: Family Medicine

## 2021-03-16 ENCOUNTER — Encounter (HOSPITAL_BASED_OUTPATIENT_CLINIC_OR_DEPARTMENT_OTHER): Payer: Self-pay | Admitting: Cardiology

## 2021-03-17 ENCOUNTER — Ambulatory Visit: Payer: BC Managed Care – PPO | Admitting: Family Medicine

## 2021-03-24 ENCOUNTER — Ambulatory Visit: Payer: BC Managed Care – PPO | Admitting: Family Medicine

## 2021-03-24 ENCOUNTER — Encounter: Payer: Self-pay | Admitting: Family Medicine

## 2021-03-24 VITALS — BP 130/80 | HR 64 | Temp 98.0°F | Wt 380.0 lb

## 2021-03-24 DIAGNOSIS — E291 Testicular hypofunction: Secondary | ICD-10-CM

## 2021-03-24 DIAGNOSIS — E118 Type 2 diabetes mellitus with unspecified complications: Secondary | ICD-10-CM

## 2021-03-24 DIAGNOSIS — F418 Other specified anxiety disorders: Secondary | ICD-10-CM

## 2021-03-24 DIAGNOSIS — I10 Essential (primary) hypertension: Secondary | ICD-10-CM | POA: Diagnosis not present

## 2021-03-24 DIAGNOSIS — I872 Venous insufficiency (chronic) (peripheral): Secondary | ICD-10-CM | POA: Diagnosis not present

## 2021-03-24 DIAGNOSIS — T753XXA Motion sickness, initial encounter: Secondary | ICD-10-CM

## 2021-03-24 MED ORDER — SCOPOLAMINE 1 MG/3DAYS TD PT72: 1.0000 | MEDICATED_PATCH | TRANSDERMAL | 2 refills | Status: DC

## 2021-03-24 MED ORDER — LORAZEPAM 0.5 MG PO TABS: 0.5000 mg | ORAL_TABLET | Freq: Four times a day (QID) | ORAL | 1 refills | Status: DC | PRN

## 2021-03-24 NOTE — Progress Notes (Signed)
° °  Subjective:    Patient ID: Raymond Lutz, male    DOB: 12/17/1965, 56 y.o.   MRN: [id number]  HPI Here for several issues. First he says his blood glucoses have been stable. His am fasting readings average 110-115. His last A1c on 11-10--22 was 5.9. at our last visit he was  having frequent hypoglycemic spells so we stopped his Glipizide. Since then he has not had any of these spells. It took awhile to get his testosterone gel approved by insurance so he has only been using this for 3 weeks. It it too soon to see any real benefits. Also he will be going on a cruise with his wife in 3 weeks, and he is quite anxious about this. He asks for something he could take for anxiety and for something to prevent sea sickness.    Review of Systems  Constitutional: Negative.   Respiratory: Negative.    Cardiovascular: Negative.   Psychiatric/Behavioral:  Negative for agitation and dysphoric mood. The patient is nervous/anxious.       Objective:   Physical Exam Constitutional:      Appearance: He is not ill-appearing.  Cardiovascular:     Rate and Rhythm: Normal rate and regular rhythm.     Pulses: Normal pulses.     Heart sounds: Normal heart sounds.  Pulmonary:     Effort: Pulmonary effort is normal.     Breath sounds: Normal breath sounds.  Neurological:     Mental Status: He is alert.          Assessment & Plan:  His HTN is stable. His diabetes is well controlled. We will check another A1c the next time he is in our clinic. We will check another testosterone level after he has been using the gel for a full 3 months. To prevent seasickness and anxiety on his cruise he will try Lorazepam and scopolamine patches. We spent a total of ( 30  ) minutes reviewing records and discussing these issues.  Gershon Crane, MD

## 2021-03-25 MED ORDER — LORAZEPAM 0.5 MG PO TABS: 0.5000 mg | ORAL_TABLET | Freq: Three times a day (TID) | ORAL | 1 refills | Status: DC | PRN

## 2021-03-25 NOTE — Addendum Note (Signed)
Addended by: Gershon Crane A on: 03/25/2021 05:08 PM   Modules accepted: Orders

## 2021-03-30 NOTE — Progress Notes (Signed)
Patient ID: Raymond Lutz                 DOB: 1965-12-23                    MRN: [id number]     HPI: Raymond Lutz is a 56 y.o. male patient referred to lipid clinic by Dr. Mayford Knife. PMH is significant for ASCAD s/p PCI of the distal LAD and OM 2 in 2017, morbid obesity, OSA not on CPAP, DM2, HTN, HLD, PVD, and PAF. In 02/2018, pt had a repeat heart cath for chest pain and slightly elevated troponin showing severe 90% focal in stent stenosis of the mid Lcx stent and patent stent in the LAD with mild-moderate LAD/RCA dz (25-30%) unchanged from prior cath. There was also residual 90% distal LAD too small for PCI. He underwent successful PCI to mid LCx ISR with 0% residual stenosis. Repeat cardiac cath in 11/13/2019 for NSTEMI showed 90% mid LAD stenosis with widely patent distal LAD OM2 and mid left circumflex stents. There was 30% mid to distal RCA and 20% proximal RCA stenosis and EF 55%.  At last visit on 02/10/21, LDL remained elevated at 90 mg/dL on atorvastatin 80mg  daily and ezetimibe 10mg  daily. Pt reported adherence to both lipid meds and was referred to PharmD to discuss addition of PCSK9i therapy.   Today, pt reports adherence to current regimen with atorvastatin 80mg  daily and Zetia 10mg  daily. When speaking about adding on another agent to his regimen, he said he wanted to be off as many medications as possible. He states he does not like injections or getting blood drawn, so is hesitant to start an injectable medication. He wants to do research on his options prior to starting a new agent. He endorses he has been focusing recently on a healthy diet and increasing his exercise in order to lose weight prior to an upcoming cruise in Feb. He has lost 20 lbs since seeing Dr. on 1/4 and is walking 3-4 days/wk at around ~11,000 steps/day.   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 2013: nonobstructive CAD with 50% OM, 20-30% dLAD, 30-40%dRCA  b. 12/2015: NSTEMI w/ 99% stenosis dLAD(DES placed), 99% 2nd Mrg (DES placed), apical LAD stenosis too small for PCI and mild disease in the RCA. c. 11/2019: NSTEMI showed 90% mid LAD with widely patent distal LAD OM2 and mid left circumflex stents, 30% mid to distal RCA and 20% proximal RCA  s/p PCI of the mid LAD.   DIABETES MELLITUS, TYPE II 10/17/2006   ED (erectile dysfunction)    GERD (gastroesophageal reflux disease)  History of gout    History of kidney stones    HYPERLIPIDEMIA 10/17/2006   HYPERTENSION 10/17/2006   OBESITY 10/17/2006   OSA on CPAP    not using CPAP regularly"need a new one" (02/21/2018)   Paroxysmal atrial fibrillation (HCC)    2006 - was on coumadin - took himself off 6 mos after initiation.   Shortness of breath     Current Outpatient Medications on File Prior to Visit  Medication Sig Dispense Refill   albuterol (VENTOLIN HFA) 108 (90 Base) MCG/ACT inhaler Inhale 2 puffs into the lungs every 4 (four) hours as needed. 18 g 0   amLODipine (NORVASC) 10 MG tablet Take 1 tablet (10 mg total) by mouth daily. 90 tablet 3   aspirin EC 81 MG tablet Take 1 tablet (81 mg total) by mouth daily. 90 tablet 3    atorvastatin (LIPITOR) 80 MG tablet Take 1 tablet (80 mg total) by mouth daily. 90 tablet 3   budesonide (PULMICORT) 1 MG/2ML nebulizer solution Take by nebulization.     carvedilol (COREG) 12.5 MG tablet Take 1 tablet (12.5 mg total) by mouth 2 (two) times daily with a meal. 360 tablet 3   cholecalciferol (VITAMIN D3) 25 MCG (1000 UNIT) tablet Take 1,000 Units by mouth daily.     dicyclomine (BENTYL) 20 MG tablet Take 1 tablet (20 mg total) by mouth 2 (two) times daily. (Patient not taking: Reported on 03/24/2021) 20 tablet 0   ezetimibe (ZETIA) 10 MG tablet Take 1 tablet (10 mg total) by mouth daily. 90 tablet 3   fluticasone (FLONASE) 50 MCG/ACT nasal spray Place 1 spray into both nostrils daily. 16 g 0   FREESTYLE LITE test strip USE 1 STRIP TO CHECK GLUCOSE ONCE DAILY 100 each 0   furosemide (LASIX) 40 MG tablet Take 1 tablet (40 mg total) by mouth 2 (two) times daily. 180 tablet 3   isosorbide mononitrate (IMDUR) 30 MG 24 hr tablet Take 1 tablet (30 mg total) by mouth daily. 90 tablet 3   LORazepam (ATIVAN) 0.5 MG tablet Take 1 tablet (0.5 mg total) by mouth every 8 (eight) hours as needed for anxiety. 60 tablet 1   losartan (COZAAR) 100 MG tablet Take 1 tablet (100 mg total) by mouth daily. 90 tablet 3   metFORMIN (GLUCOPHAGE-XR) 500 MG 24 hr tablet Take 1 tablet by mouth once daily with breakfast 90 tablet 0   nitroGLYCERIN (NITROSTAT) 0.4 MG SL tablet DISSOLVE ONE TABLET UNDER THE TONGUE EVERY 5 MINUTES AS NEEDED FOR CHEST PAIN.  DO NOT EXCEED A TOTAL OF 3 DOSES IN 15 MINUTES 25 tablet 2   ondansetron (ZOFRAN ODT) 4 MG disintegrating tablet Take 1 tablet (4 mg total) by mouth every 8 (eight) hours as needed for nausea or vomiting. 20 tablet 0   oxyCODONE (ROXICODONE) 5 MG immediate release tablet Take 1 tablet (5 mg total) by mouth every 4 (four) hours as needed for severe pain. 16 tablet 0   potassium chloride SA (KLOR-CON M) 20 MEQ tablet Take 2 tablets by mouth in the morning 180 tablet 3    scopolamine (TRANSDERM SCOP, 1.5 MG,) 1 MG/3DAYS Place 1 patch (1.5 mg total) onto the skin every 3 (three) days. 10 patch 2   terbinafine (LAMISIL) 250 MG tablet Take 1 tablet (250 mg total) by mouth daily. 90 tablet 1   Testosterone 20.25 MG/ACT (1.62%) GEL Place 4 application onto the skin daily. 75 g 5   ticagrelor (BRILINTA) 60 MG TABS  tablet Take 1 tablet (60 mg total) by mouth 2 (two) times daily. 180 tablet 3   No current facility-administered medications on file prior to visit.    No Known Allergies  Assessment/Plan:  1. Hyperlipidemia - LDL is elevated above goal of <55 due to premature ASCVD and NSTEMI. Discussed next options for the patient including PCSK9 inhibitors and Nexlizet. He wishes to research both medications first as he prefers to minimize the number of medications that he takes. For now, he will continue atorvastatin 80mg  daily and Zetia 10mg  daily. Encouraged patient to call if he decides to move forward with either option. Will plan to continue atorvastatin 80 mg daily and can change Zetia to either Nexlizet or PCSK9i to prevent pill burden from increasing as either combination should bring LDL to goal. Encouraged patient to continue working on a heart healthy diet and exercise.  , PharmD Student assisted in this visit  Megan E. Supple, PharmD, BCACP, CPP [facility] [address], [place], [address] [place] Phone: [phone]; Fax: [phone] 03/31/2021 12:51 PM

## 2021-03-30 NOTE — Progress Notes (Signed)
° °  Subjective:    Patient ID: Raymond Lutz, male    DOB: May 27, 1965, 56 y.o.   MRN: [id number]  HPI    Review of Systems     Objective:   Physical Exam        Assessment & Plan:

## 2021-03-31 ENCOUNTER — Ambulatory Visit: Payer: BC Managed Care – PPO | Admitting: Pharmacist

## 2021-03-31 ENCOUNTER — Other Ambulatory Visit: Payer: Self-pay

## 2021-03-31 DIAGNOSIS — E7800 Pure hypercholesterolemia, unspecified: Secondary | ICD-10-CM | POA: Diagnosis not present

## 2021-03-31 NOTE — Patient Instructions (Addendum)
Your LDL cholesterol is 90 and your goal is < 55  Continue taking your atorvastatin and ezetimibe each day.  The two types of medications we talked about today: 1) Repatha or Praluent (PCSK9 Inhibitors twice monthly injection) 2) Nexlizet (combination daily tablet of bempedoic acid + zetia)  Repatha is a subcutaneous injection given once every 2 weeks in the fatty tissue of your stomach or upper outer thigh. Repatha will lower your LDL cholesterol by 60% and helps to lower your chance of having a heart attack or stroke.  Either option would replace your Zetia and we would keep you on the atorvastatin 80mg  daily.  Call , PharmD if you would like to try either of these options [phone]

## 2021-04-06 ENCOUNTER — Ambulatory Visit (HOSPITAL_BASED_OUTPATIENT_CLINIC_OR_DEPARTMENT_OTHER): Payer: BC Managed Care – PPO | Attending: Cardiology | Admitting: Cardiology

## 2021-04-06 ENCOUNTER — Other Ambulatory Visit: Payer: Self-pay

## 2021-04-06 VITALS — Ht 73.0 in | Wt 373.0 lb

## 2021-04-06 DIAGNOSIS — G4733 Obstructive sleep apnea (adult) (pediatric): Secondary | ICD-10-CM | POA: Diagnosis not present

## 2021-04-06 DIAGNOSIS — G4730 Sleep apnea, unspecified: Secondary | ICD-10-CM

## 2021-04-10 NOTE — Procedures (Signed)
Patient Name: Raymond, Lutz Date: 04/06/2021 Gender: Male D.O.B: [birth year] Age (years): 26 Referring Provider: Fransico Him MD, ABSM Height (inches): 73 Interpreting Physician: Fransico Him MD, ABSM Weight (lbs): 373 RPSGT: Jorge Ny BMI: 49 MRN: [id number] Neck Size: 19.50  CLINICAL INFORMATION Sleep Study Type: Split Night CPAP  Indication for sleep study: Diabetes, Hypertension, Obesity, OSA, Re-Evaluation, Snoring  Epworth Sleepiness Score: 4  SLEEP STUDY TECHNIQUE As per the AASM Manual for the Scoring of Sleep and Associated Events v2.3 (April 2016) with a hypopnea requiring 4% desaturations.  The channels recorded and monitored were frontal, central and occipital EEG, electrooculogram (EOG), submentalis EMG (chin), nasal and oral airflow, thoracic and abdominal wall motion, anterior tibialis EMG, snore microphone, electrocardiogram, and pulse oximetry. Continuous positive airway pressure (CPAP) was initiated when the patient met split night criteria and was titrated according to treat sleep-disordered breathing.  MEDICATIONS Medications self-administered by patient taken the night of the study : N/A  RESPIRATORY PARAMETERS Diagnostic Total AHI (/hr): 56.0  RDI (/hr):67.2  OA Index (/hr): 0.9  CA Index (/hr): 0.0 REM AHI (/hr): N/A  NREM AHI (/hr):56.0  Supine AHI (/hr):50.1  Non-supine AHI (/hr):66.3 Min O2 Sat (%):88.0  Mean O2 (%): 94.4  Time below 88% (min):0.1   Titration Optimal Pressure (cm):8  AHI at Optimal Pressure (/hr):0  Min O2 at Optimal Pressure (%):93.0 Supine % at Optimal (%):46  Sleep % at Optimal (%):100   SLEEP ARCHITECTURE The recording time for the entire night was 443.2 minutes.  During a baseline period of 171.5 minutes, the patient slept for 128.6 minutes in REM and nonREM, yielding a sleep efficiency of 74.9%. Sleep onset after lights out was 7.0 minutes with a REM latency of N/A minutes. The patient spent 8.2%  of the night in stage N1 sleep, 91.8% in stage N2 sleep, 0.0% in stage N3 and 0% in REM.   During the titration period of 266.1 minutes, the patient slept for 265.8 minutes in REM and nonREM, yielding a sleep efficiency of 99.9%. Sleep onset after CPAP initiation was 0.0 minutes with a REM latency of 6.4 minutes. The patient spent 0.3% of the night in stage N1 sleep, 73.5% in stage N2 sleep, 0.0% in stage N3 and 26.1% in REM.  CARDIAC DATA The 2 lead EKG demonstrated sinus rhythm. The mean heart rate was 100.0 beats per minute. Other EKG findings include: None.  LEG MOVEMENT DATA The total Periodic Limb Movements of Sleep (PLMS) were 0. The PLMS index was 0.0 .  IMPRESSIONS - Severe obstructive sleep apnea occurred during the diagnostic portion of the study (AHI = 56.0/hour). An optimal PAP pressure was selected for this patient ( 8 cm of water) - Moderate oxygen desaturation was noted during the diagnostic portion of the study (Min O2 =88.0%). - The patient snored with soft snoring volume during the diagnostic portion of the study. - No cardiac abnormalities were noted during this study. - Clinically significant periodic limb movements did not occur during sleep.  DIAGNOSIS - Obstructive Sleep Apnea (G47.33)  RECOMMENDATIONS - Trial of CPAP therapy on 8 cm H2O with a Medium size Resmed Nasal Mask Airfit N20 mask and heated humidification. - Avoid alcohol, sedatives and other CNS depressants that may worsen sleep apnea and disrupt normal sleep architecture. - Sleep hygiene should be reviewed to assess factors that may improve sleep quality. - Weight management and regular exercise should be initiated or continued. - Return to Sleep Center for re-evaluation after 6 weeks of  therapy  [Electronically signed] 04/10/2021 06:31 PM  Fransico Him MD, ABSM Diplomate, American Board of Sleep Medicine

## 2021-04-12 ENCOUNTER — Encounter: Payer: Self-pay | Admitting: Family Medicine

## 2021-04-12 ENCOUNTER — Encounter: Payer: Self-pay | Admitting: Cardiology

## 2021-04-12 NOTE — Telephone Encounter (Signed)
We likely need to adjust medications. Make on OV to see me

## 2021-04-14 ENCOUNTER — Ambulatory Visit: Payer: BC Managed Care – PPO | Admitting: Family Medicine

## 2021-04-19 ENCOUNTER — Telehealth: Payer: Self-pay | Admitting: *Deleted

## 2021-04-19 NOTE — Telephone Encounter (Addendum)
The patient has been notified of the result.Left detailed message on voicemail and informed patient to call back.Latrelle Dodrill, CMA 04/19/2021 5:30 PM    Upon patient request DME selection is [facility] Patient understands he will be contacted by [facility] to set up his cpap. Patient understands to call if [facility] does not contact him with new setup in a timely manner. Patient understands they will be called once confirmation has been received from [facility]/ that they have received their new machine to schedule 10 week follow up appointment.   [facility] notified of new cpap order  Please add to airview Patient was grateful for the call and thanked me

## 2021-04-19 NOTE — Telephone Encounter (Signed)
-----   Message from Gaynelle Cage, CMA sent at 04/15/2021  9:31 AM EST -----  ----- Message ----- From: Quintella Reichert, MD Sent: 04/10/2021   6:36 PM EST To: Cv Div Sleep Studies  Please let patient know that they have sleep apnea with successful PAP titration.  PAP ordered placed in Epic.  Followup in 6 weeks after starting PAP therapy

## 2021-04-21 ENCOUNTER — Other Ambulatory Visit: Payer: Self-pay | Admitting: Family Medicine

## 2021-04-21 DIAGNOSIS — J4520 Mild intermittent asthma, uncomplicated: Secondary | ICD-10-CM

## 2021-04-22 MED ORDER — ALBUTEROL SULFATE HFA 108 (90 BASE) MCG/ACT IN AERS: 2.0000 | INHALATION_SPRAY | RESPIRATORY_TRACT | 0 refills | Status: DC | PRN

## 2021-05-12 ENCOUNTER — Ambulatory Visit: Payer: BC Managed Care – PPO | Admitting: Family Medicine

## 2021-05-12 ENCOUNTER — Other Ambulatory Visit: Payer: Self-pay

## 2021-05-12 ENCOUNTER — Encounter: Payer: Self-pay | Admitting: Family Medicine

## 2021-05-12 VITALS — BP 142/90 | HR 64 | Temp 97.9°F | Wt 373.0 lb

## 2021-05-12 DIAGNOSIS — E118 Type 2 diabetes mellitus with unspecified complications: Secondary | ICD-10-CM

## 2021-05-12 DIAGNOSIS — I10 Essential (primary) hypertension: Secondary | ICD-10-CM | POA: Diagnosis not present

## 2021-05-12 DIAGNOSIS — Z1211 Encounter for screening for malignant neoplasm of colon: Secondary | ICD-10-CM | POA: Diagnosis not present

## 2021-05-12 DIAGNOSIS — E291 Testicular hypofunction: Secondary | ICD-10-CM

## 2021-05-12 LAB — TESTOSTERONE: Testosterone: 482.93 ng/dL (ref 300.00–890.00)

## 2021-05-12 LAB — HEMOGLOBIN A1C: Hgb A1c MFr Bld: 7 % — ABNORMAL HIGH (ref 4.6–6.5)

## 2021-05-12 NOTE — Progress Notes (Signed)
? ?  Subjective:  ? ? Patient ID: Raymond Lutz, male    DOB: 30-Sep-1965, 56 y.o.   MRN: [id number] ? ?HPI ?Here to follow up. He has felt fine. He is working on diet and exercise., and he has lost 20 lbs since December. At that time we found his testosterone to be low at 293, and he has been using testosterone gel very day since then. His A1c in November was 5.9. his BP at home has been running ion the range of 125-135/80.  ? ? ?Review of Systems  ?Constitutional: Negative.   ?Respiratory: Negative.    ?Cardiovascular: Negative.   ? ?   ?Objective:  ? Physical Exam ?Constitutional:   ?   Appearance: He is obese.  ?Cardiovascular:  ?   Rate and Rhythm: Normal rate and regular rhythm.  ?   Pulses: Normal pulses.  ?   Heart sounds: Normal heart sounds.  ?Pulmonary:  ?   Effort: Pulmonary effort is normal.  ?   Breath sounds: Normal breath sounds.  ?Neurological:  ?   Mental Status: He is alert.  ? ? ? ? ? ?   ?Assessment & Plan:  ?For the diabetes, we will check an A1c today. For the hypogonadism, we will check a testosterone level today. His HTN is well controlled.  ?Gershon Crane, MD ? ? ?

## 2021-05-16 ENCOUNTER — Ambulatory Visit: Payer: BC Managed Care – PPO | Admitting: Family Medicine

## 2021-05-16 ENCOUNTER — Encounter: Payer: Self-pay | Admitting: Family Medicine

## 2021-05-16 ENCOUNTER — Telehealth: Payer: Self-pay

## 2021-05-16 VITALS — BP 156/88 | HR 65 | Temp 97.8°F | Wt 373.0 lb

## 2021-05-16 DIAGNOSIS — R1011 Right upper quadrant pain: Secondary | ICD-10-CM | POA: Diagnosis not present

## 2021-05-16 DIAGNOSIS — R109 Unspecified abdominal pain: Secondary | ICD-10-CM | POA: Diagnosis not present

## 2021-05-16 LAB — POC URINALSYSI DIPSTICK (AUTOMATED)
Bilirubin, UA: NEGATIVE
Glucose, UA: NEGATIVE
Ketones, UA: NEGATIVE
Leukocytes, UA: NEGATIVE
Nitrite, UA: NEGATIVE
Protein, UA: NEGATIVE
Spec Grav, UA: 1.02 (ref 1.010–1.025)
Urobilinogen, UA: 0.2 E.U./dL
pH, UA: 6 (ref 5.0–8.0)

## 2021-05-16 LAB — BASIC METABOLIC PANEL
BUN: 16 mg/dL (ref 6–23)
CO2: 29 mEq/L (ref 19–32)
Calcium: 9.5 mg/dL (ref 8.4–10.5)
Chloride: 102 mEq/L (ref 96–112)
Creatinine, Ser: 0.88 mg/dL (ref 0.40–1.50)
GFR: 97.63 mL/min (ref >60.00)
Glucose, Bld: 145 mg/dL — ABNORMAL HIGH (ref 70–99)
Potassium: 3.9 mEq/L (ref 3.5–5.1)
Sodium: 139 mEq/L (ref 135–145)

## 2021-05-16 LAB — AMYLASE: Amylase: 81 U/L (ref 27–131)

## 2021-05-16 LAB — CBC WITH DIFFERENTIAL/PLATELET
Basophils Absolute: 0.1 10*3/uL (ref 0.0–0.1)
Basophils Relative: 1.4 % (ref 0.0–3.0)
Eosinophils Absolute: 0.3 10*3/uL (ref 0.0–0.7)
Eosinophils Relative: 6.3 % — ABNORMAL HIGH (ref 0.0–5.0)
HCT: 39.4 % (ref 39.0–52.0)
Hemoglobin: 13.1 g/dL (ref 13.0–17.0)
Lymphocytes Relative: 34.4 % (ref 12.0–46.0)
Lymphs Abs: 1.6 10*3/uL (ref 0.7–4.0)
MCHC: 33.3 g/dL (ref 30.0–36.0)
MCV: 82.9 fl (ref 78.0–100.0)
Monocytes Absolute: 0.6 10*3/uL (ref 0.1–1.0)
Monocytes Relative: 12.2 % — ABNORMAL HIGH (ref 3.0–12.0)
Neutro Abs: 2.2 10*3/uL (ref 1.4–7.7)
Neutrophils Relative %: 45.7 % (ref 43.0–77.0)
Platelets: 270 10*3/uL (ref 150.0–400.0)
RBC: 4.75 Mil/uL (ref 4.22–5.81)
RDW: 14.8 % (ref 11.5–15.5)
WBC: 4.8 10*3/uL (ref 4.0–10.5)

## 2021-05-16 LAB — HEPATIC FUNCTION PANEL
ALT: 22 U/L (ref 0–53)
AST: 20 U/L (ref 0–37)
Albumin: 4.4 g/dL (ref 3.5–5.2)
Alkaline Phosphatase: 56 U/L (ref 39–117)
Bilirubin, Direct: 0.1 mg/dL (ref 0.0–0.3)
Total Bilirubin: 0.6 mg/dL (ref 0.2–1.2)
Total Protein: 7.3 g/dL (ref 6.0–8.3)

## 2021-05-16 LAB — LIPASE: Lipase: 43 U/L (ref 11.0–59.0)

## 2021-05-16 NOTE — Telephone Encounter (Signed)
Spoke with pt advised of Dr Clent Ridges recommendation/advise, pt verbalized understanding ?

## 2021-05-16 NOTE — Telephone Encounter (Signed)
Work note was sent to pt through MyChart per Dr Clent Ridges ?

## 2021-05-16 NOTE — Telephone Encounter (Signed)
We do not have time to talk to insurance companies. I have cancelled the CT scan and instead I have ordered an abdominal US. Hopefully this can be done tomorrow. Please tell the patient  ?

## 2021-05-16 NOTE — Telephone Encounter (Signed)
? ?[  3:57 PM] Leona Carry ?That Stat from this morning he put in for Alasdair Kleve is Pending,Will Dr. Clent Ridges do Peer to Peer?  ? ?[4:00 PM] Leona Carry ?case# is [phone]  # is  [phone]. For Quicker approval they recommend that he call  ? ? ?

## 2021-05-16 NOTE — Addendum Note (Signed)
Addended by: Gershon Crane A on: 05/16/2021 05:09 PM ? ? Modules accepted: Orders ? ?

## 2021-05-16 NOTE — Progress Notes (Signed)
? ?  Subjective:  ? ? Patient ID: Raymond Lutz, male    DOB: 05/21/1965, 56 y.o.   MRN: [id number] ? ?HPI ?Here for 3 days of sharp intermittent RUQ pains. No fever or nausea. Eating food does not seem to affect the pain. The pain does not move around or radiate. His BM have been normal. No urinary symptoms.  ? ? ?Review of Systems  ?Constitutional: Negative.   ?Respiratory: Negative.    ?Cardiovascular: Negative.   ?Gastrointestinal:  Positive for abdominal pain. Negative for abdominal distention, blood in stool, constipation, diarrhea, nausea, rectal pain and vomiting.  ?Genitourinary: Negative.   ? ?   ?Objective:  ? Physical Exam ?Constitutional:   ?   Comments: In mild pain   ?Cardiovascular:  ?   Rate and Rhythm: Normal rate and regular rhythm.  ?   Pulses: Normal pulses.  ?   Heart sounds: Normal heart sounds.  ?Pulmonary:  ?   Effort: Pulmonary effort is normal.  ?   Breath sounds: Normal breath sounds.  ?Abdominal:  ?   General: Abdomen is flat. Bowel sounds are normal. There is no distension.  ?   Palpations: Abdomen is soft. There is no mass.  ?   Tenderness: There is abdominal tenderness. There is rebound. There is no guarding.  ?   Hernia: No hernia is present.  ?   Comments: He is quite tender in the RUQ wit rebound tenderness, this is just inferior to the right lower rib margin   ?Neurological:  ?   Mental Status: He is alert.  ? ? ? ? ? ?   ?Assessment & Plan:  ?RUQ pain, likely due to gall bladder disease. We will get labs and set up a stat CT scan of abdomen and bladder. I advised him to be NPO for the time being.  ?Gershon Crane, MD ? ? ?

## 2021-05-16 NOTE — Telephone Encounter (Signed)
Please write him a work note for today and tomorrow saying we are treating him for a medical condition and tests results are still pending.  ?

## 2021-05-17 ENCOUNTER — Telehealth: Payer: Self-pay | Admitting: Family Medicine

## 2021-05-17 ENCOUNTER — Ambulatory Visit
Admission: RE | Admit: 2021-05-17 | Discharge: 2021-05-17 | Disposition: A | Discharge status: Home / Self Care | Payer: BC Managed Care – PPO | Source: Ambulatory Visit | Attending: Family Medicine | Admitting: Family Medicine

## 2021-05-17 ENCOUNTER — Encounter: Payer: Self-pay | Admitting: Family Medicine

## 2021-05-17 DIAGNOSIS — R1011 Right upper quadrant pain: Secondary | ICD-10-CM

## 2021-05-17 DIAGNOSIS — K7689 Other specified diseases of liver: Secondary | ICD-10-CM | POA: Diagnosis not present

## 2021-05-17 DIAGNOSIS — N281 Cyst of kidney, acquired: Secondary | ICD-10-CM | POA: Diagnosis not present

## 2021-05-17 NOTE — Telephone Encounter (Signed)
Pt is calling and would like nurse to go over his abd ultrasound results ?

## 2021-05-17 NOTE — Telephone Encounter (Signed)
See my Result Note for the [place] report. His pain may be from duodenitis, so we will call in the two acid blocking medications. Hopefully this will help his pain. Please write him a work excuse to be out the rest of this week. Plan on returning to work on Monday  ?

## 2021-05-18 ENCOUNTER — Encounter: Payer: Self-pay | Admitting: Family Medicine

## 2021-05-18 ENCOUNTER — Other Ambulatory Visit: Payer: Self-pay

## 2021-05-18 DIAGNOSIS — K5732 Diverticulitis of large intestine without perforation or abscess without bleeding: Secondary | ICD-10-CM

## 2021-05-18 LAB — URINE CULTURE
MICRO NUMBER:: 13091828
SPECIMEN QUALITY:: ADEQUATE

## 2021-05-18 MED ORDER — FAMOTIDINE 40 MG PO TABS: ORAL_TABLET | ORAL | 2 refills | Status: DC

## 2021-05-18 MED ORDER — OMEPRAZOLE 40 MG PO CPDR: DELAYED_RELEASE_CAPSULE | ORAL | 2 refills | Status: DC

## 2021-05-18 NOTE — Progress Notes (Signed)
Reviewed [place] results with pt verbalized understanding, pt Rx already sent to his pharmacy ?

## 2021-05-18 NOTE — Telephone Encounter (Signed)
Patient sent My Chart message about results.    Result message sent to patient . ?

## 2021-05-18 NOTE — Telephone Encounter (Signed)
Pt is calling regarding his ultrasound results ?

## 2021-05-18 NOTE — Telephone Encounter (Signed)
Give the new prescriptions 2 weeks so be effective. If he is not better in 2 weeks, see me again  ?

## 2021-05-19 ENCOUNTER — Ambulatory Visit: Payer: BC Managed Care – PPO | Admitting: Family Medicine

## 2021-05-19 ENCOUNTER — Encounter: Payer: Self-pay | Admitting: Family Medicine

## 2021-05-19 VITALS — BP 140/78 | HR 66 | Temp 97.9°F

## 2021-05-19 DIAGNOSIS — B029 Zoster without complications: Secondary | ICD-10-CM

## 2021-05-19 MED ORDER — VALACYCLOVIR HCL 1 G PO TABS
1000.0000 mg | ORAL_TABLET | Freq: Three times a day (TID) | ORAL | 0 refills | Status: DC
Start: 2021-05-19 — End: 2021-11-02

## 2021-05-19 MED ORDER — METHYLPREDNISOLONE ACETATE 40 MG/ML IJ SUSP
40.0000 mg | Freq: Once | INTRAMUSCULAR | Status: AC
  Administered 2021-05-19: 11:00:00 40 mg via INTRAMUSCULAR

## 2021-05-19 MED ORDER — METHYLPREDNISOLONE ACETATE 80 MG/ML IJ SUSP
80.0000 mg | Freq: Once | INTRAMUSCULAR | Status: AC
  Administered 2021-05-19: 11:00:00 80 mg via INTRAMUSCULAR

## 2021-05-19 MED ORDER — GABAPENTIN 100 MG PO CAPS: 100.0000 mg | ORAL_CAPSULE | Freq: Three times a day (TID) | ORAL | 0 refills | Status: DC

## 2021-05-19 NOTE — Telephone Encounter (Signed)
FYI

## 2021-05-19 NOTE — Telephone Encounter (Signed)
Pt is scheduled for appointment with Dr Clent Ridges this morning ?

## 2021-05-19 NOTE — Addendum Note (Signed)
Addended by: Carola Rhine on: 05/19/2021 11:17 AM ? ? Modules accepted: Orders ? ?

## 2021-05-19 NOTE — Progress Notes (Signed)
? ?  Subjective:  ? ? Patient ID: Raymond Lutz, male    DOB: [birth year], 56 y.o.   MRN: [id number] ? ?HPI ?Here to follow up on a burning pain in the right upper abdomen and flank, and now he has a rash that appeared last night in the same areas. The pain started about 9 days ago. At first we thought this may be a gall bladder issue, but an [place] was clear. He has been taking tylenol with no relief.  ? ? ?Review of Systems  ?Constitutional: Negative.   ?Respiratory: Negative.    ?Cardiovascular: Negative.   ?Gastrointestinal:  Positive for abdominal pain. Negative for constipation, diarrhea, nausea and vomiting.  ?Genitourinary:  Positive for flank pain.  ?Skin:  Positive for rash.  ? ?   ?Objective:  ? Physical Exam ?Constitutional:   ?   Comments: In pain   ?Cardiovascular:  ?   Rate and Rhythm: Normal rate and regular rhythm.  ?   Pulses: Normal pulses.  ?   Heart sounds: Normal heart sounds.  ?Pulmonary:  ?   Effort: Pulmonary effort is normal.  ?   Breath sounds: Normal breath sounds.  ?Abdominal:  ?   General: Abdomen is flat. Bowel sounds are normal. There is no distension.  ?   Palpations: Abdomen is soft. There is no mass.  ?   Tenderness: There is no right CVA tenderness, left CVA tenderness, guarding or rebound.  ?   Hernia: No hernia is present.  ?   Comments: Tender in the RUQ and right flank  ?Skin: ?   Comments: There are patches of erythema with vesicles on a band around the right side from the inferior rib line to the middle back   ?Neurological:  ?   Mental Status: He is alert.  ? ? ? ? ? ?   ?Assessment & Plan:  ?This shingles. He is given a shot of DepoMedrol. He will also start on 10 days of Valtrex. We will add Gabapentin TID for pain relief.  ?Gershon Crane, MD ? ? ?

## 2021-06-01 ENCOUNTER — Other Ambulatory Visit: Payer: Self-pay | Admitting: Family Medicine

## 2021-06-23 DIAGNOSIS — G4733 Obstructive sleep apnea (adult) (pediatric): Secondary | ICD-10-CM | POA: Diagnosis not present

## 2021-07-12 IMAGING — DX DG RIBS 2V*R*
4 series · 4 of 4 positions shown · non-contrast
Comparison: February 19, 2018.

CLINICAL DATA: Acute right rib pain without known injury.

EXAM:
RIGHT RIBS - 2 VIEW

[hemithorax (ribs) ap (1 of 2)]
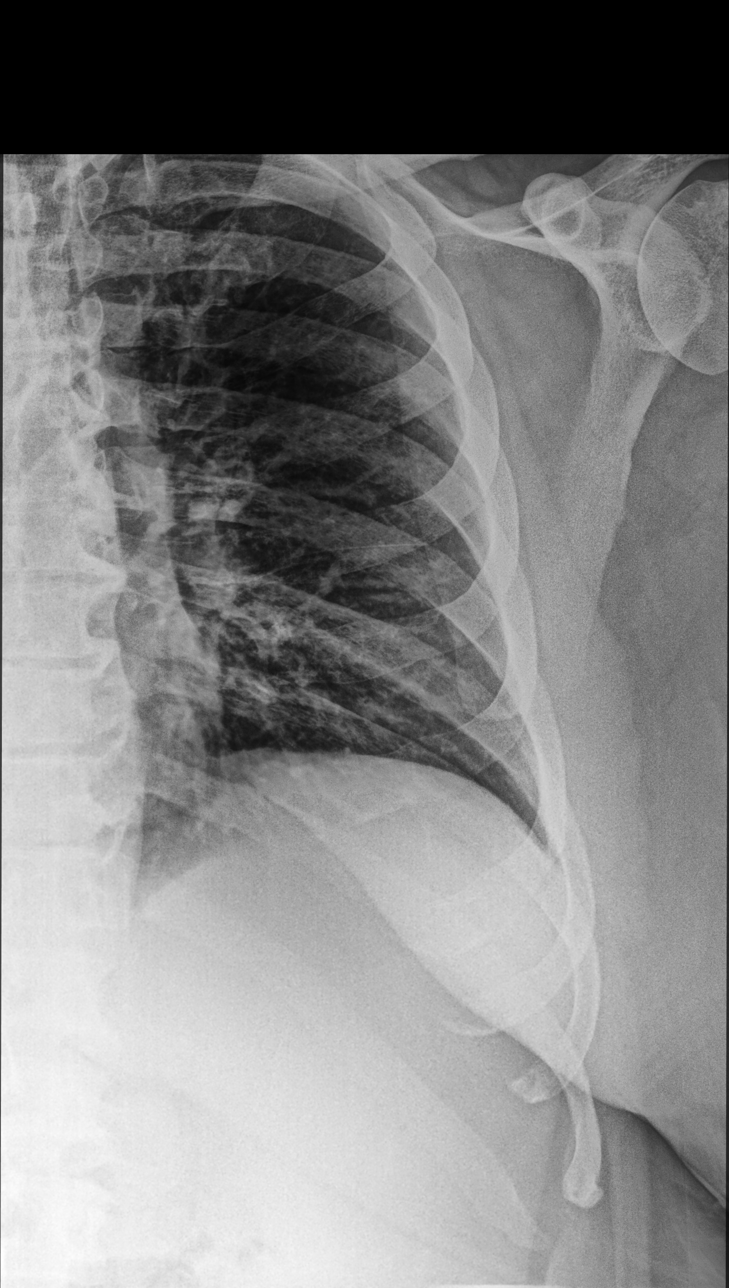

[hemithorax (ribs) mlo (1 of 2)]
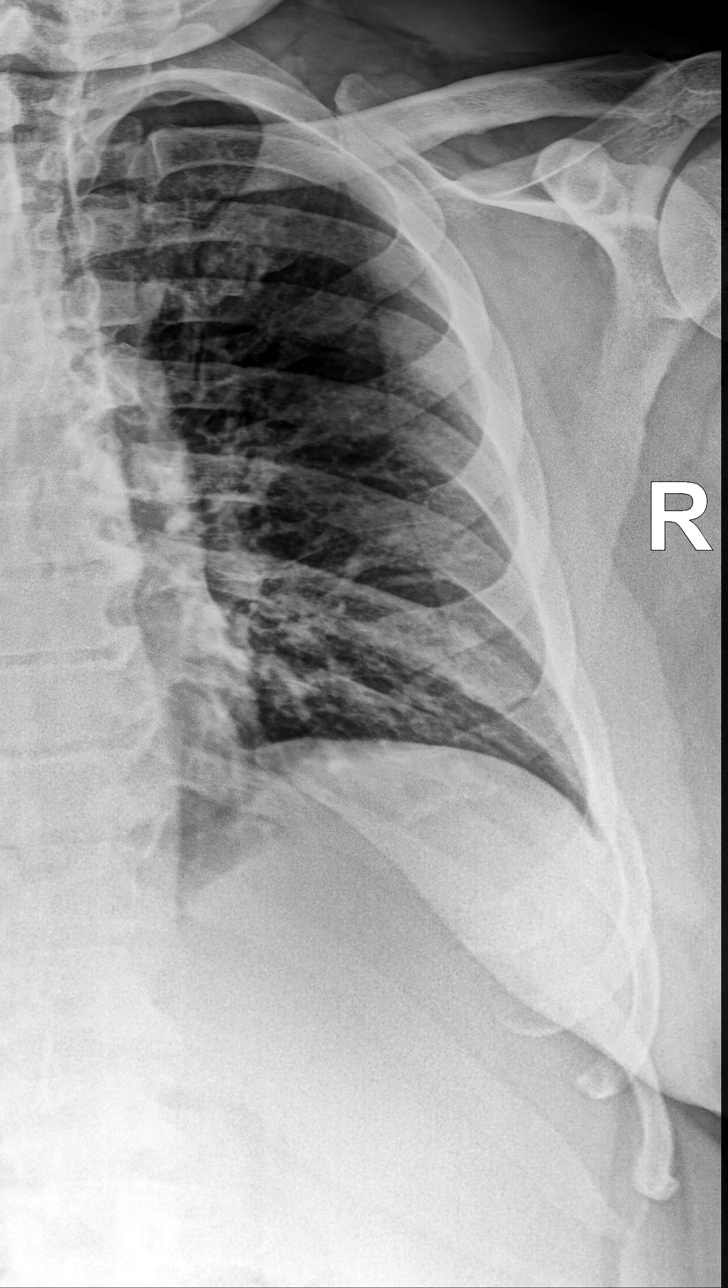

[hemithorax (ribs) ap (2 of 2)]
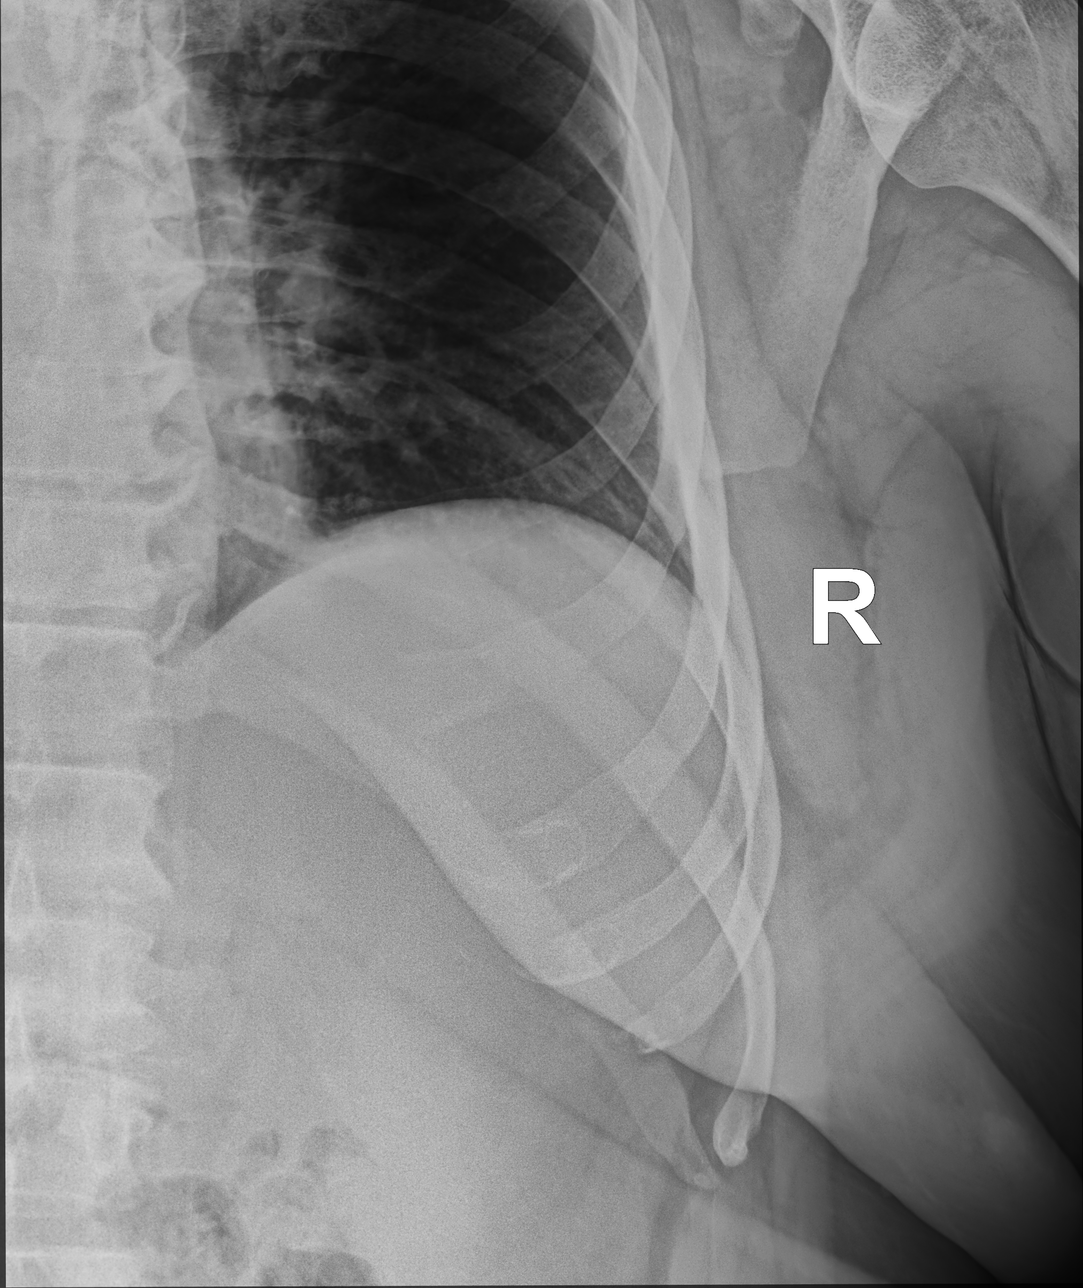

[hemithorax (ribs) mlo (2 of 2)]
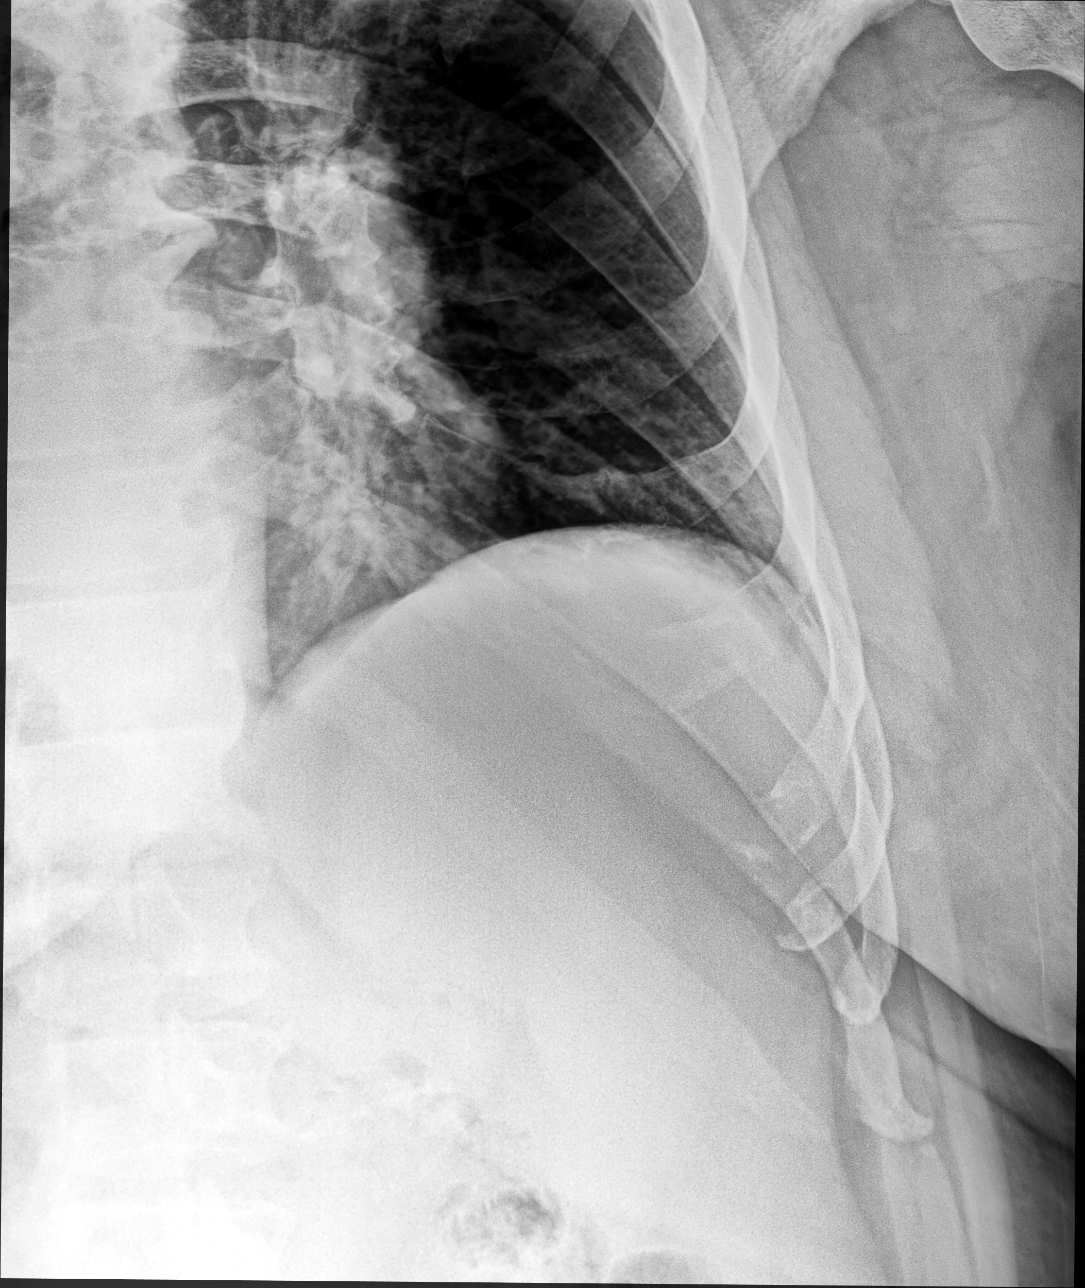

[4 of 4 positions shown; findings below may reference images not displayed]

FINDINGS: No fracture or other bone lesions are seen involving the ribs.
IMPRESSION: Negative.

## 2021-07-23 DIAGNOSIS — G4733 Obstructive sleep apnea (adult) (pediatric): Secondary | ICD-10-CM | POA: Diagnosis not present

## 2021-08-23 DIAGNOSIS — G4733 Obstructive sleep apnea (adult) (pediatric): Secondary | ICD-10-CM | POA: Diagnosis not present

## 2021-08-24 ENCOUNTER — Other Ambulatory Visit: Payer: Self-pay | Admitting: Family Medicine

## 2021-09-22 DIAGNOSIS — G4733 Obstructive sleep apnea (adult) (pediatric): Secondary | ICD-10-CM | POA: Diagnosis not present

## 2021-10-04 ENCOUNTER — Encounter: Payer: Self-pay | Admitting: Family Medicine

## 2021-10-04 ENCOUNTER — Ambulatory Visit: Payer: BC Managed Care – PPO | Admitting: Family Medicine

## 2021-10-04 VITALS — BP 142/86 | HR 72 | Temp 98.0°F | Wt 381.0 lb

## 2021-10-04 DIAGNOSIS — I10 Essential (primary) hypertension: Secondary | ICD-10-CM

## 2021-10-04 NOTE — Progress Notes (Signed)
   Subjective:    Patient ID: Raymond Lutz, male    DOB: April 02, 1965, 56 y.o.   MRN: [id number]  HPI Here to follow up on HTN. He was working outside in the heat yesterday, and he felt lightheaded and a bit weak. No headache or chest pain or SOB. He checked his BP and it was up to 150/90. He went inside to cool off and rested, and the BP came down to 140/88. Today he feels back to normal. He admits to gaining about 8 lbs in the past 6 months. No ankle edema.    Review of Systems  Constitutional: Negative.   Respiratory: Negative.    Cardiovascular: Negative.   Neurological:  Positive for light-headedness. Negative for dizziness.       Objective:   Physical Exam Constitutional:      Appearance: He is obese. He is not ill-appearing.  Cardiovascular:     Rate and Rhythm: Normal rate and regular rhythm.     Pulses: Normal pulses.     Heart sounds: Normal heart sounds.  Pulmonary:     Effort: Pulmonary effort is normal.     Breath sounds: Normal breath sounds.  Musculoskeletal:     Right lower leg: No edema.     Left lower leg: No edema.  Neurological:     General: No focal deficit present.     Mental Status: He is alert. Mental status is at baseline.           Assessment & Plan:  His HTN has been under borderline control lately, and his weight gain is the most likely culprit. He agreed to get serious with his diet and to reduce his sodium intake. We will see him back in about 2 weeks for a well exam with fasting labs.  Gershon Crane, MD

## 2021-10-20 ENCOUNTER — Other Ambulatory Visit: Payer: Self-pay | Admitting: Family Medicine

## 2021-10-23 DIAGNOSIS — G4733 Obstructive sleep apnea (adult) (pediatric): Secondary | ICD-10-CM | POA: Diagnosis not present

## 2021-10-28 ENCOUNTER — Encounter: Payer: BC Managed Care – PPO | Admitting: Family Medicine

## 2021-11-01 ENCOUNTER — Telehealth: Payer: Self-pay | Admitting: Cardiology

## 2021-11-01 NOTE — Telephone Encounter (Signed)
Patient requesting a note for work stating he has to wait until tomorrow to come in to be seen.

## 2021-11-01 NOTE — Telephone Encounter (Signed)
Spoke with the patient who states that since last evening his heart rate has been jumping up and down. He reports that typically his heart rate resting is in the upper 50s. Since last night resting reate has been in the mid 80s. Heart rate has jumped up to as high as 120 at times. He can feel his heart racing and he does get a but short winded. He denies chest pain but does feel some tightness across his chest when heart rate becomes elevated. He has not had symptoms like this is in the past. He has not taken any nitroglycerin. Patient educated on nitroglycerin use and ER precautions. He has an appointment to see Dr. Mayford Knife 8/23 at 8:30am.

## 2021-11-01 NOTE — Telephone Encounter (Signed)
STAT if HR is under 50 or over 120 (normal HR is 60-100 beats per minute)  What is your heart rate? 75  Do you have a log of your heart rate readings (document readings)? Yes - Fitbit  Do you have any other symptoms?   Patient stated that his heart rate is acting like he is exercising but he is not. Patient has appointment scheduled on 11/03/21.

## 2021-11-01 NOTE — Telephone Encounter (Signed)
Spoke with the patient, he would prefer to see Dr. Mayford Knife rather than nurse visit.  Advised we can give him a work note tomorrow morning.

## 2021-11-02 ENCOUNTER — Ambulatory Visit: Payer: BC Managed Care – PPO | Admitting: Cardiology

## 2021-11-02 ENCOUNTER — Encounter: Payer: Self-pay | Admitting: Cardiology

## 2021-11-02 VITALS — BP 124/70 | HR 92 | Ht 73.0 in | Wt 382.0 lb

## 2021-11-02 DIAGNOSIS — G4733 Obstructive sleep apnea (adult) (pediatric): Secondary | ICD-10-CM

## 2021-11-02 DIAGNOSIS — E785 Hyperlipidemia, unspecified: Secondary | ICD-10-CM

## 2021-11-02 DIAGNOSIS — R600 Localized edema: Secondary | ICD-10-CM | POA: Diagnosis not present

## 2021-11-02 DIAGNOSIS — I480 Paroxysmal atrial fibrillation: Secondary | ICD-10-CM

## 2021-11-02 DIAGNOSIS — I2510 Atherosclerotic heart disease of native coronary artery without angina pectoris: Secondary | ICD-10-CM

## 2021-11-02 DIAGNOSIS — I10 Essential (primary) hypertension: Secondary | ICD-10-CM | POA: Diagnosis not present

## 2021-11-02 LAB — COMPREHENSIVE METABOLIC PANEL
ALT: 21 IU/L (ref 0–44)
AST: 19 IU/L (ref 0–40)
Albumin/Globulin Ratio: 1.8 (ref 1.2–2.2)
Albumin: 4.7 g/dL (ref 3.8–4.9)
Alkaline Phosphatase: 74 IU/L (ref 44–121)
BUN/Creatinine Ratio: 13 (ref 9–20)
BUN: 13 mg/dL (ref 6–24)
Bilirubin Total: 0.6 mg/dL (ref 0.0–1.2)
CO2: 23 mmol/L (ref 20–29)
Calcium: 10 mg/dL (ref 8.7–10.2)
Chloride: 102 mmol/L (ref 96–106)
Creatinine, Ser: 1.01 mg/dL (ref 0.76–1.27)
Globulin, Total: 2.6 g/dL (ref 1.5–4.5)
Glucose: 160 mg/dL — ABNORMAL HIGH (ref 70–99)
Potassium: 4.3 mmol/L (ref 3.5–5.2)
Sodium: 142 mmol/L (ref 134–144)
Total Protein: 7.3 g/dL (ref 6.0–8.5)
eGFR: 88 mL/min/{1.73_m2} (ref >59)

## 2021-11-02 LAB — LIPID PANEL
Chol/HDL Ratio: 4.1 ratio (ref 0.0–5.0)
Cholesterol, Total: 138 mg/dL (ref 100–199)
HDL: 34 mg/dL — ABNORMAL LOW (ref >39)
LDL Chol Calc (NIH): 85 mg/dL (ref 0–99)
Triglycerides: 104 mg/dL (ref 0–149)
VLDL Cholesterol Cal: 19 mg/dL (ref 5–40)

## 2021-11-02 LAB — MAGNESIUM: Magnesium: 1.9 mg/dL (ref 1.6–2.3)

## 2021-11-02 LAB — TSH: TSH: 1.92 u[IU]/mL (ref 0.450–4.500)

## 2021-11-02 MED ORDER — APIXABAN 5 MG PO TABS: 5.0000 mg | ORAL_TABLET | Freq: Two times a day (BID) | ORAL | 3 refills | Status: DC

## 2021-11-02 MED ORDER — METOPROLOL TARTRATE 50 MG PO TABS: 50.0000 mg | ORAL_TABLET | Freq: Two times a day (BID) | ORAL | 3 refills | Status: DC
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
## 2021-11-02 NOTE — Addendum Note (Signed)
Addended by: Theresia Majors on: 11/02/2021 09:26 AM   Modules accepted: Orders

## 2021-11-02 NOTE — Patient Instructions (Signed)
Medication Instructions:  Your physician has recommended you make the following change in your medication:  1) STOP taking Brilinta 2) START taking Eliquis 5 mg twice daily  3) STOP taking Coreg (carvedilol) 4) START taking Lopressor (metoprolol tartrate) 50 mg twice daily   *If you need a refill on your cardiac medications before your next appointment, please call your pharmacy*  Lab Work: TODAY: CMET, FLP, TSH, Mg  If you have labs (blood work) drawn today and your tests are completely normal, you will receive your results only by: MyChart Message (if you have MyChart) OR A paper copy in the mail If you have any lab test that is abnormal or we need to change your treatment, we will call you to review the results.   Testing/Procedures: Your physician has requested that you have an echocardiogram. Echocardiography is a painless test that uses sound waves to create images of your heart. It provides your doctor with information about the size and shape of your heart and how well your heart's chambers and valves are working. This procedure takes approximately one hour. There are no restrictions for this procedure.  Follow-Up: At [facility], you and your health needs are our priority.  As part of our continuing mission to provide you with exceptional heart care, we have created designated Provider Care Teams.  These Care Teams include your primary Cardiologist (physician) and Advanced Practice Providers (APPs -  Physician Assistants and Nurse Practitioners) who all work together to provide you with the care you need, when you need it.  You have been referred to the Atrial Fibrillation Clinic  AFIB CLINIC INFORMATION: Your appointment is scheduled on:     at     . Please arrive 15 minutes early for check-in. The AFib Clinic is located in the Heart and Vascular Specialty Clinics at [facility]. Parking instructions/directions: [employer] social research [address] ([address]). When you pull in to  Entrance C, there is an underground parking garage to your right. The code to enter the garage is 1403 for August and 1502 for September. Take the elevators to the first floor. Follow the signs to the Heart and Vascular Specialty Clinics. You will see registration at the end of the hallway.  Phone number: [phone]  Your next appointment:   October 11th, at 1:30pm  The format for your next appointment:   In Person  Provider:   Armanda Magic, MD    Important Information About Sugar

## 2021-11-03 ENCOUNTER — Ambulatory Visit: Payer: BC Managed Care – PPO | Admitting: Cardiology

## 2021-11-08 ENCOUNTER — Other Ambulatory Visit: Payer: Self-pay

## 2021-11-08 DIAGNOSIS — E785 Hyperlipidemia, unspecified: Secondary | ICD-10-CM

## 2021-11-10 DIAGNOSIS — M25562 Pain in left knee: Secondary | ICD-10-CM | POA: Diagnosis not present

## 2021-11-10 DIAGNOSIS — M25561 Pain in right knee: Secondary | ICD-10-CM | POA: Diagnosis not present

## 2021-11-17 ENCOUNTER — Ambulatory Visit (HOSPITAL_COMMUNITY): Payer: BC Managed Care – PPO | Attending: Cardiology

## 2021-11-17 DIAGNOSIS — R600 Localized edema: Secondary | ICD-10-CM | POA: Insufficient documentation

## 2021-11-17 DIAGNOSIS — I10 Essential (primary) hypertension: Secondary | ICD-10-CM | POA: Insufficient documentation

## 2021-11-17 DIAGNOSIS — I480 Paroxysmal atrial fibrillation: Secondary | ICD-10-CM | POA: Diagnosis not present

## 2021-11-17 DIAGNOSIS — G4733 Obstructive sleep apnea (adult) (pediatric): Secondary | ICD-10-CM | POA: Insufficient documentation

## 2021-11-17 DIAGNOSIS — I2510 Atherosclerotic heart disease of native coronary artery without angina pectoris: Secondary | ICD-10-CM | POA: Insufficient documentation

## 2021-11-17 DIAGNOSIS — I4891 Unspecified atrial fibrillation: Secondary | ICD-10-CM

## 2021-11-17 DIAGNOSIS — E785 Hyperlipidemia, unspecified: Secondary | ICD-10-CM | POA: Insufficient documentation

## 2021-11-17 LAB — ECHOCARDIOGRAM COMPLETE: S' Lateral: 3.7 cm

## 2021-11-23 DIAGNOSIS — G4733 Obstructive sleep apnea (adult) (pediatric): Secondary | ICD-10-CM | POA: Diagnosis not present

## 2021-11-24 ENCOUNTER — Encounter (HOSPITAL_COMMUNITY): Payer: Self-pay | Admitting: Nurse Practitioner

## 2021-11-24 ENCOUNTER — Ambulatory Visit (HOSPITAL_COMMUNITY)
Admission: RE | Admit: 2021-11-24 | Discharge: 2021-11-24 | Disposition: A | Discharge status: Home / Self Care | Payer: BC Managed Care – PPO | Source: Ambulatory Visit | Attending: Physician Assistant | Admitting: Physician Assistant

## 2021-11-24 ENCOUNTER — Encounter: Payer: BC Managed Care – PPO | Admitting: Family Medicine

## 2021-11-24 VITALS — BP 152/66 | HR 63 | Ht 73.0 in | Wt 387.6 lb

## 2021-11-24 DIAGNOSIS — I10 Essential (primary) hypertension: Secondary | ICD-10-CM | POA: Diagnosis not present

## 2021-11-24 DIAGNOSIS — I480 Paroxysmal atrial fibrillation: Secondary | ICD-10-CM | POA: Insufficient documentation

## 2021-11-24 DIAGNOSIS — E785 Hyperlipidemia, unspecified: Secondary | ICD-10-CM | POA: Insufficient documentation

## 2021-11-24 DIAGNOSIS — D6869 Other thrombophilia: Secondary | ICD-10-CM | POA: Diagnosis not present

## 2021-11-24 DIAGNOSIS — E119 Type 2 diabetes mellitus without complications: Secondary | ICD-10-CM | POA: Insufficient documentation

## 2021-11-24 DIAGNOSIS — G4733 Obstructive sleep apnea (adult) (pediatric): Secondary | ICD-10-CM | POA: Diagnosis not present

## 2021-11-24 DIAGNOSIS — I739 Peripheral vascular disease, unspecified: Secondary | ICD-10-CM | POA: Diagnosis not present

## 2021-11-24 DIAGNOSIS — I4891 Unspecified atrial fibrillation: Secondary | ICD-10-CM

## 2021-11-24 LAB — CBC
HCT: 41.6 % (ref 39.0–52.0)
Hemoglobin: 13.1 g/dL (ref 13.0–17.0)
MCH: 27 pg (ref 26.0–34.0)
MCHC: 31.5 g/dL (ref 30.0–36.0)
MCV: 85.8 fL (ref 80.0–100.0)
Platelets: 291 10*3/uL (ref 150–400)
RBC: 4.85 MIL/uL (ref 4.22–5.81)
RDW: 15.1 % (ref 11.5–15.5)
WBC: 5.1 10*3/uL (ref 4.0–10.5)
nRBC: 0 % (ref 0.0–0.2)

## 2021-11-24 LAB — BASIC METABOLIC PANEL
Anion gap: 8 (ref 5–15)
BUN: 13 mg/dL (ref 6–20)
CO2: 27 mmol/L (ref 22–32)
Calcium: 9.3 mg/dL (ref 8.9–10.3)
Chloride: 106 mmol/L (ref 98–111)
Creatinine, Ser: 0.84 mg/dL (ref 0.61–1.24)
GFR, Estimated: >60 mL/min (ref >60)
Glucose, Bld: 123 mg/dL — ABNORMAL HIGH (ref 70–99)
Potassium: 3.8 mmol/L (ref 3.5–5.1)
Sodium: 141 mmol/L (ref 135–145)
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
## 2021-11-24 NOTE — Patient Instructions (Signed)
Cardioversion scheduled for Thursday, September 28th  - Arrive at the [facility] and go to admitting at [facility] not eat or drink anything after midnight the night prior to your procedure.  - Take all your morning medication (except diabetic medications) with a sip of water prior to arrival.  - You will not be able to drive home after your procedure.  - Do NOT miss any doses of your blood thinner - if you should miss a dose please notify our office immediately.  - If you feel as if you go back into normal rhythm prior to scheduled cardioversion, please notify our office immediately. If your procedure is canceled in the cardioversion suite you will be charged a cancellation fee.
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
## 2021-12-01 ENCOUNTER — Ambulatory Visit (HOSPITAL_COMMUNITY): Payer: BC Managed Care – PPO | Admitting: Physician Assistant

## 2021-12-05 ENCOUNTER — Other Ambulatory Visit: Payer: Self-pay

## 2021-12-05 MED ORDER — NITROGLYCERIN 0.4 MG SL SUBL: SUBLINGUAL_TABLET | SUBLINGUAL | 10 refills | Status: DC

## 2021-12-08 ENCOUNTER — Other Ambulatory Visit: Payer: Self-pay

## 2021-12-08 ENCOUNTER — Ambulatory Visit (HOSPITAL_COMMUNITY)
Admission: RE | Admit: 2021-12-08 | Discharge: 2021-12-08 | Disposition: A | Discharge status: Home / Self Care | Payer: BC Managed Care – PPO | Attending: Cardiology | Admitting: Cardiology

## 2021-12-08 ENCOUNTER — Ambulatory Visit (HOSPITAL_COMMUNITY): Payer: BC Managed Care – PPO | Admitting: Anesthesiology

## 2021-12-08 ENCOUNTER — Encounter (HOSPITAL_COMMUNITY)
Admission: RE | Disposition: A | Discharge status: Home / Self Care | Payer: Self-pay | Source: Home / Self Care | Attending: Cardiology

## 2021-12-08 ENCOUNTER — Encounter (HOSPITAL_COMMUNITY): Payer: Self-pay | Admitting: Cardiology

## 2021-12-08 DIAGNOSIS — Z955 Presence of coronary angioplasty implant and graft: Secondary | ICD-10-CM | POA: Insufficient documentation

## 2021-12-08 DIAGNOSIS — I2510 Atherosclerotic heart disease of native coronary artery without angina pectoris: Secondary | ICD-10-CM | POA: Insufficient documentation

## 2021-12-08 DIAGNOSIS — Z7901 Long term (current) use of anticoagulants: Secondary | ICD-10-CM | POA: Insufficient documentation

## 2021-12-08 DIAGNOSIS — I252 Old myocardial infarction: Secondary | ICD-10-CM | POA: Insufficient documentation

## 2021-12-08 DIAGNOSIS — E785 Hyperlipidemia, unspecified: Secondary | ICD-10-CM | POA: Diagnosis not present

## 2021-12-08 DIAGNOSIS — I4891 Unspecified atrial fibrillation: Secondary | ICD-10-CM

## 2021-12-08 DIAGNOSIS — G4733 Obstructive sleep apnea (adult) (pediatric): Secondary | ICD-10-CM | POA: Insufficient documentation

## 2021-12-08 DIAGNOSIS — I10 Essential (primary) hypertension: Secondary | ICD-10-CM | POA: Insufficient documentation

## 2021-12-08 DIAGNOSIS — M1990 Unspecified osteoarthritis, unspecified site: Secondary | ICD-10-CM | POA: Insufficient documentation

## 2021-12-08 DIAGNOSIS — I4819 Other persistent atrial fibrillation: Secondary | ICD-10-CM | POA: Diagnosis not present

## 2021-12-08 DIAGNOSIS — E1151 Type 2 diabetes mellitus with diabetic peripheral angiopathy without gangrene: Secondary | ICD-10-CM | POA: Diagnosis not present

## 2021-12-08 DIAGNOSIS — J45909 Unspecified asthma, uncomplicated: Secondary | ICD-10-CM | POA: Diagnosis not present

## 2021-12-08 DIAGNOSIS — I4430 Unspecified atrioventricular block: Secondary | ICD-10-CM

## 2021-12-08 DIAGNOSIS — Z6841 Body Mass Index (BMI) 40.0 and over, adult: Secondary | ICD-10-CM | POA: Diagnosis not present

## 2021-12-08 HISTORY — PX: CARDIOVERSION: SHX1299

## 2021-12-08 LAB — GLUCOSE, CAPILLARY: Glucose-Capillary: 138 mg/dL — ABNORMAL HIGH (ref 70–99)

## 2021-12-08 SURGERY — CARDIOVERSION
Anesthesia: General

## 2021-12-08 MED ORDER — PROPOFOL 10 MG/ML IV BOLUS
INTRAVENOUS | Status: DC | PRN
  Administered 2021-12-08: 110 mg via INTRAVENOUS
  Administered 2021-12-08: 20 mg via INTRAVENOUS

## 2021-12-08 MED ORDER — SODIUM CHLORIDE 0.9 % IV SOLN: INTRAVENOUS | Status: DC

## 2021-12-08 MED ORDER — LIDOCAINE 2% (20 MG/ML) 5 ML SYRINGE
INTRAMUSCULAR | Status: DC | PRN
  Administered 2021-12-08: 60 mg via INTRAVENOUS

## 2021-12-08 NOTE — Anesthesia Preprocedure Evaluation (Addendum)
Anesthesia Evaluation  Patient identified by MRN, date of birth, ID band Patient awake    Reviewed: Allergy & Precautions, H&P , NPO status , Patient's Chart, lab work & pertinent test results, reviewed documented beta blocker date and time   Airway Mallampati: III  TM Distance: >3 FB Neck ROM: Full    Dental no notable dental hx. (+) Teeth Intact, Dental Advisory Given   Pulmonary asthma , sleep apnea ,    Pulmonary exam normal breath sounds clear to auscultation       Cardiovascular hypertension, Pt. on medications and Pt. on home beta blockers + CAD and + Past MI  + dysrhythmias Atrial Fibrillation  Rhythm:Irregular Rate:Normal     Neuro/Psych Anxiety negative neurological ROS     GI/Hepatic Neg liver ROS, GERD  ,  Endo/Other  diabetesMorbid obesity  Renal/GU Renal disease  negative genitourinary   Musculoskeletal  (+) Arthritis , Osteoarthritis,    Abdominal   Peds  Hematology negative hematology ROS (+)   Anesthesia Other Findings   Reproductive/Obstetrics negative OB ROS                            Anesthesia Physical Anesthesia Plan  ASA: 3  Anesthesia Plan: General   Post-op Pain Management: Minimal or no pain anticipated   Induction: Intravenous  PONV Risk Score and Plan: 2 and Propofol infusion and Treatment may vary due to age or medical condition  Airway Management Planned: Mask  Additional Equipment:   Intra-op Plan:   Post-operative Plan:   Informed Consent: I have reviewed the patients History and Physical, chart, labs and discussed the procedure including the risks, benefits and alternatives for the proposed anesthesia with the patient or authorized representative who has indicated his/her understanding and acceptance.     Dental advisory given  Plan Discussed with: CRNA  Anesthesia Plan Comments:         Anesthesia Quick Evaluation

## 2021-12-08 NOTE — Anesthesia Procedure Notes (Signed)
Procedure Name: General with mask airway Date/Time: 12/08/2021 9:46 AM  Performed by: Kyung Rudd, CRNAPre-anesthesia Checklist: Patient identified, Emergency Drugs available, Suction available, Patient being monitored and Timeout performed Patient Re-evaluated:Patient Re-evaluated prior to induction Oxygen Delivery Method: Ambu bag Preoxygenation: Pre-oxygenation with 100% oxygen Induction Type: IV induction

## 2021-12-08 NOTE — Anesthesia Postprocedure Evaluation (Signed)
Anesthesia Post Note  Patient: Raymond Lutz  Procedure(s) Performed: CARDIOVERSION     Patient location during evaluation: Endoscopy Anesthesia Type: General Level of consciousness: awake and alert Pain management: pain level controlled Vital Signs Assessment: post-procedure vital signs reviewed and stable Respiratory status: spontaneous breathing, nonlabored ventilation and respiratory function stable Cardiovascular status: blood pressure returned to baseline and stable Postop Assessment: no apparent nausea or vomiting Anesthetic complications: no   No notable events documented.  Last Vitals:  Vitals:   12/08/21 1010 12/08/21 1020  BP: 124/77 125/78  Pulse: (!) 52 (!) 52  Resp: 16 10  Temp:    SpO2: 96% 98%    Last Pain:  Vitals:   12/08/21 1020  TempSrc:   PainSc: 0-No pain                 Saman Giddens,W. EDMOND

## 2021-12-08 NOTE — CV Procedure (Signed)
Procedure:   DCCV  Indication:  Symptomatic atrial fibrillation  Procedure Note:  The patient signed informed consent.  They have had had therapeutic anticoagulation with apixaban greater than 3 weeks.  Anesthesia was administered by Dr. Ola Spurr.  Patient received 60 mg IV lidocaine and 130 mg IV propofol.Adequate airway was maintained throughout and vital followed per protocol.  They were cardioverted x 2 with 150, 200J of biphasic synchronized energy with anterior pressure.  They converted to sinus bradycardia in the 50s.  There were no apparent complications.  The patient had normal neuro status and respiratory status post procedure with vitals stable as recorded elsewhere.    Follow up:  They will continue on current medical therapy and follow up with cardiology as scheduled.  Buford Dresser, MD PhD 12/08/2021 9:51 AM

## 2021-12-08 NOTE — Interval H&P Note (Signed)
History and Physical Interval Note:  12/08/2021 9:26 AM  Raymond Lutz  has presented today for surgery, with the diagnosis of AFIBC.  The various methods of treatment have been discussed with the patient and family. After consideration of risks, benefits and other options for treatment, the patient has consented to  Procedure(s): CARDIOVERSION (N/A) as a surgical intervention.  The patient's history has been reviewed, patient examined, no change in status, stable for surgery.  I have reviewed the patient's chart and labs.  Questions were answered to the patient's satisfaction.     Shomari Scicchitano Harrell Gave

## 2021-12-08 NOTE — Transfer of Care (Signed)
Immediate Anesthesia Transfer of Care Note  Patient: Raymond Lutz  Procedure(s) Performed: CARDIOVERSION  Patient Location: Endoscopy Unit  Anesthesia Type:General  Level of Consciousness: awake, alert  and oriented  Airway & Oxygen Therapy: Patient Spontanous Breathing  Post-op Assessment: Report given to RN, Post -op Vital signs reviewed and stable and Patient moving all extremities X 4  Post vital signs: Reviewed and stable  Last Vitals:  Vitals Value Taken Time  BP 110/65 12/08/21 0955  Temp 36.6 C 12/08/21 0955  Pulse 55 12/08/21 0957  Resp 15 12/08/21 0957  SpO2 97 % 12/08/21 0957  Vitals shown include unvalidated device data.  Last Pain:  Vitals:   12/08/21 0955  TempSrc: Temporal  PainSc: 0-No pain         Complications: No notable events documented.

## 2021-12-11 ENCOUNTER — Encounter (HOSPITAL_COMMUNITY): Payer: Self-pay | Admitting: Cardiology

## 2021-12-21 ENCOUNTER — Ambulatory Visit: Payer: BC Managed Care – PPO | Admitting: Cardiology

## 2021-12-21 ENCOUNTER — Ambulatory Visit: Payer: BC Managed Care – PPO | Attending: Cardiology

## 2021-12-21 NOTE — Progress Notes (Deleted)
Patient ID: Raymond Lutz                 DOB: January 27, 1966                    MRN: [id number]     HPI: Raymond Lutz is a 56 y.o. male patient referred to lipid clinic by ***. PMH is significant for NSTEMI, HTN, CAD< AV block, A fib, OSA, and T2DM. 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 2013: nonobstructive CAD with 50% OM, 20-30% dLAD, 30-40%dRCA  b. 12/2015: NSTEMI w/ 99% stenosis dLAD(DES placed), 99% 2nd Mrg (DES placed), apical LAD stenosis too small for PCI and mild disease in the RCA. c. 11/2019: NSTEMI showed 90% mid LAD with widely patent distal LAD OM2 and mid left circumflex stents, 30% mid to distal RCA and 20% proximal RCA  s/p PCI of the mid LAD.   DIABETES MELLITUS, TYPE II 10/17/2006   ED (erectile dysfunction)    GERD (gastroesophageal reflux disease)    History of gout    History of kidney stones    HYPERLIPIDEMIA 10/17/2006   HYPERTENSION 10/17/2006   OBESITY 10/17/2006   OSA on CPAP    not using CPAP regularly"need a new one" (02/21/2018)   Paroxysmal atrial fibrillation ([facility]   2006 - was on coumadin - took himself off 6 mos after initiation.   Shortness of breath     Current Outpatient Medications on File Prior to Visit  Medication Sig Dispense Refill   albuterol (VENTOLIN HFA) 108 (90 Base) MCG/ACT inhaler Inhale 2 puffs into the lungs every 4 (four) hours as needed. 18 g 0   amLODipine (NORVASC) 10 MG tablet Take 1 tablet (10 mg total) by mouth daily. 90 tablet 3   apixaban (ELIQUIS) 5 MG TABS tablet Take 1 tablet (5 mg total) by mouth 2 (two) times daily. 180 tablet 3   aspirin  EC 81 MG tablet Take 1 tablet (81 mg total) by mouth daily. 90 tablet 3   atorvastatin (LIPITOR) 80 MG tablet Take 1 tablet (80 mg total) by mouth daily. 90 tablet 3   budesonide (PULMICORT) 1 MG/2ML nebulizer solution Take 1 mg by nebulization as needed (shortness of breath or wheezing).     diclofenac Sodium (VOLTAREN) 1 % GEL Apply 2 g topically daily as needed (pain).     ezetimibe (ZETIA) 10 MG tablet Take 1 tablet (10 mg total) by mouth daily. 90 tablet 3   fluticasone (FLONASE) 50 MCG/ACT nasal spray Place 1 spray into both nostrils daily. (Patient taking differently: Place 1 spray into both nostrils daily as needed for allergies.) 16 g 0   FREESTYLE LITE test strip USE 1 STRIP TO CHECK GLUCOSE ONCE DAILY 100 each 0   furosemide (LASIX) 40 MG tablet Take 1 tablet (40 mg total) by mouth 2 (two) times daily. 180 tablet 3   isosorbide mononitrate (IMDUR) 30 MG 24 hr tablet Take 1 tablet (30 mg total) by mouth daily. 90 tablet 3   LORazepam (ATIVAN) 0.5 MG  tablet Take 1 tablet (0.5 mg total) by mouth every 8 (eight) hours as needed for anxiety. 60 tablet 1   losartan (COZAAR) 100 MG tablet Take 1 tablet (100 mg total) by mouth daily. 90 tablet 3   metFORMIN (GLUCOPHAGE-XR) 500 MG 24 hr tablet Take 1 tablet by mouth once daily with breakfast 90 tablet 2   metoprolol tartrate (LOPRESSOR) 50 MG tablet Take 1 tablet (50 mg total) by mouth 2 (two) times daily. 180 tablet 3   nitroGLYCERIN (NITROSTAT) 0.4 MG SL tablet DISSOLVE ONE TABLET UNDER THE TONGUE EVERY 5 MINUTES AS NEEDED FOR CHEST PAIN.  DO NOT EXCEED A TOTAL OF 3 DOSES IN 15 MINUTES 25 tablet 10   NON FORMULARY Pt uses a c pap nightly     potassium chloride SA (KLOR-CON M) 20 MEQ tablet Take 2 tablets by mouth in the morning 180 tablet 3   Testosterone 1.62 % GEL PLACE 4 APPLICATION ONTO THE SKIN DAILY 75 g 0   No current facility-administered medications on file prior to visit.    No Known Allergies  Assessment/Plan:  1.  Hyperlipidemia -

## 2021-12-23 DIAGNOSIS — G4733 Obstructive sleep apnea (adult) (pediatric): Secondary | ICD-10-CM | POA: Diagnosis not present

## 2021-12-29 ENCOUNTER — Ambulatory Visit: Payer: BC Managed Care – PPO | Attending: Cardiology | Admitting: Physician Assistant

## 2021-12-29 ENCOUNTER — Encounter: Payer: Self-pay | Admitting: Physician Assistant

## 2021-12-29 VITALS — BP 140/70 | HR 61 | Ht 73.0 in | Wt 386.6 lb

## 2021-12-29 DIAGNOSIS — I2510 Atherosclerotic heart disease of native coronary artery without angina pectoris: Secondary | ICD-10-CM | POA: Diagnosis not present

## 2021-12-29 DIAGNOSIS — I4891 Unspecified atrial fibrillation: Secondary | ICD-10-CM | POA: Diagnosis not present

## 2021-12-29 DIAGNOSIS — I480 Paroxysmal atrial fibrillation: Secondary | ICD-10-CM

## 2021-12-29 DIAGNOSIS — G4733 Obstructive sleep apnea (adult) (pediatric): Secondary | ICD-10-CM

## 2021-12-29 DIAGNOSIS — I10 Essential (primary) hypertension: Secondary | ICD-10-CM
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
## 2021-12-29 NOTE — Patient Instructions (Signed)
Medication Instructions:  Your physician recommends that you continue on your current medications as directed. Please refer to the Current Medication list given to you today. *If you need a refill on your cardiac medications before your next appointment, please call your pharmacy*   Lab Work: None Ordered   Testing/Procedures: None ordered   Follow-Up: At [facility], you and your health needs are our priority.  As part of our continuing mission to provide you with exceptional heart care, we have created designated Provider Care Teams.  These Care Teams include your primary Cardiologist (physician) and Advanced Practice Providers (APPs -  Physician Assistants and Nurse Practitioners) who all work together to provide you with the care you need, when you need it.  We recommend signing up for the patient portal called "MyChart".  Sign up information is provided on this After Visit Summary.  MyChart is used to connect with patients for Virtual Visits (Telemedicine).  Patients are able to view lab/test results, encounter notes, upcoming appointments, etc.  Non-urgent messages can be sent to your provider as well.   To learn more about what you can do with MyChart, go to [email or web address].    Your next appointment:   4 month(s)  The format for your next appointment:   In Person  Provider:   Fransico Him, MD     Other Instructions   Important Information About Sugar

## 2022-01-05 ENCOUNTER — Encounter: Payer: Self-pay | Admitting: Family Medicine

## 2022-01-05 ENCOUNTER — Ambulatory Visit (INDEPENDENT_AMBULATORY_CARE_PROVIDER_SITE_OTHER): Payer: BC Managed Care – PPO | Admitting: Family Medicine

## 2022-01-05 VITALS — BP 140/88 | HR 55 | Temp 98.0°F | Ht 73.0 in | Wt 388.0 lb

## 2022-01-05 DIAGNOSIS — Z0000 Encounter for general adult medical examination without abnormal findings: Secondary | ICD-10-CM

## 2022-01-05 LAB — LIPID PANEL
Cholesterol: 127 mg/dL (ref 0–200)
HDL: 43.2 mg/dL (ref >39.00)
LDL Cholesterol: 72 mg/dL (ref 0–99)
NonHDL: 83.63
Total CHOL/HDL Ratio: 3
Triglycerides: 58 mg/dL (ref 0.0–149.0)
VLDL: 11.6 mg/dL (ref 0.0–40.0)

## 2022-01-05 LAB — HEPATIC FUNCTION PANEL
ALT: 20 U/L (ref 0–53)
AST: 18 U/L (ref 0–37)
Albumin: 4.5 g/dL (ref 3.5–5.2)
Alkaline Phosphatase: 55 U/L (ref 39–117)
Bilirubin, Direct: 0.1 mg/dL (ref 0.0–0.3)
Total Bilirubin: 0.5 mg/dL (ref 0.2–1.2)
Total Protein: 7.1 g/dL (ref 6.0–8.3)

## 2022-01-05 LAB — BASIC METABOLIC PANEL
BUN: 18 mg/dL (ref 6–23)
CO2: 28 mEq/L (ref 19–32)
Calcium: 9.6 mg/dL (ref 8.4–10.5)
Chloride: 103 mEq/L (ref 96–112)
Creatinine, Ser: 0.84 mg/dL (ref 0.40–1.50)
GFR: 98.57 mL/min (ref >60.00)
Glucose, Bld: 152 mg/dL — ABNORMAL HIGH (ref 70–99)
Potassium: 4.1 mEq/L (ref 3.5–5.1)
Sodium: 139 mEq/L (ref 135–145)

## 2022-01-05 LAB — TSH: TSH: 1.37 u[IU]/mL (ref 0.35–5.50)

## 2022-01-05 LAB — CBC WITH DIFFERENTIAL/PLATELET
Basophils Absolute: 0 10*3/uL (ref 0.0–0.1)
Basophils Relative: 1 % (ref 0.0–3.0)
Eosinophils Absolute: 0.3 10*3/uL (ref 0.0–0.7)
Eosinophils Relative: 6.5 % — ABNORMAL HIGH (ref 0.0–5.0)
HCT: 42.8 % (ref 39.0–52.0)
Hemoglobin: 13.9 g/dL (ref 13.0–17.0)
Lymphocytes Relative: 39.3 % (ref 12.0–46.0)
Lymphs Abs: 1.8 10*3/uL (ref 0.7–4.0)
MCHC: 32.5 g/dL (ref 30.0–36.0)
MCV: 82.3 fl (ref 78.0–100.0)
Monocytes Absolute: 0.5 10*3/uL (ref 0.1–1.0)
Monocytes Relative: 11.8 % (ref 3.0–12.0)
Neutro Abs: 1.8 10*3/uL (ref 1.4–7.7)
Neutrophils Relative %: 41.4 % — ABNORMAL LOW (ref 43.0–77.0)
Platelets: 236 10*3/uL (ref 150.0–400.0)
RBC: 5.2 Mil/uL (ref 4.22–5.81)
RDW: 15.2 % (ref 11.5–15.5)
WBC: 4.5 10*3/uL (ref 4.0–10.5)

## 2022-01-05 LAB — TESTOSTERONE: Testosterone: 377.65 ng/dL (ref 300.00–890.00)

## 2022-01-05 LAB — HEMOGLOBIN A1C: Hgb A1c MFr Bld: 7.8 % — ABNORMAL HIGH (ref 4.6–6.5)

## 2022-01-05 LAB — PSA: PSA: 0.15 ng/mL (ref 0.10–4.00)

## 2022-01-05 MED ORDER — TESTOSTERONE 1.62 % TD GEL: TRANSDERMAL | 3 refills | Status: DC

## 2022-01-05 MED ORDER — LORAZEPAM 0.5 MG PO TABS: 0.5000 mg | ORAL_TABLET | Freq: Three times a day (TID) | ORAL | 2 refills | Status: DC | PRN

## 2022-01-05 NOTE — Addendum Note (Signed)
Addended by: Alysia Penna A on: 01/05/2022 10:23 AM   Modules accepted: Orders

## 2022-01-05 NOTE — Progress Notes (Signed)
Subjective:    Patient ID: Raymond Lutz, male    DOB: 07-09-1965, 56 y.o.   MRN: [id number]  HPI Here for a well exam. He feel well in general although he knows he is overweight. He had a cardioversion on 12-08-21 which was successful, and he has been in sinus rhythm since then. His BP has been stable.    Review of Systems  Constitutional: Negative.   HENT: Negative.    Eyes: Negative.   Respiratory: Negative.    Cardiovascular: Negative.   Gastrointestinal: Negative.   Genitourinary: Negative.   Musculoskeletal: Negative.   Skin: Negative.   Neurological: Negative.   Psychiatric/Behavioral: Negative.         Objective:   Physical Exam Constitutional:      General: He is not in acute distress.    Appearance: He is well-developed. He is obese. He is not diaphoretic.  HENT:     Head: Normocephalic and atraumatic.     Right Ear: External ear normal.     Left Ear: External ear normal.     Nose: Nose normal.     Mouth/Throat:     Pharynx: No oropharyngeal exudate.  Eyes:     General: No scleral icterus.       Right eye: No discharge.        Left eye: No discharge.     Conjunctiva/sclera: Conjunctivae normal.     Pupils: Pupils are equal, round, and reactive to light.  Neck:     Thyroid: No thyromegaly.     Vascular: No JVD.     Trachea: No tracheal deviation.  Cardiovascular:     Rate and Rhythm: Normal rate and regular rhythm.     Heart sounds: Normal heart sounds. No murmur heard.    No friction rub. No gallop.  Pulmonary:     Effort: Pulmonary effort is normal. No respiratory distress.     Breath sounds: Normal breath sounds. No wheezing or rales.  Chest:     Chest wall: No tenderness.  Abdominal:     General: Bowel sounds are normal. There is no distension.     Palpations: Abdomen is soft. There is no mass.     Tenderness: There is no abdominal tenderness. There is no guarding or rebound.  Genitourinary:    Penis: Normal. No tenderness.      Testes:  Normal.     Prostate: Normal.     Rectum: Normal. Guaiac result negative.  Musculoskeletal:        General: No tenderness. Normal range of motion.     Cervical back: Neck supple.  Lymphadenopathy:     Cervical: No cervical adenopathy.  Skin:    General: Skin is warm and dry.     Coloration: Skin is not pale.     Findings: No erythema or rash.  Neurological:     Mental Status: He is alert and oriented to person, place, and time.     Cranial Nerves: No cranial nerve deficit.     Motor: No abnormal muscle tone.     Coordination: Coordination normal.     Deep Tendon Reflexes: Reflexes are normal and symmetric. Reflexes normal.  Psychiatric:        Behavior: Behavior normal.        Thought Content: Thought content normal.        Judgment: Judgment normal.           Assessment & Plan:  Well exam. We discussed diet and exercise. Get  fasting labs. Set up for his first colonoscopy. He will see Dr. Radford Pax again in February. Alysia Penna, MD

## 2022-01-10 ENCOUNTER — Other Ambulatory Visit: Payer: Self-pay

## 2022-01-10 DIAGNOSIS — E118 Type 2 diabetes mellitus with unspecified complications: Secondary | ICD-10-CM

## 2022-01-10 MED ORDER — GLIPIZIDE 5 MG PO TABS: 5.0000 mg | ORAL_TABLET | Freq: Two times a day (BID) | ORAL | 3 refills | Status: DC

## 2022-01-10 NOTE — Progress Notes (Signed)
A1c

## 2022-01-16 ENCOUNTER — Encounter: Payer: Self-pay | Admitting: Family Medicine

## 2022-01-16 ENCOUNTER — Telehealth: Payer: Self-pay

## 2022-01-16 ENCOUNTER — Ambulatory Visit: Payer: BC Managed Care – PPO | Admitting: Family Medicine

## 2022-01-16 VITALS — BP 136/82 | HR 60 | Temp 97.5°F | Wt 388.0 lb

## 2022-01-16 DIAGNOSIS — T50Z95A Adverse effect of other vaccines and biological substances, initial encounter: Secondary | ICD-10-CM

## 2022-01-16 DIAGNOSIS — R509 Fever, unspecified: Secondary | ICD-10-CM

## 2022-01-16 DIAGNOSIS — R5383 Other fatigue: Secondary | ICD-10-CM

## 2022-01-16 DIAGNOSIS — M791 Myalgia, unspecified site: Secondary | ICD-10-CM

## 2022-01-16 LAB — POC COVID19 BINAXNOW: SARS Coronavirus 2 Ag: NEGATIVE

## 2022-01-16 LAB — POC INFLUENZA A&B (BINAX/QUICKVUE)
Influenza A, POC: NEGATIVE
Influenza B, POC: NEGATIVE

## 2022-01-16 NOTE — Telephone Encounter (Signed)
-  The caller states that he has a headache and fatigue. Had Shingles vaccine on Saturday. Fever 100-101 orally. Has nasal congestion  01/16/2022 9:49:32 AM [facility], RN, Three Points Advice Given Per Guideline * There is no need to see your doctor (or NP/PA) for normal reactions, such as pain, swelling, redness or fever. * Headache, muscle aches, fatigue. * Pain, redness, or swelling at the injection site (80%). * Fever or chills. * Fever lasts over 3 days * Redness or swelling lasts over 3 days  Pt has appt with PCP today at 3pm

## 2022-01-16 NOTE — Progress Notes (Signed)
   Subjective:    Patient ID: Raymond Lutz, male    DOB: 09/09/1965, 56 y.o.   MRN: [id number]  HPI Here for a possible reaction to a shingles vaccine. He had no problems with his first shingles vaccine a few months ago, but after her got the second one 2 days ago his right arm immediately began to throb with pain. He then developed a fever, fatigue, and diffuse muscle aches. No ST or cough or rash or NVD. He is drinking fluids and taking Tylenol.    Review of Systems  Constitutional:  Positive for fatigue and fever.  HENT: Negative.    Eyes: Negative.   Respiratory: Negative.    Cardiovascular: Negative.   Gastrointestinal: Negative.   Genitourinary: Negative.   Musculoskeletal:  Positive for myalgias.       Objective:   Physical Exam Constitutional:      Appearance: He is ill-appearing.  HENT:     Right Ear: Tympanic membrane, ear canal and external ear normal.     Left Ear: Tympanic membrane, ear canal and external ear normal.     Nose: Nose normal.     Mouth/Throat:     Pharynx: Oropharynx is clear.  Eyes:     Conjunctiva/sclera: Conjunctivae normal.  Cardiovascular:     Rate and Rhythm: Normal rate and regular rhythm.     Pulses: Normal pulses.     Heart sounds: Normal heart sounds.  Pulmonary:     Effort: Pulmonary effort is normal.     Breath sounds: Normal breath sounds.  Lymphadenopathy:     Cervical: No cervical adenopathy.  Neurological:     Mental Status: He is alert.           Assessment & Plan:  He is having a reaction to the second shingles vaccine. This will resolve over the next few days. He will continue with fluids and Tylenol. Recheck as needed.  Alysia Penna, MD

## 2022-01-23 DIAGNOSIS — G4733 Obstructive sleep apnea (adult) (pediatric): Secondary | ICD-10-CM | POA: Diagnosis not present

## 2022-01-29 ENCOUNTER — Other Ambulatory Visit: Payer: Self-pay | Admitting: Cardiology

## 2022-02-16 ENCOUNTER — Other Ambulatory Visit: Payer: Self-pay | Admitting: Cardiology

## 2022-02-16 DIAGNOSIS — Z760 Encounter for issue of repeat prescription: Secondary | ICD-10-CM

## 2022-02-16 DIAGNOSIS — I10 Essential (primary) hypertension: Secondary | ICD-10-CM

## 2022-02-16 NOTE — Telephone Encounter (Signed)
Rx refill sent to pharmacy. 

## 2022-02-19 IMAGING — CR DG CHEST 2V
2 series · 2 of 2 positions shown · non-contrast
Comparison: Chest radiographs 11/12/2019 and earlier.

CLINICAL DATA: 54-year-old male with left side chest pain onset
last night.

EXAM:
CHEST - 2 VIEW

[chest pa]
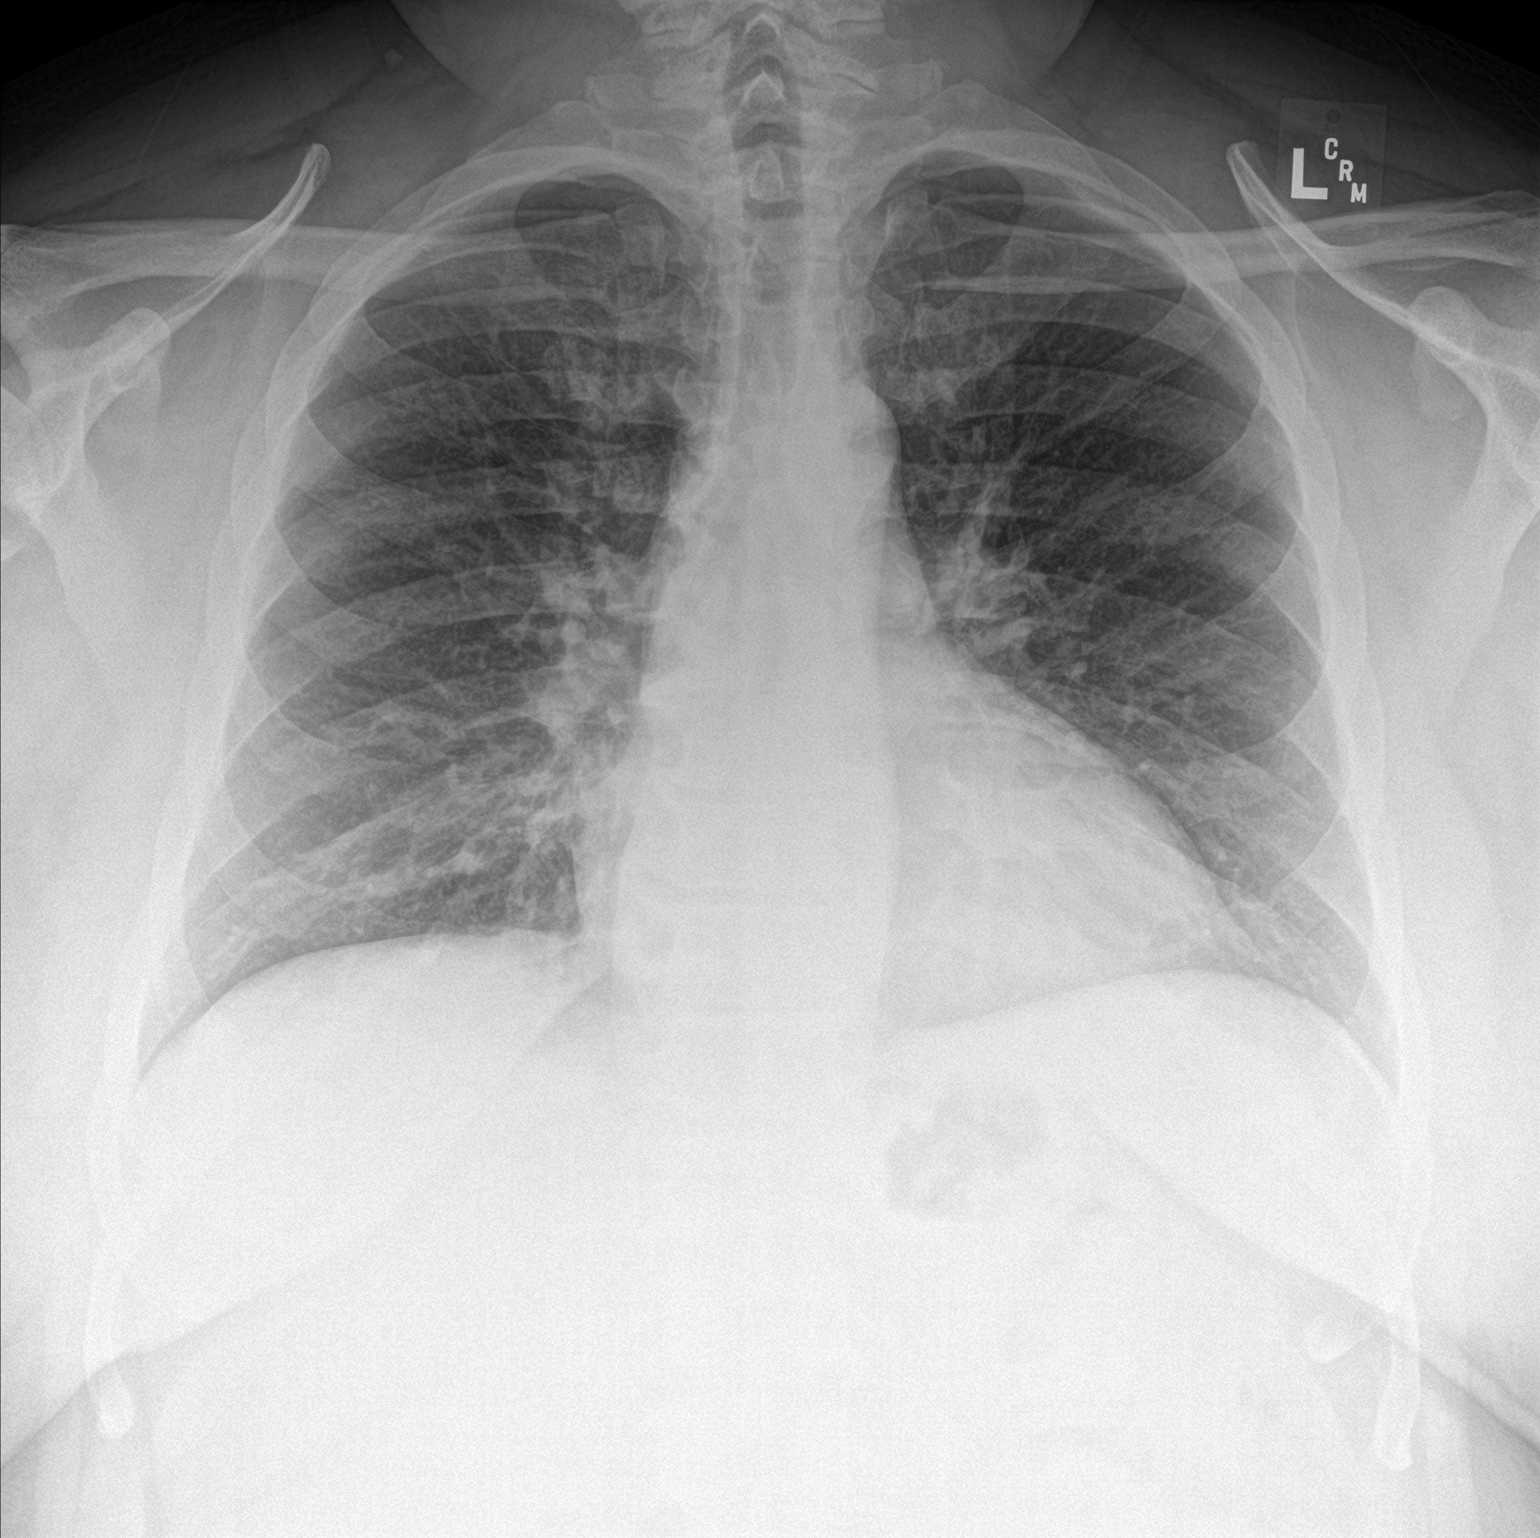

[chest lat]
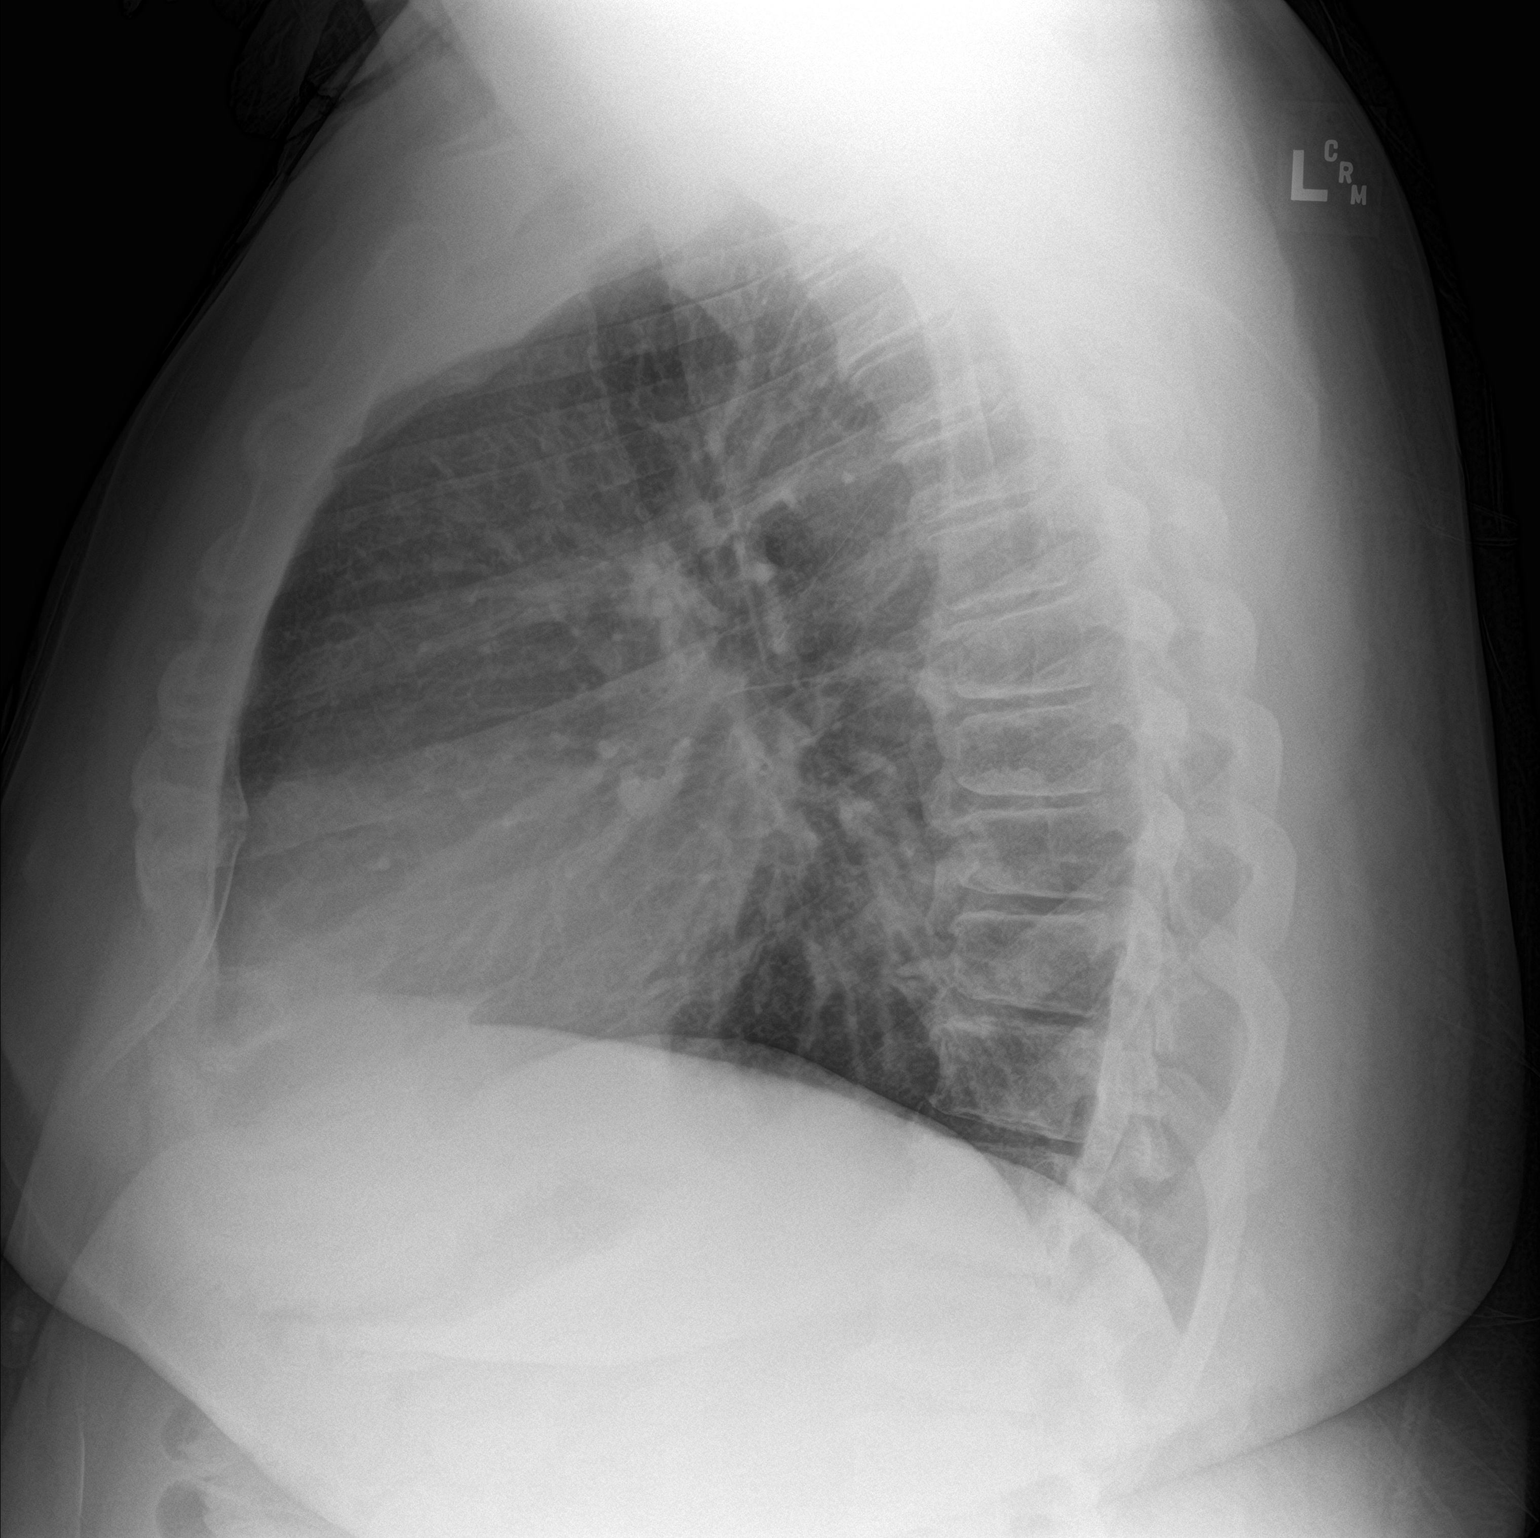

[2 of 2 positions shown; findings below may reference images not displayed]

FINDINGS: Lung volumes and mediastinal contours remain within normal limits.
Visualized tracheal air column is within normal limits. No
pneumothorax, pulmonary edema or pleural effusion. Lung markings
have not significantly changed since 1074. No convincing acute
pulmonary opacity.

No acute osseous abnormality identified. Bulky degenerative endplate
osteophytes in the thoracic spine. Negative visible bowel gas
pattern.
IMPRESSION: No acute cardiopulmonary abnormality.

## 2022-02-22 DIAGNOSIS — G4733 Obstructive sleep apnea (adult) (pediatric): Secondary | ICD-10-CM | POA: Diagnosis not present

## 2022-02-23 ENCOUNTER — Ambulatory Visit: Payer: BC Managed Care – PPO | Admitting: Family Medicine

## 2022-03-02 ENCOUNTER — Ambulatory Visit: Payer: BC Managed Care – PPO | Admitting: Family Medicine

## 2022-03-09 ENCOUNTER — Other Ambulatory Visit: Payer: Self-pay | Admitting: Family Medicine

## 2022-03-09 DIAGNOSIS — J4520 Mild intermittent asthma, uncomplicated: Secondary | ICD-10-CM

## 2022-03-22 ENCOUNTER — Other Ambulatory Visit (HOSPITAL_COMMUNITY): Payer: Self-pay

## 2022-03-30 ENCOUNTER — Other Ambulatory Visit: Payer: Self-pay | Admitting: Cardiology

## 2022-04-09 ENCOUNTER — Other Ambulatory Visit: Payer: Self-pay | Admitting: Cardiology

## 2022-04-20 ENCOUNTER — Encounter (HOSPITAL_COMMUNITY): Payer: Self-pay | Admitting: *Deleted

## 2022-04-27 ENCOUNTER — Other Ambulatory Visit: Payer: Self-pay | Admitting: Cardiology

## 2022-04-27 ENCOUNTER — Encounter: Payer: Self-pay | Admitting: Family Medicine

## 2022-04-27 ENCOUNTER — Ambulatory Visit: Payer: BC Managed Care – PPO | Admitting: Family Medicine

## 2022-04-27 VITALS — BP 130/86 | HR 54 | Temp 97.6°F | Wt 396.0 lb

## 2022-04-27 DIAGNOSIS — Z760 Encounter for issue of repeat prescription: Secondary | ICD-10-CM

## 2022-04-27 DIAGNOSIS — N529 Male erectile dysfunction, unspecified: Secondary | ICD-10-CM | POA: Diagnosis not present

## 2022-04-27 DIAGNOSIS — I10 Essential (primary) hypertension: Secondary | ICD-10-CM

## 2022-04-27 MED ORDER — TADALAFIL 20 MG PO TABS: 20.0000 mg | ORAL_TABLET | ORAL | 11 refills | Status: DC | PRN

## 2022-04-27 NOTE — Progress Notes (Signed)
   Subjective:    Patient ID: Raymond Lutz, male    DOB: Aug 04, 1965, 57 y.o.   MRN: [id number]  HPI Here to discuss his ED. He had used Cialis years ago and it worked well for him. However it was expensive, so we tried testosterone gel instead. His insurance company is not willing to cover this, so he asks for our advice.    Review of Systems  Constitutional: Negative.   Respiratory: Negative.    Cardiovascular: Negative.        Objective:   Physical Exam Constitutional:      Appearance: Normal appearance.  Cardiovascular:     Rate and Rhythm: Normal rate and regular rhythm.     Pulses: Normal pulses.     Heart sounds: Normal heart sounds.  Pulmonary:     Effort: Pulmonary effort is normal.     Breath sounds: Normal breath sounds.  Neurological:     Mental Status: He is alert.           Assessment & Plan:  ED, he will get back on Cialis 20 mg as needed.  Alysia Penna, MD

## 2022-05-02 ENCOUNTER — Ambulatory Visit: Payer: BC Managed Care – PPO | Admitting: Cardiology
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
## 2022-05-18 ENCOUNTER — Encounter: Payer: Self-pay | Admitting: Physician Assistant

## 2022-05-18 ENCOUNTER — Ambulatory Visit: Payer: BC Managed Care – PPO | Attending: Physician Assistant | Admitting: Physician Assistant

## 2022-05-18 VITALS — BP 130/68 | HR 56 | Ht 73.0 in | Wt >= 6400 oz

## 2022-05-18 DIAGNOSIS — R600 Localized edema: Secondary | ICD-10-CM | POA: Diagnosis not present

## 2022-05-18 DIAGNOSIS — E785 Hyperlipidemia, unspecified: Secondary | ICD-10-CM

## 2022-05-18 DIAGNOSIS — Z760 Encounter for issue of repeat prescription: Secondary | ICD-10-CM | POA: Diagnosis not present

## 2022-05-18 DIAGNOSIS — G4730 Sleep apnea, unspecified: Secondary | ICD-10-CM

## 2022-05-18 DIAGNOSIS — I10 Essential (primary) hypertension: Secondary | ICD-10-CM

## 2022-05-18 DIAGNOSIS — I480 Paroxysmal atrial fibrillation: Secondary | ICD-10-CM

## 2022-05-18 NOTE — Patient Instructions (Addendum)
Medication Instructions:   Your physician recommends that you continue on your current medications as directed. Please refer to the Current Medication list given to you today.   *If you need a refill on your cardiac medications before your next appointment, please call your pharmacy*   Lab Work:  RETURN FOR LABS LFT AND LIPIDS IN 6 MONTHS BEFORE  FOLLOW UP APPOINTMENT   If you have labs (blood work) drawn today and your tests are completely normal, you will receive your results only by: [place] (if you have MyChart) OR A paper copy in the mail If you have any lab test that is abnormal or we need to change your treatment, we will call you to review the results.   Testing/Procedures: NONE ORDERED  TODAY     Follow-Up: At [facility], you and your health needs are our priority.  As part of our continuing mission to provide you with exceptional heart care, we have created designated Provider Care Teams.  These Care Teams include your primary Cardiologist (physician) and Advanced Practice Providers (APPs -  Physician Assistants and Nurse Practitioners) who all work together to provide you with the care you need, when you need it.  We recommend signing up for the patient portal called "MyChart".  Sign up information is provided on this After Visit Summary.  MyChart is used to connect with patients for Virtual Visits (Telemedicine).  Patients are able to view lab/test results, encounter notes, upcoming appointments, etc.  Non-urgent messages can be sent to your provider as well.   To learn more about what you can do with MyChart, go to [email or web address].    Your next appointment:   6 month(s)  Provider:   Fransico Him, MD     Other Instructions  YOU HAVE BEEN ENCOURAGE TO EXERCISE 30 MINUTES  5 DAYS A WEEK   Low-Sodium Eating Plan Sodium, which is an element that makes up salt, helps you maintain a healthy balance of fluids in your body. Too much sodium can  increase your blood pressure and cause fluid and waste to be held in your body. Your health care provider or dietitian may recommend following this plan if you have high blood pressure (hypertension), kidney disease, liver disease, or heart failure. Eating less sodium can help lower your blood pressure, reduce swelling, and protect your heart, liver, and kidneys. What are tips for following this plan? Reading food labels The Nutrition Facts label lists the amount of sodium in one serving of the food. If you eat more than one serving, you must multiply the listed amount of sodium by the number of servings. Choose foods with less than 140 mg of sodium per serving. Avoid foods with 300 mg of sodium or more per serving. Shopping  Look for lower-sodium products, often labeled as "low-sodium" or "no salt added." Always check the sodium content, even if foods are labeled as "unsalted" or "no salt added." Buy fresh foods. Avoid canned foods and pre-made or frozen meals. Avoid canned, cured, or processed meats. Buy breads that have less than 80 mg of sodium per slice. Cooking  Eat more home-cooked food and less restaurant, buffet, and fast food. Avoid adding salt when cooking. Use salt-free seasonings or herbs instead of table salt or sea salt. Check with your health care provider or pharmacist before using salt substitutes. Cook with plant-based oils, such as canola, sunflower, or olive oil. Meal planning When eating at a restaurant, ask that your food be prepared with less salt  or no salt, if possible. Avoid dishes labeled as brined, pickled, cured, smoked, or made with soy sauce, miso, or teriyaki sauce. Avoid foods that contain MSG (monosodium glutamate). MSG is sometimes added to [place] food, bouillon, and some canned foods. Make meals that can be grilled, baked, poached, roasted, or steamed. These are generally made with less sodium. General information Most people on this plan should limit  their sodium intake to 1,500-2,000 mg (milligrams) of sodium each day. What foods should I eat? Fruits Fresh, frozen, or canned fruit. Fruit juice. Vegetables Fresh or frozen vegetables. "No salt added" canned vegetables. "No salt added" tomato sauce and paste. Low-sodium or reduced-sodium tomato and vegetable juice. Grains Low-sodium cereals, including oats, puffed wheat and rice, and shredded wheat. Low-sodium crackers. Unsalted rice. Unsalted pasta. Low-sodium bread. Whole-grain breads and whole-grain pasta. Meats and other proteins Fresh or frozen (no salt added) meat, poultry, seafood, and fish. Low-sodium canned tuna and salmon. Unsalted nuts. Dried peas, beans, and lentils without added salt. Unsalted canned beans. Eggs. Unsalted nut butters. Dairy Milk. Soy milk. Cheese that is naturally low in sodium, such as ricotta cheese, fresh mozzarella, or Swiss cheese. Low-sodium or reduced-sodium cheese. Cream cheese. Yogurt. Seasonings and condiments Fresh and dried herbs and spices. Salt-free seasonings. Low-sodium mustard and ketchup. Sodium-free salad dressing. Sodium-free light mayonnaise. Fresh or refrigerated horseradish. Lemon juice. Vinegar. Other foods Homemade, reduced-sodium, or low-sodium soups. Unsalted popcorn and pretzels. Low-salt or salt-free chips. The items listed above may not be a complete list of foods and beverages you can eat. Contact a dietitian for more information. What foods should I avoid? Vegetables Sauerkraut, pickled vegetables, and relishes. Olives. [place] fries. Onion rings. Regular canned vegetables (not low-sodium or reduced-sodium). Regular canned tomato sauce and paste (not low-sodium or reduced-sodium). Regular tomato and vegetable juice (not low-sodium or reduced-sodium). Frozen vegetables in sauces. Grains Instant hot cereals. Bread stuffing, pancake, and biscuit mixes. Croutons. Seasoned rice or pasta mixes. Noodle soup cups. Boxed or frozen macaroni  and cheese. Regular salted crackers. Self-rising flour. Meats and other proteins Meat or fish that is salted, canned, smoked, spiced, or pickled. Precooked or cured meat, such as sausages or meat loaves. Berniece Salines. Ham. Pepperoni. Hot dogs. Corned beef. Chipped beef. Salt pork. Jerky. Pickled herring. Anchovies and sardines. Regular canned tuna. Salted nuts. Dairy Processed cheese and cheese spreads. Hard cheeses. Cheese curds. Blue cheese. Feta cheese. String cheese. Regular cottage cheese. Buttermilk. Canned milk. Fats and oils Salted butter. Regular margarine. Ghee. Bacon fat. Seasonings and condiments Onion salt, garlic salt, seasoned salt, table salt, and sea salt. Canned and packaged gravies. Worcestershire sauce. Tartar sauce. Barbecue sauce. Teriyaki sauce. Soy sauce, including reduced-sodium. Steak sauce. Fish sauce. Oyster sauce. Cocktail sauce. Horseradish that you find on the shelf. Regular ketchup and mustard. Meat flavorings and tenderizers. Bouillon cubes. Hot sauce. Pre-made or packaged marinades. Pre-made or packaged taco seasonings. Relishes. Regular salad dressings. Salsa. Other foods Salted popcorn and pretzels. Corn chips and puffs. Potato and tortilla chips. Canned or dried soups. Pizza. Frozen entrees and pot pies. The items listed above may not be a complete list of foods and beverages you should avoid. Contact a dietitian for more information. Summary Eating less sodium can help lower your blood pressure, reduce swelling, and protect your heart, liver, and kidneys. Most people on this plan should limit their sodium intake to 1,500-2,000 mg (milligrams) of sodium each day. Canned, boxed, and frozen foods are high in sodium. Restaurant foods, fast foods, and pizza are also  very high in sodium. You also get sodium by adding salt to food. Try to cook at home, eat more fresh fruits and vegetables, and eat less fast food and canned, processed, or prepared foods. This information is  not intended to replace advice given to you by your health care provider. Make sure you discuss any questions you have with your health care provider. Document Revised: 02/03/2019 Document Reviewed: 01/29/2019 Elsevier Patient Education  [place] Eating a healthy diet is important for the health of your heart. A heart-healthy eating plan includes: Eating less unhealthy fats. Eating more healthy fats. Eating less salt in your food. Salt is also called sodium. Making other changes in your diet. Talk with your doctor or a diet specialist (dietitian) to create an eating plan that is right for you. What is my plan? Your doctor may recommend an eating plan that includes: Total fat: ______% or less of total calories a day. Saturated fat: ______% or less of total calories a day. Cholesterol: less than _________mg a day. Sodium: less than _________mg a day. What are tips for following this plan? Cooking Avoid frying your food. Try to bake, boil, grill, or broil it instead. You can also reduce fat by: Removing the skin from poultry. Removing all visible fats from meats. Steaming vegetables in water or broth. Meal planning  At meals, divide your plate into four equal parts: Fill one-half of your plate with vegetables and green salads. Fill one-fourth of your plate with whole grains. Fill one-fourth of your plate with lean protein foods. Eat 2-4 cups of vegetables per day. One cup of vegetables is: 1 cup (91 g) broccoli or cauliflower florets. 2 medium carrots. 1 large bell pepper. 1 large sweet potato. 1 large tomato. 1 medium white potato. 2 cups (150 g) raw leafy greens. Eat 1-2 cups of fruit per day. One cup of fruit is: 1 small apple 1 large banana 1 cup (237 g) mixed fruit, 1 large orange,  cup (82 g) dried fruit, 1 cup (240 mL) 100% fruit juice. Eat more foods that have soluble fiber. These are apples, broccoli, carrots, beans, peas,  and barley. Try to get 20-30 g of fiber per day. Eat 4-5 servings of nuts, legumes, and seeds per week: 1 serving of dried beans or legumes equals  cup (90 g) cooked. 1 serving of nuts is  oz (12 almonds, 24 pistachios, or 7 walnut halves). 1 serving of seeds equals  oz (8 g). General information Eat more home-cooked food. Eat less restaurant, buffet, and fast food. Limit or avoid alcohol. Limit foods that are high in starch and sugar. Avoid fried foods. Lose weight if you are overweight. Keep track of how much salt (sodium) you eat. This is important if you have high blood pressure. Ask your doctor to tell you more about this. Try to add vegetarian meals each week. Fats Choose healthy fats. These include olive oil and canola oil, flaxseeds, walnuts, almonds, and seeds. Eat more omega-3 fats. These include salmon, mackerel, sardines, tuna, flaxseed oil, and ground flaxseeds. Try to eat fish at least 2 times each week. Check food labels. Avoid foods with trans fats or high amounts of saturated fat. Limit saturated fats. These are often found in animal products, such as meats, butter, and cream. These are also found in plant foods, such as palm oil, palm kernel oil, and coconut oil. Avoid foods with partially hydrogenated oils in them. These have trans fats. Examples  are stick margarine, some tub margarines, cookies, crackers, and other baked goods. What foods should I eat? Fruits All fresh, canned (in natural juice), or frozen fruits. Vegetables Fresh or frozen vegetables (raw, steamed, roasted, or grilled). Green salads. Grains Most grains. Choose whole wheat and whole grains most of the time. Rice and pasta, including brown rice and pastas made with whole wheat. Meats and other proteins Lean, well-trimmed beef, veal, pork, and lamb. Chicken and [place] without skin. All fish and shellfish. Wild duck, rabbit, pheasant, and venison. Egg whites or low-cholesterol egg substitutes. Dried  beans, peas, lentils, and tofu. Seeds and most nuts. Dairy Low-fat or nonfat cheeses, including ricotta and mozzarella. Skim or 1% milk that is liquid, powdered, or evaporated. Buttermilk that is made with low-fat milk. Nonfat or low-fat yogurt. Fats and oils Non-hydrogenated (trans-free) margarines. Vegetable oils, including soybean, sesame, sunflower, olive, peanut, safflower, corn, canola, and cottonseed. Salad dressings or mayonnaise made with a vegetable oil. Beverages Mineral water. Coffee and tea. Diet carbonated beverages. Sweets and desserts Sherbet, gelatin, and fruit ice. Small amounts of dark chocolate. Limit all sweets and desserts. Seasonings and condiments All seasonings and condiments. The items listed above may not be a complete list of foods and drinks you can eat. Contact a dietitian for more options. What foods should I avoid? Fruits Canned fruit in heavy syrup. Fruit in cream or butter sauce. Fried fruit. Limit coconut. Vegetables Vegetables cooked in cheese, cream, or butter sauce. Fried vegetables. Grains Breads that are made with saturated or trans fats, oils, or whole milk. Croissants. Sweet rolls. Donuts. High-fat crackers, such as cheese crackers. Meats and other proteins Fatty meats, such as hot dogs, ribs, sausage, bacon, rib-eye roast or steak. High-fat deli meats, such as salami and bologna. Caviar. Domestic duck and goose. Organ meats, such as liver. Dairy Cream, sour cream, cream cheese, and creamed cottage cheese. Whole-milk cheeses. Whole or 2% milk that is liquid, evaporated, or condensed. Whole buttermilk. Cream sauce or high-fat cheese sauce. Yogurt that is made from whole milk. Fats and oils Meat fat, or shortening. Cocoa butter, hydrogenated oils, palm oil, coconut oil, palm kernel oil. Solid fats and shortenings, including bacon fat, salt pork, lard, and butter. Nondairy cream substitutes. Salad dressings with cheese or sour cream. Beverages Regular  sodas and juice drinks with added sugar. Sweets and desserts Frosting. Pudding. Cookies. Cakes. Pies. Milk chocolate or white chocolate. Buttered syrups. Full-fat ice cream or ice cream drinks. The items listed above may not be a complete list of foods and drinks to avoid. Contact a dietitian for more information. Summary Heart-healthy meal planning includes eating less unhealthy fats, eating more healthy fats, and making other changes in your diet. Eat a balanced diet. This includes fruits and vegetables, low-fat or nonfat dairy, lean protein, nuts and legumes, whole grains, and heart-healthy oils and fats. This information is not intended to replace advice given to you by your health care provider. Make sure you discuss any questions you have with your health care provider. Document Revised: 04/04/2021 Document Reviewed: 04/04/2021 Elsevier Patient Education  [place].

## 2022-06-20 ENCOUNTER — Encounter: Payer: Self-pay | Admitting: Family Medicine

## 2022-06-20 ENCOUNTER — Ambulatory Visit: Payer: BC Managed Care – PPO | Admitting: Family Medicine

## 2022-06-20 VITALS — BP 130/80 | HR 63 | Temp 97.9°F | Resp 16 | Ht 73.0 in | Wt >= 6400 oz

## 2022-06-20 DIAGNOSIS — S40022A Contusion of left upper arm, initial encounter: Secondary | ICD-10-CM

## 2022-06-20 DIAGNOSIS — I4891 Unspecified atrial fibrillation: Secondary | ICD-10-CM | POA: Diagnosis not present

## 2022-06-20 DIAGNOSIS — T148XXA Other injury of unspecified body region, initial encounter: Secondary | ICD-10-CM

## 2022-06-20 DIAGNOSIS — Z7901 Long term (current) use of anticoagulants: Secondary | ICD-10-CM

## 2022-06-20 LAB — CBC
HCT: 39.6 % (ref 39.0–52.0)
Hemoglobin: 13 g/dL (ref 13.0–17.0)
MCHC: 32.8 g/dL (ref 30.0–36.0)
MCV: 83 fl (ref 78.0–100.0)
Platelets: 217 10*3/uL (ref 150.0–400.0)
RBC: 4.77 Mil/uL (ref 4.22–5.81)
RDW: 14.9 % (ref 11.5–15.5)
WBC: 4.2 10*3/uL (ref 4.0–10.5)

## 2022-06-20 LAB — BASIC METABOLIC PANEL
BUN: 17 mg/dL (ref 6–23)
CO2: 25 mEq/L (ref 19–32)
Calcium: 9 mg/dL (ref 8.4–10.5)
Chloride: 108 mEq/L (ref 96–112)
Creatinine, Ser: 0.76 mg/dL (ref 0.40–1.50)
GFR: 101.27 mL/min (ref >60.00)
Glucose, Bld: 104 mg/dL — ABNORMAL HIGH (ref 70–99)
Potassium: 3.8 mEq/L (ref 3.5–5.1)
Sodium: 140 mEq/L (ref 135–145)

## 2022-06-20 NOTE — Progress Notes (Signed)
ACUTE VISIT Chief Complaint  Patient presents with   bruise    Noticed on Sunday, on blood thinners   HPI: Raymond Lutz is a 57 y.o. male, who is here today complaining of tender bruise on his left arm, which he noticed upon waking 2 days ago.  He reports that the bruise seems to have enlarged slightly since its initial appearance and noted that is has been tender since this morning. He denies any recent activities that might have caused the bruise, such as exercise or heavy lifting. He also reports no associated symptoms such as nosebleeds, gum bleeding,unusual headaches, conjunctival hemorrhages, melena, blood in stool or gross hematuria. Negative for abdominal pain, nausea, and vomiting.  He has been taking Eliquis since approximately August or September /2023. He confirms no recent additions to his medication regimen, including over-the-counter drugs, herbs, or supplements. There are no other instances of bruising reported on his body.  He took Ibuprofen x 1 Sunday. Lab Results  Component Value Date   WBC 4.5 01/05/2022   HGB 13.9 01/05/2022   HCT 42.8 01/05/2022   MCV 82.3 01/05/2022   PLT 236.0 01/05/2022   Lab Results  Component Value Date   CREATININE 0.84 01/05/2022   BUN 18 01/05/2022   NA 139 01/05/2022   K 4.1 01/05/2022   CL 103 01/05/2022   CO2 28 01/05/2022   Review of Systems  Constitutional:  Negative for chills, fever and unexpected weight change.  Eyes:  Negative for photophobia and visual disturbance.  Respiratory:  Negative for cough, shortness of breath and wheezing.   Musculoskeletal:  Negative for gait problem and myalgias.  Skin:  Negative for rash.  Neurological:  Negative for syncope, facial asymmetry and weakness.  See other pertinent positives and negatives in HPI.  Current Outpatient Medications on File Prior to Visit  Medication Sig Dispense Refill   albuterol (VENTOLIN HFA) 108 (90 Base) MCG/ACT inhaler INHALE 2 PUFFS BY MOUTH  INTO THE LUNGS EVERY 4 HOURS AS NEEDED 18 g 0   amLODipine (NORVASC) 10 MG tablet Take 1 tablet by mouth once daily 90 tablet 3   apixaban (ELIQUIS) 5 MG TABS tablet Take 1 tablet (5 mg total) by mouth 2 (two) times daily. 180 tablet 3   aspirin EC 81 MG tablet Take 1 tablet (81 mg total) by mouth daily. 90 tablet 3   atorvastatin (LIPITOR) 80 MG tablet Take 1 tablet by mouth once daily 90 tablet 2   budesonide (PULMICORT) 1 MG/2ML nebulizer solution Take 1 mg by nebulization as needed (shortness of breath or wheezing).     diclofenac Sodium (VOLTAREN) 1 % GEL Apply 2 g topically daily as needed (pain).     ezetimibe (ZETIA) 10 MG tablet Take 1 tablet by mouth once daily 90 tablet 2   fluticasone (FLONASE) 50 MCG/ACT nasal spray Place 1 spray into both nostrils daily. (Patient taking differently: Place 1 spray into both nostrils daily as needed for allergies.) 16 g 0   FREESTYLE LITE test strip USE 1 STRIP TO CHECK GLUCOSE ONCE DAILY 100 each 0   furosemide (LASIX) 40 MG tablet Take 1 tablet by mouth twice daily 180 tablet 2   glipiZIDE (GLUCOTROL) 5 MG tablet Take 1 tablet (5 mg total) by mouth 2 (two) times daily before a meal. 180 tablet 3   isosorbide mononitrate (IMDUR) 30 MG 24 hr tablet Take 1 tablet by mouth once daily 90 tablet 3   LORazepam (ATIVAN) 0.5 MG tablet Take  1 tablet (0.5 mg total) by mouth every 8 (eight) hours as needed for anxiety. 60 tablet 2   losartan (COZAAR) 100 MG tablet Take 1 tablet by mouth once daily 90 tablet 0   metFORMIN (GLUCOPHAGE-XR) 500 MG 24 hr tablet Take 1 tablet by mouth once daily with breakfast 90 tablet 2   metoprolol tartrate (LOPRESSOR) 50 MG tablet Take 1 tablet (50 mg total) by mouth 2 (two) times daily. 180 tablet 3   nitroGLYCERIN (NITROSTAT) 0.4 MG SL tablet DISSOLVE ONE TABLET UNDER THE TONGUE EVERY 5 MINUTES AS NEEDED FOR CHEST PAIN.  DO NOT EXCEED A TOTAL OF 3 DOSES IN 15 MINUTES 25 tablet 10   NON FORMULARY Pt uses a c pap nightly      Potassium Chloride ER 20 MEQ TBCR Take 40 mg by mouth every morning. TAKE 2 TABLETS BY MOUTH IN THE MORNING 180 tablet 2   scopolamine (TRANSDERM-SCOP) 1 MG/3DAYS 1 patch as needed. For nausea     tadalafil (CIALIS) 20 MG tablet Take 1 tablet (20 mg total) by mouth as needed for erectile dysfunction. 10 tablet 11   No current facility-administered medications on file prior to visit.   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 2013: nonobstructive CAD with 50% OM, 20-30% dLAD, 30-40%dRCA  b. 12/2015: NSTEMI w/ 99% stenosis dLAD(DES placed), 99% 2nd Mrg (DES placed), apical LAD stenosis too small for PCI and mild disease in the RCA. c. 11/2019: NSTEMI showed 90% mid LAD with widely patent distal LAD OM2 and mid left circumflex stents, 30% mid to distal RCA and 20% proximal RCA  s/p PCI of the mid LAD.   DIABETES MELLITUS, TYPE II 10/17/2006   ED (erectile dysfunction)    GERD (gastroesophageal reflux disease)    History of gout    History of kidney stones    HYPERLIPIDEMIA 10/17/2006   HYPERTENSION 10/17/2006   OBESITY 10/17/2006   OSA on CPAP    not using CPAP regularly"need a new one" (02/21/2018)   Paroxysmal atrial fibrillation    2006 - was on coumadin - took himself off 6 mos after initiation.   Shortness of breath    No Known Allergies  Social History   Socioeconomic History   Marital status: Married    Spouse name: Not on file   Number of children: Not on file   Years of education: Not on file   Highest education level: Bachelor's degree (e.g., BA, AB, BS)  Occupational History   Not on file  Tobacco Use   Smoking status: Never   Smokeless tobacco: Never  Vaping Use   Vaping Use: Never used  Substance and Sexual Activity   Alcohol use: Not Currently   Drug use: No   Sexual activity: Yes  Other Topics Concern   Not on file  Social History Narrative   Works at [employer].  Lives in [place] with wife - 2 children - 13, 10.   Social Determinants of Health   Financial Resource Strain: Medium Risk (04/26/2022)   Overall Financial Resource Strain (CARDIA)    Difficulty of Paying Living Expenses: Somewhat hard  Food Insecurity: No Food Insecurity (04/26/2022)   Hunger Vital Sign    Worried About Running Out of Food in the Last Year: Never true    Ran Out of Food in the Last  Year: Never true  Transportation Needs: No Transportation Needs (04/26/2022)   PRAPARE - Administrator, Civil ServiceTransportation    Lack of Transportation (Medical): No    Lack of Transportation (Non-Medical): No  Physical Activity: Insufficiently Active (04/26/2022)   Exercise Vital Sign    Days of Exercise per Week: 3 days    Minutes of Exercise per Session: 40 min  Stress: No Stress Concern Present (01/20/2021)   [employer] - Occupational Stress Questionnaire    Feeling of Stress : Not at all  Social Connections: Socially Integrated (04/26/2022)   Social Connection and Isolation Panel [NHANES]    Frequency of Communication with Friends and Family: More than three times a week    Frequency of Social Gatherings with Friends and Family: Three times a week    Attends Religious Services: More than 4 times per year    Active Member of Clubs or Organizations: Yes    Attends BankerClub or Organization Meetings: More than 4 times per year    Marital Status: Married   Vitals:   06/20/22 1102  BP: 130/80  Pulse: 63  Resp: 16  Temp: 97.9 F (36.6 C)  SpO2: 99%   Body mass index is 53.7 kg/m.  Physical Exam Vitals and nursing note reviewed.  Constitutional:      General: He is not in acute distress.    Appearance: He is well-developed.  HENT:     Head: Normocephalic and atraumatic.  Eyes:     Conjunctiva/sclera: Conjunctivae normal.  Neck:     Trachea: No tracheal deviation.  Cardiovascular:     Rate and Rhythm: Normal rate and regular rhythm.     Heart sounds: No  murmur heard.    Comments: Distant heart sounds. Pulmonary:     Effort: Pulmonary effort is normal. No respiratory distress.     Breath sounds: Normal breath sounds.  Lymphadenopathy:     Cervical: No cervical adenopathy.  Skin:    General: Skin is warm.     Findings: Ecchymosis present. No erythema or rash.       Neurological:     General: No focal deficit present.     Mental Status: He is oriented to person, place, and time.  Psychiatric:        Mood and Affect: Affect normal. Mood is anxious.     ASSESSMENT AND PLAN:  Raymond Lutz was seen today for bruising. Lab Results  Component Value Date   WBC 4.2 06/20/2022   HGB 13.0 06/20/2022   HCT 39.6 06/20/2022   MCV 83.0 06/20/2022   PLT 217.0 06/20/2022   Lab Results  Component Value Date   CREATININE 0.76 06/20/2022   BUN 17 06/20/2022   NA 140 06/20/2022   K 3.8 06/20/2022   CL 108 06/20/2022   CO2 25 06/20/2022   Hematoma Left arm, it is improving. Monitor for new symptoms. Reassured but he would like blood work today. He has an appt with PCP in a few days. Instructed about warning signs.  -     Basic metabolic panel; Future -     CBC; Future  Chronic anticoagulation We discussed some side effects of Eliquis. Instructed to avoid NSAID's. If any pain in the future just Acetaminophen.  -     Basic metabolic panel; Future -     CBC; Future  Atrial fibrillation, unspecified type Rhythm and rate controlled. CHA2DS2/VAS =3 He is on Eliquis 5 mg bid. Follow with cardiologist.  Return if symptoms worsen or fail  to improve.  Key Cen G. Swaz[place], MD  [facility]. [place] office.

## 2022-06-20 NOTE — Patient Instructions (Addendum)
A few things to remember from today's visit:  Hematoma  Chronic anticoagulation You can apply local ice for 24-48 hours. Monitor for new symptoms.  If you need refills for medications you take chronically, please call your pharmacy. Do not use My Chart to request refills or for acute issues that need immediate attention. If you send a my chart message, it may take a few days to be addressed, specially if I am not in the office.  Please be sure medication list is accurate. If a new problem present, please set up appointment sooner than planned today.

## 2022-06-22 ENCOUNTER — Encounter: Payer: Self-pay | Admitting: Family Medicine

## 2022-06-22 ENCOUNTER — Ambulatory Visit: Payer: BC Managed Care – PPO | Admitting: Family Medicine

## 2022-06-22 VITALS — BP 142/90 | HR 56 | Temp 97.9°F | Wt 399.0 lb

## 2022-06-22 DIAGNOSIS — N452 Orchitis: Secondary | ICD-10-CM

## 2022-06-22 DIAGNOSIS — N401 Enlarged prostate with lower urinary tract symptoms: Secondary | ICD-10-CM

## 2022-06-22 DIAGNOSIS — N138 Other obstructive and reflux uropathy: Secondary | ICD-10-CM | POA: Diagnosis not present

## 2022-06-22 DIAGNOSIS — E118 Type 2 diabetes mellitus with unspecified complications: Secondary | ICD-10-CM

## 2022-06-22 DIAGNOSIS — L84 Corns and callosities: Secondary | ICD-10-CM | POA: Diagnosis not present

## 2022-06-22 DIAGNOSIS — Z0000 Encounter for general adult medical examination without abnormal findings: Secondary | ICD-10-CM | POA: Diagnosis not present

## 2022-06-22 LAB — HEPATIC FUNCTION PANEL
ALT: 28 U/L (ref 0–53)
AST: 22 U/L (ref 0–37)
Albumin: 4.3 g/dL (ref 3.5–5.2)
Alkaline Phosphatase: 63 U/L (ref 39–117)
Bilirubin, Direct: 0.1 mg/dL (ref 0.0–0.3)
Total Bilirubin: 0.4 mg/dL (ref 0.2–1.2)
Total Protein: 6.7 g/dL (ref 6.0–8.3)

## 2022-06-22 LAB — LIPID PANEL
Cholesterol: 111 mg/dL (ref 0–200)
HDL: 37.6 mg/dL — ABNORMAL LOW (ref >39.00)
LDL Cholesterol: 63 mg/dL (ref 0–99)
NonHDL: 73.1
Total CHOL/HDL Ratio: 3
Triglycerides: 49 mg/dL (ref 0.0–149.0)
VLDL: 9.8 mg/dL (ref 0.0–40.0)

## 2022-06-22 LAB — HEMOGLOBIN A1C: Hgb A1c MFr Bld: 6.5 % (ref 4.6–6.5)

## 2022-06-22 LAB — TSH: TSH: 1.67 u[IU]/mL (ref 0.35–5.50)

## 2022-06-22 LAB — PSA: PSA: 0.18 ng/mL (ref 0.10–4.00)

## 2022-06-22 MED ORDER — DOXYCYCLINE HYCLATE 100 MG PO CAPS: 100.0000 mg | ORAL_CAPSULE | Freq: Two times a day (BID) | ORAL | 0 refills | Status: DC

## 2022-06-22 MED ORDER — METFORMIN HCL ER 500 MG PO TB24: 500.0000 mg | ORAL_TABLET | Freq: Every day | ORAL | 3 refills | Status: DC

## 2022-06-22 NOTE — Addendum Note (Signed)
Addended by: Gershon Crane A on: 06/22/2022 10:31 AM   Modules accepted: Orders

## 2022-06-22 NOTE — Progress Notes (Signed)
   Subjective:    Patient ID: Raymond Lutz, male    DOB: [birth year], 57 y.o.   MRN: [id number]  HPI Here for several issues. First he developed a painful lesion on the right 5th toe about 2 weeks ago. He has been wearing a protective pad over it. Second he developed swelling and pain in the right testicle 5 days ago. No fever or recent trauma. No urinary symptoms. He also asks to be referred for a colonoscopy.    Review of Systems  Constitutional: Negative.   Respiratory: Negative.    Cardiovascular: Negative.   Genitourinary:  Positive for scrotal swelling and testicular pain. Negative for hematuria and penile discharge.       Objective:   Physical Exam Constitutional:      Comments: In pain   Cardiovascular:     Rate and Rhythm: Normal rate and regular rhythm.     Pulses: Normal pulses.     Heart sounds: Normal heart sounds.  Pulmonary:     Effort: Pulmonary effort is normal.     Breath sounds: Normal breath sounds.  Genitourinary:    Penis: Normal.      Comments: The right testicle is tender and mildly swollen. No hernia is felt.  Skin:    Comments: There is a hard callus on the lateral right 5th toe   Neurological:     Mental Status: He is alert.           Assessment & Plan:  He has an orchitis, and we will treat with 14 days of Doxycycline. For the toe corn, refer to Podiatry.  Gershon Crane, MD

## 2022-06-26 ENCOUNTER — Encounter: Payer: Self-pay | Admitting: Physician Assistant

## 2022-07-06 ENCOUNTER — Ambulatory Visit: Payer: BC Managed Care – PPO | Admitting: Podiatry

## 2022-07-06 DIAGNOSIS — M205X1 Other deformities of toe(s) (acquired), right foot: Secondary | ICD-10-CM

## 2022-07-06 DIAGNOSIS — E1142 Type 2 diabetes mellitus with diabetic polyneuropathy: Secondary | ICD-10-CM | POA: Diagnosis not present

## 2022-07-06 DIAGNOSIS — M79674 Pain in right toe(s): Secondary | ICD-10-CM | POA: Diagnosis not present

## 2022-07-06 DIAGNOSIS — L84 Corns and callosities: Secondary | ICD-10-CM

## 2022-07-06 DIAGNOSIS — B351 Tinea unguium: Secondary | ICD-10-CM

## 2022-07-06 DIAGNOSIS — Z7984 Long term (current) use of oral hypoglycemic drugs: Secondary | ICD-10-CM

## 2022-07-06 DIAGNOSIS — M79675 Pain in left toe(s): Secondary | ICD-10-CM | POA: Diagnosis not present

## 2022-07-06 DIAGNOSIS — E119 Type 2 diabetes mellitus without complications: Secondary | ICD-10-CM

## 2022-07-06 NOTE — Progress Notes (Signed)
  Subjective:  Patient ID: Raymond Lutz, male    DOB: 03-31-1965,  MRN: [id number]  Chief Complaint  Patient presents with   Callouses    Corn on right 5th toe. Diabetic. A1C-6.5    57 y.o. male presents with the above complaint. History confirmed with patient. Patient presenting primarily for corn on the right fifth toe as well as with pain related to dystrophic thickened elongated nails. Patient is unable to trim own nails related to nail dystrophy and/or mobility issues. Patient does have a history of T2DM. Patient does have callus present located at the right lateral dorsal aspect of the fifth toe causing pain.   Objective:  Physical Exam: warm, good capillary refill nail exam onychomycosis of the toenails, onycholysis, and dystrophic nails DP pulses palpable, PT pulses palpable, and protective sensation absent Left Foot:  Pain with palpation of nails due to elongation and dystrophic growth.  Right Foot: Pain with palpation of nails due to elongation and dystrophic growth.  Adductovarus rotation of the fifth toe on the right foot  Assessment:   1. Corn of toe   2. Adductovarus rotation of toe, acquired, right   3. Pain due to onychomycosis of toenails of both feet   4. DM type 2 with diabetic peripheral neuropathy   5. Diabetes mellitus treated with oral medication      Plan:  Patient was evaluated and treated and all questions answered.  #DM2 with neuropathy Patient educated on diabetes. Discussed proper diabetic foot care and discussed risks and complications of disease. Educated patient in depth on reasons to return to the office immediately should he/she discover anything concerning or new on the feet. All questions answered. Discussed proper shoes as well.   #Hyperkeratotic lesions/pre ulcerative calluses present dorsal lateral aspect right fifth toe secondary to adductovarus rotation of the toe All symptomatic hyperkeratoses x 1 separate lesions were safely debrided  with a sterile #10 blade to patient's level of comfort without incident. We discussed preventative and palliative care of these lesions including supportive and accommodative shoegear, padding, prefabricated and custom molded accommodative orthoses, use of a pumice stone and lotions/creams daily. -Recommend use of gel toe cap over the right fifth toe  #Onychomycosis with pain  -Nails palliatively debrided as below. -Educated on self-care  Procedure: Nail Debridement Rationale: Pain Type of Debridement: manual, sharp debridement. Instrumentation: Nail nipper, rotary burr. Number of Nails: 10  Return in about 3 months (around 10/05/2022) for [facility].         Corinna Gab, DPM [facility] / [facility]

## 2022-07-20 ENCOUNTER — Other Ambulatory Visit: Payer: Self-pay | Admitting: Cardiology

## 2022-08-22 NOTE — Progress Notes (Unsigned)
08/24/2022 Raymond Lutz [id number] Jun 07, 1965  Referring provider: Nelwyn Salisbury, MD Primary GI doctor: Dr. Adela Lutz  ASSESSMENT AND PLAN:   History of diverticulitis 2022, has never have colonoscopy No anemia, no family history of colon cancer Will schedule colonoscopy at the hospital to evaluate for malignancy/polyps Patient will be scheduled at the hospital for their procedure due to ASA IV criteria: BMI over 50 We have discussed the risks of bleeding, infection, perforation, medication reactions, and remote risk of death associated with colonoscopy. All questions were answered and the patient acknowledges these risk and wishes to proceed.  Fatty liver see on CT 2022 - FIB4 1.06, low risk for advanced fibrosis --Continue to work on risk factor modification including diet exercise and control of risk factors including blood sugars. - monitor q 6 months.   Hiatal hernia No symptoms of GERD at this time Lifestyle changes discussed, avoid NSAIDS, ETOH Weight loss discussed  Morbid obesity  Body mass index is 52.11 kg/m.  -Patient has been advised to make an attempt to improve diet and exercise patterns to aid in weight loss. -Recommended diet heavy in fruits and veggies and low in animal meats, cheeses, and dairy products, appropriate calorie intake  Atrial fibrillation, unspecified type [facility]) Patient told to hold his Eliquis for 2 days prior to time of procedure. Will instruct when and how to resume after procedure. We will communicate with his prescribing physician Raymond Lutz to ensure that holding his Eliquis is acceptable. We discussed the risk, benefits and alternatives to colonoscopy/endoscopy.  We also discussed the low but real risk of cardiovascular event such as heart attack, stroke, embolism, thrombosis or ischemia/infarct of other organs off Eliquis and explained the need to seek urgent help if this occurs.  He is agreeable and wishes to  proceed.  Controlled type 2 diabetes mellitus with complication, without long-term current use of insulin (HCC) Will need to hold glipizide   Patient Care Team: Raymond Salisbury, MD as PCP - General (Family Medicine) Raymond Reichert, MD as PCP - Cardiology (Cardiology)  HISTORY OF PRESENT ILLNESS: 57 y.o. male referred by Raymond Salisbury, MD presents for consultation of colonoscopy while on blood thinner. He has past medical history significant for CAD status post PCI of the distal LAD and OM 2 in 2017, atrial fibrillation, morbid obesity BMI 52, obstructive sleep apnea on CPAP, diverticulitis and others listed below,  Patient is on Eliquis for atrial fibrillation.  Follows with Raymond Lutz, most recent office visit 05/18/2022.  11/17/2021 echo EF 55 to 60% no aortic stenosis. Denies any chest pain or shortness of breath.   Patient never had a colonoscopy. Patient had episode of diverticulitis 04/26/2020 treated with Augmentin. CT abdomen pelvis showed pericolonic fat stranding descending colon multiple colonic diverticula favor diverticulitis, possible epiploic appendagitis.  No complications.  Small volume free fluid in the pelvis reactive.  Hepatomegaly with mild steatosis, small hiatal hernia, small fat-containing umbilical hernia and aortic atherosclerosis.  Patient denies  family history of colon cancer or other gastrointestinal malignancies. He is doing keto to try to lose weight, has increase meat/cheese, has worsened constipation with hard stools. Still has BM daily but has harder.  Patient denies diarrhea, hematochezia.  Denies changes in appetite, unintentional weight loss.  Patient does complain of GERD occ with eating spicy foods.   Patient denies dysphagia, nausea, vomiting, melena.   CBC on 06/20/2022  WBC 4.2 HGB 13.0 MCV 83.0 Platelets 217.0  He reports NSAID  use rare once a week for knee pain, aleve 1 pill. He denies ETOH use.   He denies tobacco use.  He denies drug use.     He  reports that he has never smoked. He has never used smokeless tobacco. He reports that he does not currently use alcohol. He reports that he does not use drugs.  RELEVANT LABS AND IMAGING: CBC    Component Value Date/Time   WBC 4.2 06/20/2022 1141   RBC 4.77 06/20/2022 1141   HGB 13.0 06/20/2022 1141   HGB 13.8 02/07/2014 1422   HCT 39.6 06/20/2022 1141   HCT 43.4 02/07/2014 1422   PLT 217.0 06/20/2022 1141   PLT 285 02/07/2014 1422   MCV 83.0 06/20/2022 1141   MCV 84 02/07/2014 1422   MCH 27.0 11/24/2021 1350   MCHC 32.8 06/20/2022 1141   RDW 14.9 06/20/2022 1141   RDW 14.8 (H) 02/07/2014 1422   LYMPHSABS 1.8 01/05/2022 1021   MONOABS 0.5 01/05/2022 1021   EOSABS 0.3 01/05/2022 1021   BASOSABS 0.0 01/05/2022 1021   Recent Labs    11/24/21 1350 01/05/22 1021 06/20/22 1141  HGB 13.1 13.9 13.0    CMP     Component Value Date/Time   NA 140 06/20/2022 1141   NA 142 11/02/2021 0945   NA 140 02/07/2014 1422   K 3.8 06/20/2022 1141   K 3.6 02/07/2014 1422   CL 108 06/20/2022 1141   CL 105 02/07/2014 1422   CO2 25 06/20/2022 1141   CO2 26 02/07/2014 1422   GLUCOSE 104 (H) 06/20/2022 1141   GLUCOSE 99 02/07/2014 1422   BUN 17 06/20/2022 1141   BUN 13 11/02/2021 0945   BUN 15 02/07/2014 1422   CREATININE 0.76 06/20/2022 1141   CREATININE 0.97 02/01/2016 1126   CALCIUM 9.0 06/20/2022 1141   CALCIUM 9.0 02/07/2014 1422   PROT 6.7 06/22/2022 1031   PROT 7.3 11/02/2021 0945   ALBUMIN 4.3 06/22/2022 1031   ALBUMIN 4.7 11/02/2021 0945   AST 22 06/22/2022 1031   ALT 28 06/22/2022 1031   ALKPHOS 63 06/22/2022 1031   BILITOT 0.4 06/22/2022 1031   BILITOT 0.6 11/02/2021 0945   GFRNONAA >60 11/24/2021 1350   GFRNONAA >60 02/07/2014 1422   GFRAA >60 11/14/2019 1348   GFRAA >60 02/07/2014 1422      Latest Ref Rng & Units 06/22/2022   10:31 AM 01/05/2022   10:21 AM 11/02/2021    9:45 AM  Hepatic Function  Total Protein 6.0 - 8.3 g/dL 6.7  7.1  7.3   Albumin  3.5 - 5.2 g/dL 4.3  4.5  4.7   AST 0 - 37 U/L 22  18  19    ALT 0 - 53 U/L 28  20  21    Alk Phosphatase 39 - 117 U/L 63  55  74   Total Bilirubin 0.2 - 1.2 mg/dL 0.4  0.5  0.6   Bilirubin, Direct 0.0 - 0.3 mg/dL 0.1  0.1        Current Medications:   Current Outpatient Medications (Endocrine & Metabolic):    glipiZIDE (GLUCOTROL) 5 MG tablet, Take 1 tablet (5 mg total) by mouth 2 (two) times daily before a meal.   metFORMIN (GLUCOPHAGE-XR) 500 MG 24 hr tablet, Take 1 tablet (500 mg total) by mouth daily with breakfast.  Current Outpatient Medications (Cardiovascular):    amLODipine (NORVASC) 10 MG tablet, Take 1 tablet by mouth once daily   atorvastatin (LIPITOR) 80 MG tablet,  Take 1 tablet by mouth once daily   ezetimibe (ZETIA) 10 MG tablet, Take 1 tablet by mouth once daily   furosemide (LASIX) 40 MG tablet, Take 1 tablet by mouth twice daily   isosorbide mononitrate (IMDUR) 30 MG 24 hr tablet, Take 1 tablet by mouth once daily   losartan (COZAAR) 100 MG tablet, Take 1 tablet by mouth once daily   metoprolol tartrate (LOPRESSOR) 50 MG tablet, Take 1 tablet (50 mg total) by mouth 2 (two) times daily.   nitroGLYCERIN (NITROSTAT) 0.4 MG SL tablet, DISSOLVE ONE TABLET UNDER THE TONGUE EVERY 5 MINUTES AS NEEDED FOR CHEST PAIN.  DO NOT EXCEED A TOTAL OF 3 DOSES IN 15 MINUTES   tadalafil (CIALIS) 20 MG tablet, Take 1 tablet (20 mg total) by mouth as needed for erectile dysfunction.  Current Outpatient Medications (Respiratory):    albuterol (VENTOLIN HFA) 108 (90 Base) MCG/ACT inhaler, INHALE 2 PUFFS BY MOUTH INTO THE LUNGS EVERY 4 HOURS AS NEEDED   budesonide (PULMICORT) 1 MG/2ML nebulizer solution, Take 1 mg by nebulization as needed (shortness of breath or wheezing).   fluticasone (FLONASE) 50 MCG/ACT nasal spray, Place 1 spray into both nostrils daily. (Patient taking differently: Place 1 spray into both nostrils daily as needed for allergies.)  Current Outpatient Medications  (Analgesics):    aspirin EC 81 MG tablet, Take 1 tablet (81 mg total) by mouth daily.  Current Outpatient Medications (Hematological):    apixaban (ELIQUIS) 5 MG TABS tablet, Take 1 tablet (5 mg total) by mouth 2 (two) times daily.  Current Outpatient Medications (Other):    diclofenac Sodium (VOLTAREN) 1 % GEL, Apply 2 g topically daily as needed (pain).   FREESTYLE LITE test strip, USE 1 STRIP TO CHECK GLUCOSE ONCE DAILY   LORazepam (ATIVAN) 0.5 MG tablet, Take 1 tablet (0.5 mg total) by mouth every 8 (eight) hours as needed for anxiety.   NON FORMULARY, Pt uses a c pap nightly   Potassium Chloride ER 20 MEQ TBCR, Take 40 mg by mouth every morning. TAKE 2 TABLETS BY MOUTH IN THE MORNING   scopolamine (TRANSDERM-SCOP) 1 MG/3DAYS, 1 patch as needed. 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 2013: nonobstructive CAD with 50% OM, 20-30% dLAD, 30-40%dRCA  b. 12/2015: NSTEMI w/ 99% stenosis dLAD(DES placed), 99% 2nd Mrg (DES placed), apical LAD stenosis too small for PCI and mild disease in the RCA. c. 11/2019: NSTEMI showed 90% mid LAD with widely patent distal LAD OM2 and mid left circumflex stents, 30% mid to distal RCA and 20% proximal RCA  s/p PCI of the mid LAD.   DIABETES MELLITUS, TYPE II 10/17/2006   ED (erectile dysfunction)    GERD (gastroesophageal reflux disease)    History of gout    History of kidney stones    HYPERLIPIDEMIA 10/17/2006   HYPERTENSION 10/17/2006   OBESITY 10/17/2006   OSA on CPAP    not using CPAP regularly"need a new one" (02/21/2018)   Paroxysmal atrial fibrillation (HCC)    2006 - was on coumadin - took himself off 6 mos after initiation.   Shortness of breath    Allergies: No Known Allergies   Surgical History:  He  has a past surgical history that includes left heart catheterization with coronary angiogram (N/A,  05/03/2011); Knee arthroscopy (Left); Inguinal hernia  repair (Right); Cardiac catheterization (N/A, 12/17/2015); Cardiac catheterization (N/A, 12/17/2015); Coronary angioplasty with stent (02/21/2018); Skin graft (Left, 1986); LEFT HEART CATH AND CORONARY ANGIOGRAPHY (N/A, 02/21/2018); CORONARY STENT INTERVENTION (N/A, 02/21/2018); LEFT HEART CATH AND CORONARY ANGIOGRAPHY (N/A, 11/13/2019); and Cardioversion (N/A, 12/08/2021). Family History:  His family history includes Cerebral aneurysm in his mother; Diabetes in his maternal grandmother; Heart attack in his brother and sister; Hypertension in his brother; Lung cancer in his father.  REVIEW OF SYSTEMS  : All other systems reviewed and negative except where noted in the History of Present Illness.  PHYSICAL EXAM: BP (!) 140/80   Pulse 75   Ht 6\' 1"  (1.854 m)   Wt (!) 395 lb (179.2 kg)   BMI 52.11 kg/m  General Appearance: Morbidly obese, in no apparent distress. Head:   Normocephalic and atraumatic. Eyes:  sclerae anicteric,conjunctive pink  Respiratory: Respiratory effort normal, BS equal bilaterally without rales, rhonchi, wheezing. Cardio: RRR with no MRGs. Peripheral pulses intact.  Abdomen: Soft,  Obese ,active bowel sounds. mild tenderness in the RLQ. Without guarding and Without rebound. No masses. Rectal: Not evaluated Musculoskeletal: Full ROM, Antalgic gait. With edema bilateral and stasis dermatitis. Skin:  Dry and intact without significant lesions or rashes Neuro: Alert and  oriented x4;  No focal deficits. Psych:  Cooperative. Normal mood and affect.    Doree Albee, PA-C 9:57 AM

## 2022-08-24 ENCOUNTER — Ambulatory Visit: Payer: BC Managed Care – PPO | Admitting: Physician Assistant

## 2022-08-24 ENCOUNTER — Telehealth: Payer: Self-pay | Admitting: *Deleted

## 2022-08-24 ENCOUNTER — Encounter: Payer: Self-pay | Admitting: Physician Assistant

## 2022-08-24 VITALS — BP 130/80 | HR 75 | Ht 73.0 in | Wt 395.0 lb

## 2022-08-24 DIAGNOSIS — Z8719 Personal history of other diseases of the digestive system: Secondary | ICD-10-CM | POA: Diagnosis not present

## 2022-08-24 DIAGNOSIS — K760 Fatty (change of) liver, not elsewhere classified: Secondary | ICD-10-CM | POA: Diagnosis not present

## 2022-08-24 DIAGNOSIS — Z1211 Encounter for screening for malignant neoplasm of colon: Secondary | ICD-10-CM | POA: Diagnosis not present

## 2022-08-24 DIAGNOSIS — K449 Diaphragmatic hernia without obstruction or gangrene: Secondary | ICD-10-CM

## 2022-08-24 DIAGNOSIS — E118 Type 2 diabetes mellitus with unspecified complications: Secondary | ICD-10-CM

## 2022-08-24 DIAGNOSIS — I4891 Unspecified atrial fibrillation: Secondary | ICD-10-CM

## 2022-08-24 MED ORDER — PLENVU 140 G PO SOLR: 1.0000 | Freq: Once | ORAL | 0 refills | Status: AC

## 2022-08-24 NOTE — Telephone Encounter (Signed)
[facility] Pre-operative Risk Assessment     Request for surgical clearance:     Endoscopy Procedure  Colonoscopy  What type of surgery is being performed?     Colonoscopy  When is this surgery scheduled?     TBD at [facility]  What type of clearance is required ?   Pharmacy  Are there any medications that need to be held prior to surgery and how long? Eliquis  Practice name and name of physician performing surgery?      [facility]  Oliva Bustard  What is your office phone and fax number?      Phone- [phone]  Fax- [phone]  Anesthesia type (None, local, MAC, general) ?       MAC

## 2022-08-24 NOTE — Patient Instructions (Addendum)
Miralax is an osmotic laxative.  It only brings more water into the stool.  This is safe to take daily.  Can take up to 17 gram of miralax twice a day.  Mix with juice or coffee.  Start 1 capful at night for 3-4 days and reassess your response in 3-4 days.  You can increase and decrease the dose based on your response.  Remember, it can take up to 3-4 days to take effect OR for the effects to wear off.   I often pair this with benefiber in the morning to help assure the stool is not too loose.    Metabolic dysfunction associated seatohepatitis  Now the leading cause of liver failure in the [place].  It is normally from such risk factors as obesity, diabetes, insulin resistance, high cholesterol, or metabolic syndrome.  The only definitive therapy is weight loss and exercise.   Suggest walking 20-30 mins daily.  Decreasing carbohydrates, increasing veggies.    Fatty Liver Fatty liver is the accumulation of fat in liver cells. It is also called hepatosteatosis or steatohepatitis. It is normal for your liver to contain some fat. If fat is more than 5 to 10% of your liver's weight, you have fatty liver.  There are often no symptoms (problems) for years while damage is still occurring. People often learn about their fatty liver when they have medical tests for other reasons. Fat can damage your liver for years or even decades without causing problems. When it becomes severe, it can cause fatigue, weight loss, weakness, and confusion. This makes you more likely to develop more serious liver problems. The liver is the largest organ in the body. It does a lot of work and often gives no warning signs when it is sick until late in a disease. The liver has many important jobs including: Breaking down foods. Storing vitamins, iron, and other minerals. Making proteins. Making bile for food digestion. Breaking down many products including medications, alcohol and some poisons.  PROGNOSIS   Fatty liver may cause no damage or it can lead to an inflammation of the liver. This is, called steatohepatitis.  Over time the liver may become scarred and hardened. This condition is called cirrhosis. Cirrhosis is serious and may lead to liver failure or cancer. NASH is one of the leading causes of cirrhosis. About 10-20% of Americans have fatty liver and a smaller 2-5% has NASH.  TREATMENT  Weight loss, fat restriction, and exercise in overweight patients produces inconsistent results but is worth trying. Good control of diabetes may reduce fatty liver. Eat a balanced, healthy diet. Increase your physical activity. There are no medical or surgical treatments for a fatty liver or NASH, but improving your diet and increasing your exercise may help prevent or reverse some of the damage.  Diverticulosis Diverticulosis is a condition that develops when small pouches (diverticula) form in the wall of the large intestine (colon). The colon is where water is absorbed and stool (feces) is formed. The pouches form when the inside layer of the colon pushes through weak spots in the outer layers of the colon. You may have a few pouches or many of them. The pouches usually do not cause problems unless they become inflamed or infected. When this happens, the condition is called diverticulitis- this is left lower quadrant pain, diarrhea, fever, chills, nausea or vomiting.  If this occurs please call the office or go to the hospital. Sometimes these patches without inflammation can also have painless bleeding  associated with them, if this happens please call the office or go to the hospital. Preventing constipation and increasing fiber can help reduce diverticula and prevent complications. Even if you feel you have a high-fiber diet, suggest getting on Benefiber or Cirtracel 2 times daily.  Hiatal Hernia  A hiatal hernia occurs when part of the stomach slides above the muscle that separates the abdomen from  the chest (diaphragm). A person can be born with a hiatal hernia (congenital), or it may develop over time. In almost all cases of hiatal hernia, only the top part of the stomach pushes through the diaphragm. Many people have a hiatal hernia with no symptoms. The larger the hernia, the more likely it is that you will have symptoms. In some cases, a hiatal hernia allows stomach acid to flow back into the tube that carries food from your mouth to your stomach (esophagus). This may cause heartburn symptoms. The development of heartburn symptoms may mean that you have a condition called gastroesophageal reflux disease (GERD). What are the causes? This condition is caused by a weakness in the opening (hiatus) where the esophagus passes through the diaphragm to attach to the upper part of the stomach. A person may be born with a weakness in the hiatus, or a weakness can develop over time. What increases the risk? This condition is more likely to develop in: Older people. Age is a major risk factor for a hiatal hernia, especially if you are over the age of 75. Pregnant women. People who are overweight. People who have frequent constipation. What are the signs or symptoms? Symptoms of this condition usually develop in the form of GERD symptoms. Symptoms include: Heartburn. Upset stomach (indigestion). Trouble swallowing. Coughing or wheezing. Wheezing is making high-pitched whistling sounds when you breathe. Sore throat. Chest pain. Nausea and vomiting. How is this diagnosed? This condition may be diagnosed during testing for GERD. Tests that may be done include: X-rays of your stomach or chest. An upper gastrointestinal (GI) series. This is an X-ray exam of your GI tract that is taken after you swallow a chalky liquid that shows up clearly on the X-ray. Endoscopy. This is a procedure to look into your stomach using a thin, flexible tube that has a tiny camera and light on the end of it. How is this  treated? This condition may be treated by: Dietary and lifestyle changes to help reduce GERD symptoms. Medicines. These may include: Over-the-counter antacids. Medicines that make your stomach empty more quickly. Medicines that block the production of stomach acid (H2 blockers). Stronger medicines to reduce stomach acid (proton pump inhibitors). Surgery to repair the hernia, if other treatments are not helping. If you have no symptoms, you may not need treatment. Follow these instructions at home: Lifestyle and activity Do not use any products that contain nicotine or tobacco. These products include cigarettes, chewing tobacco, and vaping devices, such as e-cigarettes. If you need help quitting, ask your health care provider. Try to achieve and maintain a healthy body weight. Avoid putting pressure on your abdomen. Anything that puts pressure on your abdomen increases the amount of acid that may be pushed up into your esophagus. Avoid bending over, especially after eating. Raise the head of your bed by putting blocks under the legs. This keeps your head and esophagus higher than your stomach. Do not wear tight clothing around your chest or stomach. Try not to strain when having a bowel movement, when urinating, or when lifting heavy objects. Eating and  drinking Avoid foods that can worsen GERD symptoms. These may include: Fatty foods, like fried foods. Citrus fruits, like oranges or lemon. Other foods and drinks that contain acid, like orange juice or tomatoes. Spicy food. Chocolate. Eat frequent small meals instead of three large meals a day. This helps prevent your stomach from getting too full. Eat slowly. Do not lie down right after eating. Do not eat 1-2 hours before bed. Do not drink beverages with caffeine. These include cola, coffee, cocoa, and tea. Do not drink alcohol. General instructions Take over-the-counter and prescription medicines only as told by your health care  provider. Keep all follow-up visits. Your health care provider will want to check that any new prescribed medicines are helping your symptoms. Contact a health care provider if: Your symptoms are not controlled with medicines or lifestyle changes. You are having trouble swallowing. You have coughing or wheezing that will not go away. Your pain is getting worse. Your pain spreads to your arms, neck, jaw, teeth, or back. You feel nauseous or you vomit. Get help right away if: You have shortness of breath. You vomit blood. You have bright red blood in your stools. You have black, tarry stools. These symptoms may be an emergency. Get help right away. Call 911. Do not wait to see if the symptoms will go away. Do not drive yourself to the hospital. Summary A hiatal hernia occurs when part of the stomach slides above the muscle that separates the abdomen from the chest. A person may be born with a weakness in the hiatus, or a weakness can develop over time. Symptoms of a hiatal hernia may include heartburn, trouble swallowing, or sore throat. Management of a hiatal hernia includes eating frequent small meals instead of three large meals a day. Get help right away if you vomit blood, have bright red blood in your stools, or have black, tarry stools. This information is not intended to replace advice given to you by your health care provider. Make sure you discuss any questions you have with your health care provider. Document Revised: 04/26/2021 Document Reviewed: 04/26/2021 Elsevier Patient Education  2024 [employer].   We will contact you with the date and time of your procedure

## 2022-08-24 NOTE — Addendum Note (Signed)
Addended by: Marlowe Kays on: 08/24/2022 01:08 PM   Modules accepted: Orders

## 2022-08-24 NOTE — Progress Notes (Signed)
Agree with assessment and plan as outlined.  

## 2022-08-24 NOTE — Addendum Note (Signed)
Addended by: Marlowe Kays on: 08/24/2022 01:58 PM   Modules accepted: Orders

## 2022-08-25 NOTE — Telephone Encounter (Signed)
Primary Cardiologist:Traci Turner, MD  Chart reviewed as part of pre-operative protocol coverage. Because of Jhovanni Policarpio Krage's past medical history and time since last visit, he/she will require a follow-up visit in order to better assess preoperative cardiovascular risk.  Pre-op covering staff: - Patient has an appointment with Dr. Mayford Knife on 11/16/22, procedure date is 9/12. Clearance can be addressed at that time. Appointment notes have been updated. - Please contact requesting surgeon's office via preferred method (i.e, phone, fax) to inform them of need for appointment prior to surgery.  Per office protocol, patient can hold Eliquis for 1-2 days prior to procedure.   Levi Aland, NP-C  08/25/2022, 1:53 PM [address] Office [phone] Fax [phone]

## 2022-08-25 NOTE — Telephone Encounter (Signed)
I left a message for the patient to call our office to schedule a in office visit for pre-op clearance.  

## 2022-08-25 NOTE — Telephone Encounter (Signed)
Patient with diagnosis of afib on Eliquis for anticoagulation.    Procedure: colonoscopy Date of procedure: 11/23/22  CHA2DS2-VASc Score = 3  This indicates a 3.2% annual risk of stroke. The patient's score is based upon: CHF History: 0 HTN History: 1 Diabetes History: 1 Stroke History: 0 Vascular Disease History: 1 Age Score: 0 Gender Score: 0   CrCl >169mL/min Platelet count 217K  Per office protocol, patient can hold Eliquis for 1-2 days prior to procedure.    **This guidance is not considered finalized until pre-operative APP has relayed final recommendations.**

## 2022-08-28 NOTE — Telephone Encounter (Signed)
Thank you :)

## 2022-08-28 NOTE — Telephone Encounter (Signed)
Called the pt in error. Pt has appt already with Dr. Mayford Knife 11/16/22 , pre op APP pre op can be addressed at that appt. I will update the requesting office.

## 2022-10-05 ENCOUNTER — Ambulatory Visit: Payer: BC Managed Care – PPO | Admitting: Family Medicine

## 2022-10-05 ENCOUNTER — Ambulatory Visit: Payer: BC Managed Care – PPO | Admitting: Podiatry

## 2022-10-19 ENCOUNTER — Other Ambulatory Visit: Payer: Self-pay | Admitting: Cardiology

## 2022-10-19 ENCOUNTER — Other Ambulatory Visit: Payer: Self-pay | Admitting: Physician Assistant

## 2022-10-19 ENCOUNTER — Ambulatory Visit: Payer: BC Managed Care – PPO | Admitting: Family Medicine

## 2022-11-02 ENCOUNTER — Other Ambulatory Visit: Payer: Self-pay | Admitting: Cardiology

## 2022-11-02 DIAGNOSIS — I480 Paroxysmal atrial fibrillation: Secondary | ICD-10-CM

## 2022-11-02 DIAGNOSIS — I10 Essential (primary) hypertension: Secondary | ICD-10-CM

## 2022-11-02 DIAGNOSIS — Z760 Encounter for issue of repeat prescription: Secondary | ICD-10-CM

## 2022-11-02 NOTE — Telephone Encounter (Signed)
Prescription refill request for Eliquis received. Indication: Afib  Last office visit: 05/18/22 Asa Lente)  Scr: 0.76 (06/20/22)  Age: 57 Weight: 179.2kg  Appropriate dose. Refill sent.

## 2022-11-09 ENCOUNTER — Encounter: Payer: Self-pay | Admitting: Gastroenterology

## 2022-11-09 ENCOUNTER — Ambulatory Visit: Payer: BC Managed Care – PPO | Attending: Physician Assistant

## 2022-11-09 DIAGNOSIS — E785 Hyperlipidemia, unspecified: Secondary | ICD-10-CM | POA: Diagnosis not present

## 2022-11-10 LAB — HEPATIC FUNCTION PANEL
ALT: 23 IU/L (ref 0–44)
AST: 22 IU/L (ref 0–40)
Albumin: 4.4 g/dL (ref 3.8–4.9)
Alkaline Phosphatase: 70 IU/L (ref 44–121)
Bilirubin Total: 0.2 mg/dL (ref 0.0–1.2)
Bilirubin, Direct: <0.1 mg/dL (ref 0.00–0.40)
Total Protein: 7.2 g/dL (ref 6.0–8.5)

## 2022-11-10 LAB — LIPID PANEL
Chol/HDL Ratio: 3.3 ratio (ref 0.0–5.0)
Cholesterol, Total: 130 mg/dL (ref 100–199)
HDL: 40 mg/dL (ref >39)
LDL Chol Calc (NIH): 80 mg/dL (ref 0–99)
Triglycerides: 40 mg/dL (ref 0–149)
VLDL Cholesterol Cal: 10 mg/dL (ref 5–40)

## 2022-11-13 ENCOUNTER — Other Ambulatory Visit: Payer: Self-pay | Admitting: Cardiology

## 2022-11-14 ENCOUNTER — Other Ambulatory Visit: Payer: Self-pay

## 2022-11-14 MED ORDER — ATORVASTATIN CALCIUM 80 MG PO TABS: 80.0000 mg | ORAL_TABLET | Freq: Every day | ORAL | 1 refills | Status: DC

## 2022-11-16 ENCOUNTER — Encounter: Payer: Self-pay | Admitting: Cardiology

## 2022-11-16 ENCOUNTER — Encounter (HOSPITAL_COMMUNITY): Payer: Self-pay | Admitting: Gastroenterology

## 2022-11-16 ENCOUNTER — Ambulatory Visit: Payer: BC Managed Care – PPO | Attending: Cardiology | Admitting: Cardiology

## 2022-11-16 VITALS — BP 144/90 | HR 57 | Ht 73.0 in | Wt 392.2 lb

## 2022-11-16 DIAGNOSIS — Z79899 Other long term (current) drug therapy: Secondary | ICD-10-CM

## 2022-11-16 DIAGNOSIS — I10 Essential (primary) hypertension: Secondary | ICD-10-CM | POA: Diagnosis not present

## 2022-11-16 DIAGNOSIS — I2510 Atherosclerotic heart disease of native coronary artery without angina pectoris: Secondary | ICD-10-CM

## 2022-11-16 DIAGNOSIS — M170 Bilateral primary osteoarthritis of knee: Secondary | ICD-10-CM | POA: Diagnosis not present

## 2022-11-16 DIAGNOSIS — E785 Hyperlipidemia, unspecified: Secondary | ICD-10-CM | POA: Diagnosis not present

## 2022-11-16 DIAGNOSIS — G4733 Obstructive sleep apnea (adult) (pediatric): Secondary | ICD-10-CM

## 2022-11-16 DIAGNOSIS — I480 Paroxysmal atrial fibrillation: Secondary | ICD-10-CM

## 2022-11-16 DIAGNOSIS — R600 Localized edema: Secondary | ICD-10-CM

## 2022-11-16 NOTE — Telephone Encounter (Signed)
Called patient and made him aware that we seen his doctors appt from cardiology from today I will also forward to Dr Lewanda Rife- Dr Adela Lank please see patients cardiology appointment from today 9/5 with Raymond Lutz cleared for colonoscopy at [facility] on 9/12. I told him to hold his ELiquis 2 days.

## 2022-11-16 NOTE — Telephone Encounter (Signed)
Patient cleared to Hold Eliquis 2 days before procedure See Cardiology note from today

## 2022-11-16 NOTE — Patient Instructions (Signed)
Medication Instructions:  Your physician recommends that you continue on your current medications as directed. Please refer to the Current Medication list given to you today.  *If you need a refill on your cardiac medications before your next appointment, please call your pharmacy*   Lab Work: Please make an appointment to have a FASTING lipid panel and an ALT drawn in our lab in 6 weeks.  If you have labs (blood work) drawn today and your tests are completely normal, you will receive your results only by: MyChart Message (if you have MyChart) OR A paper copy in the mail If you have any lab test that is abnormal or we need to change your treatment, we will call you to review the results.   Testing/Procedures: None.   Follow-Up: At [facility], you and your health needs are our priority.  As part of our continuing mission to provide you with exceptional heart care, we have created designated Provider Care Teams.  These Care Teams include your primary Cardiologist (physician) and Advanced Practice Providers (APPs -  Physician Assistants and Nurse Practitioners) who all work together to provide you with the care you need, when you need it.  We recommend signing up for the patient portal called "MyChart".  Sign up information is provided on this After Visit Summary.  MyChart is used to connect with patients for Virtual Visits (Telemedicine).  Patients are able to view lab/test results, encounter notes, upcoming appointments, etc.  Non-urgent messages can be sent to your provider as well.   To learn more about what you can do with MyChart, go to [email or web address].    Your next appointment:   1 year(s)  Provider:   Armanda Magic, MD
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
## 2022-11-16 NOTE — Telephone Encounter (Signed)
Thank you Robin, I appreciate the follow up.

## 2022-11-16 NOTE — Progress Notes (Signed)
Attempted to obtain medical history via telephone, unable to reach at this time. HIPAA compliant voicemail message left requesting return call to pre surgical testing department. 

## 2022-11-23 ENCOUNTER — Ambulatory Visit (HOSPITAL_COMMUNITY)
Admission: RE | Admit: 2022-11-23 | Discharge: 2022-11-23 | Disposition: A | Discharge status: Home / Self Care | Payer: BC Managed Care – PPO | Attending: Gastroenterology | Admitting: Gastroenterology

## 2022-11-23 ENCOUNTER — Other Ambulatory Visit: Payer: Self-pay

## 2022-11-23 ENCOUNTER — Ambulatory Visit (HOSPITAL_COMMUNITY): Payer: BC Managed Care – PPO | Admitting: Anesthesiology

## 2022-11-23 ENCOUNTER — Encounter (HOSPITAL_COMMUNITY)
Admission: RE | Disposition: A | Discharge status: Home / Self Care | Payer: Self-pay | Source: Home / Self Care | Attending: Gastroenterology

## 2022-11-23 ENCOUNTER — Encounter (HOSPITAL_COMMUNITY): Payer: Self-pay | Admitting: Gastroenterology

## 2022-11-23 DIAGNOSIS — Z7984 Long term (current) use of oral hypoglycemic drugs: Secondary | ICD-10-CM | POA: Insufficient documentation

## 2022-11-23 DIAGNOSIS — E119 Type 2 diabetes mellitus without complications: Secondary | ICD-10-CM | POA: Diagnosis not present

## 2022-11-23 DIAGNOSIS — I4891 Unspecified atrial fibrillation: Secondary | ICD-10-CM | POA: Diagnosis not present

## 2022-11-23 DIAGNOSIS — G4730 Sleep apnea, unspecified: Secondary | ICD-10-CM | POA: Insufficient documentation

## 2022-11-23 DIAGNOSIS — R0602 Shortness of breath: Secondary | ICD-10-CM | POA: Insufficient documentation

## 2022-11-23 DIAGNOSIS — K648 Other hemorrhoids: Secondary | ICD-10-CM | POA: Insufficient documentation

## 2022-11-23 DIAGNOSIS — Z7982 Long term (current) use of aspirin: Secondary | ICD-10-CM | POA: Insufficient documentation

## 2022-11-23 DIAGNOSIS — D126 Benign neoplasm of colon, unspecified: Secondary | ICD-10-CM

## 2022-11-23 DIAGNOSIS — J45909 Unspecified asthma, uncomplicated: Secondary | ICD-10-CM | POA: Insufficient documentation

## 2022-11-23 DIAGNOSIS — Z7901 Long term (current) use of anticoagulants: Secondary | ICD-10-CM | POA: Diagnosis not present

## 2022-11-23 DIAGNOSIS — Z6841 Body Mass Index (BMI) 40.0 and over, adult: Secondary | ICD-10-CM | POA: Diagnosis not present

## 2022-11-23 DIAGNOSIS — G4733 Obstructive sleep apnea (adult) (pediatric): Secondary | ICD-10-CM | POA: Insufficient documentation

## 2022-11-23 DIAGNOSIS — I2510 Atherosclerotic heart disease of native coronary artery without angina pectoris: Secondary | ICD-10-CM | POA: Diagnosis not present

## 2022-11-23 DIAGNOSIS — D128 Benign neoplasm of rectum: Secondary | ICD-10-CM | POA: Diagnosis not present

## 2022-11-23 DIAGNOSIS — Z1211 Encounter for screening for malignant neoplasm of colon: Secondary | ICD-10-CM | POA: Diagnosis not present

## 2022-11-23 DIAGNOSIS — Z955 Presence of coronary angioplasty implant and graft: Secondary | ICD-10-CM | POA: Insufficient documentation

## 2022-11-23 DIAGNOSIS — Z8719 Personal history of other diseases of the digestive system: Secondary | ICD-10-CM

## 2022-11-23 DIAGNOSIS — K5730 Diverticulosis of large intestine without perforation or abscess without bleeding: Secondary | ICD-10-CM | POA: Insufficient documentation

## 2022-11-23 DIAGNOSIS — I10 Essential (primary) hypertension: Secondary | ICD-10-CM | POA: Insufficient documentation

## 2022-11-23 DIAGNOSIS — K621 Rectal polyp: Secondary | ICD-10-CM | POA: Diagnosis not present

## 2022-11-23 DIAGNOSIS — I252 Old myocardial infarction: Secondary | ICD-10-CM | POA: Diagnosis not present

## 2022-11-23 HISTORY — PX: COLONOSCOPY WITH PROPOFOL: SHX5780

## 2022-11-23 HISTORY — PX: POLYPECTOMY: SHX5525

## 2022-11-23 LAB — GLUCOSE, CAPILLARY: Glucose-Capillary: 94 mg/dL (ref 70–99)

## 2022-11-23 SURGERY — COLONOSCOPY WITH PROPOFOL
Anesthesia: Monitor Anesthesia Care

## 2022-11-23 MED ORDER — LACTATED RINGERS IV SOLN
INTRAVENOUS | Status: DC
  Administered 2022-11-23: 1000 mL via INTRAVENOUS

## 2022-11-23 MED ORDER — PROPOFOL 500 MG/50ML IV EMUL
INTRAVENOUS | Status: DC | PRN
  Administered 2022-11-23: 100 ug/kg/min via INTRAVENOUS

## 2022-11-23 MED ORDER — PROPOFOL 10 MG/ML IV BOLUS
INTRAVENOUS | Status: DC | PRN
  Administered 2022-11-23: 80 mg via INTRAVENOUS

## 2022-11-23 MED ORDER — GLYCOPYRROLATE 0.2 MG/ML IJ SOLN
INTRAMUSCULAR | Status: DC | PRN
  Administered 2022-11-23: .2 mg via INTRAVENOUS

## 2022-11-23 SURGICAL SUPPLY — 22 items

## 2022-11-23 NOTE — Discharge Instructions (Signed)

## 2022-11-23 NOTE — Op Note (Signed)
[facility] Patient Name: Raymond Lutz Procedure Date: 11/23/2022 MRN: [id number] Attending MD: Willaim Rayas. Adela Lank , MD, [id number] Date of Birth: 1966-02-14 CSN: [id number] Age: 57 Admit Type: Outpatient Procedure:                Colonoscopy Indications:              This is the patient's first colonoscopy - CRC                            screening, hollow-up of history of left sided                            diverticulitis on CT scan 2022. Case performed at                            the hospital for anesthesia support, BMI > 50 Providers:                Willaim Rayas. Adela Lank, MD, Lorenza Evangelist, RN,                            Beryle Beams, Technician, Stephanie [place], CRNA Referring MD:             Willaim Rayas. Adela Lank, MD Medicines:                Monitored Anesthesia Care Complications:            No immediate complications. Estimated blood loss:                            Minimal. Estimated Blood Loss:     Estimated blood loss was minimal. Procedure:                Pre-Anesthesia Assessment:                           - Prior to the procedure, a History and Physical                            was performed, and patient medications and                            allergies were reviewed. The patient's tolerance of                            previous anesthesia was also reviewed. The risks                            and benefits of the procedure and the sedation                            options and risks were discussed with the patient.                            All questions were answered, and informed consent  was obtained. Prior Anticoagulants: The patient has                            taken Eliquis (apixaban), last dose was 2 days                            prior to procedure. ASA Grade Assessment: IV - A                            patient with severe systemic disease that is a                            constant threat to  life. After reviewing the risks                            and benefits, the patient was deemed in                            satisfactory condition to undergo the procedure.                           After obtaining informed consent, the colonoscope                            was passed under direct vision. Throughout the                            procedure, the patient's blood pressure, pulse, and                            oxygen saturations were monitored continuously. The                            [id number]-HQ190L ([id number]) Olympus colonoscope was                            introduced through the anus and advanced to the the                            cecum, identified by appendiceal orifice and                            ileocecal valve. The colonoscopy was performed                            without difficulty. The patient tolerated the                            procedure well. The quality of the bowel                            preparation was good. The ileocecal valve,  appendiceal orifice, and rectum were photographed. Scope In: 9:02:31 AM Scope Out: 9:21:45 AM Scope Withdrawal Time: 0 hours 14 minutes 30 seconds  Total Procedure Duration: 0 hours 19 minutes 14 seconds  Findings:      The perianal and digital rectal examinations were normal.      Multiple small-mouthed diverticula were found in the left colon.      A 4 mm polyp was found in the rectum. The polyp was sessile. The polyp       was removed with a cold snare. Resection and retrieval were complete.      Internal hemorrhoids were found during retroflexion.      The exam was otherwise without abnormality. Impression:               - Diverticulosis in the left colon.                           - One 4 mm polyp in the rectum, removed with a cold                            snare. Resected and retrieved.                           - Internal hemorrhoids.                           - The examination was  otherwise normal. Moderate Sedation:      No moderate sedation, case performed with MAC Recommendation:           - Patient has a contact number available for                            emergencies. The signs and symptoms of potential                            delayed complications were discussed with the                            patient. Return to normal activities tomorrow.                            Written discharge instructions were provided to the                            patient.                           - Resume previous diet.                           - Continue present medications.                           - Resume Eliquis tomorrow.                           - Await pathology results. Procedure Code(s):        --- Professional ---  16109, Colonoscopy, flexible; with removal of                            tumor(s), polyp(s), or other lesion(s) by snare                            technique Diagnosis Code(s):        --- Professional ---                           K64.8, Other hemorrhoids                           D12.8, Benign neoplasm of rectum                           K57.32, Diverticulitis of large intestine without                            perforation or abscess without bleeding                           K57.30, Diverticulosis of large intestine without                            perforation or abscess without bleeding CPT copyright 2022 American Medical Association. All rights reserved. The codes documented in this report are preliminary and upon coder review may  be revised to meet current compliance requirements. Viviann Spare P. Jeriann Sayres, MD 11/23/2022 9:27:23 AM This report has been signed electronically. Number of Addenda: 0

## 2022-11-23 NOTE — Transfer of Care (Signed)
Immediate Anesthesia Transfer of Care Note  Patient: Raymond Lutz  Procedure(s) Performed: COLONOSCOPY WITH PROPOFOL POLYPECTOMY  Patient Location: PACU and Endoscopy Unit  Anesthesia Type:MAC  Level of Consciousness: awake, alert , and oriented  Airway & Oxygen Therapy: Patient Spontanous Breathing and Patient connected to nasal cannula oxygen  Post-op Assessment: Report given to RN and Post -op Vital signs reviewed and stable  Post vital signs: Reviewed and stable  Last Vitals:  Vitals Value Taken Time  BP 118/52 11/23/22 0930  Temp 36.5 C 11/23/22 0928  Pulse 50 11/23/22 0931  Resp 17 11/23/22 0931  SpO2 98 % 11/23/22 0931  Vitals shown include unfiled device data.  Last Pain:  Vitals:   11/23/22 0928  TempSrc: Temporal  PainSc: 0-No pain         Complications: No notable events documented.

## 2022-11-23 NOTE — Anesthesia Postprocedure Evaluation (Signed)
Anesthesia Post Note  Patient: Raymond Lutz  Procedure(s) Performed: COLONOSCOPY WITH PROPOFOL POLYPECTOMY     Patient location during evaluation: PACU Anesthesia Type: MAC Level of consciousness: awake and alert Pain management: pain level controlled Vital Signs Assessment: post-procedure vital signs reviewed and stable Respiratory status: spontaneous breathing, nonlabored ventilation, respiratory function stable and patient connected to nasal cannula oxygen Cardiovascular status: stable and blood pressure returned to baseline Postop Assessment: no apparent nausea or vomiting Anesthetic complications: no  No notable events documented.  Last Vitals:  Vitals:   11/23/22 0928 11/23/22 0940  BP: (!) 118/52 (!) 143/67  Pulse: (!) 50 (!) 48  Resp: 15 10  Temp: 36.5 C   SpO2: 98% 97%    Last Pain:  Vitals:   11/23/22 0940  TempSrc:   PainSc: 0-No pain                 Caylan Schifano S
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
## 2022-11-23 NOTE — Anesthesia Preprocedure Evaluation (Signed)
Anesthesia Evaluation  Patient identified by MRN, date of birth, ID band Patient awake    Reviewed: Allergy & Precautions, H&P , NPO status , Patient's Chart, lab work & pertinent test results  Airway Mallampati: III  TM Distance: <3 FB Neck ROM: Full    Dental no notable dental hx.    Pulmonary shortness of breath, asthma , sleep apnea    breath sounds clear to auscultation + decreased breath sounds      Cardiovascular hypertension, + CAD, + Past MI and + Cardiac Stents  Normal cardiovascular exam+ dysrhythmias Atrial Fibrillation  Rhythm:Regular Rate:Normal     Neuro/Psych negative neurological ROS  negative psych ROS   GI/Hepatic negative GI ROS, Neg liver ROS,,,  Endo/Other  diabetes  Morbid obesity  Renal/GU negative Renal ROS  negative genitourinary   Musculoskeletal negative musculoskeletal ROS (+)    Abdominal  (+) + obese  Peds negative pediatric ROS (+)  Hematology negative hematology ROS (+)   Anesthesia Other Findings   Reproductive/Obstetrics negative OB ROS                             Anesthesia Physical Anesthesia Plan  ASA: 4  Anesthesia Plan: MAC   Post-op Pain Management: Minimal or no pain anticipated   Induction: Intravenous  PONV Risk Score and Plan: 1 and Propofol infusion and Treatment may vary due to age or medical condition  Airway Management Planned: Simple Face Mask  Additional Equipment:   Intra-op Plan:   Post-operative Plan:   Informed Consent: I have reviewed the patients History and Physical, chart, labs and discussed the procedure including the risks, benefits and alternatives for the proposed anesthesia with the patient or authorized representative who has indicated his/her understanding and acceptance.     Dental advisory given  Plan Discussed with: CRNA and Surgeon  Anesthesia Plan Comments:        Anesthesia Quick  Evaluation

## 2022-11-24 LAB — SURGICAL PATHOLOGY

## 2022-11-27 ENCOUNTER — Encounter (HOSPITAL_COMMUNITY): Payer: Self-pay | Admitting: Gastroenterology

## 2022-12-28 ENCOUNTER — Ambulatory Visit: Payer: BC Managed Care – PPO

## 2022-12-28 ENCOUNTER — Other Ambulatory Visit: Payer: Self-pay | Admitting: Cardiology

## 2023-01-14 ENCOUNTER — Other Ambulatory Visit: Payer: Self-pay | Admitting: Family Medicine

## 2023-01-14 DIAGNOSIS — E118 Type 2 diabetes mellitus with unspecified complications: Secondary | ICD-10-CM

## 2023-01-15 ENCOUNTER — Encounter: Payer: Self-pay | Admitting: Family Medicine

## 2023-01-15 ENCOUNTER — Ambulatory Visit: Payer: BC Managed Care – PPO | Admitting: Family Medicine

## 2023-01-15 VITALS — BP 150/74 | HR 62 | Temp 97.7°F | Ht 73.0 in | Wt 387.4 lb

## 2023-01-15 DIAGNOSIS — R6883 Chills (without fever): Secondary | ICD-10-CM | POA: Diagnosis not present

## 2023-01-15 DIAGNOSIS — R059 Cough, unspecified: Secondary | ICD-10-CM

## 2023-01-15 LAB — POC COVID19 BINAXNOW: SARS Coronavirus 2 Ag: NEGATIVE

## 2023-01-15 LAB — POCT INFLUENZA A/B
Influenza A, POC: NEGATIVE
Influenza B, POC: NEGATIVE

## 2023-01-15 LAB — POCT RAPID STREP A (OFFICE): Rapid Strep A Screen: NEGATIVE
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
## 2023-02-02 ENCOUNTER — Other Ambulatory Visit: Payer: Self-pay | Admitting: Cardiology

## 2023-02-14 ENCOUNTER — Other Ambulatory Visit: Payer: Self-pay | Admitting: Cardiology

## 2023-02-25 ENCOUNTER — Other Ambulatory Visit: Payer: Self-pay | Admitting: Cardiology

## 2023-02-25 DIAGNOSIS — Z760 Encounter for issue of repeat prescription: Secondary | ICD-10-CM

## 2023-02-25 DIAGNOSIS — I10 Essential (primary) hypertension: Secondary | ICD-10-CM

## 2023-03-19 ENCOUNTER — Telehealth: Payer: Self-pay | Admitting: Family Medicine

## 2023-03-19 NOTE — Telephone Encounter (Signed)
 FYI Pt has appointment with Dr Clent Ridges on 03/22/23

## 2023-03-19 NOTE — Telephone Encounter (Signed)
 Pt sent mychart message and have asthma issue. I sent pt mychart message asking pt to return my call so I could transfer him to  speak with triage nurse. Pt made an appt for 03-22-2023. Please advise

## 2023-03-20 ENCOUNTER — Ambulatory Visit: Payer: Self-pay | Admitting: Family Medicine

## 2023-03-20 NOTE — Telephone Encounter (Signed)
 Noted.

## 2023-03-20 NOTE — Telephone Encounter (Signed)
 FYI Pt has appointment with Dr Caryl Never on 03/21/23

## 2023-03-20 NOTE — Telephone Encounter (Signed)
  Chief Complaint: SOB, cough Symptoms: SOB, cough, wheezing Frequency: Ongoing for about 2 weeks following viral cold Pertinent Negatives: Patient denies fever, CP Disposition: [] ED /[] Urgent Care (no appt availability in office) / [x] Appointment(In office/virtual)/ []  [facility]/ [] Home Care/ [] Refused Recommended Disposition /[] [facility]/ []  Follow-up with PCP Additional Notes: Pt calls in reporting he has a history of asthma and since he recovered from what he believes was a viral cold about 2 weeks ago he has since been experiencing a flare in his symptoms to include SOB, wheezing, cough and increased use of his emergency inhaler and nebulizer. Pt denies CP, fever. This RN advised pt he should be seen today, declines available appt today due to work, scheduled tomorrow AM with provider. This RN educated pt on home care, new-worsening symptoms, when to call back/seek emergent care. Pt verbalized understanding and agrees to plan.   Reason for Disposition  [1] MILD difficulty breathing (e.g., minimal/no SOB at rest, SOB with walking, pulse <100) AND [2] NEW-onset or WORSE than normal  Answer Assessment - Initial Assessment Questions 1. RESPIRATORY STATUS: Describe your breathing? (e.g., wheezing, shortness of breath, unable to speak, severe coughing)      Wheezing, coughing, SOB 2. ONSET: When did this breathing problem begin?      About 2 weeks ago when he had a viral cold 3. PATTERN Does the difficult breathing come and go, or has it been constant since it started?      Comes and goes 4. SEVERITY: How bad is your breathing? (e.g., mild, moderate, severe)    - MILD: No SOB at rest, mild SOB with walking, speaks normally in sentences, can lie down, no retractions, pulse < 100.    - MODERATE: SOB at rest, SOB with minimal exertion and prefers to sit, cannot lie down flat, speaks in phrases, mild retractions, audible wheezing, pulse 100-120.    - SEVERE: Very  SOB at rest, speaks in single words, struggling to breathe, sitting hunched forward, retractions, pulse > 120      Mild, may need to sit and rest 5. RECURRENT SYMPTOM: Have you had difficulty breathing before? If Yes, ask: When was the last time? and What happened that time?      Yes, hx of asthma 6. CARDIAC HISTORY: Do you have any history of heart disease? (e.g., heart attack, angina, bypass surgery, angioplasty)      Hx of stent placement approx 3 yrs ago 7. LUNG HISTORY: Do you have any history of lung disease?  (e.g., pulmonary embolus, asthma, emphysema)     Asthma 8. CAUSE: What do you think is causing the breathing problem?      Hx of asthma 9. OTHER SYMPTOMS: Do you have any other symptoms? (e.g., dizziness, runny nose, cough, chest pain, fever)     Cough 10. O2 SATURATION MONITOR:  Do you use an oxygen saturation monitor (pulse oximeter) at home? If Yes, ask: What is your reading (oxygen level) today? What is your usual oxygen saturation reading? (e.g., 95%)       Checking periodically, WNL  Protocols used: Breathing Difficulty-A-AH

## 2023-03-20 NOTE — Telephone Encounter (Signed)
 I transferred pt to spoke with triage nurse and pt is now sch to see dr Kern Alberta tomorrow

## 2023-03-21 ENCOUNTER — Ambulatory Visit: Payer: BC Managed Care – PPO | Admitting: Family Medicine

## 2023-03-21 VITALS — BP 158/82 | HR 63 | Temp 98.0°F | Wt >= 6400 oz

## 2023-03-21 DIAGNOSIS — J4521 Mild intermittent asthma with (acute) exacerbation: Secondary | ICD-10-CM | POA: Diagnosis not present

## 2023-03-21 MED ORDER — PREDNISONE 20 MG PO TABS: ORAL_TABLET | ORAL | 0 refills | Status: DC
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
## 2023-03-22 ENCOUNTER — Ambulatory Visit: Payer: BC Managed Care – PPO | Admitting: Family Medicine

## 2023-04-28 ENCOUNTER — Other Ambulatory Visit: Payer: Self-pay | Admitting: Cardiology

## 2023-05-10 ENCOUNTER — Ambulatory Visit: Payer: BC Managed Care – PPO | Admitting: Family Medicine

## 2023-05-10 ENCOUNTER — Encounter: Payer: Self-pay | Admitting: Family Medicine

## 2023-05-10 ENCOUNTER — Other Ambulatory Visit: Payer: Self-pay | Admitting: Family Medicine

## 2023-05-10 VITALS — BP 136/80 | HR 57 | Temp 98.5°F | Wt 389.0 lb

## 2023-05-10 DIAGNOSIS — M79605 Pain in left leg: Secondary | ICD-10-CM

## 2023-05-10 MED ORDER — TADALAFIL 20 MG PO TABS: 20.0000 mg | ORAL_TABLET | ORAL | 11 refills | Status: DC | PRN

## 2023-05-10 MED ORDER — LORAZEPAM 0.5 MG PO TABS: 0.5000 mg | ORAL_TABLET | Freq: Three times a day (TID) | ORAL | 2 refills | Status: AC | PRN

## 2023-05-10 MED ORDER — SCOPOLAMINE 1 MG/3DAYS TD PT72: 1.0000 | MEDICATED_PATCH | TRANSDERMAL | 2 refills | Status: AC

## 2023-05-10 MED ORDER — METFORMIN HCL ER 500 MG PO TB24: 500.0000 mg | ORAL_TABLET | Freq: Every day | ORAL | 3 refills | Status: AC

## 2023-05-10 NOTE — Progress Notes (Signed)
   Subjective:    Patient ID: Raymond Lutz, male    DOB: [birth year], 58 y.o.   MRN: [id number]  HPI Here for one week of intermittent cramping type pains in the left lateral lower leg. No recent trauma. No more swelling than usual, but he does have some baseline edema. He wears support stockings every day on his job. No SOB. He takes Eliquis for his atrial fibrillation.    Review of Systems  Constitutional: Negative.   Respiratory: Negative.    Cardiovascular:  Positive for leg swelling. Negative for chest pain and palpitations.  Musculoskeletal:  Positive for myalgias.       Objective:   Physical Exam Constitutional:      General: He is not in acute distress.    Appearance: He is obese.     Comments: He walks normally   Cardiovascular:     Rate and Rhythm: Normal rate and regular rhythm.     Pulses: Normal pulses.     Heart sounds: Normal heart sounds.  Pulmonary:     Effort: Pulmonary effort is normal.     Breath sounds: Normal breath sounds.  Musculoskeletal:     Comments: 1+ edema in both ankles . He is tender along the left lateral calf. No cords or masses. Homan's is negative   Neurological:     Mental Status: He is alert.           Assessment & Plan:  Left lower leg pains which are consistent with muscle cramps. Even though the likelihood of this being due to a thrombus is quite low ,we will set up a venous doppler. I suggested he drink plenty of fluids, apply warm compresses, and take 800 mg of magnesium daily.  Gershon Crane, MD

## 2023-05-14 ENCOUNTER — Other Ambulatory Visit: Payer: Self-pay | Admitting: Cardiology

## 2023-05-14 DIAGNOSIS — I10 Essential (primary) hypertension: Secondary | ICD-10-CM

## 2023-05-14 DIAGNOSIS — Z760 Encounter for issue of repeat prescription: Secondary | ICD-10-CM

## 2023-05-20 ENCOUNTER — Other Ambulatory Visit: Payer: Self-pay | Admitting: Cardiology

## 2023-05-20 DIAGNOSIS — I480 Paroxysmal atrial fibrillation: Secondary | ICD-10-CM

## 2023-05-21 NOTE — Telephone Encounter (Signed)
 Prescription refill request for Eliquis received. Indication:afib Last office visit:9/24 Scr:0.76  4/24 Age: 58 Weight:176.4  kg  Prescription refilled

## 2023-05-30 ENCOUNTER — Other Ambulatory Visit: Payer: Self-pay | Admitting: Cardiology

## 2023-06-03 ENCOUNTER — Other Ambulatory Visit: Payer: Self-pay | Admitting: Family Medicine

## 2023-06-04 ENCOUNTER — Telehealth: Payer: Self-pay

## 2023-06-04 MED ORDER — FREESTYLE LITE TEST VI STRP: ORAL_STRIP | 0 refills | Status: DC

## 2023-06-04 NOTE — Telephone Encounter (Signed)
 Copied from CRM [id number]. Topic: Clinical - Prescription Issue >> Jun 04, 2023  3:16 PM Kathryne Eriksson wrote: Reason for CRM: glucose blood (FREESTYLE LITE) test strip >> Jun 04, 2023  3:18 PM Kathryne Eriksson wrote: [facility] [phone] Called on behalf of medication, states she's needing clear instructions as far as how many times a day patient is testing so that they're able to bill insurance.

## 2023-06-05 NOTE — Telephone Encounter (Signed)
 This was sent to pt pharmacy on 06/04/23

## 2023-06-06 ENCOUNTER — Other Ambulatory Visit: Payer: Self-pay

## 2023-06-06 MED ORDER — FREESTYLE LITE TEST VI STRP: ORAL_STRIP | 1 refills | Status: AC

## 2023-06-13 ENCOUNTER — Ambulatory Visit: Admitting: Family Medicine

## 2023-06-17 ENCOUNTER — Telehealth: Admitting: Family

## 2023-06-17 DIAGNOSIS — H66002 Acute suppurative otitis media without spontaneous rupture of ear drum, left ear: Secondary | ICD-10-CM | POA: Diagnosis not present

## 2023-06-17 DIAGNOSIS — J029 Acute pharyngitis, unspecified: Secondary | ICD-10-CM

## 2023-06-17 MED ORDER — CETIRIZINE HCL 10 MG PO TABS
10.0000 mg | ORAL_TABLET | Freq: Every day | ORAL | 1 refills | Status: AC
Start: 2023-06-17 — End: ?

## 2023-06-17 MED ORDER — AMOXICILLIN-POT CLAVULANATE 875-125 MG PO TABS
1.0000 | ORAL_TABLET | Freq: Two times a day (BID) | ORAL | 0 refills | Status: DC
Start: 2023-06-17 — End: 2023-07-05

## 2023-06-17 NOTE — Progress Notes (Signed)
 Virtual Visit Consent   Raymond Lutz, you are scheduled for a virtual visit with a [facility] provider today. Just as with appointments in the office, your consent must be obtained to participate. Your consent will be active for this visit and any virtual visit you may have with one of our providers in the next 365 days. If you have a MyChart account, a copy of this consent can be sent to you electronically.  As this is a virtual visit, video technology does not allow for your provider to perform a traditional examination. This may limit your provider's ability to fully assess your condition. If your provider identifies any concerns that need to be evaluated in person or the need to arrange testing (such as labs, EKG, etc.), we will make arrangements to do so. Although advances in technology are sophisticated, we cannot ensure that it will always work on either your end or our end. If the connection with a video visit is poor, the visit may have to be switched to a telephone visit. With either a video or telephone visit, we are not always able to ensure that we have a secure connection.  By engaging in this virtual visit, you consent to the provision of healthcare and authorize for your insurance to be billed (if applicable) for the services provided during this visit. Depending on your insurance coverage, you may receive a charge related to this service.  I need to obtain your verbal consent now. Are you willing to proceed with your visit today? Raymond Lutz has provided verbal consent on 06/17/2023 for a virtual visit (video or telephone). Jannifer Rodney, FNP  Date: 06/17/2023 12:09 PM   Virtual Visit via Video Note   I, Jannifer Rodney, connected with  Raymond Lutz  ([id number], [id number]) on 06/17/23 at 12:15 PM EDT by a video-enabled telemedicine application and verified that I am speaking with the correct person using two identifiers.  Location: Patient: Virtual Visit Location  Patient: Home Provider: Virtual Visit Location Provider: Home Office   I discussed the limitations of evaluation and management by telemedicine and the availability of in person appointments. The patient expressed understanding and agreed to proceed.    History of Present Illness: Raymond Lutz is a 58 y.o. who identifies as a male who was assigned male at birth, and is being seen today for sore throat, rhinorrhea, and ear pain that started yesterday. Told a home COVID test.   HPI: Sore Throat  This is a new problem. The current episode started today. The problem has been unchanged. The pain is worse on the left side. Maximum temperature: 99 F. The pain is at a severity of 6/10. The pain is moderate. Associated symptoms include congestion, ear pain, a hoarse voice and swollen glands (left). Pertinent negatives include no coughing, headaches, shortness of breath or trouble swallowing. He has tried acetaminophen for the symptoms. The treatment provided mild relief.    Problems:  Patient Active Problem List   Diagnosis Date Noted   History of diverticulitis 11/23/2022   Benign neoplasm of colon 11/23/2022   Atrial fibrillation (HCC)    Hypogonadism in male 03/24/2021   Situational anxiety 03/24/2021   FH: cerebral aneurysm 01/20/2021   Erectile dysfunction 01/20/2021   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/2019   Pure hypercholesterolemia    Stented coronary artery 09/15/2019   Arthritis 09/15/2019   Asthma 07/10/2018   Kidney stone on left side 12/27/2016   OSA (obstructive sleep  apnea) 12/18/2015   NSTEMI (non-ST elevated myocardial infarction) (HCC) 12/16/2015   Carotid bruit 05/17/2011   CAD (coronary artery disease) 05/04/2011   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   GERD (gastroesophageal reflux disease)    Hyperlipidemia LDL goal <70 10/17/2006   Morbid obesity (HCC) 10/17/2006   Essential hypertension 10/17/2006    Allergies: No Known Allergies Medications:   Current Outpatient Medications:    amoxicillin-clavulanate (AUGMENTIN) 875-125 MG tablet, Take 1 tablet by mouth 2 (two) times daily., Disp: 14 tablet, Rfl: 0   cetirizine (ZYRTEC ALLERGY) 10 MG tablet, Take 1 tablet (10 mg total) by mouth daily., Disp: 90 tablet, Rfl: 1   albuterol (VENTOLIN HFA) 108 (90 Base) MCG/ACT inhaler, INHALE 2 PUFFS BY MOUTH INTO THE LUNGS EVERY 4 HOURS AS NEEDED, Disp: 18 g, Rfl: 0   amLODipine (NORVASC) 10 MG tablet, TAKE 1 TABLET BY MOUTH ONCE DAILY . APPOINTMENT REQUIRED FOR FUTURE REFILLS [phone], Disp: 90 tablet, Rfl: 0   apixaban (ELIQUIS) 5 MG TABS tablet, Take 1 tablet by mouth twice daily, Disp: 180 tablet, Rfl: 1   aspirin EC 81 MG tablet, Take 1 tablet (81 mg total) by mouth daily., Disp: 90 tablet, Rfl: 3   atorvastatin (LIPITOR) 80 MG tablet, Take 1 tablet by mouth once daily, Disp: 90 tablet, Rfl: 1   budesonide (PULMICORT) 1 MG/2ML nebulizer solution, Take 1 mg by nebulization as needed (shortness of breath or wheezing)., Disp: , Rfl:    diclofenac Sodium (VOLTAREN) 1 % GEL, Apply 2 g topically daily as needed (pain)., Disp: , Rfl:    ezetimibe (ZETIA) 10 MG tablet, Take 1 tablet by mouth once daily, Disp: 90 tablet, Rfl: 3   fluticasone (FLONASE) 50 MCG/ACT nasal spray, Place 1 spray into both nostrils daily. (Patient taking differently: Place 1 spray into both nostrils daily as needed for allergies.), Disp: 16 g, Rfl: 0   furosemide (LASIX) 40 MG tablet, Take 1 tablet by mouth twice daily, Disp: 180 tablet, Rfl: 2   glipiZIDE (GLUCOTROL) 5 MG tablet, TAKE 1 TABLET BY MOUTH TWICE DAILY BEFORE A MEAL, Disp: 180 tablet, Rfl: 0   glucose blood (FREESTYLE LITE) test strip, Use to test blood glucose 3 times daily, Disp: 100 each, Rfl: 1   isosorbide mononitrate (IMDUR) 30 MG 24 hr tablet, Take 1 tablet by mouth once daily, Disp: 90 tablet, Rfl: 2   LORazepam (ATIVAN) 0.5 MG tablet, Take 1 tablet (0.5 mg total) by mouth every 8 (eight) hours as needed for  anxiety., Disp: 60 tablet, Rfl: 2   losartan (COZAAR) 100 MG tablet, Take 1 tablet by mouth once daily, Disp: 90 tablet, Rfl: 3   metFORMIN (GLUCOPHAGE-XR) 500 MG 24 hr tablet, Take 1 tablet (500 mg total) by mouth daily with breakfast., Disp: 90 tablet, Rfl: 3   metoprolol tartrate (LOPRESSOR) 50 MG tablet, Take 1 tablet by mouth twice daily, Disp: 180 tablet, Rfl: 3   nitroGLYCERIN (NITROSTAT) 0.4 MG SL tablet, DISSOLVE ONE TABLET UNDER THE TONGUE EVERY 5 MINUTES AS NEEDED FOR CHEST PAIN.  DO NOT EXCEED A TOTAL OF 3 DOSES IN 15 MINUTES, Disp: 25 tablet, Rfl: 10   NON FORMULARY, Pt uses a c pap nightly, Disp: , Rfl:    Potassium Chloride ER 20 MEQ TBCR, TAKE 2 TABLETS BY MOUTH IN THE MORNING, Disp: 180 tablet, Rfl: 1   scopolamine (TRANSDERM SCOP, 1.5 MG,) 1 MG/3DAYS, Place 1 patch (1.5 mg total) onto the skin every 3 (three) days., Disp: 10 patch, Rfl: 2  tadalafil (CIALIS) 20 MG tablet, TAKE 1 TABLET BY MOUTH AS NEEDED FOR ERECTILE DYSFUNCTION, Disp: 10 tablet, Rfl: 11  Observations/Objective: Patient is well-developed, well-nourished in no acute distress.  Resting comfortably  at home.  Head is normocephalic, atraumatic.  No labored breathing.  Speech is clear and coherent with logical content.  Patient is alert and oriented at baseline.  Nasal congestion  Assessment and Plan: 1. Acute pharyngitis, unspecified etiology (Primary) - amoxicillin-clavulanate (AUGMENTIN) 875-125 MG tablet; Take 1 tablet by mouth 2 (two) times daily.  Dispense: 14 tablet; Refill: 0 - cetirizine (ZYRTEC ALLERGY) 10 MG tablet; Take 1 tablet (10 mg total) by mouth daily.  Dispense: 90 tablet; Refill: 1  2. Non-recurrent acute suppurative otitis media of left ear without spontaneous rupture of tympanic membrane - amoxicillin-clavulanate (AUGMENTIN) 875-125 MG tablet; Take 1 tablet by mouth 2 (two) times daily.  Dispense: 14 tablet; Refill: 0 - cetirizine (ZYRTEC ALLERGY) 10 MG tablet; Take 1 tablet (10 mg total)  by mouth daily.  Dispense: 90 tablet; Refill: 1  - Take meds as prescribed - Use a cool mist humidifier  -Use saline nose sprays frequently -Force fluids -For any cough or congestion  Use plain Mucinex- regular strength or max strength is fine -For fever or aces or pains- take tylenol or ibuprofen. -Throat lozenges if help -New toothbrush in 3 days    Follow Up Instructions: I discussed the assessment and treatment plan with the patient. The patient was provided an opportunity to ask questions and all were answered. The patient agreed with the plan and demonstrated an understanding of the instructions.  A copy of instructions were sent to the patient via MyChart unless otherwise noted below.     The patient was advised to call back or seek an in-person evaluation if the symptoms worsen or if the condition fails to improve as anticipated.    Jannifer Rodney, FNP

## 2023-06-20 ENCOUNTER — Ambulatory Visit: Admitting: Family Medicine

## 2023-06-25 ENCOUNTER — Ambulatory Visit: Payer: Self-pay

## 2023-06-25 ENCOUNTER — Ambulatory Visit: Admitting: Family Medicine

## 2023-06-25 ENCOUNTER — Encounter: Payer: Self-pay | Admitting: Emergency Medicine

## 2023-06-25 ENCOUNTER — Ambulatory Visit: Admitting: Emergency Medicine

## 2023-06-25 VITALS — BP 158/80 | HR 54 | Temp 98.2°F | Ht 73.0 in | Wt 373.1 lb

## 2023-06-25 DIAGNOSIS — I10 Essential (primary) hypertension: Secondary | ICD-10-CM | POA: Diagnosis not present

## 2023-06-25 DIAGNOSIS — Z7901 Long term (current) use of anticoagulants: Secondary | ICD-10-CM | POA: Insufficient documentation

## 2023-06-25 DIAGNOSIS — R109 Unspecified abdominal pain: Secondary | ICD-10-CM | POA: Insufficient documentation

## 2023-06-25 DIAGNOSIS — Z87442 Personal history of urinary calculi: Secondary | ICD-10-CM | POA: Insufficient documentation

## 2023-06-25 DIAGNOSIS — R1030 Lower abdominal pain, unspecified: Secondary | ICD-10-CM | POA: Insufficient documentation

## 2023-06-25 DIAGNOSIS — R361 Hematospermia: Secondary | ICD-10-CM | POA: Insufficient documentation

## 2023-06-25 DIAGNOSIS — R310 Gross hematuria: Secondary | ICD-10-CM | POA: Diagnosis not present

## 2023-06-25 DIAGNOSIS — I4891 Unspecified atrial fibrillation: Secondary | ICD-10-CM

## 2023-06-25 LAB — POC URINALSYSI DIPSTICK (AUTOMATED)
Bilirubin, UA: NEGATIVE
Blood, UA: POSITIVE
Glucose, UA: NEGATIVE
Ketones, UA: NEGATIVE
Leukocytes, UA: NEGATIVE
Nitrite, UA: NEGATIVE
Protein, UA: NEGATIVE
Spec Grav, UA: 1.015 (ref 1.010–1.025)
Urobilinogen, UA: 0.2 U/dL
pH, UA: 7 (ref 5.0–8.0)

## 2023-06-25 LAB — CBC WITH DIFFERENTIAL/PLATELET
Basophils Absolute: 0.1 10*3/uL (ref 0.0–0.1)
Basophils Relative: 1.5 % (ref 0.0–3.0)
Eosinophils Absolute: 0.3 10*3/uL (ref 0.0–0.7)
Eosinophils Relative: 5.8 % — ABNORMAL HIGH (ref 0.0–5.0)
HCT: 42.4 % (ref 39.0–52.0)
Hemoglobin: 14 g/dL (ref 13.0–17.0)
Lymphocytes Relative: 44.8 % (ref 12.0–46.0)
Lymphs Abs: 2 10*3/uL (ref 0.7–4.0)
MCHC: 33 g/dL (ref 30.0–36.0)
MCV: 83.3 fl (ref 78.0–100.0)
Monocytes Absolute: 0.5 10*3/uL (ref 0.1–1.0)
Monocytes Relative: 10.1 % (ref 3.0–12.0)
Neutro Abs: 1.7 10*3/uL (ref 1.4–7.7)
Neutrophils Relative %: 37.8 % — ABNORMAL LOW (ref 43.0–77.0)
Platelets: 247 10*3/uL (ref 150.0–400.0)
RBC: 5.09 Mil/uL (ref 4.22–5.81)
RDW: 15.3 % (ref 11.5–15.5)
WBC: 4.5 10*3/uL (ref 4.0–10.5)

## 2023-06-25 LAB — COMPREHENSIVE METABOLIC PANEL WITH GFR
ALT: 20 U/L (ref 0–53)
AST: 21 U/L (ref 0–37)
Albumin: 4.6 g/dL (ref 3.5–5.2)
Alkaline Phosphatase: 67 U/L (ref 39–117)
BUN: 12 mg/dL (ref 6–23)
CO2: 30 meq/L (ref 19–32)
Calcium: 9.5 mg/dL (ref 8.4–10.5)
Chloride: 102 meq/L (ref 96–112)
Creatinine, Ser: 0.89 mg/dL (ref 0.40–1.50)
GFR: 95.87 mL/min (ref >60.00)
Glucose, Bld: 138 mg/dL — ABNORMAL HIGH (ref 70–99)
Potassium: 3.7 meq/L (ref 3.5–5.1)
Sodium: 139 meq/L (ref 135–145)
Total Bilirubin: 0.5 mg/dL (ref 0.2–1.2)
Total Protein: 7.4 g/dL (ref 6.0–8.3)

## 2023-06-25 LAB — MICROALBUMIN / CREATININE URINE RATIO
Creatinine,U: 26.6 mg/dL
Microalb Creat Ratio: 54.9 mg/g — ABNORMAL HIGH (ref 0.0–30.0)
Microalb, Ur: 1.5 mg/dL (ref 0.0–1.9)

## 2023-06-25 NOTE — Telephone Encounter (Signed)
 Copied from CRM [id number]. Topic: Appointments - Appointment Scheduling >> Jun 25, 2023  8:16 AM Rosaria Common wrote: Patient/patient representative is calling to schedule an appointment. Refer to attachments for appointment information. Pt is passing blood via urine. Warm transfer to nurse. Red word.  Chief Complaint: Hematuria Symptoms: Frequency, mild flank pain, mild abdominal pain Frequency: Yesterday and today Pertinent Negatives: Patient denies fever Disposition: [] ED /[] Urgent Care (no appt availability in office) / [x] Appointment(In office/virtual)/ []  [facility]/ [] Home Care/ [] Refused Recommended Disposition /[] [facility]/ []  Follow-up with PCP Additional Notes: Patient called in to report hematuria that has happened 2x since yesterday. Patient reported mild flank and abdominal pain and urinary frequency. Patient denied fever. Patient stated he is still able to empty his bladder. Advised patient to see provider within 4, per protocol. No availability with PCP. Patient stated he would prefer a male provider. Scheduled same day appointment at alternate office, due to patient preference. Provided care advice and instructed patient to call back if symptoms worsen. Patient complied.   Reason for Disposition  Taking Coumadin (warfarin) or other strong blood thinner, or known bleeding disorder (e.g., thrombocytopenia)  Answer Assessment - Initial Assessment Questions 1. COLOR of URINE: "Describe the color of the urine."  (e.g., tea-colored, pink, red, bloody) "Do you have blood clots in your urine?" (e.g., none, pea, grape, small coin)     Toilet water was pink yesterday, saw "droplets" today 2. ONSET: "When did the bleeding start?"      Noticed it 1x yesterday and again this morning 3. EPISODES: "How many times has there been blood in the urine?" or "How many times today?"     2x 4. PAIN with URINATION: "Is there any pain with passing your urine?" If Yes, ask: "How bad  is the pain?"  (Scale 1-10; or mild, moderate, severe)    - MILD: Complains slightly about urination hurting.    - MODERATE: Interferes with normal activities.      - SEVERE: Excruciating, unwilling or unable to urinate because of the pain.      Denies pain 5. FEVER: "Do you have a fever?" If Yes, ask: "What is your temperature, how was it measured, and when did it start?"     Denies fever 6. ASSOCIATED SYMPTOMS: "Are you passing urine more frequently than usual?"     States he was using bathroom more than usual yesterday 7. OTHER SYMPTOMS: "Do you have any other symptoms?" (e.g., back/flank pain, abdomen pain, vomiting)     Mild flank discomfort, mild abdominal pain, denies vomiting, states he feels that he is still able to empty bladder  Protocols used: Urine - Blood In-A-AH

## 2023-06-25 NOTE — Assessment & Plan Note (Signed)
 BP Readings from Last 3 Encounters:  06/25/23 (!) 158/80  05/10/23 136/80  03/21/23 (!) 158/82  Elevated blood pressure reading in the office today Continues amlodipine 10 mg daily and losartan 100 mg daily along with metoprolol tartrate 50 mg twice a day and isosorbide 30 mg daily

## 2023-06-25 NOTE — Patient Instructions (Signed)
 Blood in the Pee (Hematuria) in Adults: What to Know  Hematuria is blood in the pee. You may be able to see blood in the pee. In some cases, a health care provider may find blood with a test.  Blood in the pee can be caused by infections of the kidney, bladder, or the urethra. The urethra is the tube that drains pee from the bladder.  Other causes may include: Kidney stones. Infection of the prostate. Cancer. Too much calcium in the pee. Conditions that are passed from parent to child. Too much exercise. Infections can be treated with medicine. A kidney stone will usually leave your body when you pee. If infections or kidney stones didn't cause the blood in the urine, then more tests may be needed. It is very important to tell your provider about any blood in your pee, even if you have no pain or the blood stops with no treatment. Blood in the pee can be a sign of a very serious problem, such as cancer. Follow these instructions at home: Medicines Take your medicines only as told. If you were given antibiotics, take them as told. Do not stop taking them even if you start to feel better. Eating and drinking Drink more fluids as told. Aim to drink 3-4 quarts (2.8-3.8 L) a day. Avoid caffeine, tea, and carbonated drinks. These can bother the bladder. Avoid alcohol if a male because it may irritate the prostate. General instructions If you have been diagnosed with a kidney stone, strain your pee to catch the stone if told by your provider. Empty your bladder often. Avoid holding pee for a long time. If you're male, make sure that: You wipe from front to back after using the bathroom. You use each piece of toilet paper only once. You pee before and after sex. It's up to you to get the results of any tests. Ask when your results will be ready and how to get them. You may need to call or meet with your provider to get your results. Keep all follow-up visits. Your provider will need to know  about any changes or any new symptoms. Contact a health care provider if: Your symptoms don't get better after 3 days. Your symptoms get worse. You have back pain or belly pain. You have a fever or chills. You throw up or feel like you may throw up. You throw up every time you take medicine. Get help right away if: You pass blood clots in your pee. You pass out. These symptoms may be an emergency. Call 911 right away. Do not wait to see if the symptoms will go away. Do not drive yourself to the hospital. This information is not intended to replace advice given to you by your health care provider. Make sure you discuss any questions you have with your health care provider. Document Revised: 12/14/2022 Document Reviewed: 11/23/2022 Elsevier Patient Education  2024 [employer].

## 2023-06-25 NOTE — Assessment & Plan Note (Signed)
 New finding.  Could be related to Eliquis and forceful ejaculation yesterday morning during intercourse
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
## 2023-06-25 NOTE — Assessment & Plan Note (Signed)
 History of kidney stones Right-sided flank pain with hematuria likely renal colic Recommend renal CT

## 2023-06-25 NOTE — Assessment & Plan Note (Signed)
 Most likely related to renal colic

## 2023-06-25 NOTE — Assessment & Plan Note (Signed)
 On Eliquis and history of kidney stones Clinically stable.  No red flag signs or symptoms Recently finished course of antibiotics.  Last dose yesterday Urine positive for blood.  Sent for culture. Recommend renal CT

## 2023-06-25 NOTE — Assessment & Plan Note (Addendum)
 Normal sinus rhythm today with good ventricular response Continues Eliquis 5 mg twice a day Rate control with metoprolol tartrate 50 mg twice a day

## 2023-06-26 ENCOUNTER — Ambulatory Visit
Admission: RE | Admit: 2023-06-26 | Discharge: 2023-06-26 | Disposition: A | Discharge status: Home / Self Care | Source: Ambulatory Visit | Attending: Emergency Medicine | Admitting: Emergency Medicine

## 2023-06-26 DIAGNOSIS — R109 Unspecified abdominal pain: Secondary | ICD-10-CM | POA: Diagnosis not present

## 2023-06-26 DIAGNOSIS — K5730 Diverticulosis of large intestine without perforation or abscess without bleeding: Secondary | ICD-10-CM | POA: Diagnosis not present

## 2023-06-26 DIAGNOSIS — R1030 Lower abdominal pain, unspecified: Secondary | ICD-10-CM

## 2023-06-26 DIAGNOSIS — R10A0 Flank pain, unspecified side: Secondary | ICD-10-CM

## 2023-06-26 DIAGNOSIS — Z7901 Long term (current) use of anticoagulants: Secondary | ICD-10-CM

## 2023-06-26 DIAGNOSIS — Z87442 Personal history of urinary calculi: Secondary | ICD-10-CM

## 2023-06-26 DIAGNOSIS — R310 Gross hematuria: Secondary | ICD-10-CM

## 2023-06-26 LAB — URINE CULTURE: Result:: NO GROWTH

## 2023-07-03 ENCOUNTER — Telehealth: Payer: Self-pay

## 2023-07-03 NOTE — Telephone Encounter (Signed)
 FYI Spoke with Pt state that he had appointment at the [facility] on 06/25/23 for the issue of blood in the urine and semen. Pt state that he had CT done and wanted to know the results, advised that the CT results are not back, pt is aware that he will be notified when we receive results.

## 2023-07-03 NOTE — Telephone Encounter (Signed)
 Copied from CRM [id number]. Topic: Clinical - Lab/Test Results >> Jul 03, 2023 10:17 AM Juluis Ok wrote: Reason for CRM: Patient calling to obtain CT results.

## 2023-07-03 NOTE — Telephone Encounter (Signed)
 FYI Pt has appointment with Dr Alyne Babinski on 07/05/23

## 2023-07-05 ENCOUNTER — Ambulatory Visit (INDEPENDENT_AMBULATORY_CARE_PROVIDER_SITE_OTHER): Admitting: Family Medicine

## 2023-07-05 ENCOUNTER — Encounter: Payer: Self-pay | Admitting: Emergency Medicine

## 2023-07-05 ENCOUNTER — Ambulatory Visit: Admitting: Family Medicine

## 2023-07-05 ENCOUNTER — Encounter: Payer: Self-pay | Admitting: Family Medicine

## 2023-07-05 VITALS — BP 138/76 | HR 58 | Temp 98.2°F | Ht 73.0 in | Wt 374.0 lb

## 2023-07-05 DIAGNOSIS — Z0000 Encounter for general adult medical examination without abnormal findings: Secondary | ICD-10-CM

## 2023-07-05 LAB — LIPID PANEL
Cholesterol: 124 mg/dL (ref 0–200)
HDL: 37.7 mg/dL — ABNORMAL LOW (ref >39.00)
LDL Cholesterol: 73 mg/dL (ref 0–99)
NonHDL: 86.22
Total CHOL/HDL Ratio: 3
Triglycerides: 65 mg/dL (ref 0.0–149.0)
VLDL: 13 mg/dL (ref 0.0–40.0)

## 2023-07-05 LAB — PSA: PSA: 0.19 ng/mL (ref 0.10–4.00)

## 2023-07-05 LAB — TSH: TSH: 1.2 u[IU]/mL (ref 0.35–5.50)

## 2023-07-05 LAB — HEMOGLOBIN A1C: Hgb A1c MFr Bld: 7.4 % — ABNORMAL HIGH (ref 4.6–6.5)

## 2023-07-05 MED ORDER — OZEMPIC (0.25 OR 0.5 MG/DOSE) 2 MG/3ML ~~LOC~~ SOPN: 0.2500 mg | PEN_INJECTOR | SUBCUTANEOUS | 2 refills | Status: DC

## 2023-07-05 NOTE — Telephone Encounter (Signed)
 No results available yet.  Thanks.

## 2023-07-05 NOTE — Progress Notes (Signed)
 Subjective:    Patient ID: Raymond Lutz, male    DOB: 11-11-1965, 58 y.o.   MRN: [id number]  HPI Here for a well exam. He had been doing well until one week ago when he developed intermittent sharp pains in the right lower back, right flank, and RLQ of the abdomen. He has also seen red blood in his urine and his semen. No fever. No burning with urination or DC. His BM's are regular. He has a hx of passing kidney stones at home in the past, but he has never seen Urology. He saw Dr. Sagardia for this on 06-25-23, and he had labs that day including a UA, CBC, and CMET. A urine microalbumin/creatinine ratio was elevated. These were all normal except for some hematuria. A urine culture was obtained which was negative. He also had a CT renal stone study done on 06-26-23, but the results on this is still pending. His am fasting glucoses at home have averaged in the 120's and 130's. He stopped taking Glipizide  some months ago because it was causing his glucoses to drop too low. He has not had an eye exam for several years.    Review of Systems  Constitutional: Negative.   HENT: Negative.    Eyes: Negative.   Respiratory: Negative.    Cardiovascular: Negative.   Gastrointestinal:  Positive for abdominal pain. Negative for abdominal distention, blood in stool, constipation, diarrhea, nausea, rectal pain and vomiting.  Genitourinary:  Positive for flank pain and hematuria. Negative for difficulty urinating, dysuria, frequency, penile discharge, scrotal swelling, testicular pain and urgency.  Musculoskeletal:  Positive for back pain.  Skin: Negative.   Neurological: Negative.   Psychiatric/Behavioral: Negative.         Objective:   Physical Exam Constitutional:      General: He is not in acute distress.    Appearance: He is well-developed. He is obese. He is not diaphoretic.  HENT:     Head: Normocephalic and atraumatic.     Right Ear: External ear normal.     Left Ear: External ear normal.      Nose: Nose normal.     Mouth/Throat:     Pharynx: No oropharyngeal exudate.  Eyes:     General: No scleral icterus.       Right eye: No discharge.        Left eye: No discharge.     Conjunctiva/sclera: Conjunctivae normal.     Pupils: Pupils are equal, round, and reactive to light.  Neck:     Thyroid : No thyromegaly.     Vascular: No JVD.     Trachea: No tracheal deviation.  Cardiovascular:     Rate and Rhythm: Normal rate and regular rhythm.     Pulses: Normal pulses.     Heart sounds: Normal heart sounds. No murmur heard.    No friction rub. No gallop.  Pulmonary:     Effort: Pulmonary effort is normal. No respiratory distress.     Breath sounds: Normal breath sounds. No wheezing or rales.  Chest:     Chest wall: No tenderness.  Abdominal:     General: Bowel sounds are normal. There is no distension.     Palpations: Abdomen is soft. There is no mass.     Tenderness: There is no guarding or rebound.     Comments: He is tender in the right flank and the RLQ  Genitourinary:    Penis: Normal. No tenderness.      Testes:  Normal.     Prostate: Normal.     Rectum: Normal. Guaiac result negative.  Musculoskeletal:        General: No tenderness. Normal range of motion.     Cervical back: Neck supple.  Lymphadenopathy:     Cervical: No cervical adenopathy.  Skin:    General: Skin is warm and dry.     Coloration: Skin is not pale.     Findings: No erythema or rash.  Neurological:     General: No focal deficit present.     Mental Status: He is alert and oriented to person, place, and time.     Cranial Nerves: No cranial nerve deficit.     Motor: No abnormal muscle tone.     Coordination: Coordination normal.     Deep Tendon Reflexes: Reflexes are normal and symmetric. Reflexes normal.  Psychiatric:        Mood and Affect: Mood normal.        Behavior: Behavior normal.        Thought Content: Thought content normal.        Judgment: Judgment normal.            Assessment & Plan:  Well exam. We discussed diet and exercise. Get fasting labs uncluding an A1c. He very likely is dealing with a kidney stone right now, but we await the CT scan reading. He drinks lots of water every day. I urged him to get a diabetic eye exam asap. To help with his diabetes and his obesity, he will try Ozempic  0.25 mg weekly.  Corita Diego, MD

## 2023-07-12 ENCOUNTER — Encounter: Payer: Self-pay | Admitting: *Deleted

## 2023-07-15 ENCOUNTER — Other Ambulatory Visit: Payer: Self-pay | Admitting: Cardiology

## 2023-07-17 ENCOUNTER — Telehealth: Payer: Self-pay | Admitting: Cardiology

## 2023-07-17 MED ORDER — NITROGLYCERIN 0.4 MG SL SUBL: SUBLINGUAL_TABLET | SUBLINGUAL | 1 refills | Status: AC

## 2023-07-17 NOTE — Telephone Encounter (Signed)
 RX sent to requested Pharmacy

## 2023-07-17 NOTE — Telephone Encounter (Signed)
*  STAT* If patient is at the pharmacy, call can be transferred to refill team.   1. Which medications need to be refilled? (please list name of each medication and dose if known)   nitroGLYCERIN  (NITROSTAT ) 0.4 MG SL tablet     4. Which pharmacy/location (including street and city if local pharmacy) is medication to be sent to? [facility] - [place], [place] - [address]     5. Do they need a 30 day or 90 day supply? 90

## 2023-08-30 ENCOUNTER — Other Ambulatory Visit: Payer: Self-pay | Admitting: Family Medicine

## 2023-08-30 DIAGNOSIS — J4520 Mild intermittent asthma, uncomplicated: Secondary | ICD-10-CM

## 2023-08-30 MED ORDER — ALBUTEROL SULFATE HFA 108 (90 BASE) MCG/ACT IN AERS: 2.0000 | INHALATION_SPRAY | RESPIRATORY_TRACT | 0 refills | Status: AC | PRN

## 2023-08-30 NOTE — Telephone Encounter (Signed)
 Copied from CRM [id number]. Topic: Clinical - Medication Refill >> Aug 30, 2023 10:22 AM Jenice Mitts wrote: Medication: albuterol  (VENTOLIN  HFA) 108 (90 Base) MCG/ACT inhaler   Has the patient contacted their pharmacy? Yes (Agent: If no, request that the patient contact the pharmacy for the refill. If patient does not wish to contact the pharmacy document the reason why and proceed with request.) (Agent: If yes, when and what did the pharmacy advise?)  This is the patient's preferred pharmacy:  [facility] [address] [place], [place] - [address] [place] [address] Phone: [phone] Fax: [phone]  Is this the correct pharmacy for this prescription? Yes If no, delete pharmacy and type the correct one.   Has the prescription been filled recently? No  Is the patient out of the medication? Yes  Has the patient been seen for an appointment in the last year OR does the patient have an upcoming appointment? Yes  Can we respond through MyChart? Yes  Agent: Please be advised that Rx refills may take up to 3 business days. We ask that you follow-up with your pharmacy.

## 2023-09-11 NOTE — Progress Notes (Signed)
 [facility] Quality Team Note  Name: Raymond Lutz Date of Birth: [birth year] MRN: [id number] Date: 09/11/2023  [facility] Quality Team has reviewed this patient's chart, please see recommendations below:  [facility] Quality Other; (CHART REVIEWED. ABSTRACTED LABS FOR KIDNEY HEALTH EVALUATION IN DIABETICS GAP CLOSURE.)

## 2023-10-05 ENCOUNTER — Telehealth: Admitting: Family Medicine

## 2023-10-05 DIAGNOSIS — J019 Acute sinusitis, unspecified: Secondary | ICD-10-CM

## 2023-10-05 DIAGNOSIS — R509 Fever, unspecified: Secondary | ICD-10-CM

## 2023-10-05 DIAGNOSIS — R059 Cough, unspecified: Secondary | ICD-10-CM

## 2023-10-05 DIAGNOSIS — B9689 Other specified bacterial agents as the cause of diseases classified elsewhere: Secondary | ICD-10-CM | POA: Diagnosis not present

## 2023-10-05 MED ORDER — PROMETHAZINE-DM 6.25-15 MG/5ML PO SYRP: 5.0000 mL | ORAL_SOLUTION | Freq: Four times a day (QID) | ORAL | 0 refills | Status: AC | PRN

## 2023-10-05 MED ORDER — PREDNISONE 20 MG PO TABS: 20.0000 mg | ORAL_TABLET | Freq: Two times a day (BID) | ORAL | 0 refills | Status: AC

## 2023-10-05 MED ORDER — DOXYCYCLINE HYCLATE 100 MG PO TABS: 100.0000 mg | ORAL_TABLET | Freq: Two times a day (BID) | ORAL | 0 refills | Status: AC

## 2023-10-05 NOTE — Patient Instructions (Signed)

## 2023-10-05 NOTE — Progress Notes (Signed)
 Virtual Visit Consent   [name], you are scheduled for a virtual visit with a [facility] provider today. Just as with appointments in the office, your consent must be obtained to participate. Your consent will be active for this visit and any virtual visit you may have with one of our providers in the next 365 days. If you have a MyChart account, a copy of this consent can be sent to you electronically.  As this is a virtual visit, video technology does not allow for your provider to perform a traditional examination. This may limit your provider's ability to fully assess your condition. If your provider identifies any concerns that need to be evaluated in person or the need to arrange testing (such as labs, EKG, etc.), we will make arrangements to do so. Although advances in technology are sophisticated, we cannot ensure that it will always work on either your end or our end. If the connection with a video visit is poor, the visit may have to be switched to a telephone visit. With either a video or telephone visit, we are not always able to ensure that we have a secure connection.  By engaging in this virtual visit, you consent to the provision of healthcare and authorize for your insurance to be billed (if applicable) for the services provided during this visit. Depending on your insurance coverage, you may receive a charge related to this service.  I need to obtain your verbal consent now. Are you willing to proceed with your visit today? [name] has provided verbal consent on 10/05/2023 for a virtual visit (video or telephone). [name], FNP  Date: 10/05/2023 2:46 PM   Virtual Visit via Video Note   I, [name], connected with  [name]  ([id number], 1965/04/05) on 10/05/23 at  2:45 PM EDT by a video-enabled telemedicine application and verified that I am speaking with the correct person using two identifiers.  Location: Patient: Virtual Visit Location Patient:  Home Provider: Virtual Visit Location Provider: Home Office   I discussed the limitations of evaluation and management by telemedicine and the availability of in person appointments. The patient expressed understanding and agreed to proceed.    History of Present Illness: Raymond Lutz is a 58 y.o. who identifies as a male who was assigned male at birth, and is being seen today for fever, cough, green mucus, fatigued, neg covid, no chills or body aches, sinus pain and pressure, headache, wheezing using albuterol  and not sob, cough started several days ago, worsening today.    HPI: HPI  Problems:  Patient Active Problem List   Diagnosis Date Noted   Gross hematuria 06/25/2023   Flank pain 06/25/2023   History of kidney stones 06/25/2023   Chronic anticoagulation 06/25/2023   Lower abdominal pain 06/25/2023   Hematospermia 06/25/2023   History of diverticulitis 11/23/2022   Benign neoplasm of colon 11/23/2022   Atrial fibrillation (HCC)    Hypogonadism in male 03/24/2021   Situational anxiety 03/24/2021   FH: cerebral aneurysm 01/20/2021   Erectile dysfunction 01/20/2021   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/2019   Pure hypercholesterolemia    Stented coronary artery 09/15/2019   Arthritis 09/15/2019   Asthma 07/10/2018   Kidney stone on left side 12/27/2016   OSA (obstructive sleep apnea) 12/18/2015   NSTEMI (non-ST elevated myocardial infarction) (HCC) 12/16/2015   Carotid bruit 05/17/2011   CAD (coronary artery disease) 05/04/2011   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  GERD (gastroesophageal reflux disease)    Hyperlipidemia LDL goal <70 10/17/2006   Morbid obesity (HCC) 10/17/2006   Essential hypertension 10/17/2006    Allergies: No Known Allergies Medications:  Current Outpatient Medications:    albuterol  (VENTOLIN  HFA) 108 (90 Base) MCG/ACT inhaler, Inhale 2 puffs into the lungs every 4 (four) hours as needed for wheezing or shortness of breath.,  Disp: 18 g, Rfl: 0   amLODipine  (NORVASC ) 10 MG tablet, Take 1 tablet by mouth once daily, Disp: 90 tablet, Rfl: 0   apixaban  (ELIQUIS ) 5 MG TABS tablet, Take 1 tablet by mouth twice daily, Disp: 180 tablet, Rfl: 1   aspirin  EC 81 MG tablet, Take 1 tablet (81 mg total) by mouth daily., Disp: 90 tablet, Rfl: 3   atorvastatin  (LIPITOR ) 80 MG tablet, Take 1 tablet by mouth once daily, Disp: 90 tablet, Rfl: 1   budesonide (PULMICORT) 1 MG/2ML nebulizer solution, Take 1 mg by nebulization as needed (shortness of breath or wheezing)., Disp: , Rfl:    cetirizine  (ZYRTEC  ALLERGY) 10 MG tablet, Take 1 tablet (10 mg total) by mouth daily., Disp: 90 tablet, Rfl: 1   diclofenac Sodium (VOLTAREN) 1 % GEL, Apply 2 g topically daily as needed (pain)., Disp: , Rfl:    ezetimibe  (ZETIA ) 10 MG tablet, Take 1 tablet by mouth once daily, Disp: 90 tablet, Rfl: 3   fluticasone  (FLONASE ) 50 MCG/ACT nasal spray, Place 1 spray into both nostrils daily. (Patient taking differently: Place 1 spray into both nostrils daily as needed for allergies.), Disp: 16 g, Rfl: 0   furosemide  (LASIX ) 40 MG tablet, Take 1 tablet by mouth twice daily, Disp: 180 tablet, Rfl: 2   glucose blood (FREESTYLE LITE) test strip, Use to test blood glucose 3 times daily, Disp: 100 each, Rfl: 1   isosorbide  mononitrate (IMDUR ) 30 MG 24 hr tablet, Take 1 tablet by mouth once daily, Disp: 90 tablet, Rfl: 2   LORazepam  (ATIVAN ) 0.5 MG tablet, Take 1 tablet (0.5 mg total) by mouth every 8 (eight) hours as needed for anxiety., Disp: 60 tablet, Rfl: 2   losartan  (COZAAR ) 100 MG tablet, Take 1 tablet by mouth once daily, Disp: 90 tablet, Rfl: 3   metFORMIN  (GLUCOPHAGE -XR) 500 MG 24 hr tablet, Take 1 tablet (500 mg total) by mouth daily with breakfast., Disp: 90 tablet, Rfl: 3   metoprolol  tartrate (LOPRESSOR ) 50 MG tablet, Take 1 tablet by mouth twice daily, Disp: 180 tablet, Rfl: 3   nitroGLYCERIN  (NITROSTAT ) 0.4 MG SL tablet, DISSOLVE ONE TABLET UNDER THE  TONGUE EVERY 5 MINUTES AS NEEDED FOR CHEST PAIN.  DO NOT EXCEED A TOTAL OF 3 DOSES IN 15 MINUTES, Disp: 75 tablet, Rfl: 1   NON FORMULARY, Pt uses a c pap nightly, Disp: , Rfl:    Potassium Chloride  ER 20 MEQ TBCR, TAKE 2 TABLETS BY MOUTH IN THE MORNING, Disp: 180 tablet, Rfl: 1   scopolamine  (TRANSDERM SCOP , 1.5 MG,) 1 MG/3DAYS, Place 1 patch (1.5 mg total) onto the skin every 3 (three) days., Disp: 10 patch, Rfl: 2   Semaglutide ,0.25 or 0.5MG /DOS, (OZEMPIC , 0.25 OR 0.5 MG/DOSE,) 2 MG/3ML SOPN, Inject 0.25 mg into the skin once a week., Disp: 3 mL, Rfl: 2   tadalafil  (CIALIS ) 20 MG tablet, TAKE 1 TABLET BY MOUTH AS NEEDED FOR ERECTILE DYSFUNCTION, Disp: 10 tablet, Rfl: 11  Observations/Objective: Patient is well-developed, well-nourished in no acute distress.  Resting comfortably  at home.  Head is normocephalic, atraumatic.  No labored breathing.  Speech  is clear and coherent with logical content.  Patient is alert and oriented at baseline.    Assessment and Plan: 1. Fever, unspecified fever cause (Primary)  Increase fluids, humidifier at night, UC as needed. Recheck covid tomorrow. Hold antibiotic for 2-3 days and start if sx persist or worsen. Discussed viral illness.   Follow Up Instructions: I discussed the assessment and treatment plan with the patient. The patient was provided an opportunity to ask questions and all were answered. The patient agreed with the plan and demonstrated an understanding of the instructions.  A copy of instructions were sent to the patient via MyChart unless otherwise noted below.     The patient was advised to call back or seek an in-person evaluation if the symptoms worsen or if the condition fails to improve as anticipated.    Coila Wardell, FNP

## 2023-10-17 ENCOUNTER — Other Ambulatory Visit: Payer: Self-pay | Admitting: Cardiology

## 2023-10-30 ENCOUNTER — Other Ambulatory Visit: Payer: Self-pay | Admitting: Cardiology

## 2023-11-01 ENCOUNTER — Telehealth: Payer: Self-pay | Admitting: Cardiology

## 2023-11-01 MED ORDER — METOPROLOL TARTRATE 50 MG PO TABS: 50.0000 mg | ORAL_TABLET | Freq: Two times a day (BID) | ORAL | 0 refills | Status: DC

## 2023-11-01 MED ORDER — AMLODIPINE BESYLATE 10 MG PO TABS: 10.0000 mg | ORAL_TABLET | Freq: Every day | ORAL | 0 refills | Status: DC

## 2023-11-01 NOTE — Telephone Encounter (Signed)
*  STAT* If patient is at the pharmacy, call can be transferred to refill team.   1. Which medications need to be refilled? (please list name of each medication and dose if known) amLODipine  (NORVASC ) 10 MG tablet  metoprolol  tartrate (LOPRESSOR ) 50 MG tablet  2. Would you like to learn more about the convenience, safety, & potential cost savings by using the [facility]? No   3. Are you open to using the [facility] (Type [facility]. ) No   4. Which pharmacy/location (including street and city if local pharmacy) is medication to be sent to? [facility] - [place], [place] - [address]    5. Do they need a 30 day or 90 day supply? 90 day  Pt is out of medication and has scheduled appt on 11/13

## 2023-11-01 NOTE — Telephone Encounter (Signed)
 RX sent in

## 2023-11-06 ENCOUNTER — Other Ambulatory Visit: Payer: Self-pay | Admitting: Cardiology

## 2023-11-06 DIAGNOSIS — Z760 Encounter for issue of repeat prescription: Secondary | ICD-10-CM

## 2023-11-06 DIAGNOSIS — I10 Essential (primary) hypertension: Secondary | ICD-10-CM

## 2023-11-08 DIAGNOSIS — M170 Bilateral primary osteoarthritis of knee: Secondary | ICD-10-CM | POA: Diagnosis not present

## 2023-11-09 ENCOUNTER — Telehealth: Payer: Self-pay | Admitting: Cardiology

## 2023-11-09 DIAGNOSIS — Z760 Encounter for issue of repeat prescription: Secondary | ICD-10-CM

## 2023-11-09 DIAGNOSIS — I10 Essential (primary) hypertension: Secondary | ICD-10-CM

## 2023-11-09 MED ORDER — POTASSIUM CHLORIDE ER 20 MEQ PO TBCR: 40.0000 meq | EXTENDED_RELEASE_TABLET | Freq: Every morning | ORAL | 0 refills | Status: DC

## 2023-11-09 NOTE — Telephone Encounter (Signed)
 Pt's medication was sent to pt's pharmacy as requested. Confirmation received.

## 2023-11-09 NOTE — Telephone Encounter (Signed)
*  STAT* If patient is at the pharmacy, call can be transferred to refill team.   1. Which medications need to be refilled? (please list name of each medication and dose if known)   Potassium Chloride  ER 20 MEQ TBCR   2. Would you like to learn more about the convenience, safety, & potential cost savings by using the [facility]?   3. Are you open to using the [facility] (Type [facility]. ).  4. Which pharmacy/location (including street and city if local pharmacy) is medication to be sent to?  [facility] - [place], [place] - [address]   5. Do they need a 30 day or 90 day supply?   Patient stated he is almost out of this medication and will be going out of town.  Patient has appointment scheduled with Dr. Shlomo on 11/13.

## 2023-11-15 ENCOUNTER — Ambulatory Visit: Admitting: Cardiology

## 2023-11-20 ENCOUNTER — Other Ambulatory Visit: Payer: Self-pay | Admitting: Cardiology

## 2023-11-20 DIAGNOSIS — I480 Paroxysmal atrial fibrillation: Secondary | ICD-10-CM

## 2023-11-20 DIAGNOSIS — I10 Essential (primary) hypertension: Secondary | ICD-10-CM

## 2023-11-20 DIAGNOSIS — Z760 Encounter for issue of repeat prescription: Secondary | ICD-10-CM

## 2023-11-22 NOTE — Telephone Encounter (Signed)
 Prescription refill request for Eliquis  received. Indication:AFIB Last office visit:upcoming Scr:0.89  4/25 Age: 58 Weight:169.6  kg  Prescription refilled

## 2023-12-04 ENCOUNTER — Other Ambulatory Visit: Payer: Self-pay | Admitting: Cardiology

## 2023-12-11 ENCOUNTER — Other Ambulatory Visit: Payer: Self-pay | Admitting: Family Medicine

## 2023-12-16 ENCOUNTER — Other Ambulatory Visit: Payer: Self-pay | Admitting: Cardiology

## 2023-12-16 ENCOUNTER — Other Ambulatory Visit: Payer: Self-pay | Admitting: Family

## 2023-12-16 DIAGNOSIS — H66002 Acute suppurative otitis media without spontaneous rupture of ear drum, left ear: Secondary | ICD-10-CM

## 2023-12-16 DIAGNOSIS — J029 Acute pharyngitis, unspecified: Secondary | ICD-10-CM

## 2024-01-16 ENCOUNTER — Telehealth: Payer: Self-pay

## 2024-01-16 NOTE — Telephone Encounter (Signed)
 Raymond Lutz

## 2024-01-21 ENCOUNTER — Encounter

## 2024-01-21 ENCOUNTER — Telehealth: Admitting: Physician Assistant

## 2024-01-21 DIAGNOSIS — A084 Viral intestinal infection, unspecified: Secondary | ICD-10-CM

## 2024-01-21 MED ORDER — ONDANSETRON 4 MG PO TBDP: 4.0000 mg | ORAL_TABLET | Freq: Three times a day (TID) | ORAL | 0 refills | Status: AC | PRN

## 2024-01-21 MED ORDER — LOPERAMIDE HCL 2 MG PO TABS: 2.0000 mg | ORAL_TABLET | Freq: Four times a day (QID) | ORAL | 0 refills | Status: AC | PRN

## 2024-01-21 NOTE — Patient Instructions (Signed)
 Raymond Lutz, thank you for joining Raymond CHRISTELLA Dickinson, Raymond Lutz for today's virtual visit.  While this provider is not your primary care provider (PCP), if your PCP is located in our provider database this encounter information will be shared with them immediately following your visit.   [facility] MyChart account gives you access to today's visit and all your visits, tests, and labs performed at [facility]  click here if you don't have a  MyChart account or go to [email or web address]  Consent: (Patient) Raymond Lutz provided verbal consent for this virtual visit at the beginning of the encounter.  Current Medications:  Current Outpatient Medications:    loperamide (IMODIUM A-D) 2 MG tablet, Take 1 tablet (2 mg total) by mouth 4 (four) times daily as needed for diarrhea or loose stools., Disp: 30 tablet, Rfl: 0   ondansetron  (ZOFRAN -ODT) 4 MG disintegrating tablet, Take 1 tablet (4 mg total) by mouth every 8 (eight) hours as needed., Disp: 20 tablet, Rfl: 0   albuterol  (VENTOLIN  HFA) 108 (90 Base) MCG/ACT inhaler, Inhale 2 puffs into the lungs every 4 (four) hours as needed for wheezing or shortness of breath., Disp: 18 g, Rfl: 0   amLODipine  (NORVASC ) 10 MG tablet, Take 1 tablet (10 mg total) by mouth daily., Disp: 90 tablet, Rfl: 0   apixaban  (ELIQUIS ) 5 MG TABS tablet, Take 1 tablet by mouth twice daily, Disp: 180 tablet, Rfl: 1   aspirin  EC 81 MG tablet, Take 1 tablet (81 mg total) by mouth daily., Disp: 90 tablet, Rfl: 3   atorvastatin  (LIPITOR ) 80 MG tablet, Take 1 tablet by mouth once daily, Disp: 90 tablet, Rfl: 0   budesonide (PULMICORT) 1 MG/2ML nebulizer solution, Take 1 mg by nebulization as needed (shortness of breath or wheezing)., Disp: , Rfl:    cetirizine  (ZYRTEC  ALLERGY) 10 MG tablet, Take 1 tablet (10 mg total) by mouth daily., Disp: 90 tablet, Rfl: 1   diclofenac Sodium (VOLTAREN) 1 % GEL, Apply 2 g topically daily as needed (pain).,  Disp: , Rfl:    ezetimibe  (ZETIA ) 10 MG tablet, Take 1 tablet by mouth once daily, Disp: 90 tablet, Rfl: 0   fluticasone  (FLONASE ) 50 MCG/ACT nasal spray, Place 1 spray into both nostrils daily. (Patient taking differently: Place 1 spray into both nostrils daily as needed for allergies.), Disp: 16 g, Rfl: 0   furosemide  (LASIX ) 40 MG tablet, Take 1 tablet by mouth twice daily, Disp: 180 tablet, Rfl: 0   glucose blood (FREESTYLE LITE) test strip, Use to test blood glucose 3 times daily, Disp: 100 each, Rfl: 1   isosorbide  mononitrate (IMDUR ) 30 MG 24 hr tablet, Take 1 tablet by mouth once daily, Disp: 90 tablet, Rfl: 0   LORazepam  (ATIVAN ) 0.5 MG tablet, Take 1 tablet (0.5 mg total) by mouth every 8 (eight) hours as needed for anxiety., Disp: 60 tablet, Rfl: 2   losartan  (COZAAR ) 100 MG tablet, Take 1 tablet (100 mg total) by mouth daily., Disp: 90 tablet, Rfl: 0   metFORMIN  (GLUCOPHAGE -XR) 500 MG 24 hr tablet, Take 1 tablet (500 mg total) by mouth daily with breakfast., Disp: 90 tablet, Rfl: 3   metoprolol  tartrate (LOPRESSOR ) 50 MG tablet, Take 1 tablet (50 mg total) by mouth 2 (two) times daily., Disp: 180 tablet, Rfl: 0   nitroGLYCERIN  (NITROSTAT ) 0.4 MG SL tablet, DISSOLVE ONE TABLET UNDER THE TONGUE EVERY 5 MINUTES AS NEEDED FOR CHEST PAIN.  DO NOT EXCEED A TOTAL OF 3 DOSES IN  15 MINUTES, Disp: 75 tablet, Rfl: 1   NON FORMULARY, Pt uses a c pap nightly, Disp: , Rfl:    Potassium Chloride  ER 20 MEQ TBCR, Take 2 tablets (40 mEq total) by mouth every morning., Disp: 180 tablet, Rfl: 0   scopolamine  (TRANSDERM SCOP , 1.5 MG,) 1 MG/3DAYS, Place 1 patch (1.5 mg total) onto the skin every 3 (three) days., Disp: 10 patch, Rfl: 2   Semaglutide ,0.25 or 0.5MG /DOS, (OZEMPIC , 0.25 OR 0.5 MG/DOSE,) 2 MG/3ML SOPN, INJECT 0.25 MG INTO THE SKIN ONCE A WEEK, Disp: 3 mL, Rfl: 5   tadalafil  (CIALIS ) 20 MG tablet, TAKE 1 TABLET BY MOUTH AS NEEDED FOR ERECTILE DYSFUNCTION, Disp: 10 tablet, Rfl: 11   Medications  ordered in this encounter:  Meds ordered this encounter  Medications   ondansetron  (ZOFRAN -ODT) 4 MG disintegrating tablet    Sig: Take 1 tablet (4 mg total) by mouth every 8 (eight) hours as needed.    Dispense:  20 tablet    Refill:  0    Supervising Provider:   LAMPTEY, PHILIP O [[id number]]   loperamide (IMODIUM A-D) 2 MG tablet    Sig: Take 1 tablet (2 mg total) by mouth 4 (four) times daily as needed for diarrhea or loose stools.    Dispense:  30 tablet    Refill:  0    Supervising Provider:   LAMPTEY, PHILIP O [[id number]]     *If you need refills on other medications prior to your next appointment, please contact your pharmacy*  Follow-Up: Call back or seek an in-person evaluation if the symptoms worsen or if the condition fails to improve as anticipated.  [facility] [phone]  Other Instructions Viral Gastroenteritis, Adult  Viral gastroenteritis is also known as the stomach flu. This condition may affect your stomach, small intestine, and large intestine. It can cause sudden watery diarrhea, fever, and vomiting. This condition is caused by many different viruses. These viruses can be passed from person to person very easily (are contagious). Diarrhea and vomiting can make you feel weak and cause you to become dehydrated. You may not be able to keep fluids down. Dehydration can make you tired and thirsty, cause you to have a dry mouth, and decrease how often you urinate. It is important to replace the fluids that you lose from diarrhea and vomiting. What are the causes? Gastroenteritis is caused by many viruses, including rotavirus and norovirus. Norovirus is the most common cause in adults. You can get sick after being exposed to the viruses from other people. You can also get sick by: Eating food, drinking water, or touching a surface contaminated with one of these viruses. Sharing utensils or other personal items with an infected person. What increases the  risk? You are more likely to develop this condition if you: Have a weak body defense system (immune system). Live with one or more children who are younger than 2 years. Live in a nursing home. Travel on cruise ships. What are the signs or symptoms? Symptoms of this condition start suddenly 1-3 days after exposure to a virus. Symptoms may last for a few days or for as long as a week. Common symptoms include watery diarrhea and vomiting. Other symptoms include: Fever. Headache. Fatigue. Pain in the abdomen. Chills. Weakness. Nausea. Muscle aches. Loss of appetite. How is this diagnosed? This condition is diagnosed with a medical history and physical exam. You may also have a stool test to check for viruses or other infections. How  is this treated? This condition typically goes away on its own. The focus of treatment is to prevent dehydration and restore lost fluids (rehydration). This condition may be treated with: An oral rehydration solution (ORS) to replace important salts and minerals (electrolytes) in your body. Take this if told by your health care provider. This is a drink that is sold at pharmacies and retail stores. Medicines to help with your symptoms. Probiotic supplements to reduce symptoms of diarrhea. Fluids given through an IV, if dehydration is severe. Older adults and people with other diseases or a weak immune system are at higher risk for dehydration. Follow these instructions at home: Eating and drinking  Take an ORS as told by your health care provider. Drink clear fluids in small amounts as you are able. Clear fluids include: Water. Ice chips. Diluted fruit juice. Low-calorie sports drinks. Drink enough fluid to keep your urine pale yellow. Eat small amounts of healthy foods every 3-4 hours as you are able. This may include whole grains, fruits, vegetables, lean meats, and yogurt. Avoid fluids that contain a lot of sugar or caffeine, such as energy drinks,  sports drinks, and soda. Avoid spicy or fatty foods. Avoid alcohol. General instructions  Wash your hands often, especially after having diarrhea or vomiting. If soap and water are not available, use hand sanitizer. Make sure that all people in your household wash their hands well and often. Take over-the-counter and prescription medicines only as told by your health care provider. Rest at home while you recover. Watch your condition for any changes. Take a warm bath to relieve any burning or pain from frequent diarrhea episodes. Keep all follow-up visits. This is important. Contact a health care provider if you: Cannot keep fluids down. Have symptoms that get worse. Have new symptoms. Feel light-headed or dizzy. Have muscle cramps. Get help right away if you: Have chest pain. Have trouble breathing or you are breathing very quickly. Have a fast heartbeat. Feel extremely weak or you faint. Have a severe headache, a stiff neck, or both. Have a rash. Have severe pain, cramping, or bloating in your abdomen. Have skin that feels cold and clammy. Feel confused. Have pain when you urinate. Have signs of dehydration, such as: Dark urine, very little urine, or no urine. Cracked lips. Dry mouth. Sunken eyes. Sleepiness. Weakness. Have signs of bleeding, such as: Seeing blood in your vomit. Having vomit that looks like coffee grounds. Having bloody or black stools or stools that look like tar. These symptoms may be an emergency. Get help right away. Call 911. Do not wait to see if the symptoms will go away. Do not drive yourself to the hospital. Summary Viral gastroenteritis is also known as the stomach flu. It can cause sudden watery diarrhea, fever, and vomiting. This condition can be passed from person to person very easily (is contagious). Take an oral rehydration solution (ORS) if told by your health care provider. This is a drink that is sold at pharmacies and retail  stores. Wash your hands often, especially after having diarrhea or vomiting. If soap and water are not available, use hand sanitizer. This information is not intended to replace advice given to you by your health care provider. Make sure you discuss any questions you have with your health care provider. Document Revised: 12/27/2020 Document Reviewed: 12/27/2020 Elsevier Patient Education  2024 [employer].   If you have been instructed to have an in-person evaluation today at a local Urgent Care facility, please use  the link below. It will take you to a list of all of our available [facility], including address, phone number and hours of operation. Please do not delay care.  [facility]  If you or a family member do not have a primary care provider, use the link below to schedule a visit and establish care. When you choose a [facility] primary care physician or advanced practice provider, you gain a long-term partner in health. Find a Primary Care Provider  Learn more about [facility] in-office and virtual care options: [facility] - Get Care Now

## 2024-01-21 NOTE — Progress Notes (Signed)
 Virtual Visit Consent   Raymond Lutz, you are scheduled for a virtual visit with a [facility] provider today. Just as with appointments in the office, your consent must be obtained to participate. Your consent will be active for this visit and any virtual visit you may have with one of our providers in the next 365 days. If you have a MyChart account, a copy of this consent can be sent to you electronically.  As this is a virtual visit, video technology does not allow for your provider to perform a traditional examination. This may limit your provider's ability to fully assess your condition. If your provider identifies any concerns that need to be evaluated in person or the need to arrange testing (such as labs, EKG, etc.), we will make arrangements to do so. Although advances in technology are sophisticated, we cannot ensure that it will always work on either your end or our end. If the connection with a video visit is poor, the visit may have to be switched to a telephone visit. With either a video or telephone visit, we are not always able to ensure that we have a secure connection.  By engaging in this virtual visit, you consent to the provision of healthcare and authorize for your insurance to be billed (if applicable) for the services provided during this visit. Depending on your insurance coverage, you may receive a charge related to this service.  I need to obtain your verbal consent now. Are you willing to proceed with your visit today? Raymond Lutz has provided verbal consent on 01/21/2024 for a virtual visit (video or telephone). Raymond CHRISTELLA Dickinson, PA-C  Date: 01/21/2024 9:36 AM   Virtual Visit via Video Note   I, Raymond Lutz, connected with  Raymond Lutz  ([id number], [birth year]) on 01/21/24 at  9:30 AM EST by a video-enabled telemedicine application and verified that I am speaking with the correct person using two identifiers.  Location: Patient: Virtual Visit  Location Patient: Home Provider: Virtual Visit Location Provider: Home Office   I discussed the limitations of evaluation and management by telemedicine and the availability of in person appointments. The patient expressed understanding and agreed to proceed.    History of Present Illness: Raymond Lutz is a 58 y.o. who identifies as a male who was assigned male at birth, and is being seen today for nausea, vomiting, diarrhea.  HPI: Emesis  This is a new problem. The current episode started today (overnight). The problem occurs 2 to 4 times per day. The problem has been unchanged. The emesis has an appearance of stomach contents. There has been no fever. The fever has been present for Less than 1 day. Associated symptoms include chills and diarrhea (3-4 times). Pertinent negatives include no abdominal pain, dizziness, fever, headaches, myalgias, sweats or weight loss. Associated symptoms comments: nausea. 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 He has tried increased fluids for the symptoms. The treatment provided no relief.  At home covid testing was negative    Problems:  Patient Active Problem List   Diagnosis Date Noted   Gross hematuria 06/25/2023   Flank pain 06/25/2023   History of kidney stones 06/25/2023   Chronic anticoagulation 06/25/2023   Lower abdominal pain 06/25/2023   Hematospermia 06/25/2023   History of diverticulitis 11/23/2022   Benign neoplasm of colon 11/23/2022   Atrial fibrillation (HCC)    Hypogonadism in male 03/24/2021   Situational anxiety 03/24/2021   FH: cerebral aneurysm 01/20/2021   Erectile  dysfunction 01/20/2021   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/2019   Pure hypercholesterolemia    Stented coronary artery 09/15/2019   Arthritis 09/15/2019   Asthma 07/10/2018   Kidney stone on left side 12/27/2016   OSA (obstructive sleep apnea) 12/18/2015   NSTEMI (non-ST elevated myocardial infarction) (HCC) 12/16/2015   Carotid  bruit 05/17/2011   CAD (coronary artery disease) 05/04/2011   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   GERD (gastroesophageal reflux disease)    Hyperlipidemia LDL goal <70 10/17/2006   Morbid obesity (HCC) 10/17/2006   Essential hypertension 10/17/2006    Allergies: No Known Allergies Medications:  Current Outpatient Medications:    loperamide (IMODIUM A-D) 2 MG tablet, Take 1 tablet (2 mg total) by mouth 4 (four) times daily as needed for diarrhea or loose stools., Disp: 30 tablet, Rfl: 0   ondansetron  (ZOFRAN -ODT) 4 MG disintegrating tablet, Take 1 tablet (4 mg total) by mouth every 8 (eight) hours as needed., Disp: 20 tablet, Rfl: 0   albuterol  (VENTOLIN  HFA) 108 (90 Base) MCG/ACT inhaler, Inhale 2 puffs into the lungs every 4 (four) hours as needed for wheezing or shortness of breath., Disp: 18 g, Rfl: 0   amLODipine  (NORVASC ) 10 MG tablet, Take 1 tablet (10 mg total) by mouth daily., Disp: 90 tablet, Rfl: 0   apixaban  (ELIQUIS ) 5 MG TABS tablet, Take 1 tablet by mouth twice daily, Disp: 180 tablet, Rfl: 1   aspirin  EC 81 MG tablet, Take 1 tablet (81 mg total) by mouth daily., Disp: 90 tablet, Rfl: 3   atorvastatin  (LIPITOR ) 80 MG tablet, Take 1 tablet by mouth once daily, Disp: 90 tablet, Rfl: 0   budesonide (PULMICORT) 1 MG/2ML nebulizer solution, Take 1 mg by nebulization as needed (shortness of breath or wheezing)., Disp: , Rfl:    cetirizine  (ZYRTEC  ALLERGY) 10 MG tablet, Take 1 tablet (10 mg total) by mouth daily., Disp: 90 tablet, Rfl: 1   diclofenac Sodium (VOLTAREN) 1 % GEL, Apply 2 g topically daily as needed (pain)., Disp: , Rfl:    ezetimibe  (ZETIA ) 10 MG tablet, Take 1 tablet by mouth once daily, Disp: 90 tablet, Rfl: 0   fluticasone  (FLONASE ) 50 MCG/ACT nasal spray, Place 1 spray into both nostrils daily. (Patient taking differently: Place 1 spray into both nostrils daily as needed for allergies.), Disp: 16 g, Rfl: 0   furosemide  (LASIX ) 40 MG tablet, Take 1 tablet by mouth twice daily, Disp:  180 tablet, Rfl: 0   glucose blood (FREESTYLE LITE) test strip, Use to test blood glucose 3 times daily, Disp: 100 each, Rfl: 1   isosorbide  mononitrate (IMDUR ) 30 MG 24 hr tablet, Take 1 tablet by mouth once daily, Disp: 90 tablet, Rfl: 0   LORazepam  (ATIVAN ) 0.5 MG tablet, Take 1 tablet (0.5 mg total) by mouth every 8 (eight) hours as needed for anxiety., Disp: 60 tablet, Rfl: 2   losartan  (COZAAR ) 100 MG tablet, Take 1 tablet (100 mg total) by mouth daily., Disp: 90 tablet, Rfl: 0   metFORMIN  (GLUCOPHAGE -XR) 500 MG 24 hr tablet, Take 1 tablet (500 mg total) by mouth daily with breakfast., Disp: 90 tablet, Rfl: 3   metoprolol  tartrate (LOPRESSOR ) 50 MG tablet, Take 1 tablet (50 mg total) by mouth 2 (two) times daily., Disp: 180 tablet, Rfl: 0   nitroGLYCERIN  (NITROSTAT ) 0.4 MG SL tablet, DISSOLVE ONE TABLET UNDER THE TONGUE EVERY 5 MINUTES AS NEEDED FOR CHEST PAIN.  DO NOT EXCEED A TOTAL OF 3 DOSES IN 15 MINUTES,  Disp: 75 tablet, Rfl: 1   NON FORMULARY, Pt uses a c pap nightly, Disp: , Rfl:    Potassium Chloride  ER 20 MEQ TBCR, Take 2 tablets (40 mEq total) by mouth every morning., Disp: 180 tablet, Rfl: 0   scopolamine  (TRANSDERM SCOP , 1.5 MG,) 1 MG/3DAYS, Place 1 patch (1.5 mg total) onto the skin every 3 (three) days., Disp: 10 patch, Rfl: 2   Semaglutide ,0.25 or 0.5MG /DOS, (OZEMPIC , 0.25 OR 0.5 MG/DOSE,) 2 MG/3ML SOPN, INJECT 0.25 MG INTO THE SKIN ONCE A WEEK, Disp: 3 mL, Rfl: 5   tadalafil  (CIALIS ) 20 MG tablet, TAKE 1 TABLET BY MOUTH AS NEEDED FOR ERECTILE DYSFUNCTION, Disp: 10 tablet, Rfl: 11  Observations/Objective: Patient is well-developed, well-nourished in no acute distress.  Resting comfortably at home.  Head is normocephalic, atraumatic.  No labored breathing.  Speech is clear and coherent with logical content.  Patient is alert and oriented at baseline.    Assessment and Plan: 1. Viral gastroenteritis (Primary) - ondansetron  (ZOFRAN -ODT) 4 MG disintegrating tablet; Take 1  tablet (4 mg total) by mouth every 8 (eight) hours as needed.  Dispense: 20 tablet; Refill: 0 - loperamide (IMODIUM A-D) 2 MG tablet; Take 1 tablet (2 mg total) by mouth 4 (four) times daily as needed for diarrhea or loose stools.  Dispense: 30 tablet; Refill: 0  - Suspect viral gastroenteritis - Zofran  for nausea - Imodium for diarrhea - Push fluids, electrolyte beverages - Liquid diet, then increase to soft/bland (BRAT) diet over next day, then increase diet as tolerated - Seek in person evaluation if not improving or symptoms worsen   Follow Up Instructions: I discussed the assessment and treatment plan with the patient. The patient was provided an opportunity to ask questions and all were answered. The patient agreed with the plan and demonstrated an understanding of the instructions.  A copy of instructions were sent to the patient via MyChart unless otherwise noted below.    The patient was advised to call back or seek an in-person evaluation if the symptoms worsen or if the condition fails to improve as anticipated.    Raymond CHRISTELLA Dickinson, PA-C

## 2024-01-24 ENCOUNTER — Ambulatory Visit: Attending: Cardiology | Admitting: Cardiology

## 2024-01-24 ENCOUNTER — Encounter: Payer: Self-pay | Admitting: Cardiology

## 2024-01-24 VITALS — BP 148/80 | HR 58 | Ht 73.0 in | Wt 370.4 lb

## 2024-01-24 DIAGNOSIS — G4733 Obstructive sleep apnea (adult) (pediatric): Secondary | ICD-10-CM | POA: Diagnosis not present

## 2024-01-24 DIAGNOSIS — I480 Paroxysmal atrial fibrillation: Secondary | ICD-10-CM | POA: Diagnosis not present

## 2024-01-24 DIAGNOSIS — E785 Hyperlipidemia, unspecified: Secondary | ICD-10-CM | POA: Diagnosis not present

## 2024-01-24 DIAGNOSIS — I2510 Atherosclerotic heart disease of native coronary artery without angina pectoris: Secondary | ICD-10-CM

## 2024-01-24 DIAGNOSIS — R600 Localized edema: Secondary | ICD-10-CM

## 2024-01-24 DIAGNOSIS — Z79899 Other long term (current) drug therapy: Secondary | ICD-10-CM | POA: Diagnosis not present

## 2024-01-24 DIAGNOSIS — I10 Essential (primary) hypertension: Secondary | ICD-10-CM

## 2024-01-24 LAB — BASIC METABOLIC PANEL WITH GFR
BUN/Creatinine Ratio: 19 (ref 9–20)
BUN: 16 mg/dL (ref 6–24)
CO2: 22 mmol/L (ref 20–29)
Calcium: 9.5 mg/dL (ref 8.7–10.2)
Chloride: 105 mmol/L (ref 96–106)
Creatinine, Ser: 0.85 mg/dL (ref 0.76–1.27)
Glucose: 121 mg/dL — ABNORMAL HIGH (ref 70–99)
Potassium: 4.3 mmol/L (ref 3.5–5.2)
Sodium: 140 mmol/L (ref 134–144)
eGFR: 102 mL/min/1.73 (ref >59)

## 2024-01-24 LAB — CBC WITH DIFFERENTIAL/PLATELET
Basophils Absolute: 0.1 x10E3/uL (ref 0.0–0.2)
Basos: 1 %
EOS (ABSOLUTE): 0.3 x10E3/uL (ref 0.0–0.4)
Eos: 6 %
Hematocrit: 39.9 % (ref 37.5–51.0)
Hemoglobin: 12.7 g/dL — ABNORMAL LOW (ref 13.0–17.7)
Immature Grans (Abs): 0 x10E3/uL (ref 0.0–0.1)
Immature Granulocytes: 0 %
Lymphocytes Absolute: 1.6 x10E3/uL (ref 0.7–3.1)
Lymphs: 39 %
MCH: 27.5 pg (ref 26.6–33.0)
MCHC: 31.8 g/dL (ref 31.5–35.7)
MCV: 86 fL (ref 79–97)
Monocytes Absolute: 0.5 x10E3/uL (ref 0.1–0.9)
Monocytes: 11 %
Neutrophils Absolute: 1.8 x10E3/uL (ref 1.4–7.0)
Neutrophils: 43 %
Platelets: 260 x10E3/uL (ref 150–450)
RBC: 4.62 x10E6/uL (ref 4.14–5.80)
RDW: 13.7 % (ref 11.6–15.4)
WBC: 4.2 x10E3/uL (ref 3.4–10.8)

## 2024-01-24 MED ORDER — METOPROLOL TARTRATE 50 MG PO TABS: 50.0000 mg | ORAL_TABLET | Freq: Two times a day (BID) | ORAL | 3 refills | Status: AC

## 2024-01-24 NOTE — Addendum Note (Signed)
 Addended by: JANIT GENI CROME on: 01/24/2024 09:22 AM   Modules accepted: Orders
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
## 2024-01-24 NOTE — Addendum Note (Signed)
 Addended by: JANIT GENI CROME on: 01/24/2024 09:28 AM   Modules accepted: Orders

## 2024-01-24 NOTE — Patient Instructions (Signed)
 Medication Instructions:  Your physician recommends that you continue on your current medications as directed. Please refer to the Current Medication list given to you today.  *If you need a refill on your cardiac medications before your next appointment, please call your pharmacy*  Lab Work: Please complete a CBC and a BMET in our first floor lab before you leave today.  If you have labs (blood work) drawn today and your tests are completely normal, you will receive your results only by: MyChart Message (if you have MyChart) OR A paper copy in the mail If you have any lab test that is abnormal or we need to change your treatment, we will call you to review the results.  Testing/Procedures: None.  Follow-Up: At [facility], you and your health needs are our priority.  As part of our continuing mission to provide you with exceptional heart care, our providers are all part of one team.  This team includes your primary Cardiologist (physician) and Advanced Practice Providers or APPs (Physician Assistants and Nurse Practitioners) who all work together to provide you with the care you need, when you need it.  Your next appointment:   1 year(s)  Provider:   Wilbert Bihari, MD     Other Instructions Please check your blood pressure twice a day for one week. Check your blood pressure once at lunch and once at dinner. Write down your blood pressure readings along with the date and time of each reading, as well as a heart rate if your machine provides that information. Then, drop off your readings at our front desk, send a picture of the log over Mychart or send the readings to us  in the mail. You can also call our main phone number [phone]) as all of our operators are trained to take down your blood pressure readings.

## 2024-01-25 ENCOUNTER — Ambulatory Visit: Payer: Self-pay | Admitting: Cardiology

## 2024-02-04 ENCOUNTER — Other Ambulatory Visit: Payer: Self-pay | Admitting: Cardiology

## 2024-02-16 ENCOUNTER — Other Ambulatory Visit: Payer: Self-pay | Admitting: Cardiology

## 2024-02-16 DIAGNOSIS — Z760 Encounter for issue of repeat prescription: Secondary | ICD-10-CM

## 2024-02-16 DIAGNOSIS — I10 Essential (primary) hypertension: Secondary | ICD-10-CM

## 2024-02-17 ENCOUNTER — Telehealth

## 2024-02-17 DIAGNOSIS — J208 Acute bronchitis due to other specified organisms: Secondary | ICD-10-CM | POA: Diagnosis not present

## 2024-02-17 DIAGNOSIS — B9689 Other specified bacterial agents as the cause of diseases classified elsewhere: Secondary | ICD-10-CM | POA: Diagnosis not present

## 2024-02-17 MED ORDER — PREDNISONE 20 MG PO TABS: ORAL_TABLET | ORAL | 0 refills | Status: AC

## 2024-02-17 MED ORDER — BENZONATATE 100 MG PO CAPS: ORAL_CAPSULE | ORAL | 0 refills | Status: AC

## 2024-02-17 MED ORDER — AZITHROMYCIN 250 MG PO TABS: ORAL_TABLET | ORAL | 0 refills | Status: AC

## 2024-02-17 NOTE — Patient Instructions (Signed)
 Raymond Lutz, thank you for joining Kirk GORMAN Sage, PA-C for today's virtual visit.  While this provider is not your primary care provider (PCP), if your PCP is located in our provider database this encounter information will be shared with them immediately following your visit.   A [facility] MyChart account gives you access to today's visit and all your visits, tests, and labs performed at [facility]  click here if you don't have a [place] MyChart account or go to [email or web address]  Consent: (Patient) Raymond Lutz provided verbal consent for this virtual visit at the beginning of the encounter.  Current Medications:  Current Outpatient Medications:    azithromycin  (ZITHROMAX ) 250 MG tablet, Take 2 tablets on day 1, then 1 tablet daily on days 2 through 5, Disp: 6 tablet, Rfl: 0   benzonatate  (TESSALON ) 100 MG capsule, Take 1-2 caps PO TID PRN, Disp: 20 capsule, Rfl: 0   predniSONE  (DELTASONE ) 20 MG tablet, Take 3 tablets (60 mg total) by mouth daily with breakfast for 2 days, THEN 2 tablets (40 mg total) daily with breakfast for 2 days, THEN 1 tablet (20 mg total) daily with breakfast for 2 days, THEN 0.5 tablets (10 mg total) daily with breakfast for 2 days., Disp: 13 tablet, Rfl: 0   albuterol  (VENTOLIN  HFA) 108 (90 Base) MCG/ACT inhaler, Inhale 2 puffs into the lungs every 4 (four) hours as needed for wheezing or shortness of breath., Disp: 18 g, Rfl: 0   amLODipine  (NORVASC ) 10 MG tablet, Take 1 tablet (10 mg total) by mouth daily., Disp: 90 tablet, Rfl: 3   apixaban  (ELIQUIS ) 5 MG TABS tablet, Take 1 tablet by mouth twice daily, Disp: 180 tablet, Rfl: 1   aspirin  EC 81 MG tablet, Take 1 tablet (81 mg total) by mouth daily., Disp: 90 tablet, Rfl: 3   atorvastatin  (LIPITOR ) 80 MG tablet, Take 1 tablet by mouth once daily, Disp: 90 tablet, Rfl: 0   budesonide (PULMICORT) 1 MG/2ML nebulizer solution, Take 1 mg by nebulization as needed (shortness of breath or  wheezing)., Disp: , Rfl:    cetirizine  (ZYRTEC  ALLERGY) 10 MG tablet, Take 1 tablet (10 mg total) by mouth daily., Disp: 90 tablet, Rfl: 1   diclofenac Sodium (VOLTAREN) 1 % GEL, Apply 2 g topically daily as needed (pain)., Disp: , Rfl:    ezetimibe  (ZETIA ) 10 MG tablet, Take 1 tablet by mouth once daily, Disp: 90 tablet, Rfl: 0   fluticasone  (FLONASE ) 50 MCG/ACT nasal spray, Place 1 spray into both nostrils daily. (Patient taking differently: Place 1 spray into both nostrils daily as needed for allergies.), Disp: 16 g, Rfl: 0   furosemide  (LASIX ) 40 MG tablet, Take 1 tablet by mouth twice daily, Disp: 180 tablet, Rfl: 0   glucose blood (FREESTYLE LITE) test strip, Use to test blood glucose 3 times daily, Disp: 100 each, Rfl: 1   isosorbide  mononitrate (IMDUR ) 30 MG 24 hr tablet, Take 1 tablet by mouth once daily, Disp: 90 tablet, Rfl: 0   loperamide  (IMODIUM  A-D) 2 MG tablet, Take 1 tablet (2 mg total) by mouth 4 (four) times daily as needed for diarrhea or loose stools., Disp: 30 tablet, Rfl: 0   LORazepam  (ATIVAN ) 0.5 MG tablet, Take 1 tablet (0.5 mg total) by mouth every 8 (eight) hours as needed for anxiety., Disp: 60 tablet, Rfl: 2   losartan  (COZAAR ) 100 MG tablet, Take 1 tablet (100 mg total) by mouth daily., Disp: 90 tablet, Rfl: 0  metFORMIN  (GLUCOPHAGE -XR) 500 MG 24 hr tablet, Take 1 tablet (500 mg total) by mouth daily with breakfast., Disp: 90 tablet, Rfl: 3   metoprolol  tartrate (LOPRESSOR ) 50 MG tablet, Take 1 tablet (50 mg total) by mouth 2 (two) times daily., Disp: 180 tablet, Rfl: 3   nitroGLYCERIN  (NITROSTAT ) 0.4 MG SL tablet, DISSOLVE ONE TABLET UNDER THE TONGUE EVERY 5 MINUTES AS NEEDED FOR CHEST PAIN.  DO NOT EXCEED A TOTAL OF 3 DOSES IN 15 MINUTES, Disp: 75 tablet, Rfl: 1   NON FORMULARY, Pt uses a c pap nightly, Disp: , Rfl:    ondansetron  (ZOFRAN -ODT) 4 MG disintegrating tablet, Take 1 tablet (4 mg total) by mouth every 8 (eight) hours as needed., Disp: 20 tablet, Rfl: 0    Potassium Chloride  ER 20 MEQ TBCR, Take 2 tablets (40 mEq total) by mouth every morning., Disp: 180 tablet, Rfl: 0   scopolamine  (TRANSDERM SCOP , 1.5 MG,) 1 MG/3DAYS, Place 1 patch (1.5 mg total) onto the skin every 3 (three) days., Disp: 10 patch, Rfl: 2   Semaglutide ,0.25 or 0.5MG /DOS, (OZEMPIC , 0.25 OR 0.5 MG/DOSE,) 2 MG/3ML SOPN, INJECT 0.25 MG INTO THE SKIN ONCE A WEEK, Disp: 3 mL, Rfl: 5   tadalafil  (CIALIS ) 20 MG tablet, TAKE 1 TABLET BY MOUTH AS NEEDED FOR ERECTILE DYSFUNCTION, Disp: 10 tablet, Rfl: 11   Medications ordered in this encounter:  Meds ordered this encounter  Medications   azithromycin  (ZITHROMAX ) 250 MG tablet    Sig: Take 2 tablets on day 1, then 1 tablet daily on days 2 through 5    Dispense:  6 tablet    Refill:  0    Supervising Provider:   LAMPTEY, PHILIP O [[id number]]   predniSONE  (DELTASONE ) 20 MG tablet    Sig: Take 3 tablets (60 mg total) by mouth daily with breakfast for 2 days, THEN 2 tablets (40 mg total) daily with breakfast for 2 days, THEN 1 tablet (20 mg total) daily with breakfast for 2 days, THEN 0.5 tablets (10 mg total) daily with breakfast for 2 days.    Dispense:  13 tablet    Refill:  0    Supervising Provider:   LAMPTEY, PHILIP O [[id number]]   benzonatate  (TESSALON ) 100 MG capsule    Sig: Take 1-2 caps PO TID PRN    Dispense:  20 capsule    Refill:  0    Supervising Provider:   LAMPTEY, PHILIP O [[id number]]     *If you need refills on other medications prior to your next appointment, please contact your pharmacy*  Follow-Up: Call back or seek an in-person evaluation if the symptoms worsen or if the condition fails to improve as anticipated.  [facility] [phone]  Other Instructions Upper Respiratory Infection, Adult An upper respiratory infection (URI) is a common viral infection of the nose, throat, and upper air passages that lead to the lungs. The most common type of URI is the common cold. URIs usually get better on  their own, without medical treatment. What are the causes? A URI is caused by a virus. You may catch a virus by: Breathing in droplets from an infected person's cough or sneeze. Touching something that has been exposed to the virus (is contaminated) and then touching your mouth, nose, or eyes. What increases the risk? You are more likely to get a URI if: You are very young or very old. You have close contact with others, such as at work, school, or a health care facility.  You smoke. You have long-term (chronic) heart or lung disease. You have a weakened disease-fighting system (immune system). You have nasal allergies or asthma. You are experiencing a lot of stress. You have poor nutrition. What are the signs or symptoms? A URI usually involves some of the following symptoms: Runny or stuffy (congested) nose. Cough. Sneezing. Sore throat. Headache. Fatigue. Fever. Loss of appetite. Pain in your forehead, behind your eyes, and over your cheekbones (sinus pain). Muscle aches. Redness or irritation of the eyes. Pressure in the ears or face. How is this diagnosed? This condition may be diagnosed based on your medical history and symptoms, and a physical exam. Your health care provider may use a swab to take a mucus sample from your nose (nasal swab). This sample can be tested to determine what virus is causing the illness. How is this treated? URIs usually get better on their own within 7-10 days. Medicines cannot cure URIs, but your health care provider may recommend certain medicines to help relieve symptoms, such as: Over-the-counter cold medicines. Cough suppressants. Coughing is a type of defense against infection that helps to clear the respiratory system, so take these medicines only as recommended by your health care provider. Fever-reducing medicines. Follow these instructions at home: Activity Rest as needed. If you have a fever, stay home from work or school until your  fever is gone or until your health care provider says your URI cannot spread to other people (is no longer contagious). Your health care provider may have you wear a face mask to prevent your infection from spreading. Relieving symptoms Gargle with a mixture of salt and water 3-4 times a day or as needed. To make salt water, completely dissolve -1 tsp (3-6 g) of salt in 1 cup (237 mL) of warm water. Use a cool-mist humidifier to add moisture to the air. This can help you breathe more easily. Eating and drinking  Drink enough fluid to keep your urine pale yellow. Eat soups and other clear broths. General instructions  Take over-the-counter and prescription medicines only as told by your health care provider. These include cold medicines, fever reducers, and cough suppressants. Do not use any products that contain nicotine or tobacco. These products include cigarettes, chewing tobacco, and vaping devices, such as e-cigarettes. If you need help quitting, ask your health care provider. Stay away from secondhand smoke. Stay up to date on all immunizations, including the yearly (annual) flu vaccine. Keep all follow-up visits. This is important. How to prevent the spread of infection to others URIs can be contagious. To prevent the infection from spreading: Wash your hands with soap and water for at least 20 seconds. If soap and water are not available, use hand sanitizer. Avoid touching your mouth, face, eyes, or nose. Cough or sneeze into a tissue or your sleeve or elbow instead of into your hand or into the air.  Contact a health care provider if: You are getting worse instead of better. You have a fever or chills. Your mucus is brown or red. You have yellow or brown discharge coming from your nose. You have pain in your face, especially when you bend forward. You have swollen neck glands. You have pain while swallowing. You have white areas in the back of your throat. Get help right away  if: You have shortness of breath that gets worse. You have severe or persistent: Headache. Ear pain. Sinus pain. Chest pain. You have chronic lung disease along with any of the following: Making  high-pitched whistling sounds when you breathe, most often when you breathe out (wheezing). Prolonged cough (more than 14 days). Coughing up blood. A change in your usual mucus. You have a stiff neck. You have changes in your: Vision. Hearing. Thinking. Mood. These symptoms may be an emergency. Get help right away. Call 911. Do not wait to see if the symptoms will go away. Do not drive yourself to the hospital. Summary An upper respiratory infection (URI) is a common infection of the nose, throat, and upper air passages that lead to the lungs. A URI is caused by a virus. URIs usually get better on their own within 7-10 days. Medicines cannot cure URIs, but your health care provider may recommend certain medicines to help relieve symptoms. This information is not intended to replace advice given to you by your health care provider. Make sure you discuss any questions you have with your health care provider. Document Revised: 09/29/2020 Document Reviewed: 09/29/2020 Elsevier Patient Education  2024 Elsevier Inc.   If you have been instructed to have an in-person evaluation today at a local Urgent Care facility, please use the link below. It will take you to a list of all of our available [place] [facility], including address, phone number and hours of operation. Please do not delay care.  [facility]  If you or a family member do not have a primary care provider, use the link below to schedule a visit and establish care. When you choose a [facility] primary care physician or advanced practice provider, you gain a long-term partner in health. Find a Primary Care Provider  Learn more about [facility] [facility]'s in-office and virtual care options: [place] - Get Care Now

## 2024-02-17 NOTE — Progress Notes (Signed)
 Virtual Visit Consent   Raymond Lutz, you are scheduled for a virtual visit with a [facility] provider today. Just as with appointments in the office, your consent must be obtained to participate. Your consent will be active for this visit and any virtual visit you may have with one of our providers in the next 365 days. If you have a MyChart account, a copy of this consent can be sent to you electronically.  As this is a virtual visit, video technology does not allow for your provider to perform a traditional examination. This may limit your provider's ability to fully assess your condition. If your provider identifies any concerns that need to be evaluated in person or the need to arrange testing (such as labs, EKG, etc.), we will make arrangements to do so. Although advances in technology are sophisticated, we cannot ensure that it will always work on either your end or our end. If the connection with a video visit is poor, the visit may have to be switched to a telephone visit. With either a video or telephone visit, we are not always able to ensure that we have a secure connection.  By engaging in this virtual visit, you consent to the provision of healthcare and authorize for your insurance to be billed (if applicable) for the services provided during this visit. Depending on your insurance coverage, you may receive a charge related to this service.  I need to obtain your verbal consent now. Are you willing to proceed with your visit today? Raymond Lutz has provided verbal consent on 02/17/2024 for a virtual visit (video or telephone). Kirk GORMAN Sage, PA-C  Date: 02/17/2024 9:24 AM   Virtual Visit via Video Note   I, Raymond Lutz, connected with  Raymond Lutz  ([id number], 1966/01/28) on 02/17/24 at  9:15 AM EST by a video-enabled telemedicine application and verified that I am speaking with the correct person using two identifiers.  Location: Patient: Virtual Visit Location  Patient: Home Provider: Virtual Visit Location Provider: Home Office   I discussed the limitations of evaluation and management by telemedicine and the availability of in person appointments. The patient expressed understanding and agreed to proceed.    History of Present Illness: Raymond Lutz is a 58 y.o. who identifies as a male who was assigned male at birth, and is being seen today  with cough, phlegm, and sore throat.  He began feeling ill yesterday with cough producing brownish plugs, sore throat, and intermittent voice loss. Throat pain was worse this morning and over-the-counter medications have not helped.  He had two recent negative COVID tests. He has wheezing and uses his inhaler and nebulizer with partial relief. He has not taken antibiotics in the last 30 days.  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  He reports a low-grade fever with a maximum temperature of 100F and takes Tylenol . Throat pain makes eating and drinking uncomfortable.   Problems:  Patient Active Problem List   Diagnosis Date Noted   Gross hematuria 06/25/2023   Flank pain 06/25/2023   History of kidney stones 06/25/2023   Chronic anticoagulation 06/25/2023   Lower abdominal pain 06/25/2023   Hematospermia 06/25/2023   History of diverticulitis 11/23/2022   Benign neoplasm of colon 11/23/2022   Atrial fibrillation (HCC)    Hypogonadism in male 03/24/2021   Situational anxiety 03/24/2021   FH: cerebral aneurysm 01/20/2021   Erectile dysfunction 01/20/2021   Controlled  diabetes mellitus type 2 with complications (HCC) 11/20/2019   Pure hypercholesterolemia    Stented coronary artery 09/15/2019   Arthritis 09/15/2019   Asthma 07/10/2018   Kidney stone on left side 12/27/2016   OSA (obstructive sleep apnea) 12/18/2015   NSTEMI (non-ST elevated myocardial infarction) (HCC) 12/16/2015   Carotid bruit 05/17/2011   CAD (coronary artery disease)  05/04/2011   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   GERD (gastroesophageal reflux disease)    Hyperlipidemia LDL goal <70 10/17/2006   Morbid obesity (HCC) 10/17/2006   Essential hypertension 10/17/2006    Allergies: No Known Allergies Medications:  Current Outpatient Medications:    azithromycin  (ZITHROMAX ) 250 MG tablet, Take 2 tablets on day 1, then 1 tablet daily on days 2 through 5, Disp: 6 tablet, Rfl: 0   benzonatate  (TESSALON ) 100 MG capsule, Take 1-2 caps PO TID PRN, Disp: 20 capsule, Rfl: 0   predniSONE  (DELTASONE ) 20 MG tablet, Take 3 tablets (60 mg total) by mouth daily with breakfast for 2 days, THEN 2 tablets (40 mg total) daily with breakfast for 2 days, THEN 1 tablet (20 mg total) daily with breakfast for 2 days, THEN 0.5 tablets (10 mg total) daily with breakfast for 2 days., Disp: 13 tablet, Rfl: 0   albuterol  (VENTOLIN  HFA) 108 (90 Base) MCG/ACT inhaler, Inhale 2 puffs into the lungs every 4 (four) hours as needed for wheezing or shortness of breath., Disp: 18 g, Rfl: 0   amLODipine  (NORVASC ) 10 MG tablet, Take 1 tablet (10 mg total) by mouth daily., Disp: 90 tablet, Rfl: 3   apixaban  (ELIQUIS ) 5 MG TABS tablet, Take 1 tablet by mouth twice daily, Disp: 180 tablet, Rfl: 1   aspirin  EC 81 MG tablet, Take 1 tablet (81 mg total) by mouth daily., Disp: 90 tablet, Rfl: 3   atorvastatin  (LIPITOR ) 80 MG tablet, Take 1 tablet by mouth once daily, Disp: 90 tablet, Rfl: 0   budesonide (PULMICORT) 1 MG/2ML nebulizer solution, Take 1 mg by nebulization as needed (shortness of breath or wheezing)., Disp: , Rfl:    cetirizine  (ZYRTEC  ALLERGY) 10 MG tablet, Take 1 tablet (10 mg total) by mouth daily., Disp: 90 tablet, Rfl: 1   diclofenac Sodium (VOLTAREN) 1 % GEL, Apply 2 g topically daily as needed (pain)., Disp: , Rfl:    ezetimibe  (ZETIA ) 10 MG tablet, Take 1 tablet by mouth once daily, Disp: 90 tablet, Rfl: 0   fluticasone  (FLONASE ) 50 MCG/ACT nasal spray, Place 1 spray into both nostrils daily.  (Patient taking differently: Place 1 spray into both nostrils daily as needed for allergies.), Disp: 16 g, Rfl: 0   furosemide  (LASIX ) 40 MG tablet, Take 1 tablet by mouth twice daily, Disp: 180 tablet, Rfl: 0   glucose blood (FREESTYLE LITE) test strip, Use to test blood glucose 3 times daily, Disp: 100 each, Rfl: 1   isosorbide  mononitrate (IMDUR ) 30 MG 24 hr tablet, Take 1 tablet by mouth once daily, Disp: 90 tablet, Rfl: 0   loperamide  (IMODIUM  A-D) 2 MG tablet, Take 1 tablet (2 mg total) by mouth 4 (four) times daily as needed for diarrhea or loose stools., Disp: 30 tablet, Rfl: 0   LORazepam  (ATIVAN ) 0.5 MG tablet, Take 1 tablet (0.5 mg total) by mouth every 8 (eight) hours as needed for anxiety., Disp: 60 tablet, Rfl: 2   losartan  (COZAAR ) 100 MG tablet, Take 1 tablet (100 mg total) by mouth daily., Disp: 90 tablet, Rfl: 0   metFORMIN  (GLUCOPHAGE -XR) 500 MG 24 hr tablet,  Take 1 tablet (500 mg total) by mouth daily with breakfast., Disp: 90 tablet, Rfl: 3   metoprolol  tartrate (LOPRESSOR ) 50 MG tablet, Take 1 tablet (50 mg total) by mouth 2 (two) times daily., Disp: 180 tablet, Rfl: 3   nitroGLYCERIN  (NITROSTAT ) 0.4 MG SL tablet, DISSOLVE ONE TABLET UNDER THE TONGUE EVERY 5 MINUTES AS NEEDED FOR CHEST PAIN.  DO NOT EXCEED A TOTAL OF 3 DOSES IN 15 MINUTES, Disp: 75 tablet, Rfl: 1   NON FORMULARY, Pt uses a c pap nightly, Disp: , Rfl:    ondansetron  (ZOFRAN -ODT) 4 MG disintegrating tablet, Take 1 tablet (4 mg total) by mouth every 8 (eight) hours as needed., Disp: 20 tablet, Rfl: 0   Potassium Chloride  ER 20 MEQ TBCR, Take 2 tablets (40 mEq total) by mouth every morning., Disp: 180 tablet, Rfl: 0   scopolamine  (TRANSDERM SCOP , 1.5 MG,) 1 MG/3DAYS, Place 1 patch (1.5 mg total) onto the skin every 3 (three) days., Disp: 10 patch, Rfl: 2   Semaglutide ,0.25 or 0.5MG /DOS, (OZEMPIC , 0.25 OR 0.5 MG/DOSE,) 2 MG/3ML SOPN, INJECT 0.25 MG INTO THE SKIN ONCE A WEEK, Disp: 3 mL, Rfl: 5   tadalafil  (CIALIS ) 20  MG tablet, TAKE 1 TABLET BY MOUTH AS NEEDED FOR ERECTILE DYSFUNCTION, Disp: 10 tablet, Rfl: 11  Observations/Objective: Patient is well-developed, well-nourished in no acute distress.  Resting comfortably  at home.  Head is normocephalic, atraumatic.  No labored breathing.  Speech is clear and coherent with logical content.  Patient is alert and oriented at baseline.    Assessment and Plan: 1. Acute bacterial bronchitis (Primary) - azithromycin  (ZITHROMAX ) 250 MG tablet; Take 2 tablets on day 1, then 1 tablet daily on days 2 through 5  Dispense: 6 tablet; Refill: 0 - predniSONE  (DELTASONE ) 20 MG tablet; Take 3 tablets (60 mg total) by mouth daily with breakfast for 2 days, THEN 2 tablets (40 mg total) daily with breakfast for 2 days, THEN 1 tablet (20 mg total) daily with breakfast for 2 days, THEN 0.5 tablets (10 mg total) daily with breakfast for 2 days.  Dispense: 13 tablet; Refill: 0 - benzonatate  (TESSALON ) 100 MG capsule; Take 1-2 caps PO TID PRN  Dispense: 20 capsule; Refill: 0   - Prescribed 5-day antibiotics. - Initiated prednisone  taper for wheezing. - Prescribed cough medication. - Advised continued use of inhaler and nebulizer. - Provided work absence note. - Advised hydration    Follow Up Instructions: I discussed the assessment and treatment plan with the patient. The patient was provided an opportunity to ask questions and all were answered. The patient agreed with the plan and demonstrated an understanding of the instructions.  A copy of instructions were sent to the patient via MyChart unless otherwise noted below.     The patient was advised to call back or seek an in-person evaluation if the symptoms worsen or if the condition fails to improve as anticipated.    Kirk RAMAN Mayers, PA-C

## 2024-03-05 ENCOUNTER — Other Ambulatory Visit: Payer: Self-pay | Admitting: Cardiology

## 2024-03-05 DIAGNOSIS — I10 Essential (primary) hypertension: Secondary | ICD-10-CM

## 2024-03-05 DIAGNOSIS — Z760 Encounter for issue of repeat prescription: Secondary | ICD-10-CM

## 2024-03-06 ENCOUNTER — Other Ambulatory Visit: Payer: Self-pay | Admitting: Cardiology

## 2024-03-28 NOTE — Progress Notes (Signed)
 Raymond Lutz                                          MRN: [id number]   03/28/2024   The [facility] Quality Team Specialist reviewed this patient medical record for the purposes of chart review for care gap closure. The following were reviewed: chart review for care gap closure-controlling blood pressure.    [facility] Quality Team

## 2024-03-30 ENCOUNTER — Other Ambulatory Visit: Payer: Self-pay

## 2024-03-30 ENCOUNTER — Emergency Department (HOSPITAL_COMMUNITY)

## 2024-03-30 ENCOUNTER — Emergency Department (HOSPITAL_COMMUNITY)
Admission: EM | Admit: 2024-03-30 | Discharge: 2024-03-30 | Disposition: A | Discharge status: Home / Self Care | Attending: Emergency Medicine | Admitting: Emergency Medicine

## 2024-03-30 ENCOUNTER — Encounter (HOSPITAL_COMMUNITY): Payer: Self-pay

## 2024-03-30 DIAGNOSIS — M545 Low back pain, unspecified: Secondary | ICD-10-CM

## 2024-03-30 DIAGNOSIS — Z7982 Long term (current) use of aspirin: Secondary | ICD-10-CM | POA: Insufficient documentation

## 2024-03-30 DIAGNOSIS — M51379 Other intervertebral disc degeneration, lumbosacral region without mention of lumbar back pain or lower extremity pain: Secondary | ICD-10-CM | POA: Diagnosis not present

## 2024-03-30 DIAGNOSIS — Z7901 Long term (current) use of anticoagulants: Secondary | ICD-10-CM | POA: Diagnosis not present

## 2024-03-30 LAB — URINALYSIS, ROUTINE W REFLEX MICROSCOPIC
Bacteria, UA: NONE SEEN
Bilirubin Urine: NEGATIVE
Glucose, UA: NEGATIVE mg/dL
Hgb urine dipstick: NEGATIVE
Ketones, ur: NEGATIVE mg/dL
Leukocytes,Ua: NEGATIVE
Nitrite: NEGATIVE
Protein, ur: 30 mg/dL — AB
Specific Gravity, Urine: 1.027 (ref 1.005–1.030)
pH: 6 (ref 5.0–8.0)

## 2024-03-30 LAB — CBC
HCT: 40.4 % (ref 39.0–52.0)
Hemoglobin: 12.9 g/dL — ABNORMAL LOW (ref 13.0–17.0)
MCH: 27.9 pg (ref 26.0–34.0)
MCHC: 31.9 g/dL (ref 30.0–36.0)
MCV: 87.4 fL (ref 80.0–100.0)
Platelets: 256 K/uL (ref 150–400)
RBC: 4.62 MIL/uL (ref 4.22–5.81)
RDW: 14.5 % (ref 11.5–15.5)
WBC: 8.2 K/uL (ref 4.0–10.5)
nRBC: 0 % (ref 0.0–0.2)

## 2024-03-30 LAB — COMPREHENSIVE METABOLIC PANEL WITH GFR
ALT: 42 U/L (ref 0–44)
AST: 27 U/L (ref 15–41)
Albumin: 4.2 g/dL (ref 3.5–5.0)
Alkaline Phosphatase: 63 U/L (ref 38–126)
Anion gap: 9 (ref 5–15)
BUN: 22 mg/dL — ABNORMAL HIGH (ref 6–20)
CO2: 28 mmol/L (ref 22–32)
Calcium: 9.3 mg/dL (ref 8.9–10.3)
Chloride: 104 mmol/L (ref 98–111)
Creatinine, Ser: 1.04 mg/dL (ref 0.61–1.24)
GFR, Estimated: >60 mL/min
Glucose, Bld: 118 mg/dL — ABNORMAL HIGH (ref 70–99)
Potassium: 4.7 mmol/L (ref 3.5–5.1)
Sodium: 140 mmol/L (ref 135–145)
Total Bilirubin: 0.5 mg/dL (ref 0.0–1.2)
Total Protein: 7.1 g/dL (ref 6.5–8.1)

## 2024-03-30 LAB — LIPASE, BLOOD: Lipase: 46 U/L (ref 11–51)

## 2024-03-30 MED ORDER — METHOCARBAMOL 1000 MG/10ML IJ SOLN
1000.0000 mg | Freq: Once | INTRAMUSCULAR | Status: AC
  Administered 2024-03-30: 1000 mg via INTRAVENOUS
  Filled 2024-03-30: qty 10

## 2024-03-30 MED ORDER — FENTANYL CITRATE (PF) 50 MCG/ML IJ SOSY
50.0000 ug | PREFILLED_SYRINGE | Freq: Once | INTRAMUSCULAR | Status: AC
  Administered 2024-03-30: 50 ug via INTRAVENOUS
  Filled 2024-03-30: qty 1

## 2024-03-30 MED ORDER — PREDNISONE 10 MG (21) PO TBPK: ORAL_TABLET | Freq: Every day | ORAL | 0 refills | Status: AC

## 2024-03-30 MED ORDER — METHOCARBAMOL 500 MG PO TABS: 500.0000 mg | ORAL_TABLET | Freq: Two times a day (BID) | ORAL | 0 refills | Status: AC

## 2024-03-30 MED ORDER — OXYCODONE-ACETAMINOPHEN 5-325 MG PO TABS
1.0000 | ORAL_TABLET | Freq: Once | ORAL | Status: AC
  Administered 2024-03-30: 1 via ORAL
  Filled 2024-03-30: qty 1

## 2024-03-30 MED ORDER — METHYLPREDNISOLONE SODIUM SUCC 125 MG IJ SOLR
125.0000 mg | Freq: Once | INTRAMUSCULAR | Status: AC
  Administered 2024-03-30: 125 mg via INTRAVENOUS
  Filled 2024-03-30: qty 2

## 2024-03-30 NOTE — ED Notes (Signed)
 Awaiting pt from lobby

## 2024-03-30 NOTE — ED Provider Notes (Signed)
 " [place] [facility] AT [facility] Provider Note   CSN: [id number] Arrival date & time: 03/30/24  0840     Patient presents with: Back Pain   Raymond Lutz is a 59 y.o. male.   HPI 59 year old male presents today complaining of pain in his right back that radiates to his right flank and into his lower abdomen.  He describes some difficulty urinating.  He denies any blood in his urine, frequency of urine, fever, or chills.  He denies any numbness or tingling in his leg.  He denies any recent trauma.  He has not had similar symptoms in the past although he has had a kidney stone.  Is unclear whether this pain is similar to that kidney stone.  He describes the pain today as intermittently dull and aching and then sharp.  It seems to be triggered by movement.    Prior to Admission medications  Medication Sig Start Date End Date Taking? Authorizing Provider  methocarbamol  (ROBAXIN ) 500 MG tablet Take 1 tablet (500 mg total) by mouth 2 (two) times daily. 03/30/24  Yes Alaia Lordi, Edsel, MD  predniSONE  (STERAPRED UNI-PAK 21 TAB) 10 MG (21) TBPK tablet Take by mouth daily. Take 6 tabs by mouth daily  for 2 days, then 5 tabs for 2 days, then 4 tabs for 2 days, then 3 tabs for 2 days, 2 tabs for 2 days, then 1 tab by mouth daily for 2 days 03/30/24  Yes Levander Edsel, MD  albuterol  (VENTOLIN  HFA) 108 (90 Base) MCG/ACT inhaler Inhale 2 puffs into the lungs every 4 (four) hours as needed for wheezing or shortness of breath. 08/30/23   Johnny Garnette LABOR, MD  amLODipine  (NORVASC ) 10 MG tablet Take 1 tablet (10 mg total) by mouth daily. 02/06/24   Shlomo Wilbert SAUNDERS, MD  apixaban  (ELIQUIS ) 5 MG TABS tablet Take 1 tablet by mouth twice daily 11/22/23   Shlomo Wilbert SAUNDERS, MD  aspirin  EC 81 MG tablet Take 1 tablet (81 mg total) by mouth daily. 03/22/17   Shlomo Wilbert SAUNDERS, MD  atorvastatin  (LIPITOR ) 80 MG tablet TAKE 1 TABLET BY MOUTH ONCE DAILY . APPOINTMENT REQUIRED FOR FUTURE REFILLS IN  NOVEMBER   2025 03/07/24   Shlomo Wilbert SAUNDERS, MD  benzonatate  (TESSALON ) 100 MG capsule Take 1-2 caps PO TID PRN 02/17/24   Mayers, Cari S, PA-C  budesonide (PULMICORT) 1 MG/2ML nebulizer solution Take 1 mg by nebulization as needed (shortness of breath or wheezing). 03/11/20   [provider]  cetirizine  (ZYRTEC  ALLERGY) 10 MG tablet Take 1 tablet (10 mg total) by mouth daily. 06/17/23   Lavell Lye A, FNP  diclofenac Sodium (VOLTAREN) 1 % GEL Apply 2 g topically daily as needed (pain).    [provider]  ezetimibe  (ZETIA ) 10 MG tablet Take 1 tablet by mouth once daily 12/05/23   Shlomo Wilbert SAUNDERS, MD  fluticasone  (FLONASE ) 50 MCG/ACT nasal spray Place 1 spray into both nostrils daily. Patient taking differently: Place 1 spray into both nostrils daily as needed for allergies. 06/28/20   Mercer Clotilda SAUNDERS, MD  furosemide  (LASIX ) 40 MG tablet Take 1 tablet (40 mg total) by mouth 2 (two) times daily. 02/20/24   Shlomo Wilbert SAUNDERS, MD  glucose blood (FREESTYLE LITE) test strip Use to test blood glucose 3 times daily 06/06/23   Johnny Garnette LABOR, MD  isosorbide  mononitrate (IMDUR ) 30 MG 24 hr tablet Take 1 tablet (30 mg total) by mouth daily. 03/05/24   Turner,  Wilbert SAUNDERS, MD  loperamide  (IMODIUM  A-D) 2 MG tablet Take 1 tablet (2 mg total) by mouth 4 (four) times daily as needed for diarrhea or loose stools. 01/21/24   Vivienne Delon HERO, PA-C  LORazepam  (ATIVAN ) 0.5 MG tablet Take 1 tablet (0.5 mg total) by mouth every 8 (eight) hours as needed for anxiety. 05/10/23   Johnny Garnette LABOR, MD  losartan  (COZAAR ) 100 MG tablet Take 1 tablet (100 mg total) by mouth daily. 03/05/24   Shlomo Wilbert SAUNDERS, MD  metFORMIN  (GLUCOPHAGE -XR) 500 MG 24 hr tablet Take 1 tablet (500 mg total) by mouth daily with breakfast. 05/10/23   Johnny Garnette LABOR, MD  metoprolol  tartrate (LOPRESSOR ) 50 MG tablet Take 1 tablet (50 mg total) by mouth 2 (two) times daily. 01/24/24   Shlomo Wilbert SAUNDERS, MD  nitroGLYCERIN  (NITROSTAT ) 0.4 MG SL tablet DISSOLVE  ONE TABLET UNDER THE TONGUE EVERY 5 MINUTES AS NEEDED FOR CHEST PAIN.  DO NOT EXCEED A TOTAL OF 3 DOSES IN 15 MINUTES 07/17/23   Turner, Wilbert SAUNDERS, MD  NON FORMULARY Pt uses a c pap nightly    [provider]  ondansetron  (ZOFRAN -ODT) 4 MG disintegrating tablet Take 1 tablet (4 mg total) by mouth every 8 (eight) hours as needed. 01/21/24   Vivienne Delon HERO, PA-C  Potassium Chloride  ER 20 MEQ TBCR Take 2 tablets (40 mEq total) by mouth every morning. 03/05/24   Shlomo Wilbert SAUNDERS, MD  scopolamine  (TRANSDERM SCOP , 1.5 MG,) 1 MG/3DAYS Place 1 patch (1.5 mg total) onto the skin every 3 (three) days. 05/10/23   Johnny Garnette LABOR, MD  Semaglutide ,0.25 or 0.5MG /DOS, (OZEMPIC , 0.25 OR 0.5 MG/DOSE,) 2 MG/3ML SOPN INJECT 0.25 MG INTO THE SKIN ONCE A WEEK 12/12/23   Johnny Garnette LABOR, MD  tadalafil  (CIALIS ) 20 MG tablet TAKE 1 TABLET BY MOUTH AS NEEDED FOR ERECTILE DYSFUNCTION 05/14/23   Johnny Garnette LABOR, MD    Allergies: Patient has no known allergies.    Review of Systems  Updated Vital Signs BP (!) 140/78 (BP Location: Right Arm)   Pulse (!) 53   Temp 97.8 F (36.6 C) (Oral)   Resp 20   SpO2 100%   Physical Exam Vitals and nursing note reviewed.  Constitutional:      Appearance: Normal appearance. He is obese.  HENT:     Head: Normocephalic.     Right Ear: External ear normal.     Left Ear: External ear normal.     Nose: Nose normal.     Mouth/Throat:     Pharynx: Oropharynx is clear.  Eyes:     Pupils: Pupils are equal, round, and reactive to light.  Cardiovascular:     Rate and Rhythm: Normal rate and regular rhythm.     Pulses: Normal pulses.  Pulmonary:     Effort: Pulmonary effort is normal.     Breath sounds: Normal breath sounds.  Abdominal:     General: Abdomen is flat.     Palpations: Abdomen is soft.     Comments: Mild tenderness to right flank No CVA tenderness noted  Musculoskeletal:     Cervical back: Normal range of motion.     Comments: Some mild diffuse low back  tenderness to the right.  No point tenderness over the lumbar spine  Skin:    General: Skin is warm and dry.     Capillary Refill: Capillary refill takes less than 2 seconds.  Neurological:     General: No focal deficit present.  Mental Status: He is alert.     Cranial Nerves: No cranial nerve deficit.     Sensory: No sensory deficit.     Motor: No weakness.     Coordination: Coordination normal.  Psychiatric:        Mood and Affect: Mood normal.     (all labs ordered are listed, but only abnormal results are displayed) Labs Reviewed  URINALYSIS, ROUTINE W REFLEX MICROSCOPIC - Abnormal; Notable for the following components:      Result Value   Protein, ur 30 (*)    All other components within normal limits  CBC - Abnormal; Notable for the following components:   Hemoglobin 12.9 (*)    All other components within normal limits  COMPREHENSIVE METABOLIC PANEL WITH GFR - Abnormal; Notable for the following components:   Glucose, Bld 118 (*)    BUN 22 (*)    All other components within normal limits  LIPASE, BLOOD    EKG: None  Radiology: CT Renal Stone Study Result Date: 03/30/2024 EXAM: CT ABDOMEN AND PELVIS WITHOUT CONTRAST 03/30/2024 11:33:12 AM TECHNIQUE: CT of the abdomen and pelvis was performed without the administration of intravenous contrast. Multiplanar reformatted images are provided for review. Automated exposure control, iterative reconstruction, and/or weight-based adjustment of the mA/kV was utilized to reduce the radiation dose to as low as reasonably achievable. COMPARISON: CT of the abdomen and pelvis dated 06/26/2023. CLINICAL HISTORY: Abdominal/flank pain, stone suspected. FINDINGS: LOWER CHEST: No acute abnormality. LIVER: The liver is unremarkable. GALLBLADDER AND BILE DUCTS: Gallbladder is unremarkable. No biliary ductal dilatation. SPLEEN: No acute abnormality. PANCREAS: No acute abnormality. ADRENAL GLANDS: No acute abnormality. KIDNEYS, URETERS AND  BLADDER: There is no evidence of nephrolithiasis or obstructive uropathy. There is a round simple cyst arising from the lower pole of the right kidney, measuring approximately 10 mm in diameter. Per consensus, no follow-up is needed for simple Bosniak type 1 and 2 renal cysts, unless the patient has a malignancy history or risk factors. No perinephric or periureteral stranding. Urinary bladder is unremarkable. GI AND BOWEL: Stomach demonstrates no acute abnormality. There are few scattered colonic diverticula present. There is no evidence of diverticulitis. The bowel is otherwise unremarkable, including the appendix. There is no bowel obstruction. PERITONEUM AND RETROPERITONEUM: No ascites. No free air. VASCULATURE: Aorta is normal in caliber. There is mild calcific atheromatous disease within the aortoiliac arteries. LYMPH NODES: No lymphadenopathy. REPRODUCTIVE ORGANS: No acute abnormality. BONES AND SOFT TISSUES: There is mild levoscoliosis and moderate diffuse degenerative disc disease and facet arthrosis throughout the lumbar spine. There is a small periumbilical fat-containing hernia. No acute osseous abnormality. No focal soft tissue abnormality. IMPRESSION: 1. No evidence of nephrolithiasis or obstructive uropathy. 2. 10 mm simple cyst at the lower pole of the right kidney. No follow-up imaging is recommended. 3. Few scattered colonic diverticula without evidence of diverticulitis. 4. Small periumbilical fat-containing hernia. 5. Mild calcific atheromatous disease within the aortoiliac arteries. Electronically signed by: Evalene Coho MD 03/30/2024 12:18 PM EST RP Workstation: [id number]   CT L-SPINE NO CHARGE Result Date: 03/30/2024 EXAM: CT OF THE LUMBAR SPINE WITHOUT CONTRAST 03/30/2024 11:33:12 AM TECHNIQUE: CT of the lumbar spine was performed without the administration of intravenous contrast. Multiplanar reformatted images are provided for review. Automated exposure control, iterative  reconstruction, and/or weight based adjustment of the mA/kV was utilized to reduce the radiation dose to as low as reasonably achievable. COMPARISON: None available. CLINICAL HISTORY: intermittent low back pain that radiates into his abdomen.  FINDINGS: BONES AND ALIGNMENT: Normal vertebral body heights. No acute fracture or suspicious bone lesion. Straightening of the normal lumbar lordosis is present. DEGENERATIVE CHANGES: T12-L1: Vacuum disc is present. Facet hypertrophy is noted bilaterally without significant stenosis. L1-L2: A broad-based disc protrusion is asymmetric to the left. Mild facet hypertrophy is noted bilaterally. Mild left subarticular and foraminal narrowing is present. L2-L3: Mild facet hypertrophy is present bilaterally without significant disc protrusion or stenosis. L3-L4: Vacuum disc is present. Mild disc bulging and moderate bilateral facet hypertrophy is present. Moderate foraminal stenosis is present bilaterally. L4-L5: A right paramedian disc protrusion effaces ventral CSF. Severe right and moderate left foraminal narrowing is present. L5-S1: A broad-based disc protrusion and moderate bilateral facet hypertrophy is present. Moderate left foraminal stenosis is present. SOFT TISSUES: Minimal atherosclerotic changes are present in the aorta and branch vessels. No aneurysm is present. No acute abnormality. IMPRESSION: 1. Multilevel degenerative changes, most severe at L4-L5 with right paramedian disc protrusion effacing ventral CSF and severe right/moderate left foraminal narrowing 2. L5-S1 broad-based disc protrusion, moderate bilateral facet hypertrophy, and moderate left foraminal stenosis. Electronically signed by: Lonni Necessary MD 03/30/2024 12:14 PM EST RP Workstation: [id number]     Procedures   Medications Ordered in the ED  oxyCODONE -acetaminophen  (PERCOCET/ROXICET) 5-325 MG per tablet 1 tablet (1 tablet Oral Given 03/30/24 0903)  fentaNYL  (SUBLIMAZE ) injection 50 mcg (50  mcg Intravenous Given 03/30/24 1047)  methylPREDNISolone  sodium succinate (SOLU-MEDROL ) 125 mg/2 mL injection 125 mg (125 mg Intravenous Given 03/30/24 1249)  methocarbamol  (ROBAXIN ) injection 1,000 mg (1,000 mg Intravenous Given 03/30/24 1249)    Clinical Course as of 03/30/24 1403  Sun Mar 30, 2024  1238 CT stone study with no evidence of nephrolithiasis or obstructive uropathy with a 10 mm cyst of the lower pole of the right kidney no follow-up imaging is recommended, few scattered colonic diverticuli without acute diverticulitis, small periumbilical fat-containing hernia, mild calcific atheromatous disease within the aortic iliac series [DR]  1239 CT of spine shows multilevel degenerative diseases most severe at L4-L5 with right paramedian disc protrusion effacing ventral CSF and severe right moderate left foraminal narrowing with L5-S1 broad-based disc protrusion moderate bilateral facet hypertrophy and moderate left foraminal stenosis [DR]    Clinical Course User Index [DR] Levander Houston, MD                                 Medical Decision Making Amount and/or Complexity of Data Reviewed Labs: ordered. Radiology: ordered.  Risk Prescription drug management.   59 year old male complaining of right low back pain rating to right flank.  Differential diagnosis includes but is not limited to musculoskeletal back pain, other etiologies of flank pain including renal colic, intra-abdominal etiologies, lumbar disc disease, aortic dissection. Patient evaluated here with CT scan without contrast and reconstruction of lumbar spine. Labs without acute abnormalities. Urine with 0-5 red blood cells 0-5 blood white blood cells doubt UTI. CT scan shows no evidence of acute intra-abdominal etiology including no evidence of renal colic or stones. CT of the spine does show some degenerative disc disease particularly at L4-L5 with right paramedian disc protrusion.  This is effacing the ventral CSF with  severe right moderate left foraminal narrowing.  Patient is neurologically and vascularly intact.  He has no red flag signs including no loss of bowel or bladder control, weakness in his lower extremities, and has no decreased sensation. Patient is treated here with prednisone   and Robaxin .  He feels improved with occasional sharp pains. Patient will be treated outpatient with prednisone  and Robaxin . He is advised of red flag precautions to return.  He is advised to follow-up with his primary care physician.  Plan note to be off the next 2 days.      Final diagnoses:  Acute right-sided low back pain, unspecified whether sciatica present  Degenerative disc disease at L5-S1 level    ED Discharge Orders          Ordered    predniSONE  (STERAPRED UNI-PAK 21 TAB) 10 MG (21) TBPK tablet  Daily        03/30/24 1402    methocarbamol  (ROBAXIN ) 500 MG tablet  2 times daily        03/30/24 1402               Levander Houston, MD 03/30/24 1403  "

## 2024-03-30 NOTE — Discharge Instructions (Signed)
 Please take medications as needed for pain.  Please follow-up with your primary care doctor in the next several days. Return if you are having new or worsening symptoms, specifically if you are having any weakness in your leg, numbness in the upper inner leg, loss of bowel or bladder control.

## 2024-03-30 NOTE — ED Triage Notes (Signed)
 In addition to initial triage note, pt states pain radiates across his right flank and into lower abdomen, difficulty urinating, and some nausea. Pt AxOx4.

## 2024-03-30 NOTE — ED Triage Notes (Signed)
 Pt bib pov c/o lower back pain that radiates to his lower abdomen that is intermittent.

## 2024-03-31 ENCOUNTER — Other Ambulatory Visit: Payer: Self-pay | Admitting: Cardiology

## 2024-03-31 NOTE — Telephone Encounter (Signed)
 Lipid Panel within 12 months 07/05/23

## 2024-04-03 ENCOUNTER — Encounter: Payer: Self-pay | Admitting: Family Medicine

## 2024-04-03 ENCOUNTER — Ambulatory Visit: Admitting: Family Medicine

## 2024-04-03 VITALS — BP 158/96 | HR 66 | Temp 97.6°F | Wt 378.0 lb

## 2024-04-03 DIAGNOSIS — M545 Low back pain, unspecified: Secondary | ICD-10-CM

## 2024-04-03 MED ORDER — TRAMADOL HCL 50 MG PO TABS: 100.0000 mg | ORAL_TABLET | Freq: Three times a day (TID) | ORAL | 1 refills | Status: AC | PRN

## 2024-04-03 NOTE — Progress Notes (Signed)
" ° °  Subjective:    Patient ID: Raymond Lutz, male    DOB: 15-Apr-1965, 59 y.o.   MRN: [id number]  HPI Here to follow up on an ED visit on 03-31-23 for the sudden onset of sharp pain in the right lower back and right flank. He has no hx of back trouble. He thinks this was started when he stepped off a curb without looking, and he came down on his right foot with all his body weight. Shortly after that his lower back tightened up and the pain started. At the ED a renal CT showed no kidney stones. A lumbar spine CT showed a herniated disc at L4-5 with severe foraminal narrowing on the right side. He was started on Prednisone   and Robaxin  in addition to Tylenol . Since then he has not improved at all. The pain is still present, but it does not radiate to the legs. He has gone back to work, but this has been very difficult. This involves a lot of walking.    Review of Systems  Constitutional: Negative.   Respiratory: Negative.    Cardiovascular: Negative.   Musculoskeletal:  Positive for back pain.       Objective:   Physical Exam Constitutional:      Appearance: He is obese.     Comments: In pain   Cardiovascular:     Rate and Rhythm: Normal rate and regular rhythm.     Pulses: Normal pulses.     Heart sounds: Normal heart sounds.  Pulmonary:     Effort: Pulmonary effort is normal.     Breath sounds: Normal breath sounds.  Musculoskeletal:     Comments: He is quite tender in the lower back over the spine and to the right side. No sciatic notch tenderness. ROM is limited by pain   Neurological:     Mental Status: He is alert.           Assessment & Plan:  He has right sided low back pain from a herniated disc. He will finish the Prednisone  taper and continue on Robaxin . He will use Tylenol  during the day for pain and he can use Tramadol  at night. We wrote a note for his work to avoid lifting over 20 lbs. We wil also refer him to PT. He will follow up as needed. I personally spent a  total of 33 minutes in the care of the patient today including getting/reviewing separately obtained history, performing a medically appropriate exam/evaluation, counseling and educating, and placing orders.  Garnette Olmsted, MD    "

## 2024-04-07 ENCOUNTER — Telehealth: Payer: Self-pay

## 2024-04-07 ENCOUNTER — Other Ambulatory Visit (HOSPITAL_COMMUNITY): Payer: Self-pay

## 2024-04-07 NOTE — Telephone Encounter (Signed)
 Pharmacy Patient Advocate Encounter   Received notification from Onbase CMM KEY that prior authorization for traMADol  (ULTRAM ) 50 MG tablet  is required/requested.   Insurance verification completed.   The patient is insured through [employer].   Per test claim: PA required; PA submitted to above mentioned insurance via Latent Key/confirmation #/EOC [id number] Status is pending

## 2024-04-14 NOTE — Telephone Encounter (Signed)
 Pharmacy Patient Advocate Encounter  Received notification from [facility] [employer] that Prior Authorization for traMADol  HCl 50MG  tablets  has been DENIED.  See denial reason below. No denial letter attached in CMM. Will attach denial letter to Media tab once received.  We denied your request because we did not see certain details about your illness and treatment. We see that this request is for a drug called tramadol  50 milligram tablet for your illness (acute low back pain, unspecified). We may consider approval of the quantity requested (more than a 7 day supply per fill or more than a 14 day supply per 30 days, and, for an amount higher than the health plans quantity limit per day) in a certain situation [for more than a 7 day supply per fill or more than a 14 day supply per 30 days: for those with a diagnosis of cancer related pain and/or is actively undergoing cancer treatment; or, for those with a terminal condition and are receiving palliative/end-of-life care; or, for those with a diagnosis of sickle cell anemia; or, for those who are currently utilizing opioid therapy on a consistent basis for chronic pain and your prescriber. Tramadol  quantity exceeds 7-day supply limit for acute low back pain; requires cancer, terminal illness, sickle cell, or chronic pain with documented non-opioid analgesic failure  PA #/Case ID/Reference #: [id number]

## 2024-04-15 NOTE — Telephone Encounter (Signed)
 I see his insurance will only cover 7 days of pain medication. Since his back is still bothering him and we know he has some pinched nerves in his back, I would like to refer him to a Neurosurgeon to evaluate this if he agrees. In the meantime he can take up to 3000 mg of Tylenol  a day as needed

## 2024-04-16 ENCOUNTER — Ambulatory Visit: Admitting: Physical Therapy

## 2024-04-16 ENCOUNTER — Encounter: Payer: Self-pay | Admitting: Physical Therapy

## 2024-04-16 ENCOUNTER — Other Ambulatory Visit: Payer: Self-pay

## 2024-04-16 DIAGNOSIS — M6281 Muscle weakness (generalized): Secondary | ICD-10-CM

## 2024-04-16 DIAGNOSIS — M5459 Other low back pain: Secondary | ICD-10-CM

## 2024-04-23 ENCOUNTER — Ambulatory Visit: Admitting: Physical Therapy

## 2024-04-24 ENCOUNTER — Ambulatory Visit

## 2024-04-29 ENCOUNTER — Ambulatory Visit: Admitting: Physical Therapy

## 2024-05-01 ENCOUNTER — Ambulatory Visit: Admitting: Physical Therapy

## 2024-05-07 ENCOUNTER — Ambulatory Visit: Admitting: Physical Therapy

## 2024-05-08 ENCOUNTER — Ambulatory Visit: Admitting: Physical Therapy

## 2024-05-13 ENCOUNTER — Ambulatory Visit

## 2024-05-15 ENCOUNTER — Ambulatory Visit: Admitting: Physical Therapy
